# Patient Record
Sex: Female | Born: 1955 | Race: White | Hispanic: No | Marital: Single | Smoking: Never smoker
Health system: Southern US, Community
[De-identification: ages and names within clinical notes are randomized; demographics above are authoritative.]

## PROBLEM LIST (undated history)

## (undated) DIAGNOSIS — M2550 Pain in unspecified joint: Secondary | ICD-10-CM

## (undated) DIAGNOSIS — E039 Hypothyroidism, unspecified: Secondary | ICD-10-CM

## (undated) DIAGNOSIS — E7800 Pure hypercholesterolemia, unspecified: Secondary | ICD-10-CM

## (undated) HISTORY — PX: LASIK: SHX215

## (undated) HISTORY — PX: COLONOSCOPY: SHX174

## (undated) HISTORY — PX: BREAST BIOPSY: SHX20

---

## 1998-06-20 ENCOUNTER — Other Ambulatory Visit: Admission: RE | Admit: 1998-06-20 | Discharge: 1998-06-20 | Payer: Self-pay | Admitting: Obstetrics and Gynecology

## 2000-08-18 ENCOUNTER — Other Ambulatory Visit: Admission: RE | Admit: 2000-08-18 | Discharge: 2000-08-18 | Payer: Self-pay | Admitting: Obstetrics and Gynecology

## 2001-10-31 ENCOUNTER — Other Ambulatory Visit: Admission: RE | Admit: 2001-10-31 | Discharge: 2001-10-31 | Payer: Self-pay | Admitting: Obstetrics and Gynecology

## 2002-01-18 ENCOUNTER — Other Ambulatory Visit: Admission: RE | Admit: 2002-01-18 | Discharge: 2002-01-18 | Payer: Self-pay | Admitting: Obstetrics and Gynecology

## 2003-06-21 ENCOUNTER — Other Ambulatory Visit: Admission: RE | Admit: 2003-06-21 | Discharge: 2003-06-21 | Payer: Self-pay | Admitting: Obstetrics and Gynecology

## 2003-11-08 ENCOUNTER — Other Ambulatory Visit: Admission: RE | Admit: 2003-11-08 | Discharge: 2003-11-08 | Payer: Self-pay | Admitting: Obstetrics and Gynecology

## 2004-12-16 ENCOUNTER — Other Ambulatory Visit: Admission: RE | Admit: 2004-12-16 | Discharge: 2004-12-16 | Payer: Self-pay | Admitting: Obstetrics and Gynecology

## 2011-02-03 ENCOUNTER — Other Ambulatory Visit: Payer: Self-pay | Admitting: Obstetrics and Gynecology

## 2011-02-03 DIAGNOSIS — R928 Other abnormal and inconclusive findings on diagnostic imaging of breast: Secondary | ICD-10-CM

## 2011-02-11 ENCOUNTER — Ambulatory Visit
Admission: RE | Admit: 2011-02-11 | Discharge: 2011-02-11 | Disposition: A | Discharge status: Home / Self Care | Payer: BC Managed Care – PPO | Source: Ambulatory Visit | Attending: Obstetrics and Gynecology | Admitting: Obstetrics and Gynecology

## 2011-02-11 DIAGNOSIS — R928 Other abnormal and inconclusive findings on diagnostic imaging of breast: Secondary | ICD-10-CM

## 2011-02-12 IMAGING — MG MM DIGITAL DIAGNOSTIC UNILAT*R*
2 series · 2 of 2 positions shown · non-contrast
Comparison: [DATE], [DATE], [DATE].

CLINICAL DATA: Called back from screening mammography, possible
right breast mass visualized only on the CC view.

DIGITAL DIAGNOSTIC RIGHT MAMMOGRAM WITH CAD

[R CC]
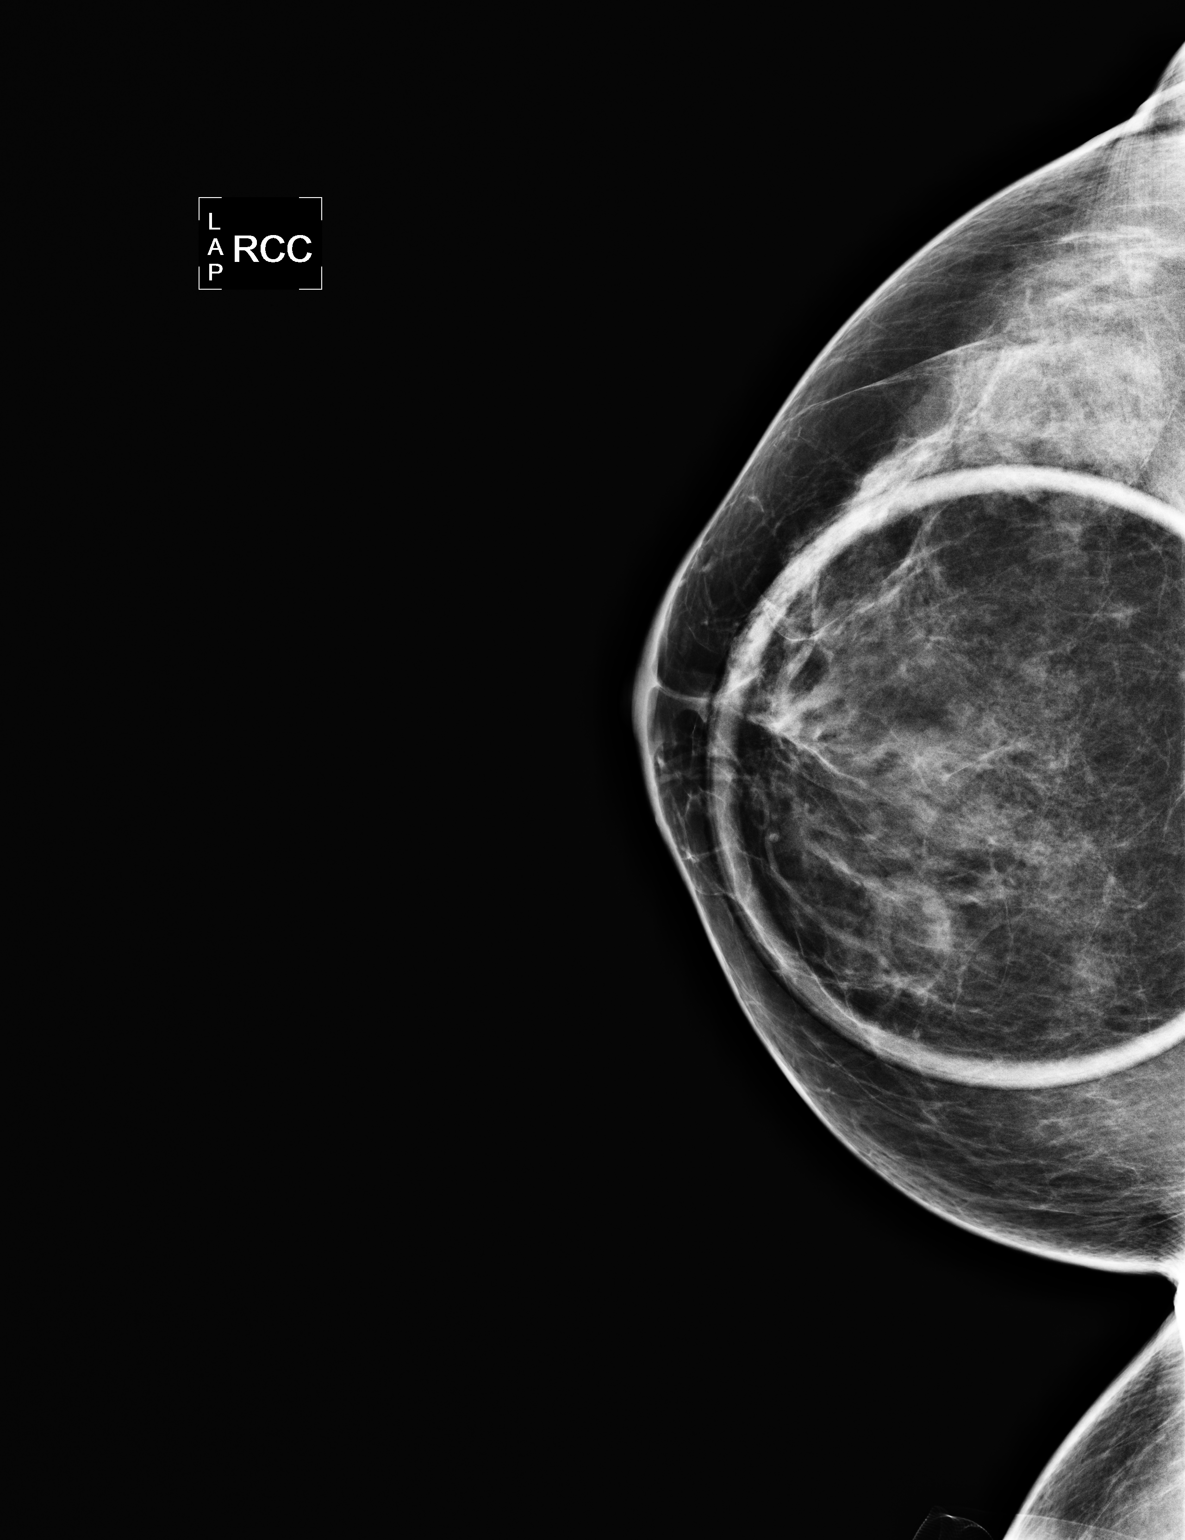

[R ML]
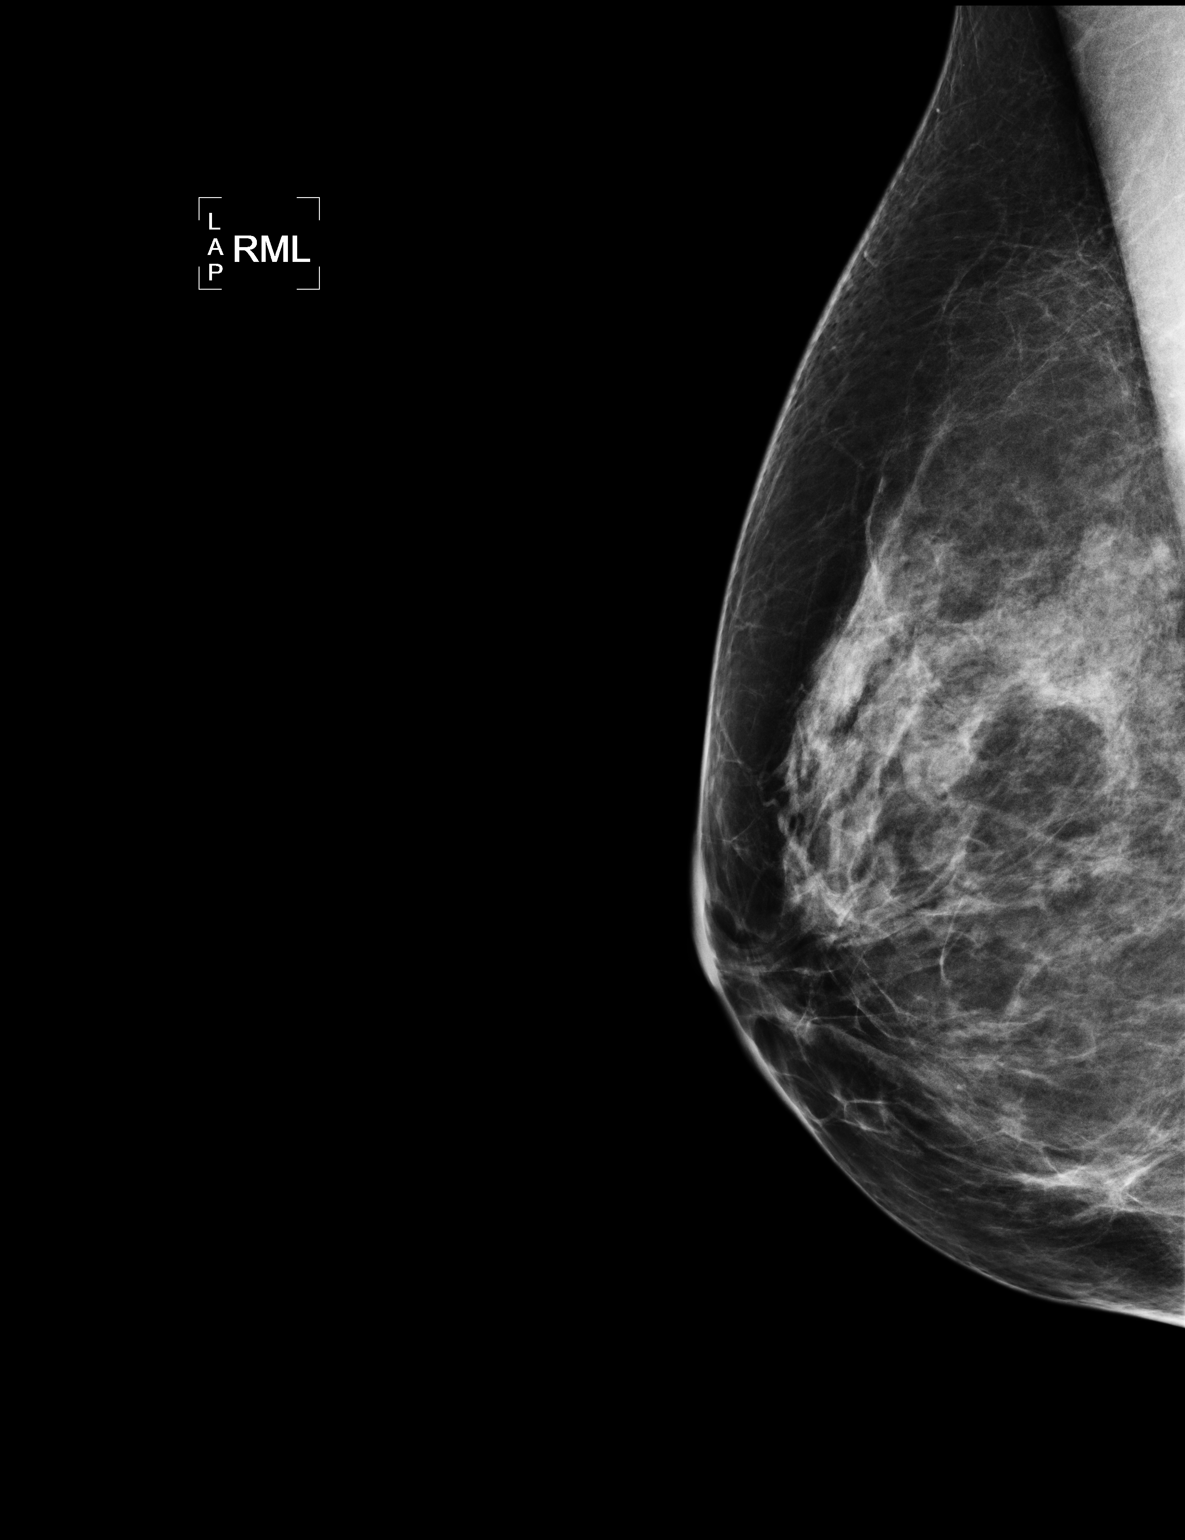

[2 of 2 positions shown; findings below may reference images not displayed]

FINDINGS: Spot compression CC view and a true lateral view of the
right breast were obtained.  Scattered fibroglandular parenchymal
pattern.  No suspicious mass, nonsurgical architectural distortion,
or suspicious calcifications in the right breast.
Mammographic images were processed with CAD.
IMPRESSION: No mammographic evidence of malignancy, right breast.  The
abnormality questioned on the screening mammogram is consistent
with a summation of overlapping fibroglandular tissue.

Recommendation:  Annual bilateral screening mammography in 1 year,
[DATE].

BI-RADS CATEGORY 1:  Negative.

## 2011-11-23 ENCOUNTER — Other Ambulatory Visit: Payer: Self-pay | Admitting: Obstetrics and Gynecology

## 2011-11-23 DIAGNOSIS — Z1231 Encounter for screening mammogram for malignant neoplasm of breast: Secondary | ICD-10-CM

## 2012-01-28 ENCOUNTER — Ambulatory Visit
Admission: RE | Admit: 2012-01-28 | Discharge: 2012-01-28 | Disposition: A | Discharge status: Home / Self Care | Payer: BC Managed Care – PPO | Source: Ambulatory Visit | Attending: Obstetrics and Gynecology | Admitting: Obstetrics and Gynecology

## 2012-01-28 DIAGNOSIS — Z1231 Encounter for screening mammogram for malignant neoplasm of breast: Secondary | ICD-10-CM

## 2012-01-28 IMAGING — MG MM DIGITAL SCREENING BILAT
8 series · 9 of 24 positions shown · non-contrast
Comparison: Previous exams.

CLINICAL DATA: Screening.

DIGITAL BILATERAL SCREENING MAMMOGRAM WITH CAD
 DIGITAL BREAST TOMOSYNTHESIS
Digital breast tomosynthesis images are acquired in two
projections.  These images are reviewed in combination with the
digital mammogram, confirming the findings below.

[R CC]
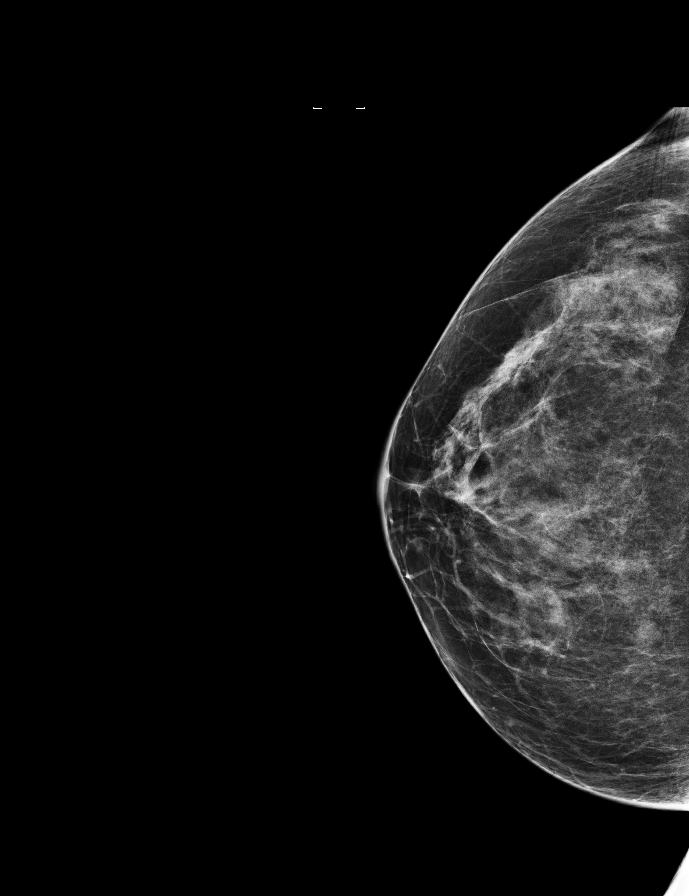

[L MLO]
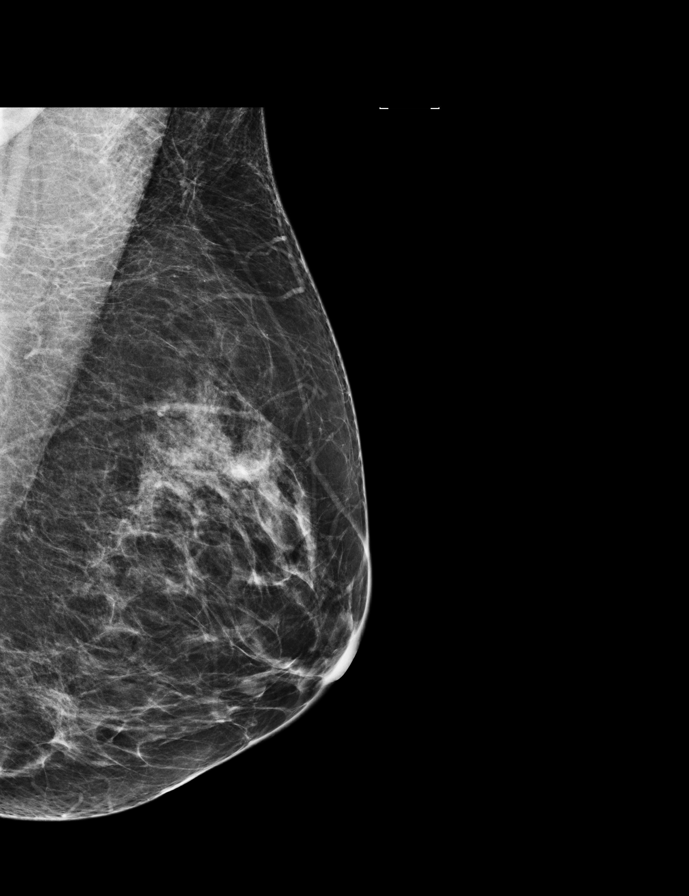

[L CC]
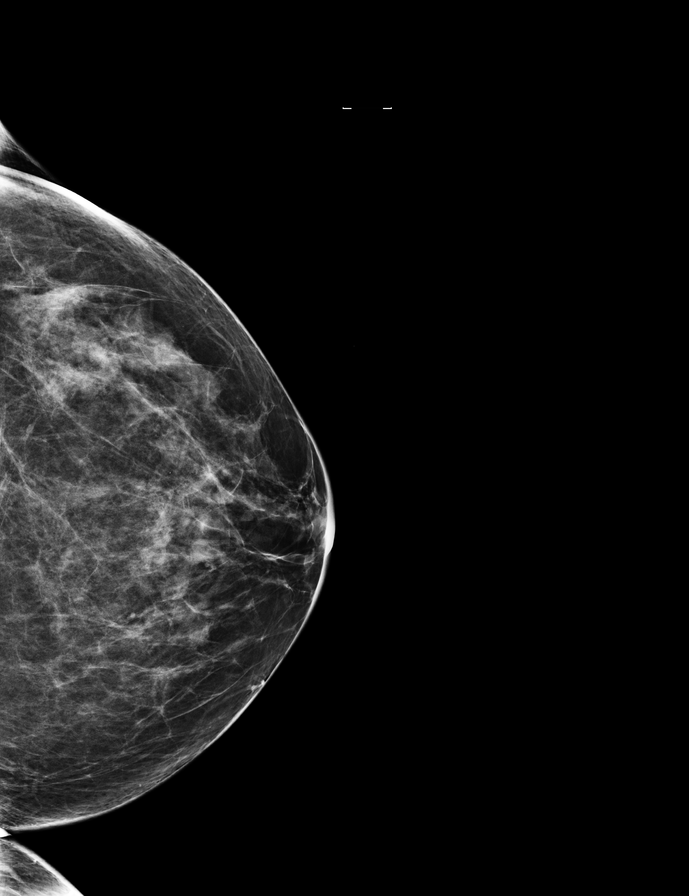

[R MLO]
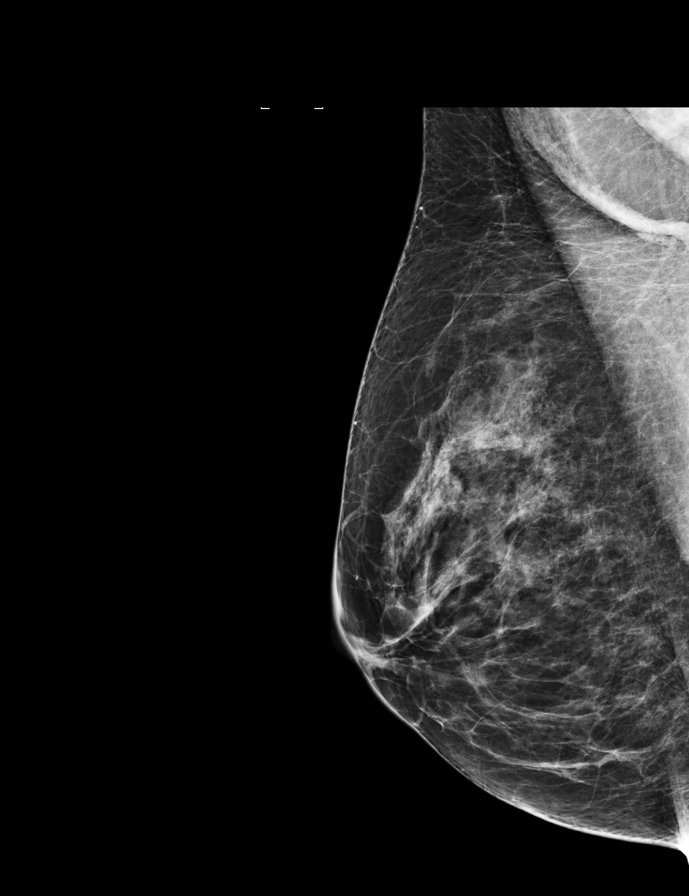

[L MLO tomo · 2 of 67 frames shown]
[frame 22/67]
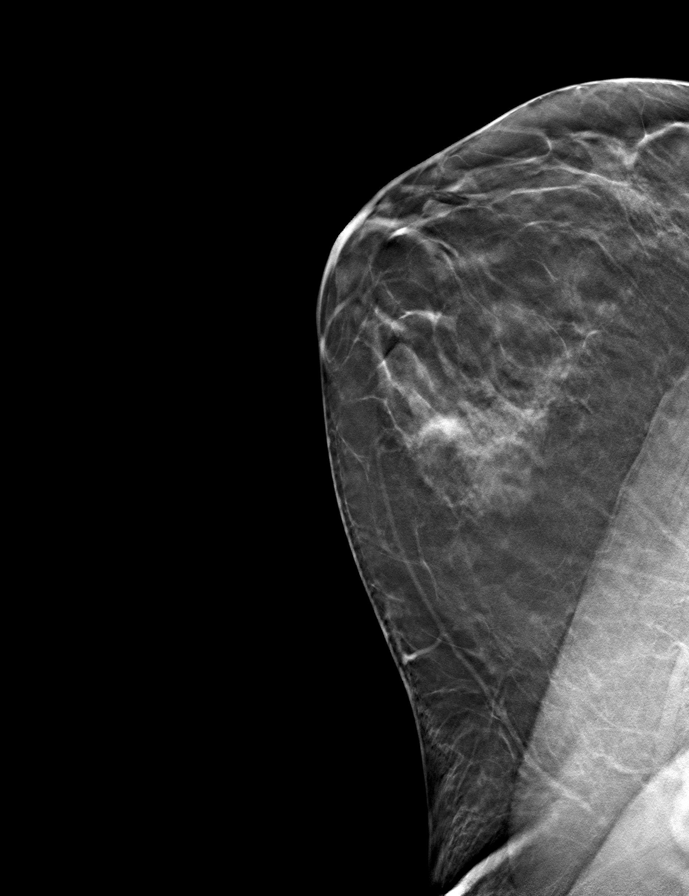
[frame 34/67]
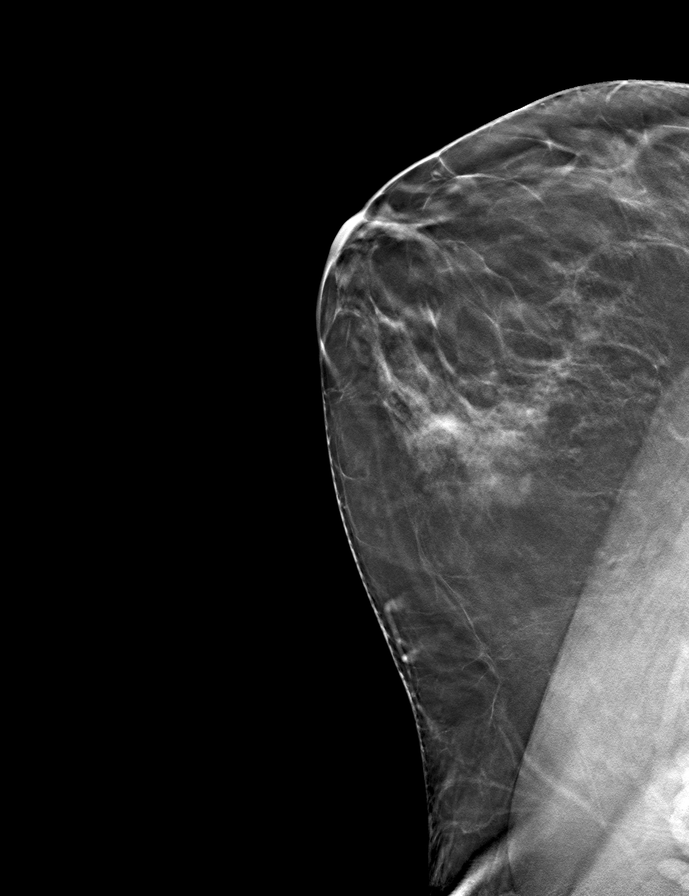

[L CC tomo · tomo slice 39/77.0]
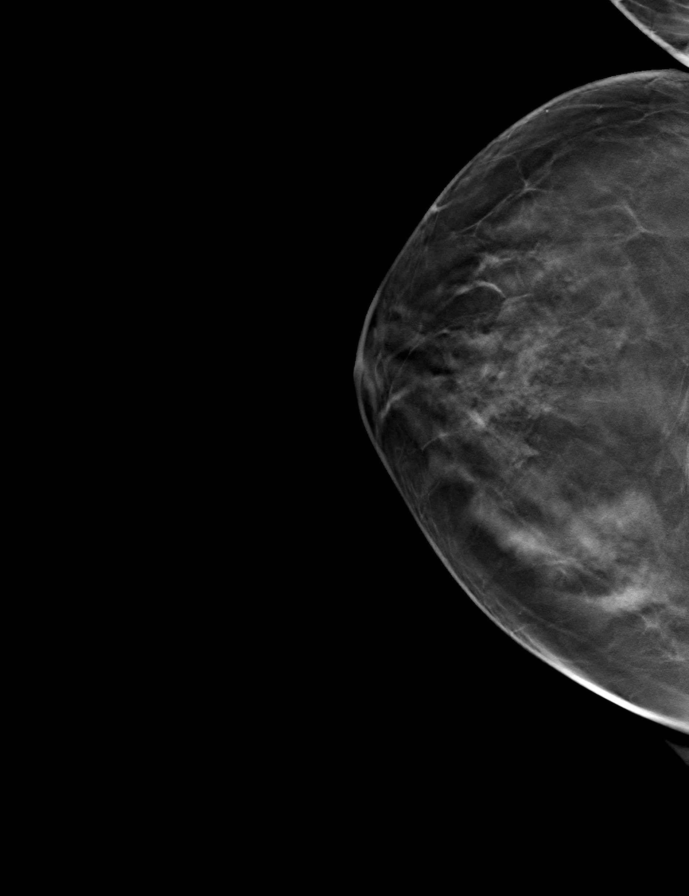

[R MLO tomo · tomo slice 34/67.0]
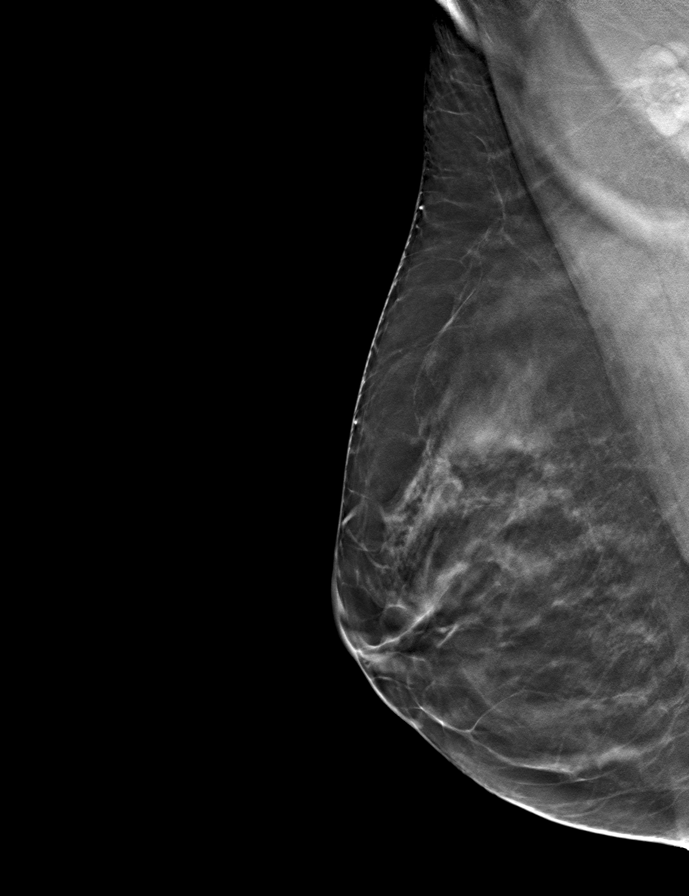

[R CC tomo · tomo slice 35/69.0]
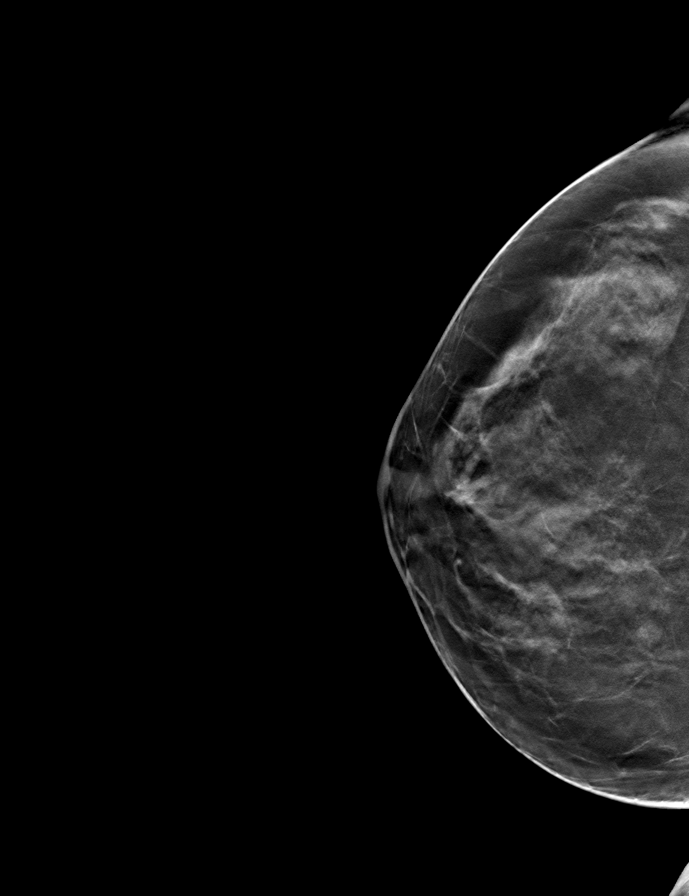

[9 of 24 positions shown; findings below may reference images not displayed]

FINDINGS: ACR Breast Density Category 2: There is a scattered fibroglandular
pattern.

No suspicious masses, architectural distortion, or calcifications
are present.

Images were processed with CAD.
IMPRESSION: No mammographic evidence of malignancy.

A result letter of this screening mammogram will be mailed directly
to the patient.

RECOMMENDATION:
Screening mammogram in one year. (Code:[GZ])

BI-RADS CATEGORY 1:  Negative.

## 2012-12-26 ENCOUNTER — Other Ambulatory Visit: Payer: Self-pay

## 2012-12-26 DIAGNOSIS — Z1231 Encounter for screening mammogram for malignant neoplasm of breast: Secondary | ICD-10-CM

## 2013-01-30 ENCOUNTER — Ambulatory Visit
Admission: RE | Admit: 2013-01-30 | Discharge: 2013-01-30 | Disposition: A | Discharge status: Home / Self Care | Payer: BC Managed Care – PPO | Source: Ambulatory Visit

## 2013-01-30 DIAGNOSIS — Z1231 Encounter for screening mammogram for malignant neoplasm of breast: Secondary | ICD-10-CM

## 2013-02-03 ENCOUNTER — Ambulatory Visit
Admission: RE | Admit: 2013-02-03 | Discharge: 2013-02-03 | Disposition: A | Discharge status: Home / Self Care | Payer: BC Managed Care – PPO | Source: Ambulatory Visit | Attending: Obstetrics and Gynecology | Admitting: Obstetrics and Gynecology

## 2013-02-03 ENCOUNTER — Other Ambulatory Visit: Payer: Self-pay | Admitting: Obstetrics and Gynecology

## 2013-02-03 DIAGNOSIS — R928 Other abnormal and inconclusive findings on diagnostic imaging of breast: Secondary | ICD-10-CM

## 2013-02-03 DIAGNOSIS — N6320 Unspecified lump in the left breast, unspecified quadrant: Secondary | ICD-10-CM

## 2013-06-29 ENCOUNTER — Other Ambulatory Visit: Payer: Self-pay | Admitting: Obstetrics and Gynecology

## 2013-06-29 DIAGNOSIS — N63 Unspecified lump in unspecified breast: Secondary | ICD-10-CM

## 2013-07-13 ENCOUNTER — Encounter (INDEPENDENT_AMBULATORY_CARE_PROVIDER_SITE_OTHER): Payer: Self-pay

## 2013-07-13 ENCOUNTER — Ambulatory Visit
Admission: RE | Admit: 2013-07-13 | Discharge: 2013-07-13 | Disposition: A | Discharge status: Home / Self Care | Payer: BC Managed Care – PPO | Source: Ambulatory Visit | Attending: Obstetrics and Gynecology | Admitting: Obstetrics and Gynecology

## 2013-07-13 DIAGNOSIS — N63 Unspecified lump in unspecified breast: Secondary | ICD-10-CM

## 2013-12-11 ENCOUNTER — Other Ambulatory Visit: Payer: Self-pay

## 2013-12-11 DIAGNOSIS — Z1231 Encounter for screening mammogram for malignant neoplasm of breast: Secondary | ICD-10-CM

## 2014-01-31 ENCOUNTER — Ambulatory Visit
Admission: RE | Admit: 2014-01-31 | Discharge: 2014-01-31 | Disposition: A | Discharge status: Home / Self Care | Payer: 59 | Source: Ambulatory Visit

## 2014-01-31 ENCOUNTER — Encounter (INDEPENDENT_AMBULATORY_CARE_PROVIDER_SITE_OTHER): Payer: Self-pay

## 2014-01-31 DIAGNOSIS — Z1231 Encounter for screening mammogram for malignant neoplasm of breast: Secondary | ICD-10-CM

## 2014-04-05 ENCOUNTER — Other Ambulatory Visit: Payer: Self-pay | Admitting: Obstetrics and Gynecology

## 2014-04-06 LAB — CYTOLOGY - PAP

## 2014-12-24 ENCOUNTER — Other Ambulatory Visit: Payer: Self-pay

## 2014-12-24 DIAGNOSIS — Z1231 Encounter for screening mammogram for malignant neoplasm of breast: Secondary | ICD-10-CM

## 2015-02-04 ENCOUNTER — Ambulatory Visit
Admission: RE | Admit: 2015-02-04 | Discharge: 2015-02-04 | Disposition: A | Discharge status: Home / Self Care | Payer: BLUE CROSS/BLUE SHIELD | Source: Ambulatory Visit

## 2015-02-04 DIAGNOSIS — Z1231 Encounter for screening mammogram for malignant neoplasm of breast: Secondary | ICD-10-CM

## 2015-04-11 ENCOUNTER — Other Ambulatory Visit: Payer: Self-pay | Admitting: Obstetrics and Gynecology

## 2015-04-11 DIAGNOSIS — Z6822 Body mass index (BMI) 22.0-22.9, adult: Secondary | ICD-10-CM | POA: Diagnosis not present

## 2015-04-11 DIAGNOSIS — Z01419 Encounter for gynecological examination (general) (routine) without abnormal findings: Secondary | ICD-10-CM | POA: Diagnosis not present

## 2015-04-11 DIAGNOSIS — Z124 Encounter for screening for malignant neoplasm of cervix: Secondary | ICD-10-CM | POA: Diagnosis not present

## 2015-04-12 LAB — CYTOLOGY - PAP

## 2015-07-11 DIAGNOSIS — Z1211 Encounter for screening for malignant neoplasm of colon: Secondary | ICD-10-CM | POA: Diagnosis not present

## 2015-07-11 DIAGNOSIS — Z86010 Personal history of colonic polyps: Secondary | ICD-10-CM | POA: Diagnosis not present

## 2015-07-11 DIAGNOSIS — K5904 Chronic idiopathic constipation: Secondary | ICD-10-CM | POA: Diagnosis not present

## 2015-08-05 DIAGNOSIS — K5730 Diverticulosis of large intestine without perforation or abscess without bleeding: Secondary | ICD-10-CM | POA: Diagnosis not present

## 2015-08-05 DIAGNOSIS — K648 Other hemorrhoids: Secondary | ICD-10-CM | POA: Diagnosis not present

## 2015-08-05 DIAGNOSIS — Z86010 Personal history of colonic polyps: Secondary | ICD-10-CM | POA: Diagnosis not present

## 2015-08-05 DIAGNOSIS — Q438 Other specified congenital malformations of intestine: Secondary | ICD-10-CM | POA: Diagnosis not present

## 2015-08-05 DIAGNOSIS — Z1211 Encounter for screening for malignant neoplasm of colon: Secondary | ICD-10-CM | POA: Diagnosis not present

## 2015-09-16 DIAGNOSIS — H2513 Age-related nuclear cataract, bilateral: Secondary | ICD-10-CM | POA: Diagnosis not present

## 2015-11-08 DIAGNOSIS — Z23 Encounter for immunization: Secondary | ICD-10-CM | POA: Diagnosis not present

## 2016-01-14 ENCOUNTER — Other Ambulatory Visit: Payer: Self-pay | Admitting: Obstetrics and Gynecology

## 2016-01-14 DIAGNOSIS — Z1231 Encounter for screening mammogram for malignant neoplasm of breast: Secondary | ICD-10-CM

## 2016-02-17 ENCOUNTER — Ambulatory Visit
Admission: RE | Admit: 2016-02-17 | Discharge: 2016-02-17 | Disposition: A | Discharge status: Home / Self Care | Payer: BLUE CROSS/BLUE SHIELD | Source: Ambulatory Visit | Attending: Obstetrics and Gynecology | Admitting: Obstetrics and Gynecology

## 2016-02-17 DIAGNOSIS — Z1231 Encounter for screening mammogram for malignant neoplasm of breast: Secondary | ICD-10-CM | POA: Diagnosis not present

## 2016-02-17 IMAGING — MG 2D DIGITAL SCREENING BILATERAL MAMMOGRAM WITH CAD AND ADJUNCT TO
9 of 12 series · 9 of 28 positions shown · non-contrast
Comparison: Previous exam(s).

CLINICAL DATA: Screening.

EXAM:
2D DIGITAL SCREENING BILATERAL MAMMOGRAM WITH CAD AND ADJUNCT TOMO

[L MLO synth-2D]
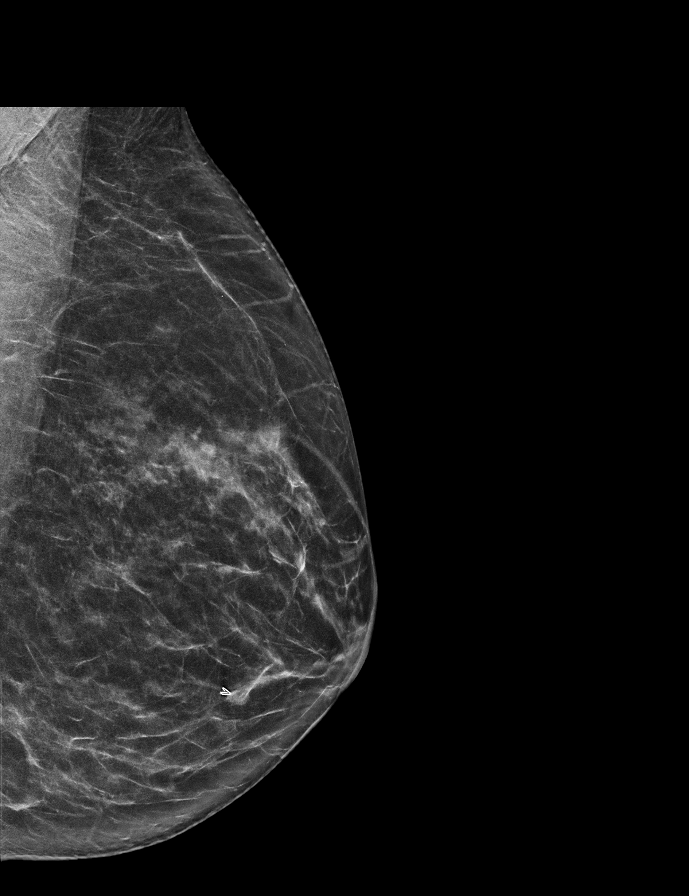

[R MLO]
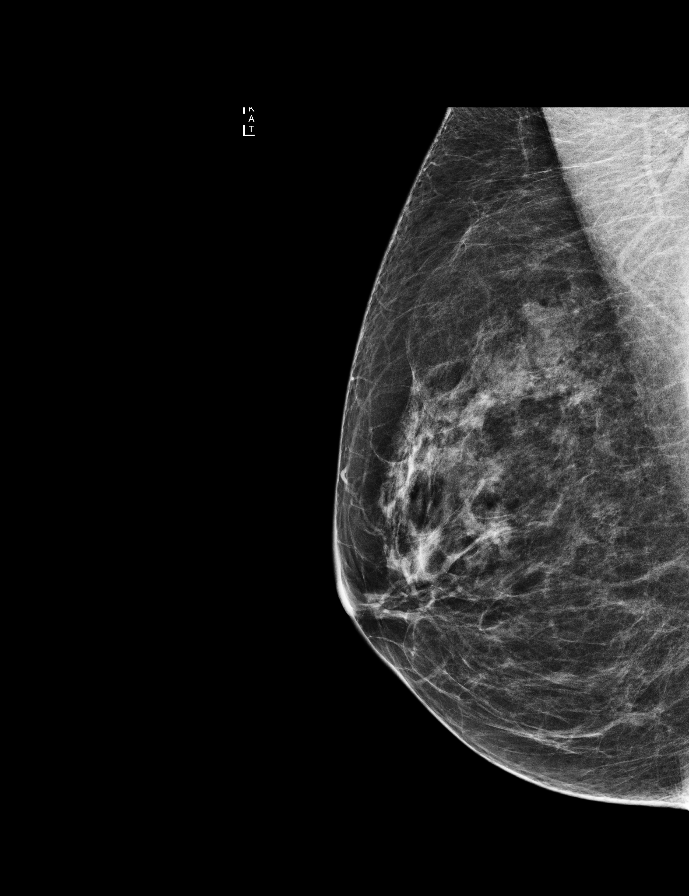

[R CC synth-2D]
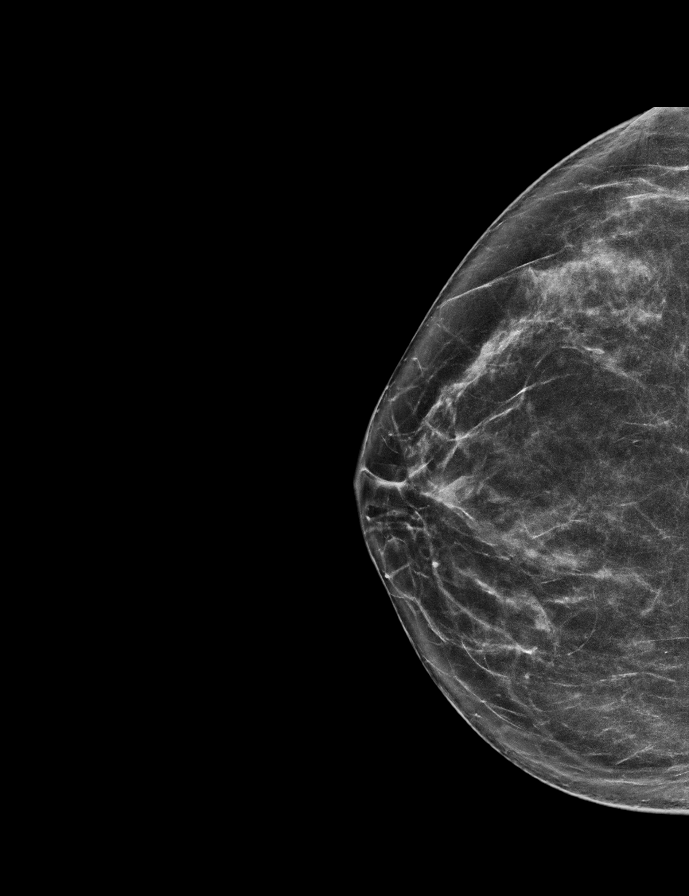

[L CC synth-2D]
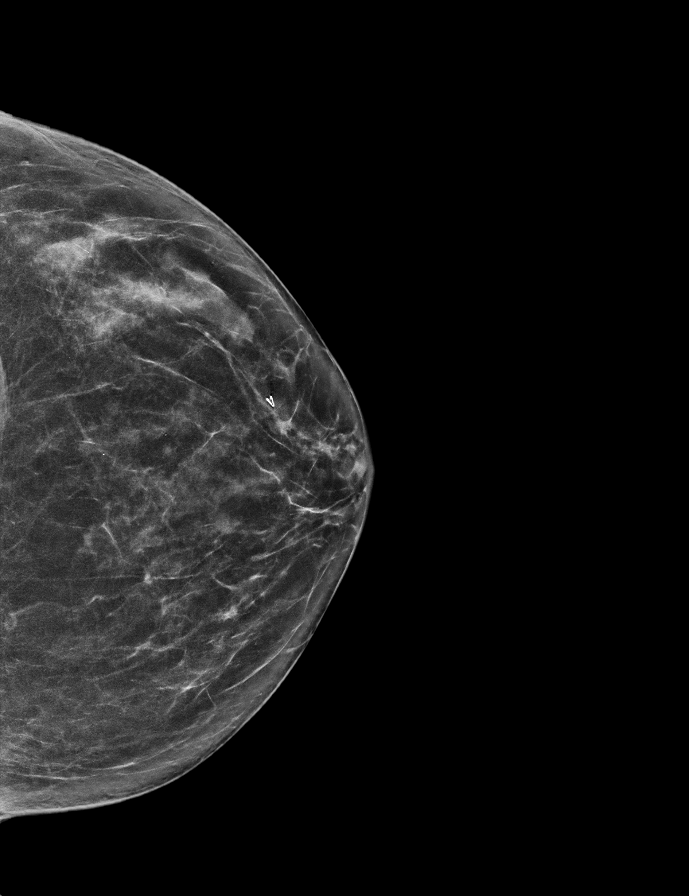

[R MLO synth-2D]
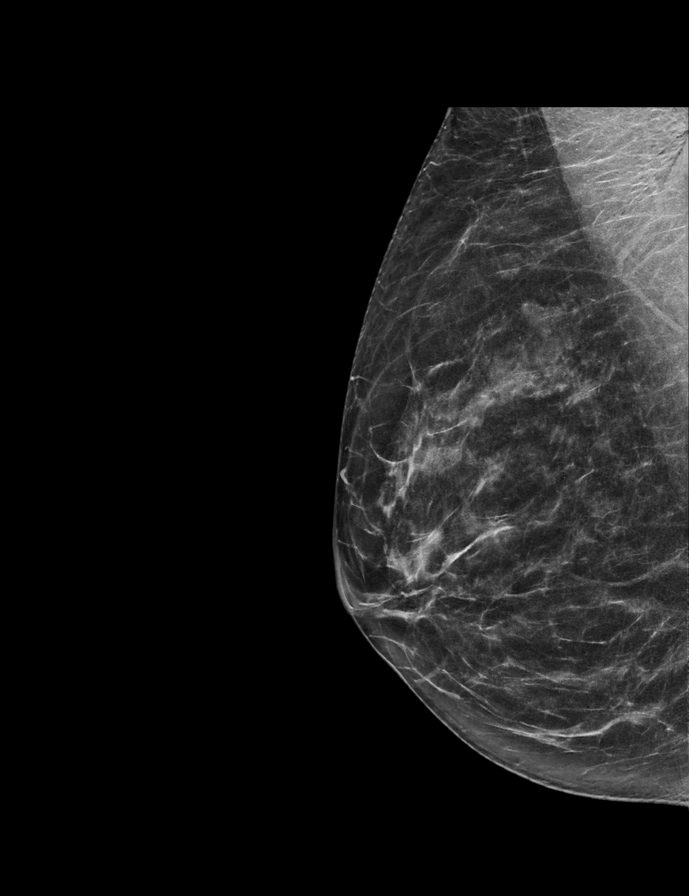

[L CC]
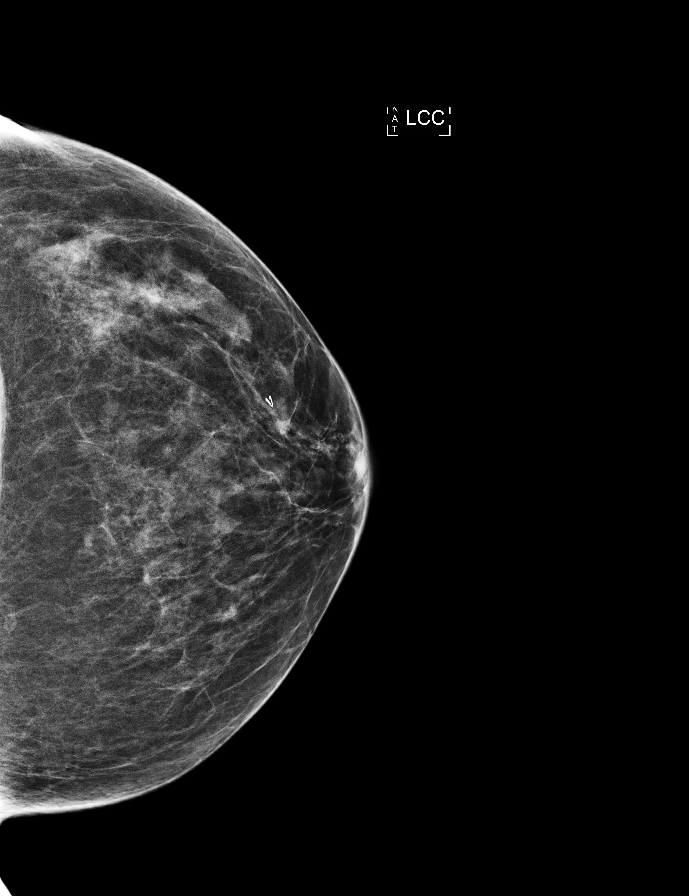

[R CC]
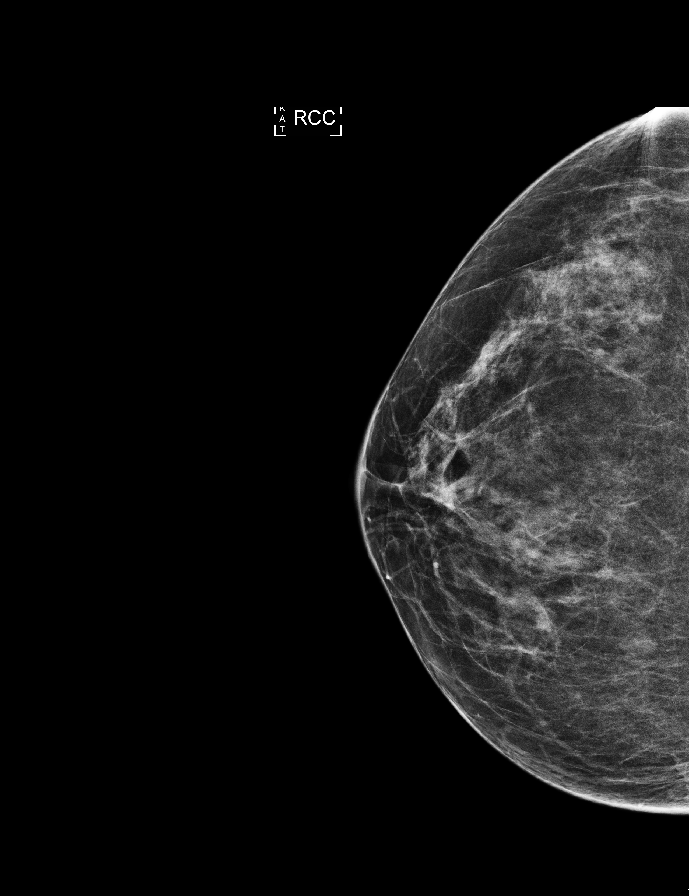

[L MLO]
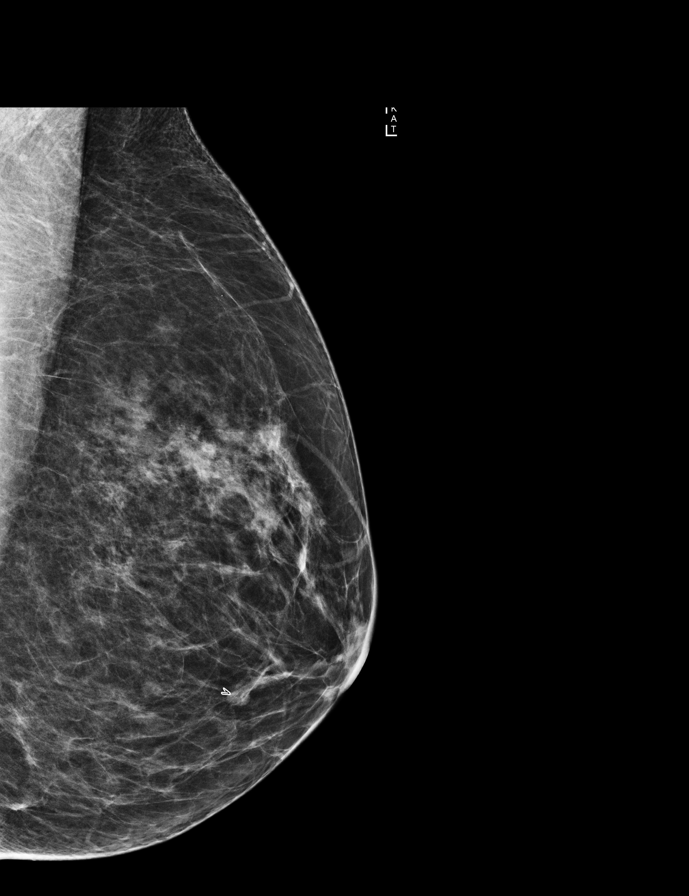

[R CC tomo · tomo slice 34/67.0]
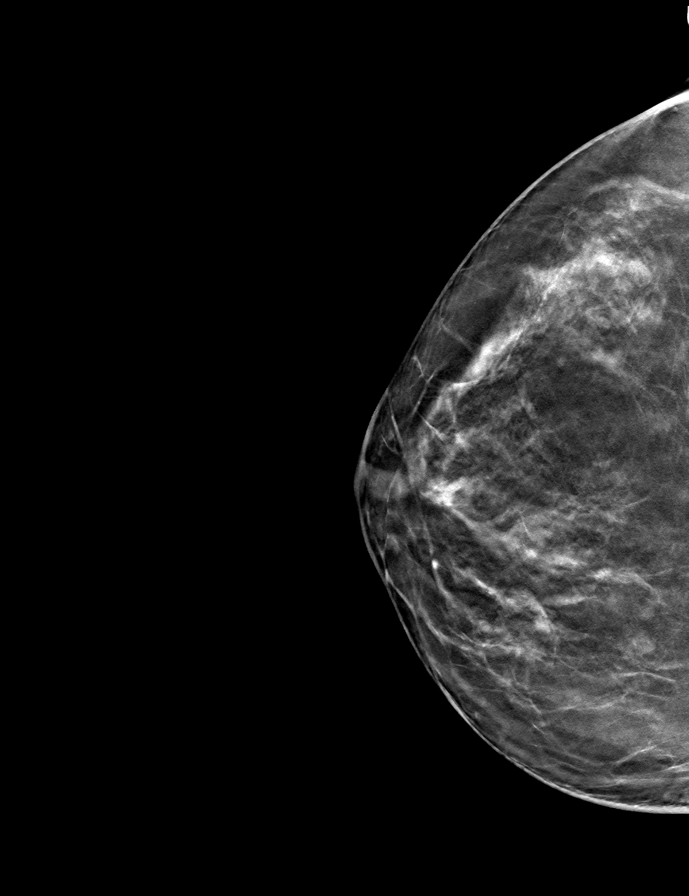

[9 of 28 positions shown; findings below may reference images not displayed]

ACR Breast Density Category b: There are scattered areas of
fibroglandular density.
FINDINGS: There are no findings suspicious for malignancy. Images were
processed with CAD.
IMPRESSION: No mammographic evidence of malignancy. A result letter of this
screening mammogram will be mailed directly to the patient.

RECOMMENDATION:
Screening mammogram in one year. (Code:[33])

BI-RADS CATEGORY  1: Negative.

## 2016-05-27 DIAGNOSIS — S51012A Laceration without foreign body of left elbow, initial encounter: Secondary | ICD-10-CM | POA: Diagnosis not present

## 2016-05-27 DIAGNOSIS — Z23 Encounter for immunization: Secondary | ICD-10-CM | POA: Diagnosis not present

## 2016-05-27 DIAGNOSIS — W11XXXA Fall on and from ladder, initial encounter: Secondary | ICD-10-CM | POA: Diagnosis not present

## 2016-05-27 DIAGNOSIS — S91312A Laceration without foreign body, left foot, initial encounter: Secondary | ICD-10-CM | POA: Diagnosis not present

## 2016-05-28 DIAGNOSIS — Z6822 Body mass index (BMI) 22.0-22.9, adult: Secondary | ICD-10-CM | POA: Diagnosis not present

## 2016-05-28 DIAGNOSIS — Z01419 Encounter for gynecological examination (general) (routine) without abnormal findings: Secondary | ICD-10-CM | POA: Diagnosis not present

## 2016-08-31 DIAGNOSIS — E785 Hyperlipidemia, unspecified: Secondary | ICD-10-CM | POA: Diagnosis not present

## 2016-08-31 DIAGNOSIS — M79641 Pain in right hand: Secondary | ICD-10-CM | POA: Diagnosis not present

## 2016-08-31 DIAGNOSIS — K219 Gastro-esophageal reflux disease without esophagitis: Secondary | ICD-10-CM | POA: Diagnosis not present

## 2016-08-31 DIAGNOSIS — J302 Other seasonal allergic rhinitis: Secondary | ICD-10-CM | POA: Diagnosis not present

## 2016-08-31 DIAGNOSIS — E039 Hypothyroidism, unspecified: Secondary | ICD-10-CM | POA: Diagnosis not present

## 2016-09-24 DIAGNOSIS — M79642 Pain in left hand: Secondary | ICD-10-CM | POA: Diagnosis not present

## 2016-09-24 DIAGNOSIS — M180 Bilateral primary osteoarthritis of first carpometacarpal joints: Secondary | ICD-10-CM | POA: Diagnosis not present

## 2016-09-24 DIAGNOSIS — M79641 Pain in right hand: Secondary | ICD-10-CM | POA: Diagnosis not present

## 2016-10-13 DIAGNOSIS — H2511 Age-related nuclear cataract, right eye: Secondary | ICD-10-CM | POA: Diagnosis not present

## 2016-10-28 DIAGNOSIS — Z23 Encounter for immunization: Secondary | ICD-10-CM | POA: Diagnosis not present

## 2016-12-23 DIAGNOSIS — J302 Other seasonal allergic rhinitis: Secondary | ICD-10-CM | POA: Diagnosis not present

## 2017-03-30 ENCOUNTER — Other Ambulatory Visit: Payer: Self-pay | Admitting: Obstetrics and Gynecology

## 2017-03-30 DIAGNOSIS — Z1231 Encounter for screening mammogram for malignant neoplasm of breast: Secondary | ICD-10-CM

## 2017-04-20 DIAGNOSIS — E039 Hypothyroidism, unspecified: Secondary | ICD-10-CM | POA: Diagnosis not present

## 2017-04-20 DIAGNOSIS — Z1331 Encounter for screening for depression: Secondary | ICD-10-CM | POA: Diagnosis not present

## 2017-04-20 DIAGNOSIS — J302 Other seasonal allergic rhinitis: Secondary | ICD-10-CM | POA: Diagnosis not present

## 2017-04-20 DIAGNOSIS — E785 Hyperlipidemia, unspecified: Secondary | ICD-10-CM | POA: Diagnosis not present

## 2017-04-20 DIAGNOSIS — K219 Gastro-esophageal reflux disease without esophagitis: Secondary | ICD-10-CM | POA: Diagnosis not present

## 2017-04-29 ENCOUNTER — Ambulatory Visit: Payer: BLUE CROSS/BLUE SHIELD

## 2017-05-05 ENCOUNTER — Other Ambulatory Visit: Payer: Self-pay | Admitting: Internal Medicine

## 2017-05-05 DIAGNOSIS — R5381 Other malaise: Secondary | ICD-10-CM

## 2017-05-06 ENCOUNTER — Other Ambulatory Visit: Payer: Self-pay | Admitting: Internal Medicine

## 2017-05-06 DIAGNOSIS — M8589 Other specified disorders of bone density and structure, multiple sites: Secondary | ICD-10-CM

## 2017-06-16 ENCOUNTER — Ambulatory Visit
Admission: RE | Admit: 2017-06-16 | Discharge: 2017-06-16 | Disposition: A | Discharge status: Home / Self Care | Payer: BLUE CROSS/BLUE SHIELD | Source: Ambulatory Visit | Attending: Obstetrics and Gynecology | Admitting: Obstetrics and Gynecology

## 2017-06-16 ENCOUNTER — Ambulatory Visit
Admission: RE | Admit: 2017-06-16 | Discharge: 2017-06-16 | Disposition: A | Discharge status: Home / Self Care | Payer: BLUE CROSS/BLUE SHIELD | Source: Ambulatory Visit | Attending: Internal Medicine | Admitting: Internal Medicine

## 2017-06-16 DIAGNOSIS — Z1231 Encounter for screening mammogram for malignant neoplasm of breast: Secondary | ICD-10-CM | POA: Diagnosis not present

## 2017-06-16 DIAGNOSIS — Z780 Asymptomatic menopausal state: Secondary | ICD-10-CM | POA: Diagnosis not present

## 2017-06-16 DIAGNOSIS — M8589 Other specified disorders of bone density and structure, multiple sites: Secondary | ICD-10-CM

## 2017-06-16 DIAGNOSIS — M85852 Other specified disorders of bone density and structure, left thigh: Secondary | ICD-10-CM | POA: Diagnosis not present

## 2017-06-16 IMAGING — MG DIGITAL SCREENING BILATERAL MAMMOGRAM WITH TOMO AND CAD
8 series · 9 of 24 positions shown · non-contrast
Comparison: Previous exam(s).

CLINICAL DATA: Screening.

EXAM:
DIGITAL SCREENING BILATERAL MAMMOGRAM WITH TOMO AND CAD

[R CC synth-2D]
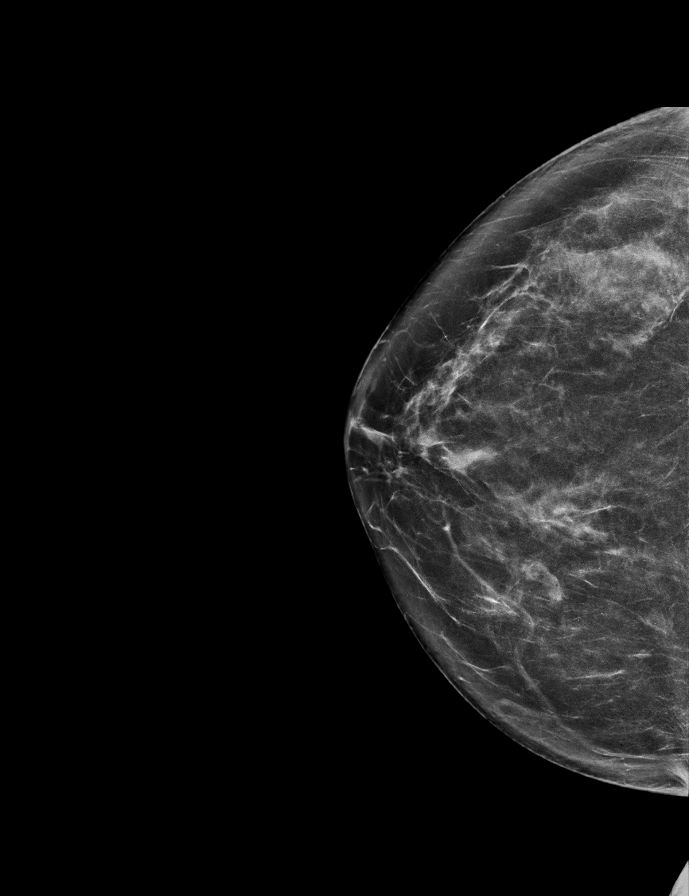

[L MLO synth-2D]
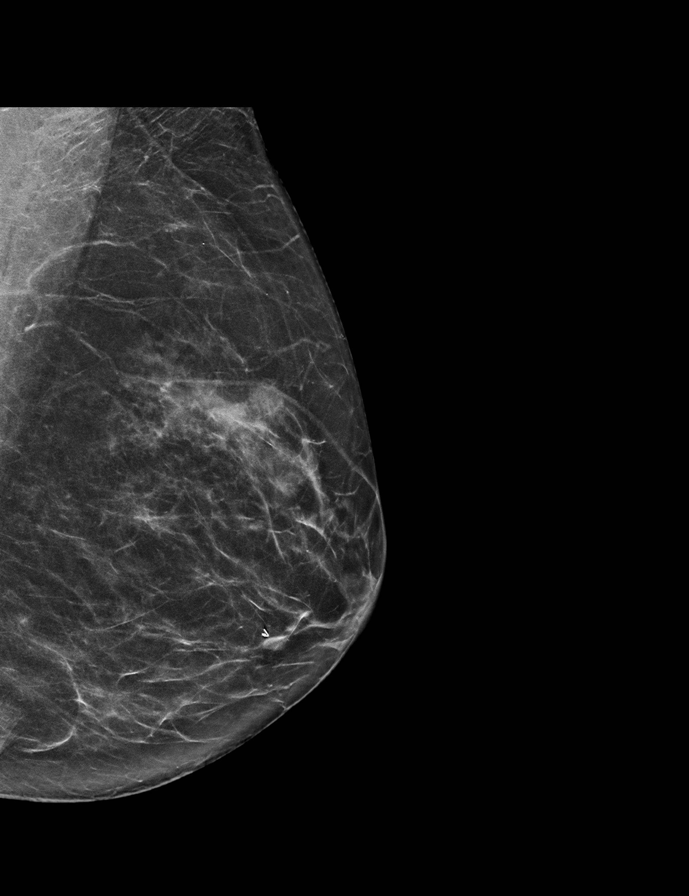

[L CC synth-2D]
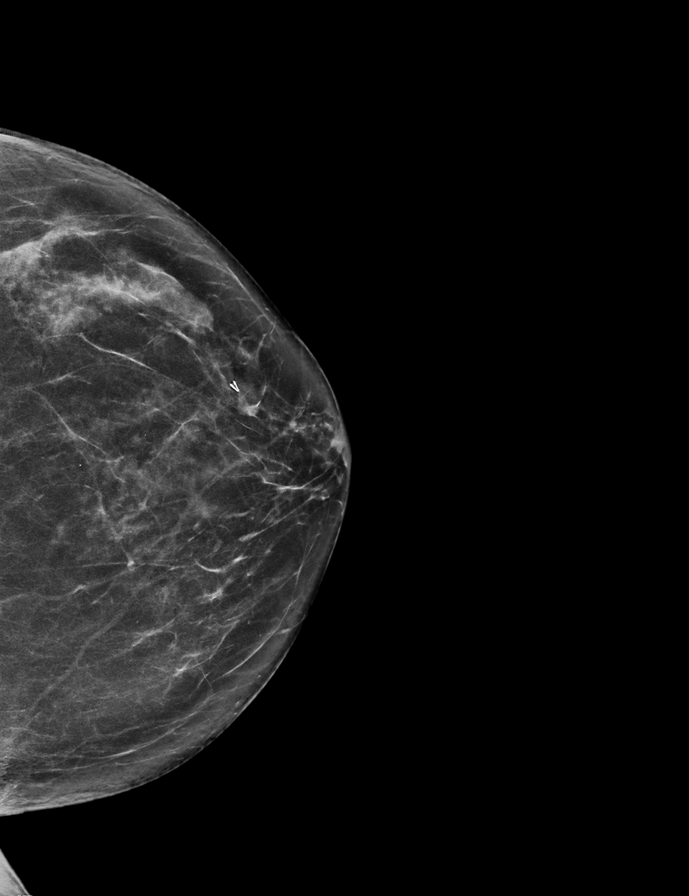

[R MLO synth-2D]
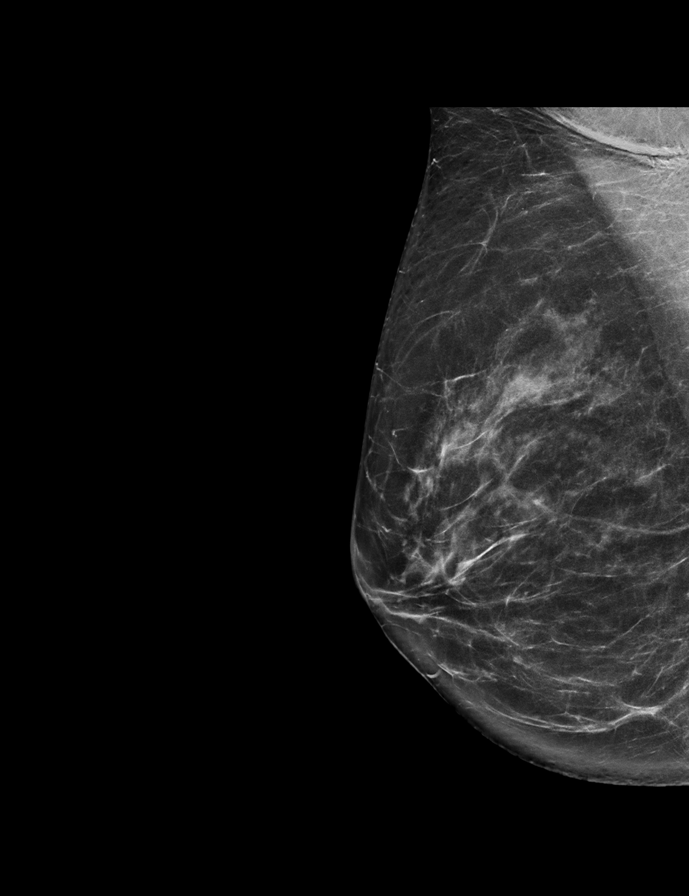

[L CC tomo · 2 of 66 frames shown]
[frame 22/66]
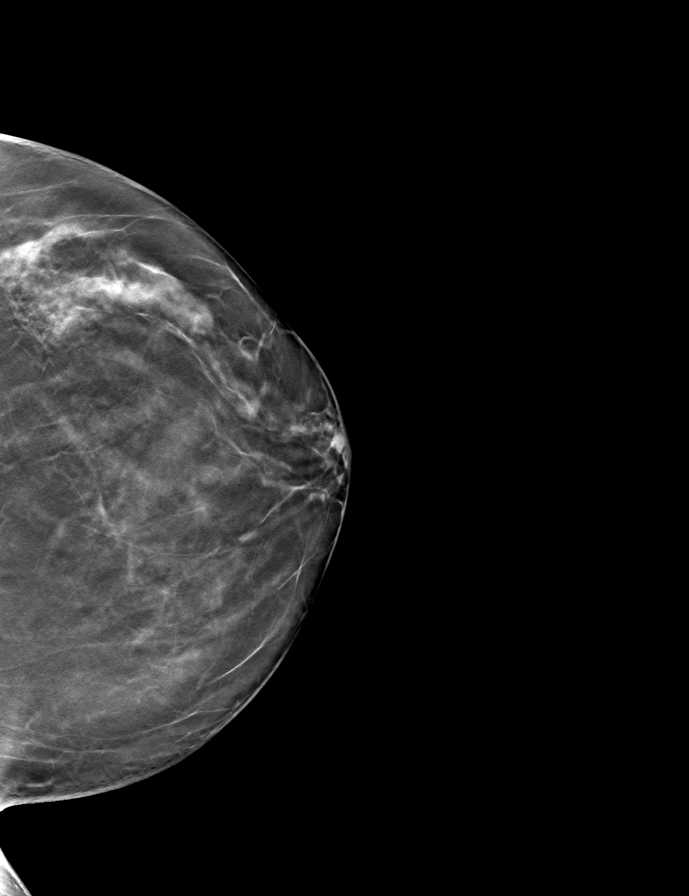
[frame 33/66]
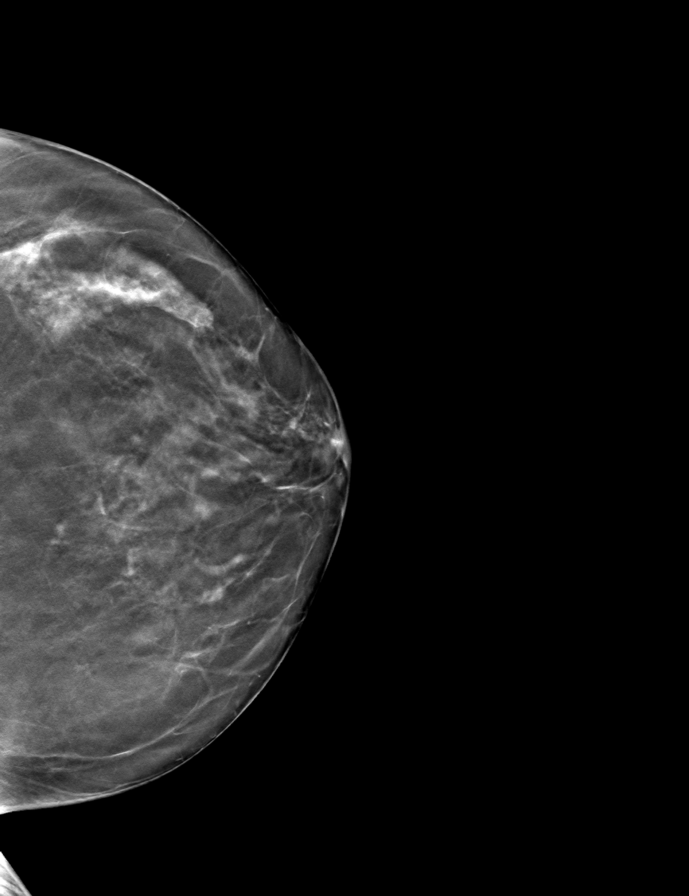

[R CC tomo · tomo slice 35/70.0]
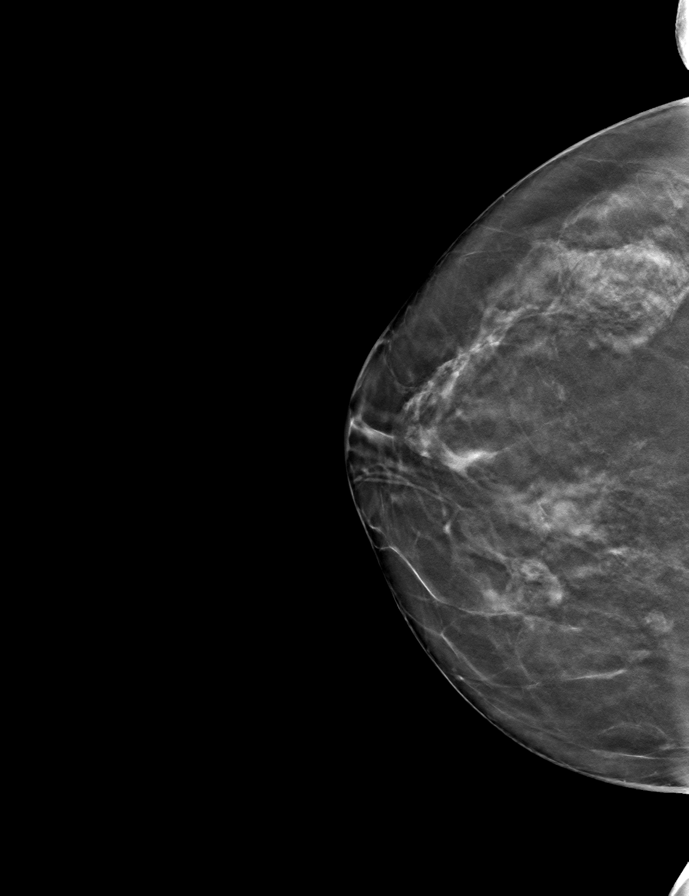

[R MLO tomo · tomo slice 35/68.0]
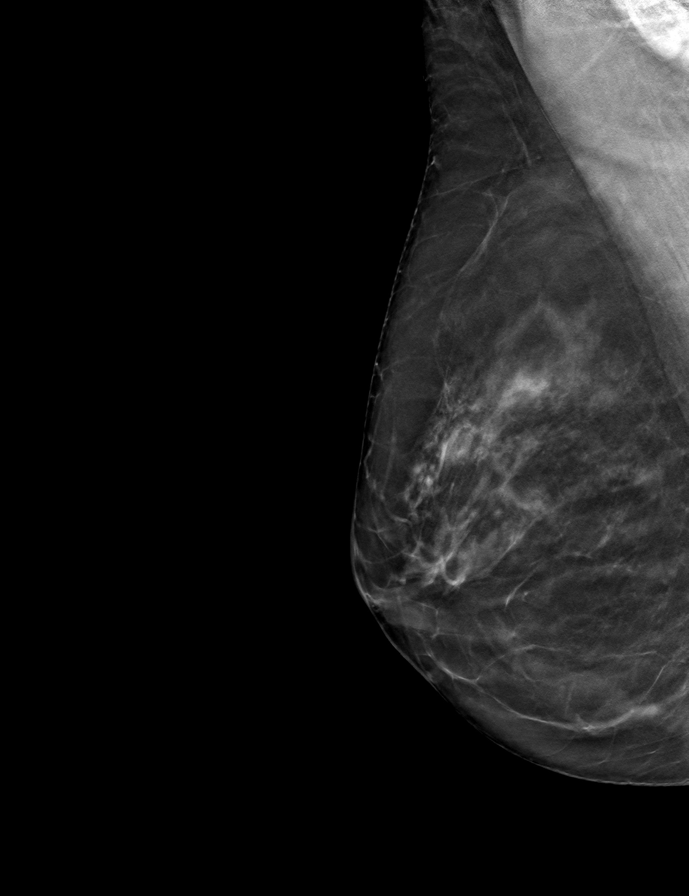

[L MLO tomo · tomo slice 32/63.0]
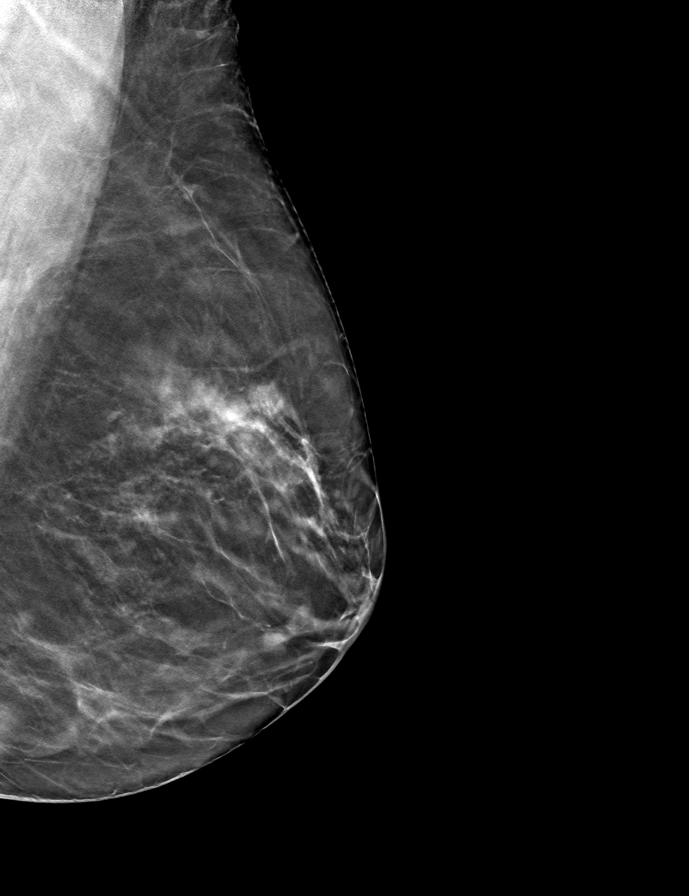

[9 of 24 positions shown; findings below may reference images not displayed]

ACR Breast Density Category b: There are scattered areas of
fibroglandular density.
FINDINGS: There are no findings suspicious for malignancy. Images were
processed with CAD.
IMPRESSION: No mammographic evidence of malignancy. A result letter of this
screening mammogram will be mailed directly to the patient.

RECOMMENDATION:
Screening mammogram in one year. (Code:[TQ])

BI-RADS CATEGORY  1: Negative.

## 2017-06-22 DIAGNOSIS — Z124 Encounter for screening for malignant neoplasm of cervix: Secondary | ICD-10-CM | POA: Diagnosis not present

## 2017-06-22 DIAGNOSIS — Z6822 Body mass index (BMI) 22.0-22.9, adult: Secondary | ICD-10-CM | POA: Diagnosis not present

## 2017-06-22 DIAGNOSIS — Z01419 Encounter for gynecological examination (general) (routine) without abnormal findings: Secondary | ICD-10-CM | POA: Diagnosis not present

## 2017-09-07 DIAGNOSIS — L089 Local infection of the skin and subcutaneous tissue, unspecified: Secondary | ICD-10-CM | POA: Diagnosis not present

## 2017-09-07 DIAGNOSIS — H0013 Chalazion right eye, unspecified eyelid: Secondary | ICD-10-CM | POA: Diagnosis not present

## 2017-09-08 DIAGNOSIS — H0011 Chalazion right upper eyelid: Secondary | ICD-10-CM | POA: Diagnosis not present

## 2017-10-15 DIAGNOSIS — Z23 Encounter for immunization: Secondary | ICD-10-CM | POA: Diagnosis not present

## 2018-01-11 DIAGNOSIS — H268 Other specified cataract: Secondary | ICD-10-CM | POA: Diagnosis not present

## 2018-07-11 ENCOUNTER — Other Ambulatory Visit: Payer: Self-pay | Admitting: Obstetrics and Gynecology

## 2018-07-11 DIAGNOSIS — Z1231 Encounter for screening mammogram for malignant neoplasm of breast: Secondary | ICD-10-CM

## 2018-07-15 DIAGNOSIS — Z1331 Encounter for screening for depression: Secondary | ICD-10-CM | POA: Diagnosis not present

## 2018-07-15 DIAGNOSIS — K449 Diaphragmatic hernia without obstruction or gangrene: Secondary | ICD-10-CM | POA: Diagnosis not present

## 2018-07-15 DIAGNOSIS — M7711 Lateral epicondylitis, right elbow: Secondary | ICD-10-CM | POA: Diagnosis not present

## 2018-07-15 DIAGNOSIS — K219 Gastro-esophageal reflux disease without esophagitis: Secondary | ICD-10-CM | POA: Diagnosis not present

## 2018-07-15 DIAGNOSIS — E785 Hyperlipidemia, unspecified: Secondary | ICD-10-CM | POA: Diagnosis not present

## 2018-07-15 DIAGNOSIS — E039 Hypothyroidism, unspecified: Secondary | ICD-10-CM | POA: Diagnosis not present

## 2018-07-16 DIAGNOSIS — K2980 Duodenitis without bleeding: Secondary | ICD-10-CM | POA: Diagnosis not present

## 2018-08-24 ENCOUNTER — Other Ambulatory Visit: Payer: Self-pay

## 2018-08-24 ENCOUNTER — Ambulatory Visit
Admission: RE | Admit: 2018-08-24 | Discharge: 2018-08-24 | Disposition: A | Discharge status: Home / Self Care | Payer: BC Managed Care – PPO | Source: Ambulatory Visit | Attending: Obstetrics and Gynecology | Admitting: Obstetrics and Gynecology

## 2018-08-24 DIAGNOSIS — Z1231 Encounter for screening mammogram for malignant neoplasm of breast: Secondary | ICD-10-CM

## 2018-08-24 IMAGING — MG DIGITAL SCREENING BILATERAL MAMMOGRAM WITH TOMO AND CAD
8 series · 8 of 24 positions shown · non-contrast
Comparison: Previous exam(s).

CLINICAL DATA: Screening.

EXAM:
DIGITAL SCREENING BILATERAL MAMMOGRAM WITH TOMO AND CAD

[R MLO synth-2D]
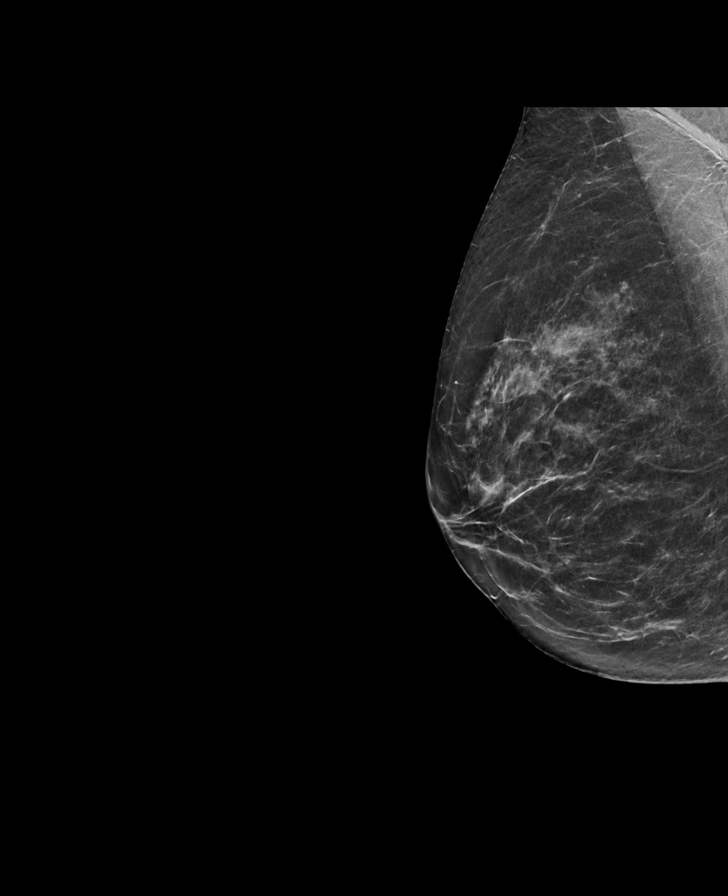

[R CC synth-2D]
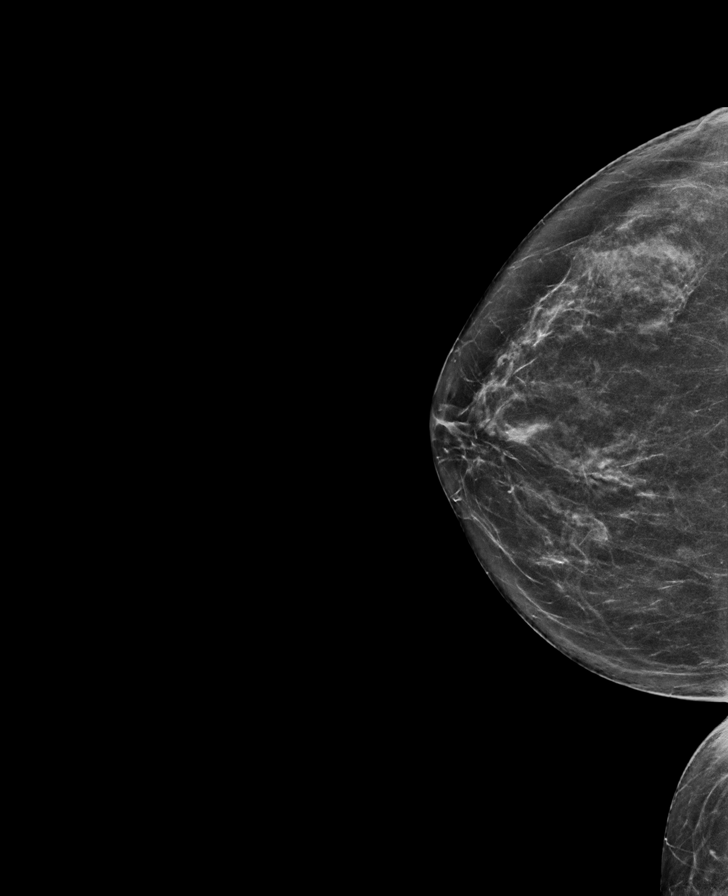

[L CC synth-2D]
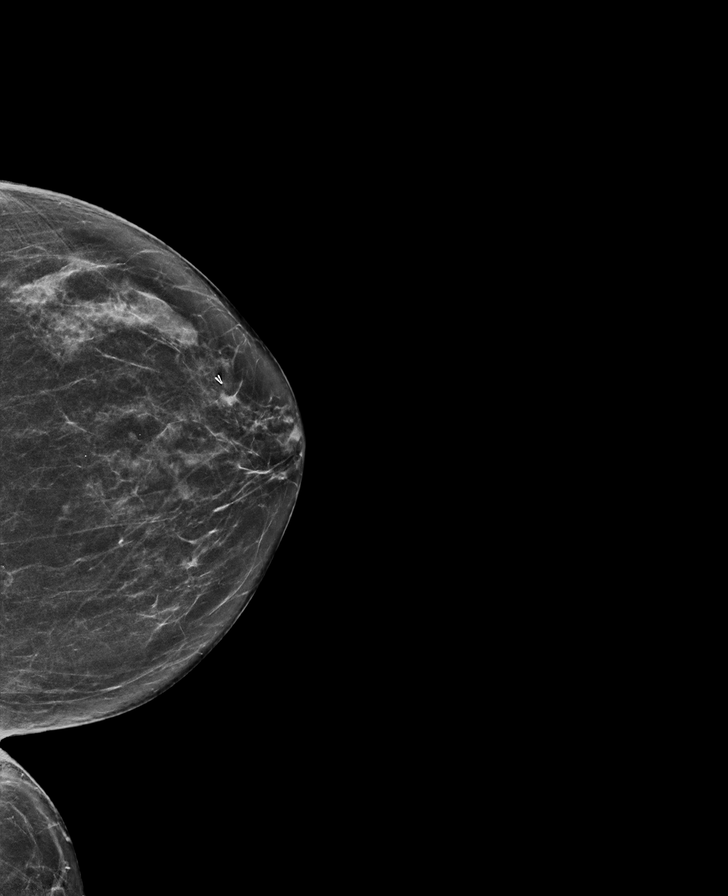

[L MLO synth-2D]
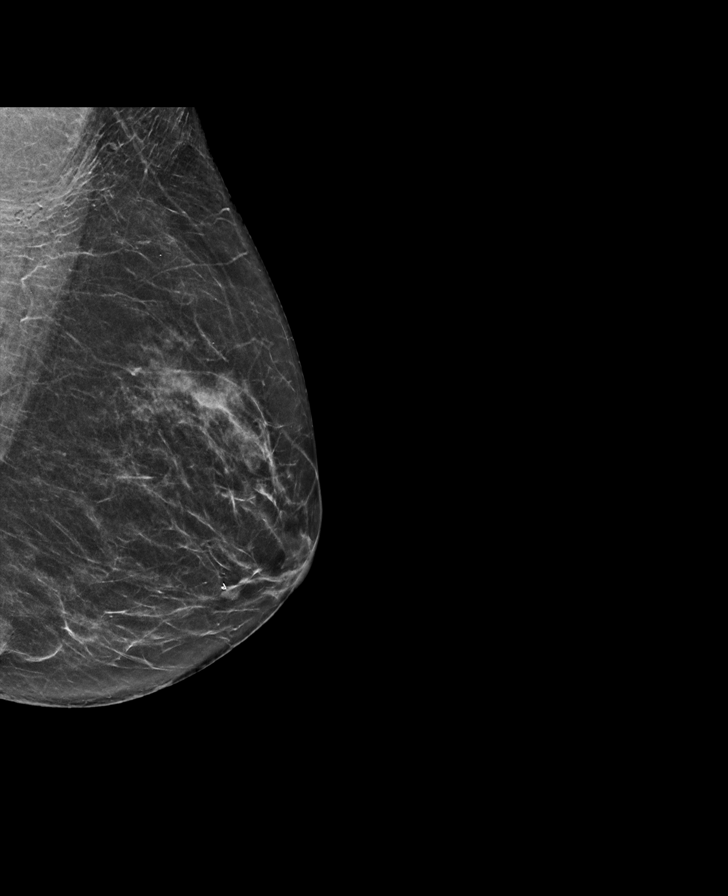

[L MLO tomo · tomo slice 31/62.0]
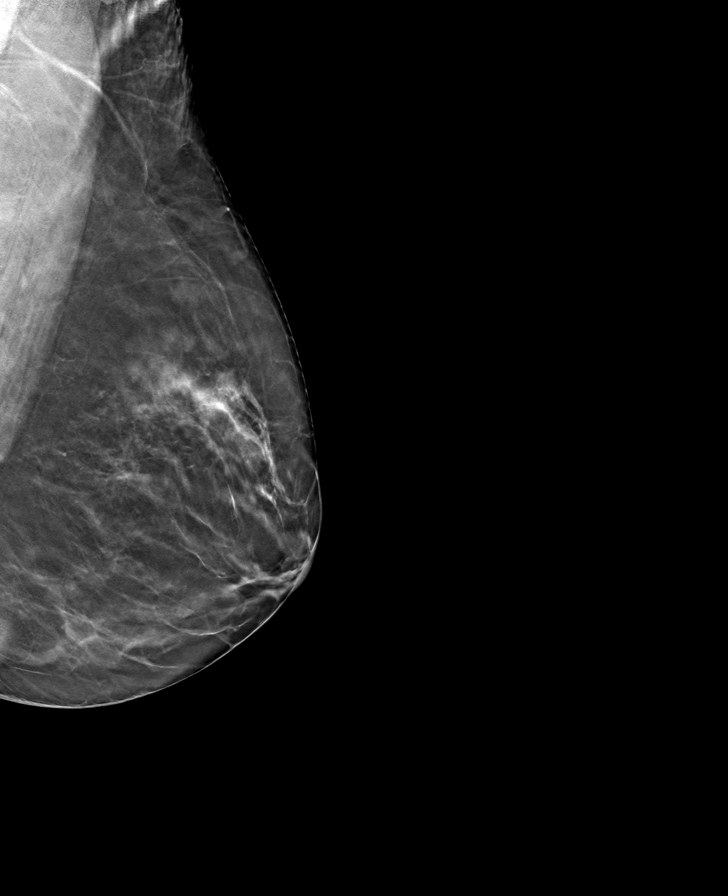

[R CC tomo · tomo slice 33/64.0]
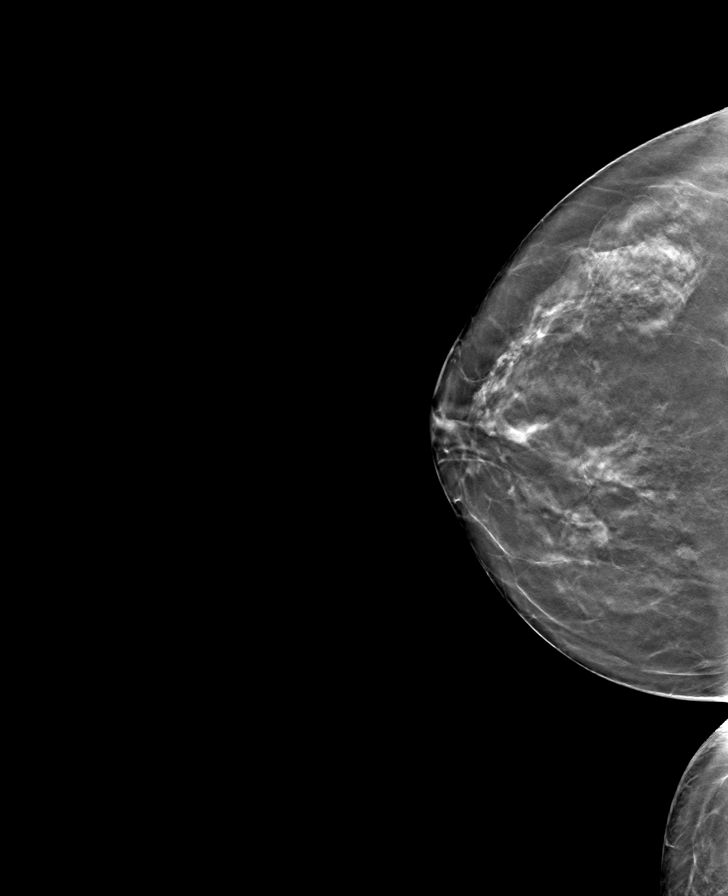

[L CC tomo · tomo slice 33/64.0]
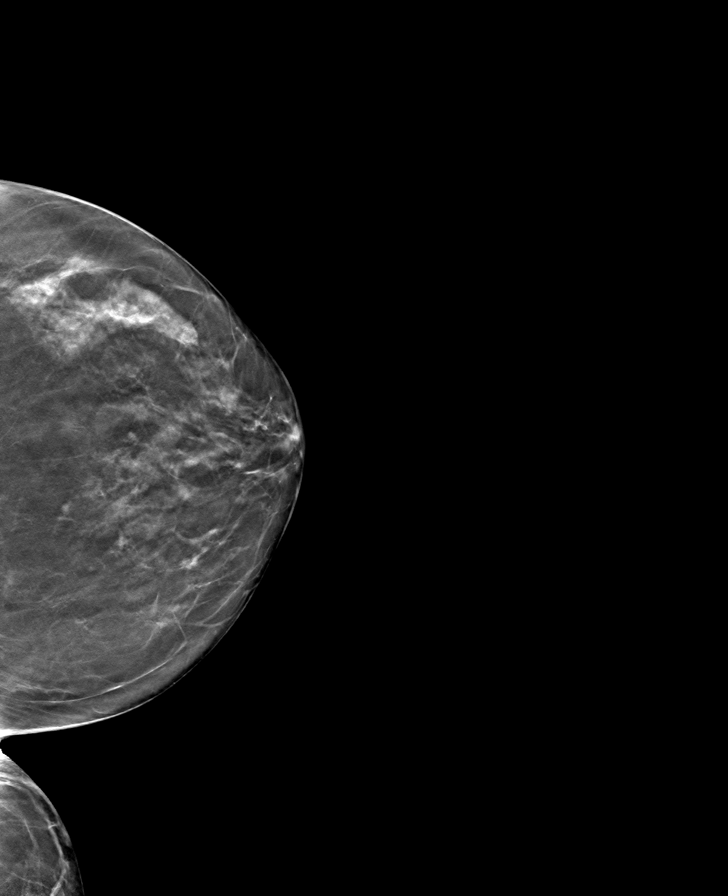

[R MLO tomo · tomo slice 30/59.0]
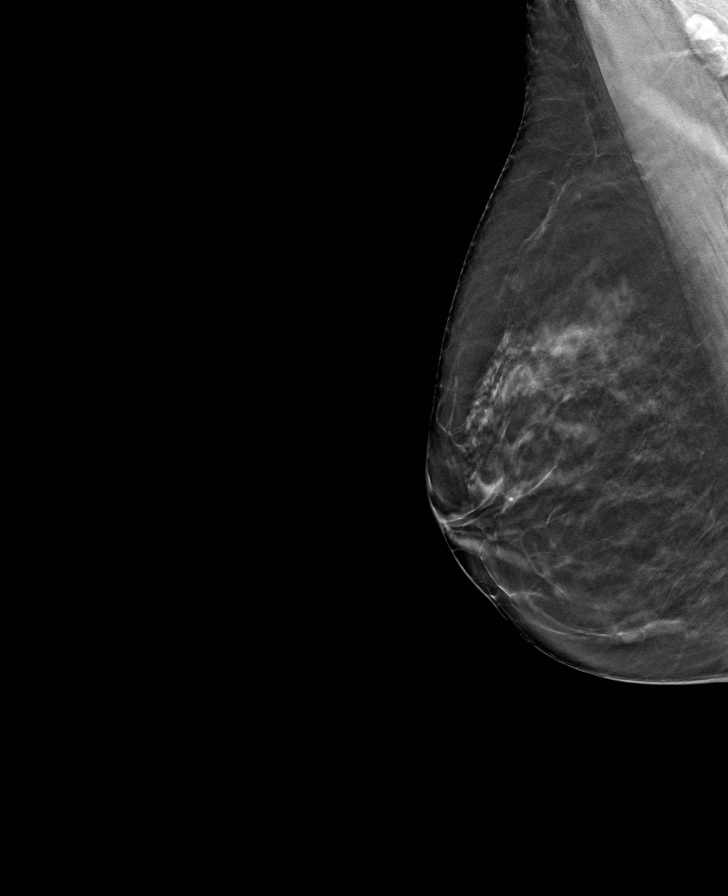

[8 of 24 positions shown; findings below may reference images not displayed]

ACR Breast Density Category b: There are scattered areas of
fibroglandular density.
FINDINGS: There are no findings suspicious for malignancy. Images were
processed with CAD.
IMPRESSION: No mammographic evidence of malignancy. A result letter of this
screening mammogram will be mailed directly to the patient.

RECOMMENDATION:
Screening mammogram in one year. (Code:[TQ])

BI-RADS CATEGORY  1: Negative.

## 2018-09-27 DIAGNOSIS — Z6821 Body mass index (BMI) 21.0-21.9, adult: Secondary | ICD-10-CM | POA: Diagnosis not present

## 2018-09-27 DIAGNOSIS — Z124 Encounter for screening for malignant neoplasm of cervix: Secondary | ICD-10-CM | POA: Diagnosis not present

## 2018-09-27 DIAGNOSIS — Z01419 Encounter for gynecological examination (general) (routine) without abnormal findings: Secondary | ICD-10-CM | POA: Diagnosis not present

## 2018-10-01 DIAGNOSIS — Z23 Encounter for immunization: Secondary | ICD-10-CM | POA: Diagnosis not present

## 2018-11-24 DIAGNOSIS — R482 Apraxia: Secondary | ICD-10-CM | POA: Diagnosis not present

## 2018-11-24 DIAGNOSIS — R4182 Altered mental status, unspecified: Secondary | ICD-10-CM | POA: Diagnosis not present

## 2018-11-24 DIAGNOSIS — R296 Repeated falls: Secondary | ICD-10-CM | POA: Diagnosis not present

## 2018-12-05 DIAGNOSIS — R4182 Altered mental status, unspecified: Secondary | ICD-10-CM | POA: Diagnosis not present

## 2018-12-05 DIAGNOSIS — H539 Unspecified visual disturbance: Secondary | ICD-10-CM | POA: Diagnosis not present

## 2018-12-05 DIAGNOSIS — R269 Unspecified abnormalities of gait and mobility: Secondary | ICD-10-CM | POA: Diagnosis not present

## 2018-12-07 DIAGNOSIS — R030 Elevated blood-pressure reading, without diagnosis of hypertension: Secondary | ICD-10-CM | POA: Diagnosis not present

## 2018-12-07 DIAGNOSIS — D496 Neoplasm of unspecified behavior of brain: Secondary | ICD-10-CM | POA: Diagnosis not present

## 2018-12-08 ENCOUNTER — Other Ambulatory Visit: Payer: Self-pay | Admitting: Neurosurgery

## 2018-12-08 ENCOUNTER — Other Ambulatory Visit (HOSPITAL_COMMUNITY): Payer: Self-pay | Admitting: Neurosurgery

## 2018-12-08 DIAGNOSIS — D496 Neoplasm of unspecified behavior of brain: Secondary | ICD-10-CM

## 2018-12-09 ENCOUNTER — Ambulatory Visit (HOSPITAL_COMMUNITY)
Admission: RE | Admit: 2018-12-09 | Discharge: 2018-12-09 | Disposition: A | Discharge status: Home / Self Care | Payer: BC Managed Care – PPO | Source: Ambulatory Visit | Attending: Neurosurgery | Admitting: Neurosurgery

## 2018-12-09 ENCOUNTER — Other Ambulatory Visit: Payer: Self-pay

## 2018-12-09 DIAGNOSIS — D496 Neoplasm of unspecified behavior of brain: Secondary | ICD-10-CM | POA: Diagnosis not present

## 2018-12-09 DIAGNOSIS — K8020 Calculus of gallbladder without cholecystitis without obstruction: Secondary | ICD-10-CM | POA: Diagnosis not present

## 2018-12-09 DIAGNOSIS — R296 Repeated falls: Secondary | ICD-10-CM | POA: Diagnosis not present

## 2018-12-09 DIAGNOSIS — C719 Malignant neoplasm of brain, unspecified: Secondary | ICD-10-CM | POA: Diagnosis not present

## 2018-12-09 LAB — POCT I-STAT CREATININE: Creatinine, Ser: 1 mg/dL (ref 0.44–1.00)

## 2018-12-09 IMAGING — MR MR HEAD WO/W CM
9 of 13 series · 31 of 48 positions shown · IV contrast (gadavist)
Comparison: [DATE]

CLINICAL DATA: Brain tumor

EXAM:
MRI HEAD WITHOUT AND WITH CONTRAST
TECHNIQUE: Multiplanar, multiecho pulse sequences of the brain and surrounding
structures were obtained without and with intravenous contrast.
CONTRAST:  6mL GADAVIST GADOBUTROL 1 MMOL/ML IV SOLN

[Series 3: DWI · axial · 3.0mm · 1.09mm/px · z∈[-85,+68]mm · 6 of 108 slices shown (1 of 4)]
[im 1/108]
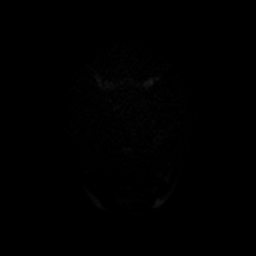
[im 22/108]
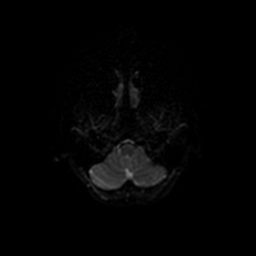
[im 43/108]
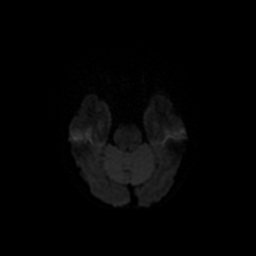
[im 65/108]
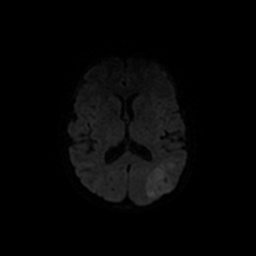
[im 86/108]
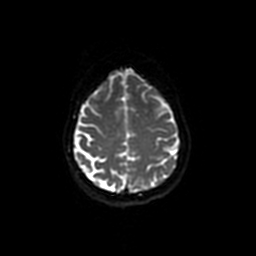
[im 108/108]
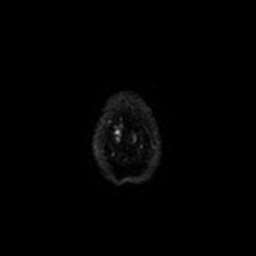

[Series 5: T2 · axial · 5.0mm · 0.43mm/px · 1 of 24 slices shown]
[im 1/24]
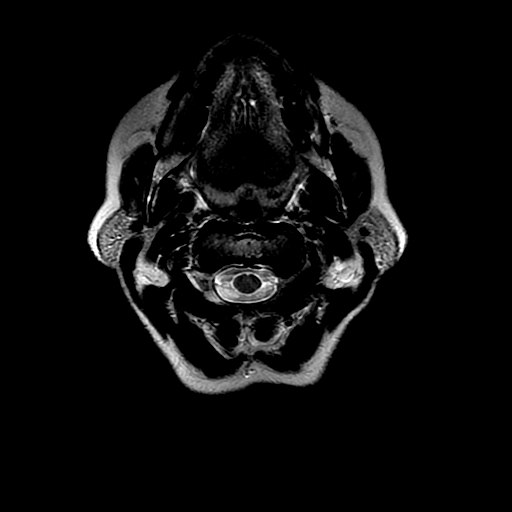

[Series 6: FLAIR · axial · 3.0mm · 0.43mm/px · z∈[-76,+72]mm · 2 of 27 slices shown]
[im 1/27]
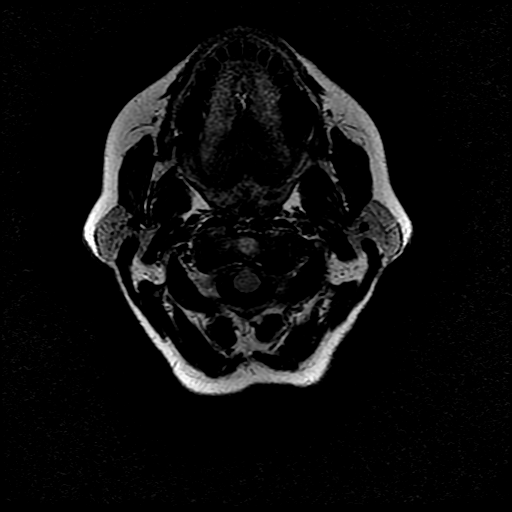
[im 27/27]
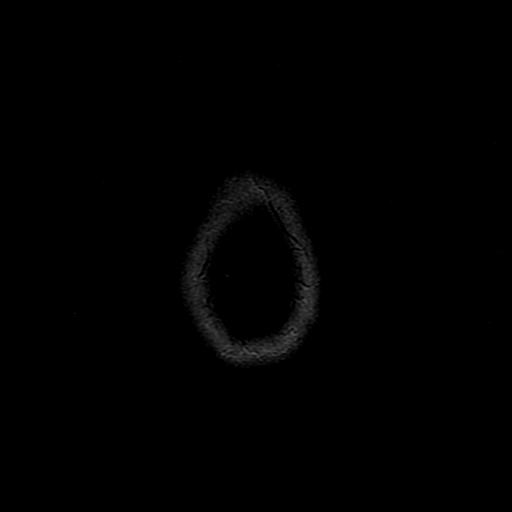

[Series 9: DWI · coronal · 4.0mm · 1.09mm/px · 5 of 88 slices shown (2 of 4)]
[im 1/88]
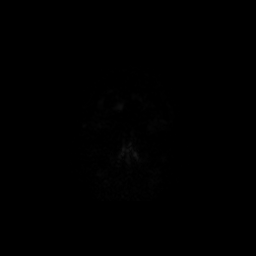
[im 22/88]
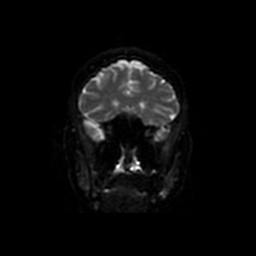
[im 44/88]
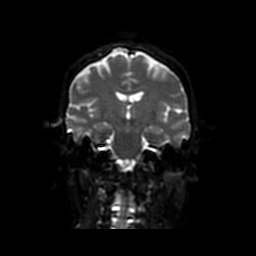
[im 66/88]
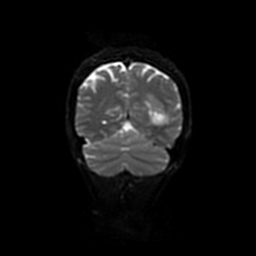
[im 88/88]
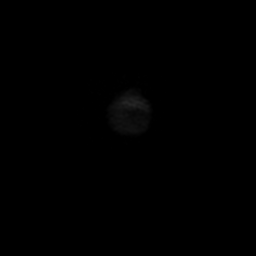

[Series 11: T1 post-contrast · axial · 1.0mm · 0.47mm/px · z∈[-82,+74]mm · 8 of 164 slices shown (1 of 3)]
[im 1/164]
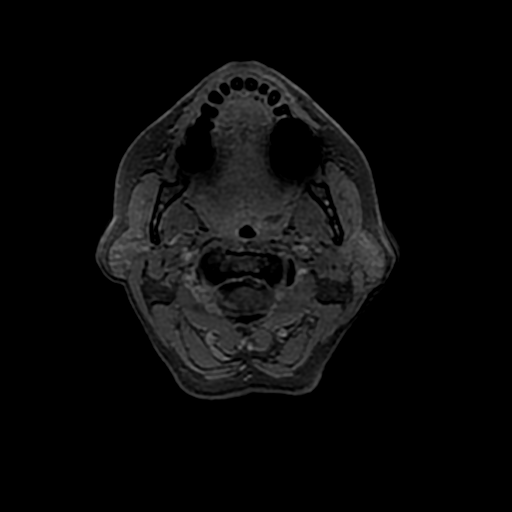
[im 19/164]
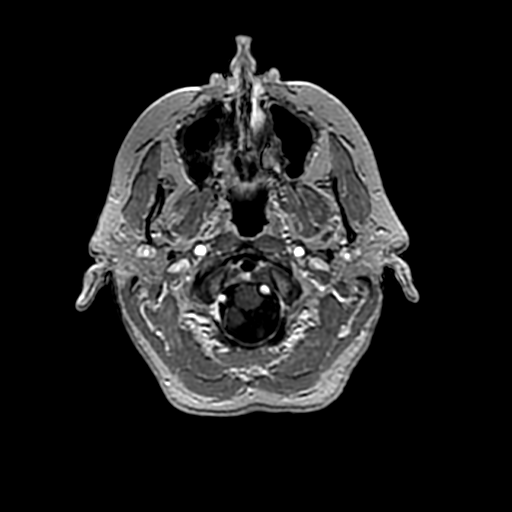
[im 55/164]
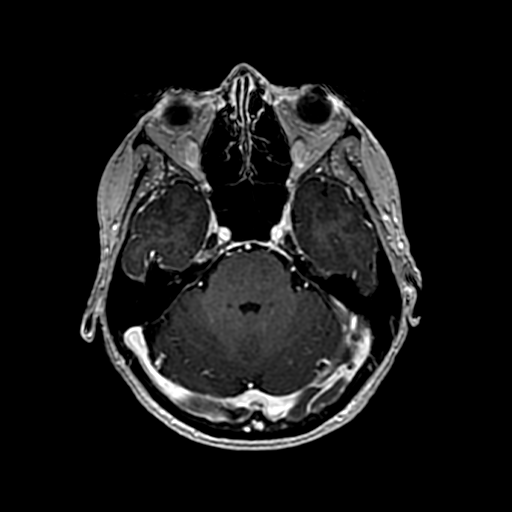
[im 73/164]
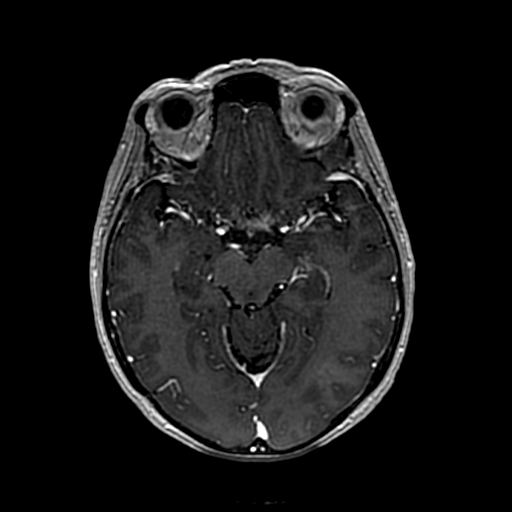
[im 91/164]
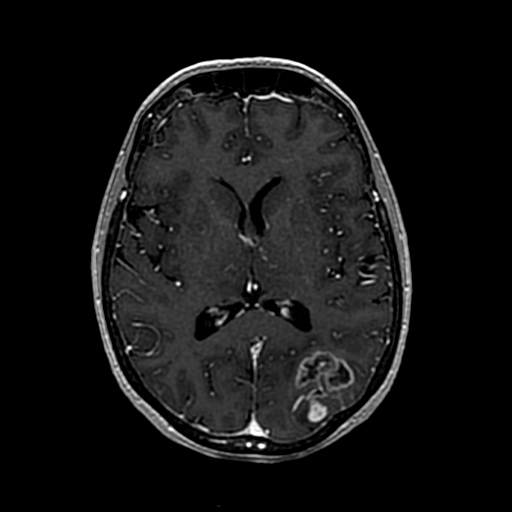
[im 109/164]
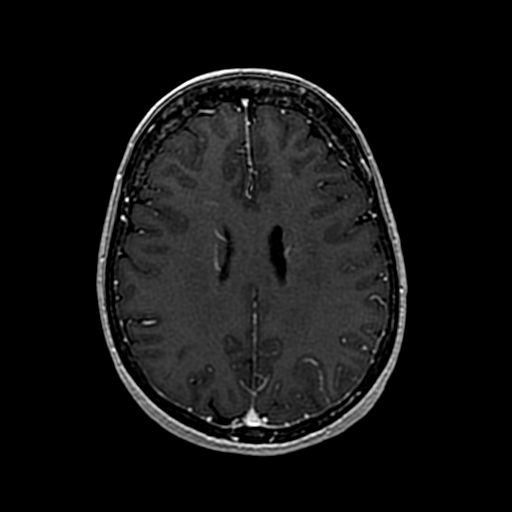
[im 145/164]
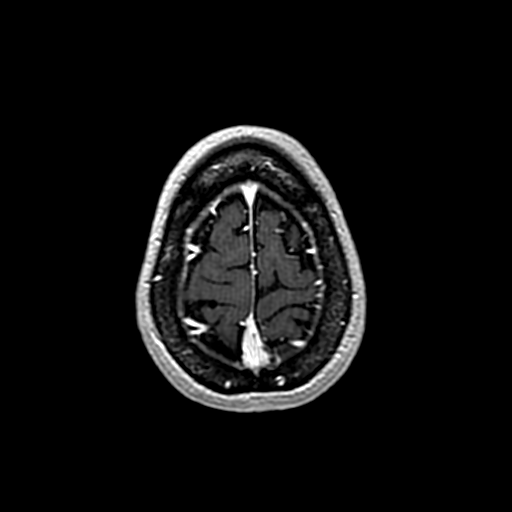
[im 164/164]
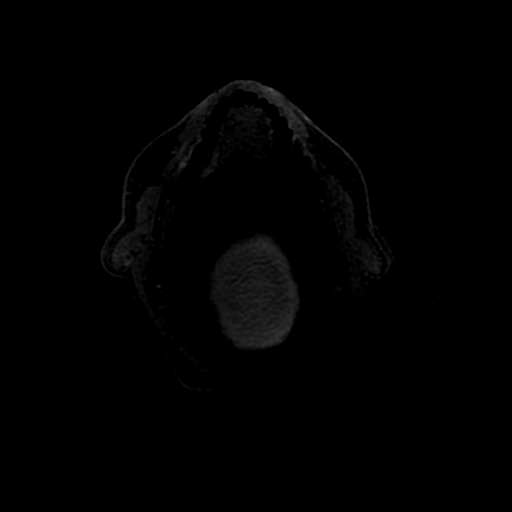

[Series 12: T1 post-contrast · coronal · 5.0mm · 0.43mm/px · 2 of 28 slices shown (2 of 3)]
[im 1/28]
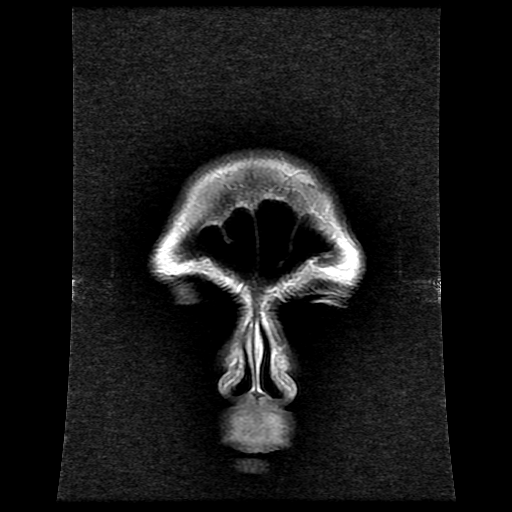
[im 28/28]
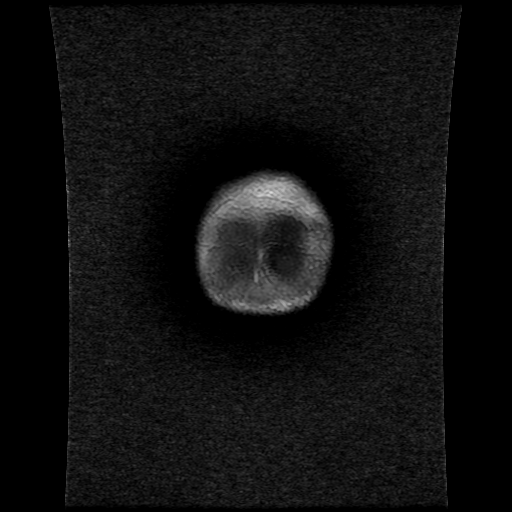

[Series 13: T1 post-contrast · sagittal · 5.0mm · 0.47mm/px · 1 of 22 slices shown (3 of 3)]
[im 1/22]
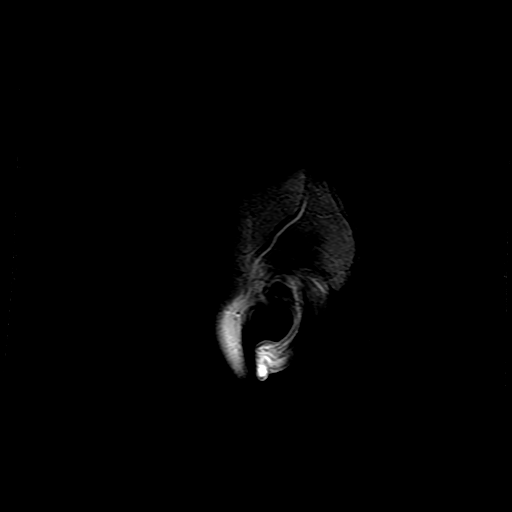

[Series 300: DWI · axial · 3.0mm · 1.09mm/px · z∈[-85,+68]mm · 3 of 53 slices shown (3 of 4)]
[im 1/53]
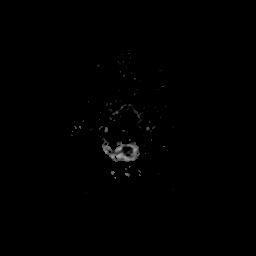
[im 27/53]
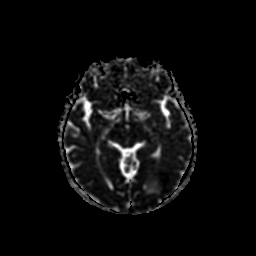
[im 53/53]
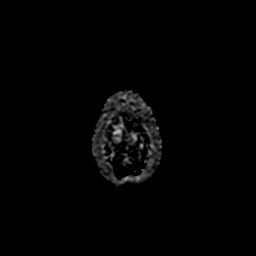

[Series 900: DWI · coronal · 4.0mm · 1.09mm/px · 3 of 44 slices shown (4 of 4)]
[im 1/44]
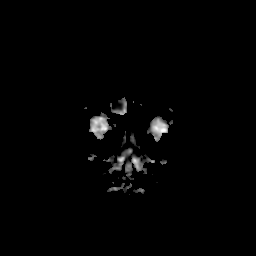
[im 22/44]
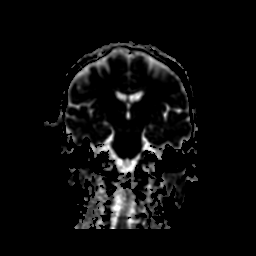
[im 44/44]
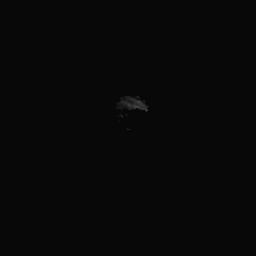

[31 of 48 positions shown; findings below may reference images not displayed]

FINDINGS: Brain:

As seen on the prior study, there is a heterogeneous parenchymal
lesion in the left parieto-occipital region. There is irregular
enhancement at the periphery with central necrosis. Representative
measurements of 3.3 x 3 x 3.0 cm. There is some corresponding
reduced diffusion. There is adjacent T2 FLAIR hyperintensity
including thickening of the adjacent gyri. Stable minor regional
mass effect.

Otherwise no abnormal enhancement or new abnormal signal.

Vascular: Major vessel flow voids at the skull base are preserved.

Skull and upper cervical spine: Normal marrow signal is preserved.

Sinuses/Orbits: Paranasal sinuses are aerated. Orbits are
unremarkable.

Other: Sella is unremarkable.  Mastoid air cells are clear.
IMPRESSION: Left parieto-occipital lesion is most consistent with a high-grade
glial neoplasm. Metastasis is considered much less likely.

## 2018-12-09 IMAGING — CT CT ABD-PELV W/ CM
2 of 5 series · 13 of 36 positions shown, 16 images · IV contrast (omnipaque)
Comparison: None.

CLINICAL DATA: 63-year-old female with brain tumor and recurrent
falls.

EXAM:
CT CHEST, ABDOMEN, AND PELVIS WITH CONTRAST
TECHNIQUE: Multidetector CT imaging of the chest, abdomen and pelvis was
performed following the standard protocol during bolus
administration of intravenous contrast.
CONTRAST:  100mL OMNIPAQUE IOHEXOL 300 MG/ML  SOLN

[Series 2: cap with · axial · 0.65mm/px · z∈[-562,-77]mm · 10 of 119 slices shown, 13 images]
[im 11/119  mediastinal]
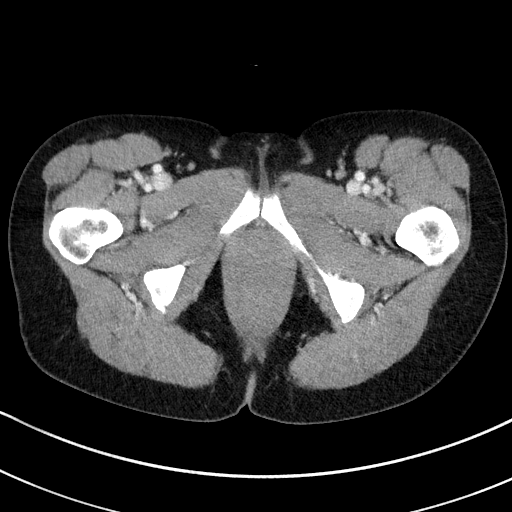
[im 11/119  lung]
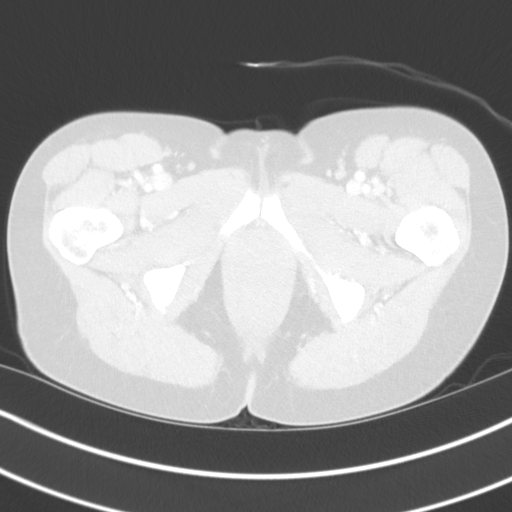
[im 22/119  lung]
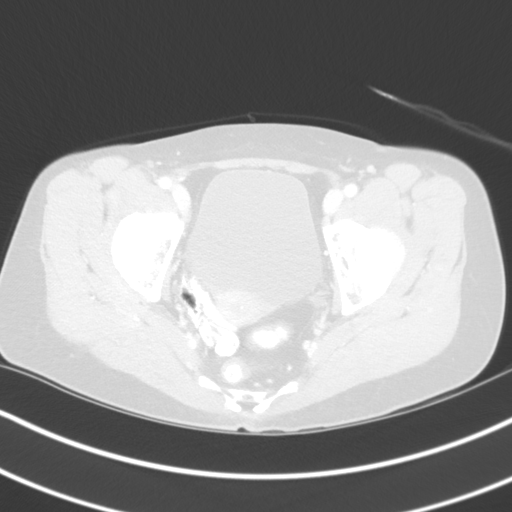
[im 33/119  lung]
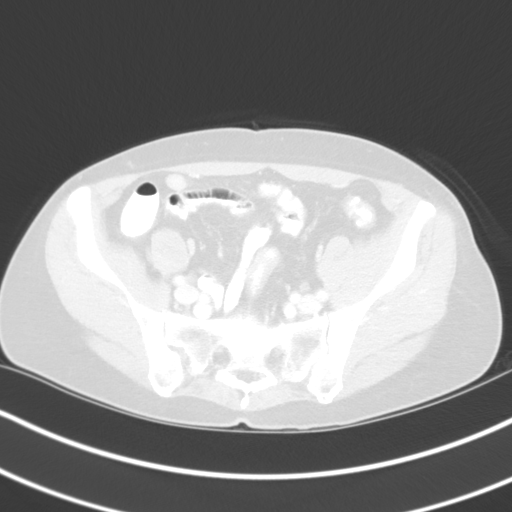
[im 43/119  lung]
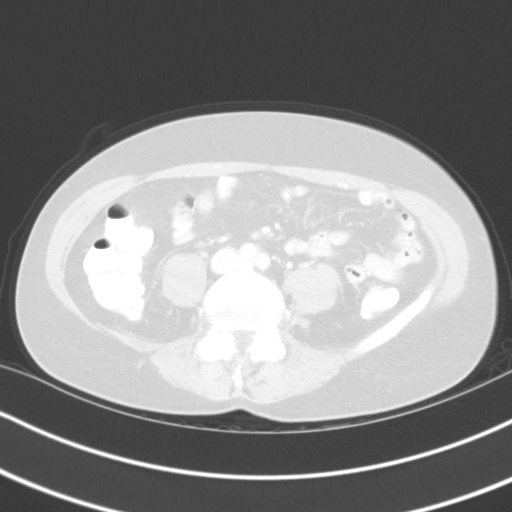
[im 54/119  mediastinal]
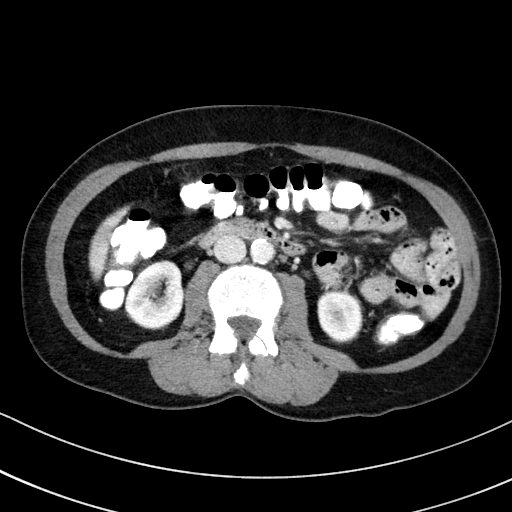
[im 54/119  lung]
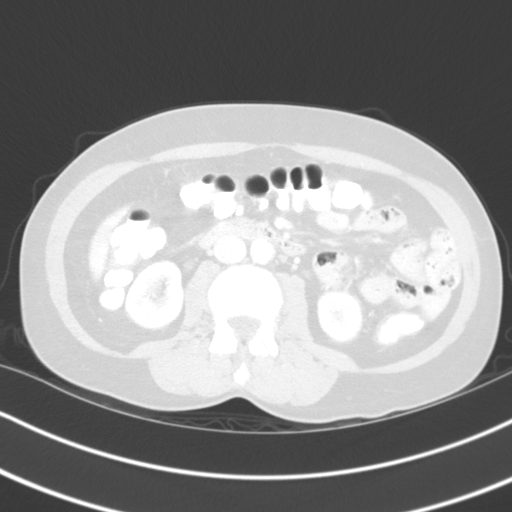
[im 65/119  lung]
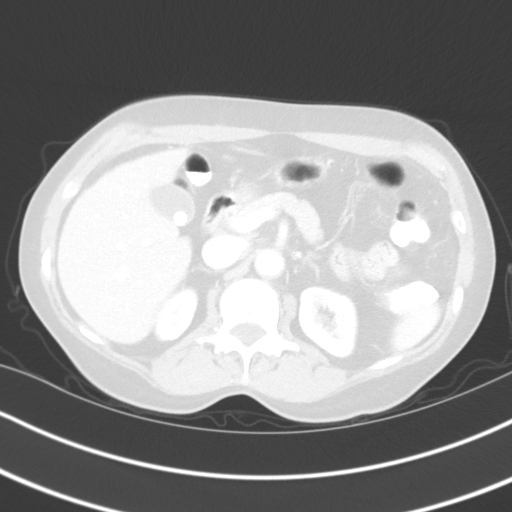
[im 76/119  lung]
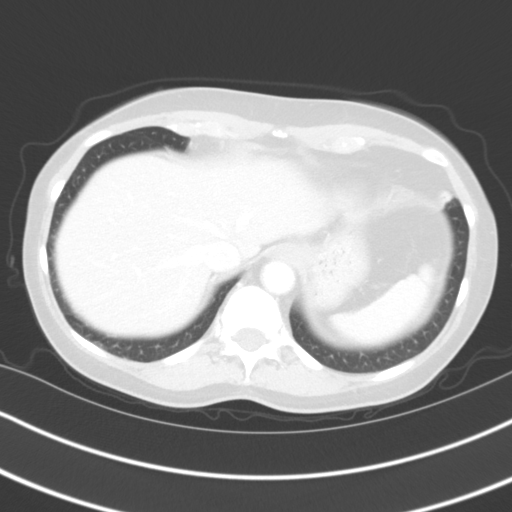
[im 86/119  lung]
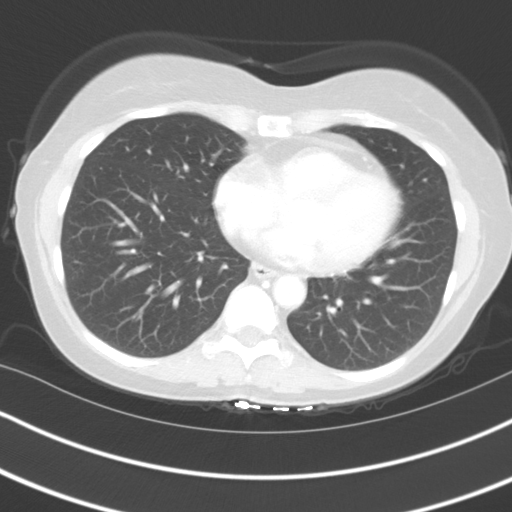
[im 97/119  mediastinal]
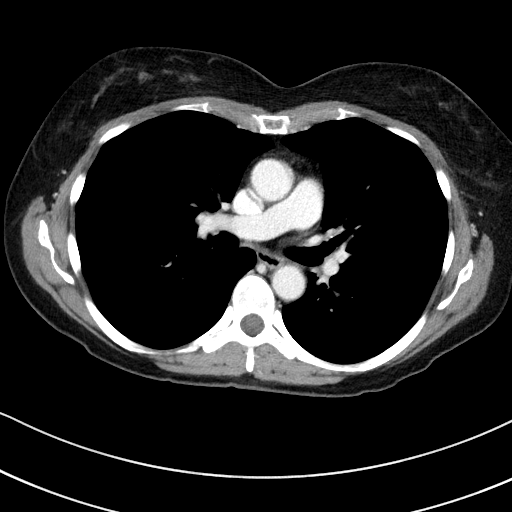
[im 97/119  lung]
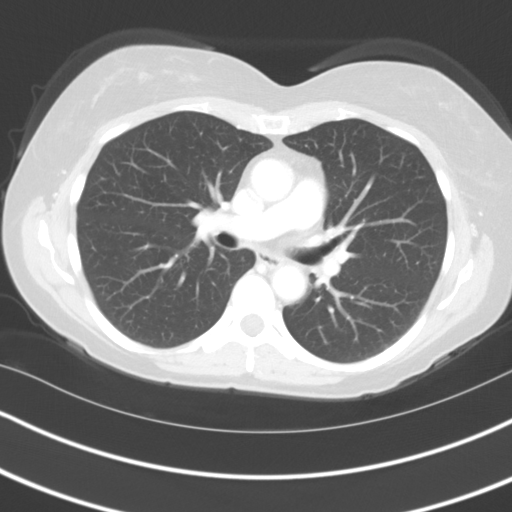
[im 108/119  lung]
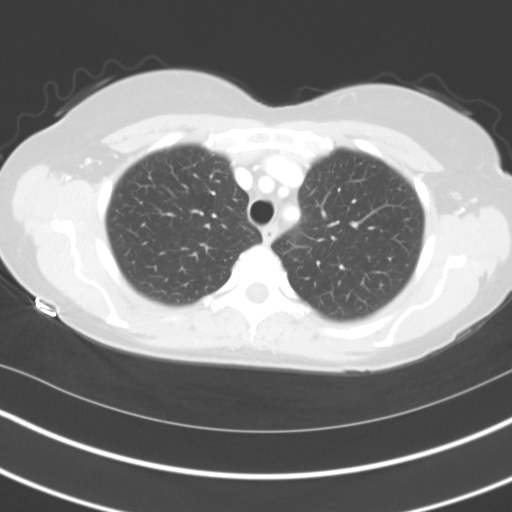

[Series 5: coronals · coronal · 0.65mm/px · 3 of 109 slices shown]
[im 22/109  lung]
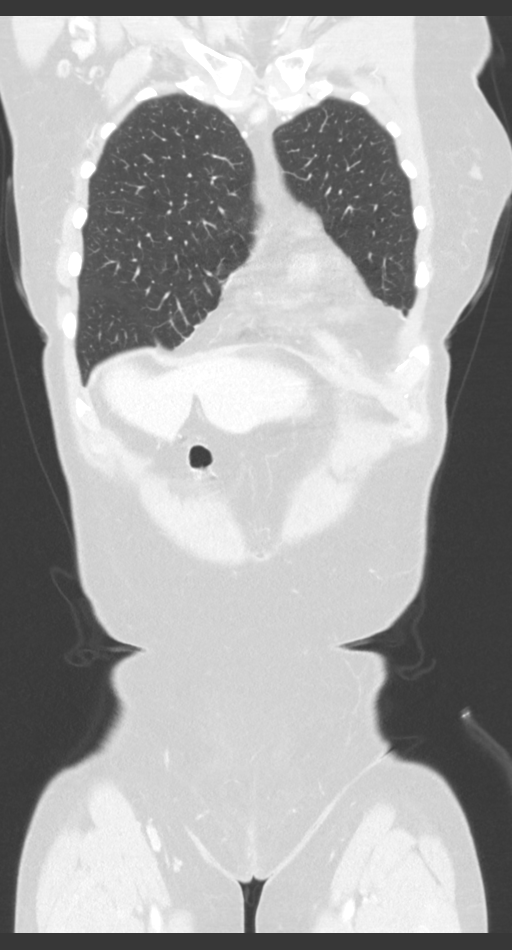
[im 44/109  lung]
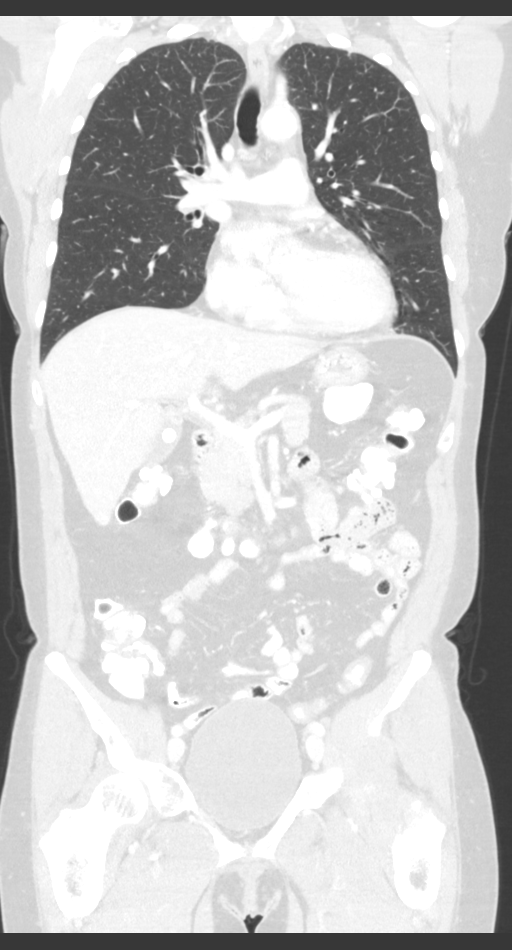
[im 65/109  lung]
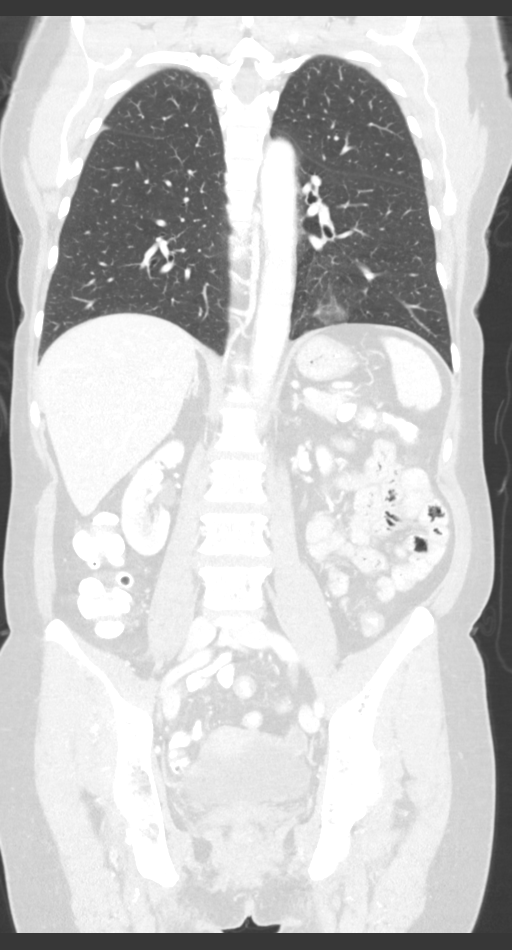

[13 of 36 positions shown; findings below may reference images not displayed]

FINDINGS: CT CHEST FINDINGS

Cardiovascular: There is no cardiomegaly or pericardial effusion.
The thoracic aorta is unremarkable. The origins of the great vessels
of the aortic arch appear patent as visualized. The central
pulmonary arteries appear patent.

Mediastinum/Nodes: No hilar or mediastinal adenopathy. The esophagus
is grossly unremarkable. No mediastinal fluid collection.

Lungs/Pleura: Left lung base linear atelectasis/scarring. The lungs
are otherwise clear. There is no pleural effusion or pneumothorax.
The central airways are patent.

Musculoskeletal: No chest wall mass or suspicious bone lesions
identified.

CT ABDOMEN PELVIS FINDINGS

No intra-abdominal free air or free fluid.

Hepatobiliary: Subcentimeter right hepatic hypodense focus as well
as additional subcentimeter hypodense lesion in the dome of the
liver are too small to characterize. There is no intrahepatic
biliary ductal dilatation. There is a 1 cm stone in the gallbladder.
No pericholecystic fluid or evidence of acute cholecystitis by CT.

Pancreas: Unremarkable. No pancreatic ductal dilatation or
surrounding inflammatory changes.

Spleen: Normal in size without focal abnormality.

Adrenals/Urinary Tract: The adrenal glands are unremarkable. The
kidneys, visualized ureters, and urinary bladder appear
unremarkable.

Stomach/Bowel: Mild diffuse thickened appearance of the distal colon
and rectosigmoid may be partly related to underdistention. No
significant pericolonic stranding identified. However, mild colitis
is not excluded. Clinical correlation is recommended. There are
several small scattered sigmoid and distal colonic diverticula
without active inflammatory changes. There is no bowel obstruction
or active inflammation. The appendix is normal.

Vascular/Lymphatic: The abdominal aorta and IVC are unremarkable.
The SMV, splenic vein, and main portal vein are patent. No portal
venous gas. There is no adenopathy.

Reproductive: The uterus and ovaries are grossly unremarkable. No
adnexal masses.

Other: None

Musculoskeletal: Grade 1 L4-L5 anterolisthesis. No acute osseous
pathology.
IMPRESSION: 1. No acute intrathoracic pathology.
2. Underdistention versus mild colitis. Clinical correlation is
recommended. No bowel obstruction. Normal appendix.
3. Small scattered colonic diverticula.
4. Cholelithiasis.

## 2018-12-09 IMAGING — CT CT ABD-PELV W/ CM
2 of 5 series · 13 of 36 positions shown, 16 images · IV contrast (APPLIED)
Comparison: None.

CLINICAL DATA: 63-year-old female with brain tumor and recurrent
falls.

EXAM:
CT CHEST, ABDOMEN, AND PELVIS WITH CONTRAST
TECHNIQUE: Multidetector CT imaging of the chest, abdomen and pelvis was
performed following the standard protocol during bolus
administration of intravenous contrast.
CONTRAST:  100mL OMNIPAQUE IOHEXOL 300 MG/ML  SOLN

[Series 2: cap with · axial · 0.81mm/px · z∈[-340,+185]mm · 10 of 127 slices shown, 13 images]
[im 11/127  mediastinal]
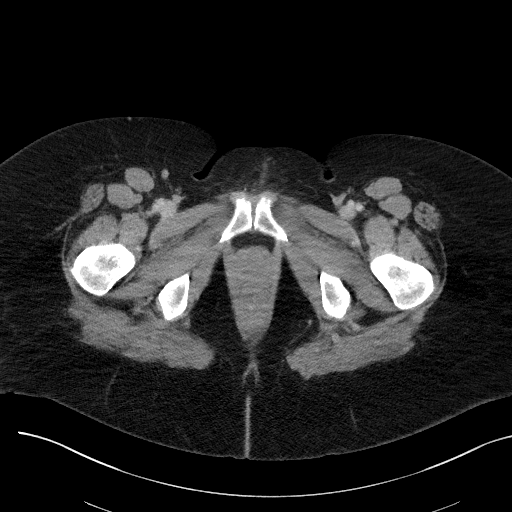
[im 11/127  lung]
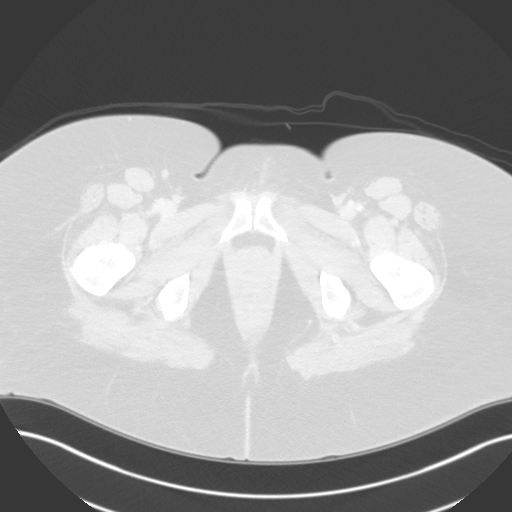
[im 22/127  lung]
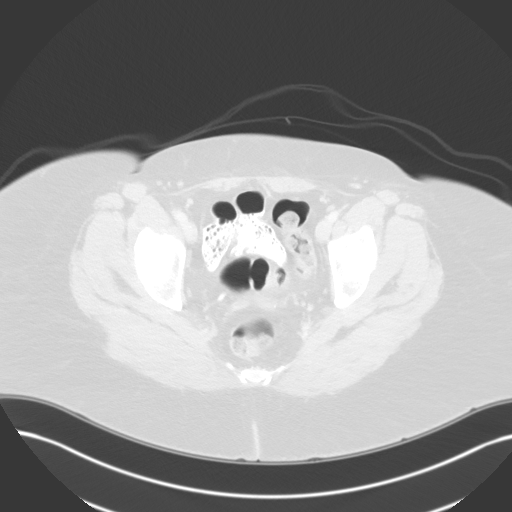
[im 32/127  lung]
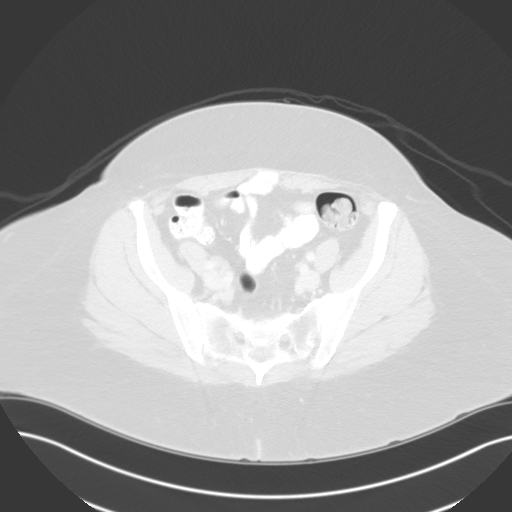
[im 43/127  lung]
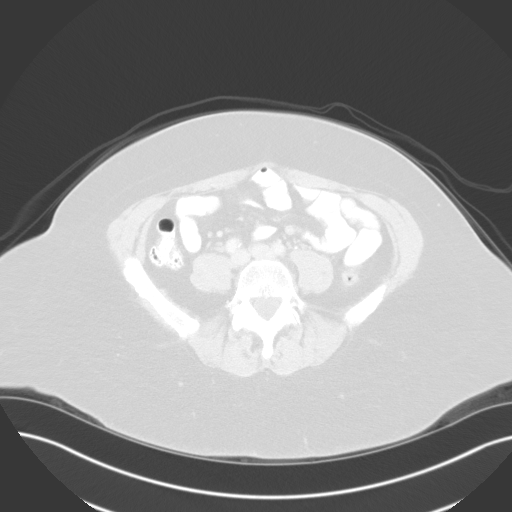
[im 53/127  mediastinal]
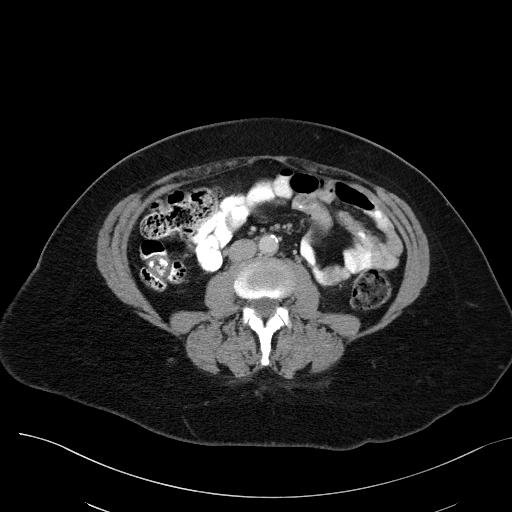
[im 53/127  lung]
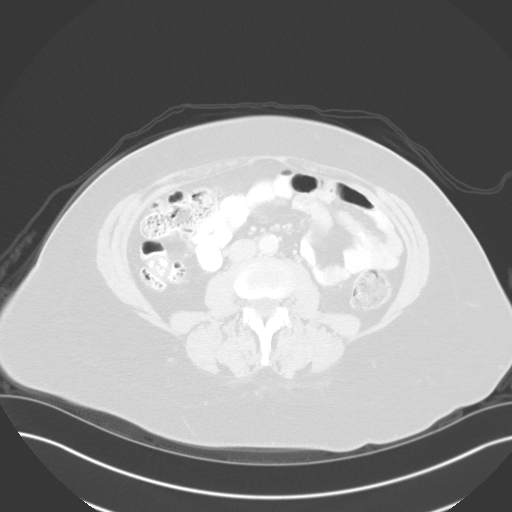
[im 74/127  lung]
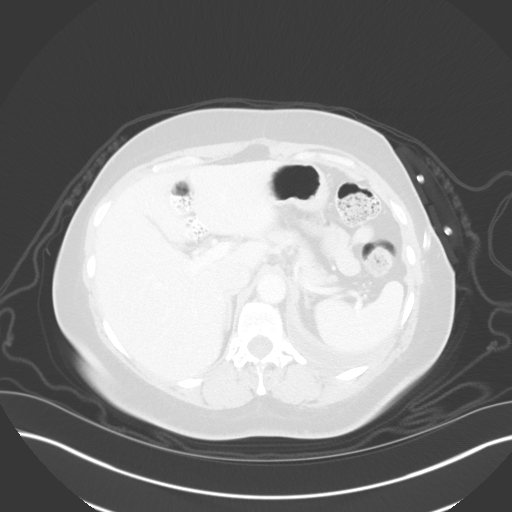
[im 85/127  lung]
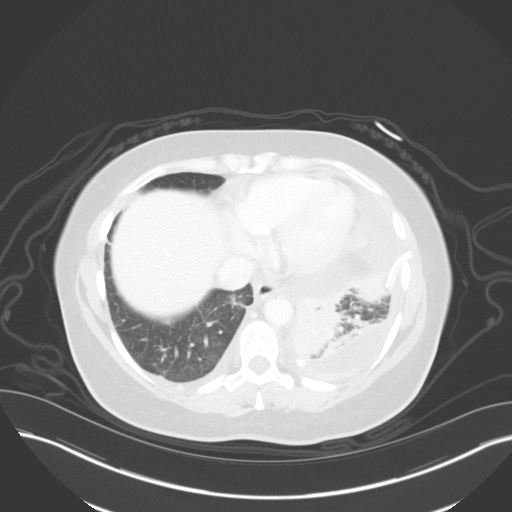
[im 95/127  lung]
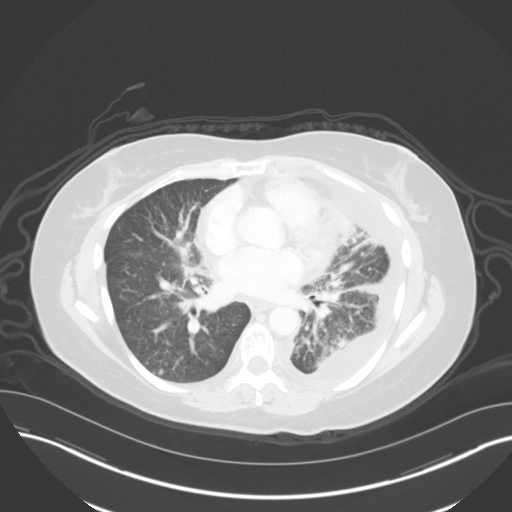
[im 106/127  mediastinal]
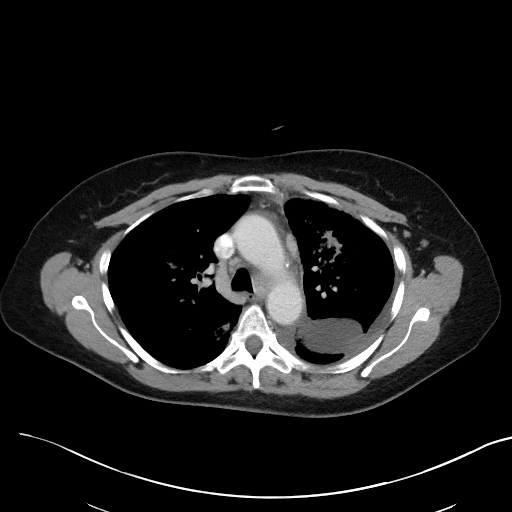
[im 106/127  lung]
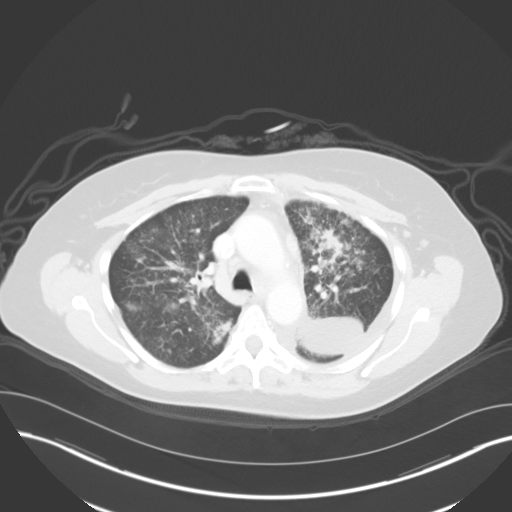
[im 116/127  lung]
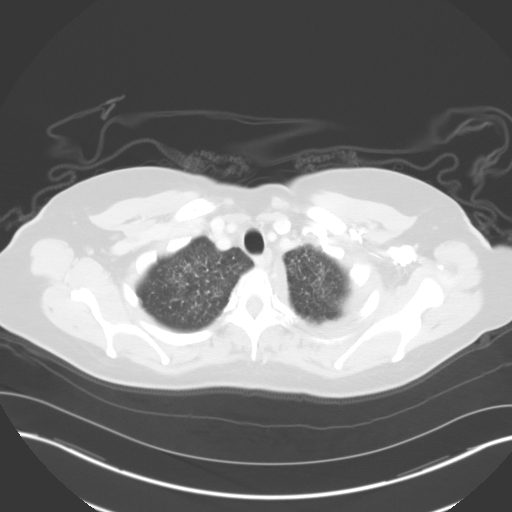

[Series 5: coronals · coronal · 0.70mm/px · 3 of 128 slices shown]
[im 26/128  lung]
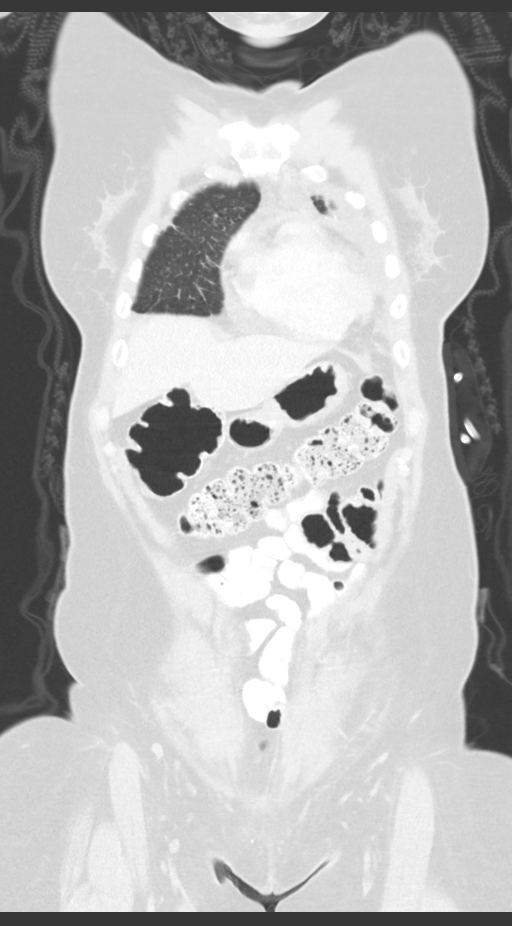
[im 51/128  lung]
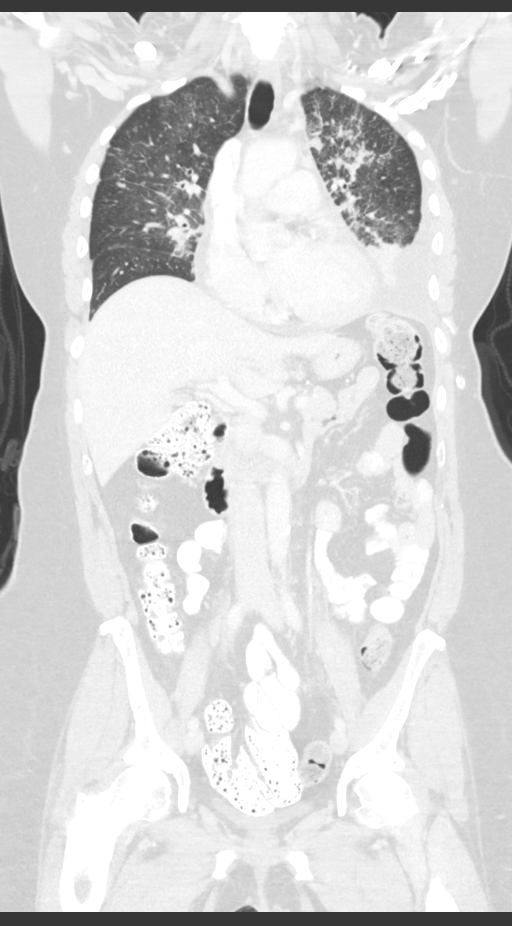
[im 77/128  lung]
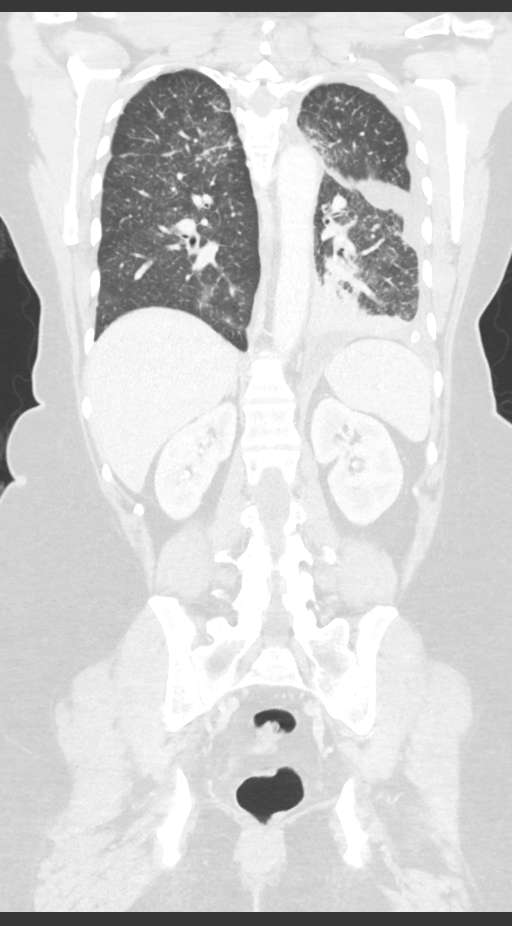

[13 of 36 positions shown; findings below may reference images not displayed]

FINDINGS: CT CHEST FINDINGS

Cardiovascular: There is no cardiomegaly or pericardial effusion.
The thoracic aorta is unremarkable. The origins of the great vessels
of the aortic arch appear patent as visualized. The central
pulmonary arteries appear patent.

Mediastinum/Nodes: No hilar or mediastinal adenopathy. The esophagus
is grossly unremarkable. No mediastinal fluid collection.

Lungs/Pleura: Left lung base linear atelectasis/scarring. The lungs
are otherwise clear. There is no pleural effusion or pneumothorax.
The central airways are patent.

Musculoskeletal: No chest wall mass or suspicious bone lesions
identified.

CT ABDOMEN PELVIS FINDINGS

No intra-abdominal free air or free fluid.

Hepatobiliary: Subcentimeter right hepatic hypodense focus as well
as additional subcentimeter hypodense lesion in the dome of the
liver are too small to characterize. There is no intrahepatic
biliary ductal dilatation. There is a 1 cm stone in the gallbladder.
No pericholecystic fluid or evidence of acute cholecystitis by CT.

Pancreas: Unremarkable. No pancreatic ductal dilatation or
surrounding inflammatory changes.

Spleen: Normal in size without focal abnormality.

Adrenals/Urinary Tract: The adrenal glands are unremarkable. The
kidneys, visualized ureters, and urinary bladder appear
unremarkable.

Stomach/Bowel: Mild diffuse thickened appearance of the distal colon
and rectosigmoid may be partly related to underdistention. No
significant pericolonic stranding identified. However, mild colitis
is not excluded. Clinical correlation is recommended. There are
several small scattered sigmoid and distal colonic diverticula
without active inflammatory changes. There is no bowel obstruction
or active inflammation. The appendix is normal.

Vascular/Lymphatic: The abdominal aorta and IVC are unremarkable.
The SMV, splenic vein, and main portal vein are patent. No portal
venous gas. There is no adenopathy.

Reproductive: The uterus and ovaries are grossly unremarkable. No
adnexal masses.

Other: None

Musculoskeletal: Grade 1 L4-L5 anterolisthesis. No acute osseous
pathology.
IMPRESSION: 1. No acute intrathoracic pathology.
2. Underdistention versus mild colitis. Clinical correlation is
recommended. No bowel obstruction. Normal appendix.
3. Small scattered colonic diverticula.
4. Cholelithiasis.

## 2018-12-09 IMAGING — CT CT CHEST W/ CM
2 of 5 series · 13 of 36 positions shown, 16 images · IV contrast (APPLIED)
Comparison: None.

CLINICAL DATA: 63-year-old female with brain tumor and recurrent
falls.

EXAM:
CT CHEST, ABDOMEN, AND PELVIS WITH CONTRAST
TECHNIQUE: Multidetector CT imaging of the chest, abdomen and pelvis was
performed following the standard protocol during bolus
administration of intravenous contrast.
CONTRAST:  100mL OMNIPAQUE IOHEXOL 300 MG/ML  SOLN

[Series 2: cap with · axial · 0.65mm/px · z∈[-562,-77]mm · 10 of 119 slices shown, 13 images]
[im 11/119  mediastinal]
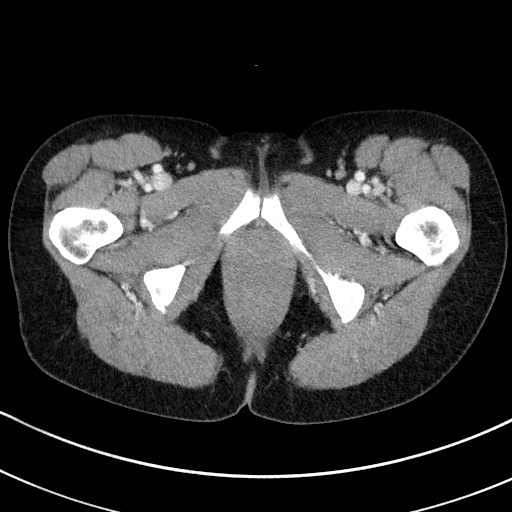
[im 11/119  lung]
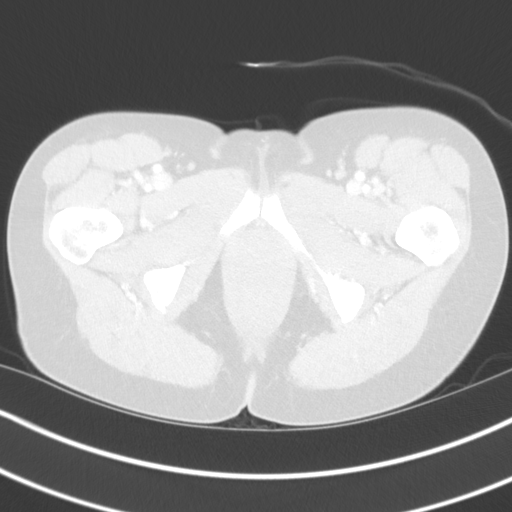
[im 22/119  lung]
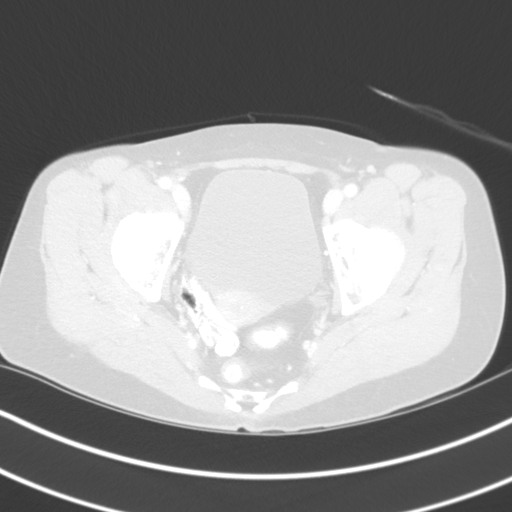
[im 33/119  lung]
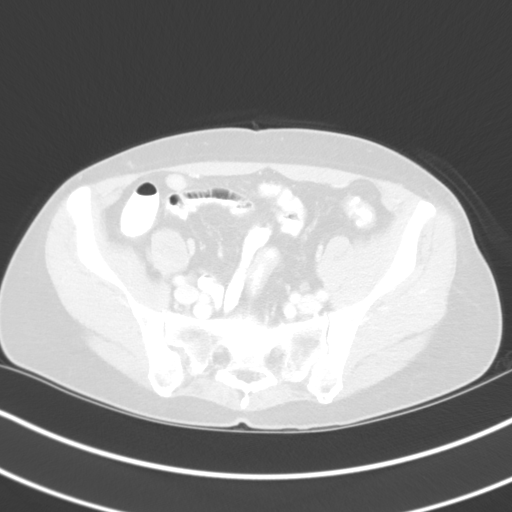
[im 43/119  lung]
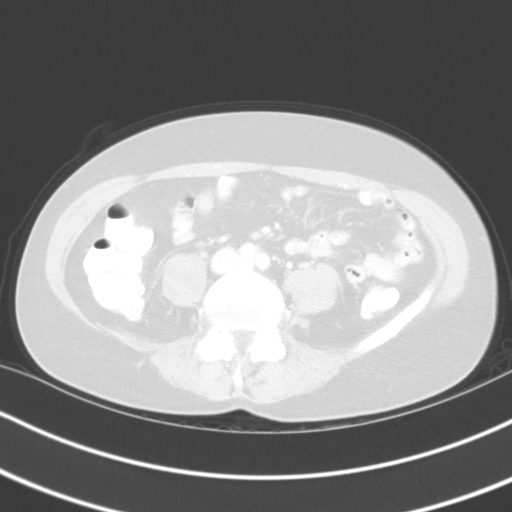
[im 54/119  mediastinal]
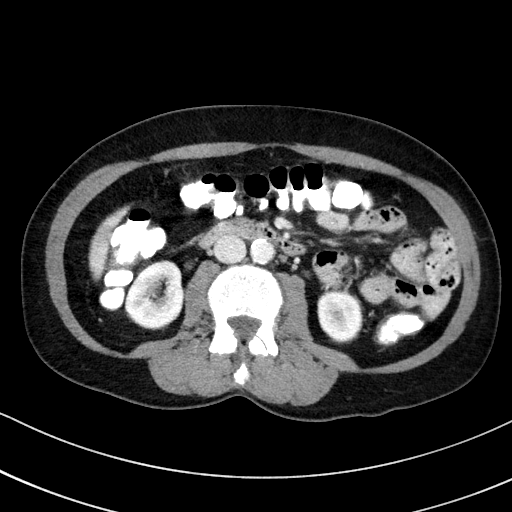
[im 54/119  lung]
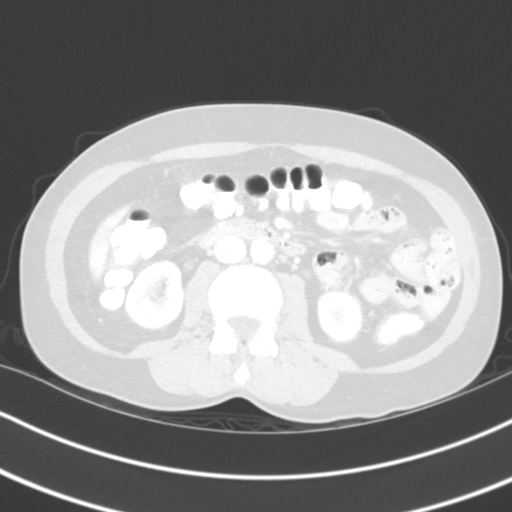
[im 65/119  lung]
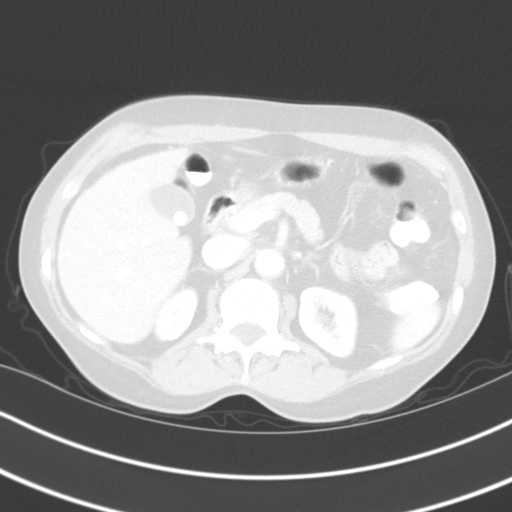
[im 76/119  lung]
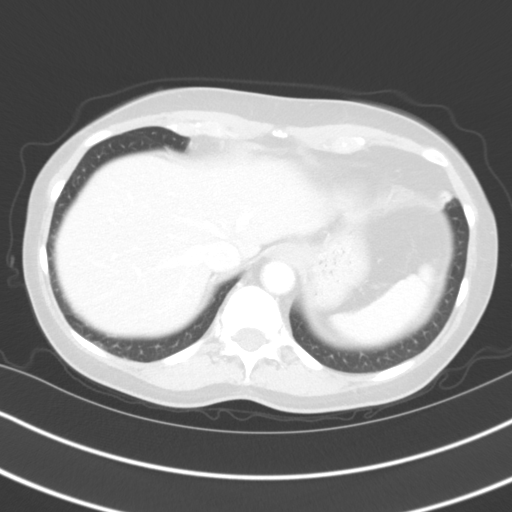
[im 86/119  lung]
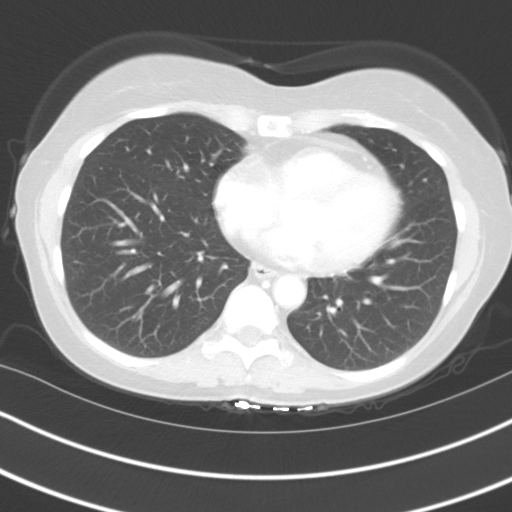
[im 97/119  mediastinal]
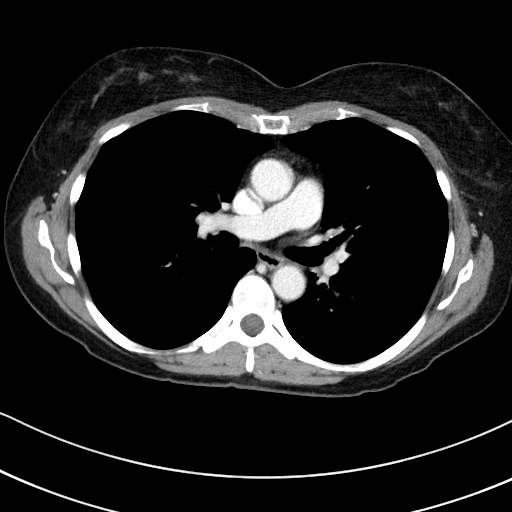
[im 97/119  lung]
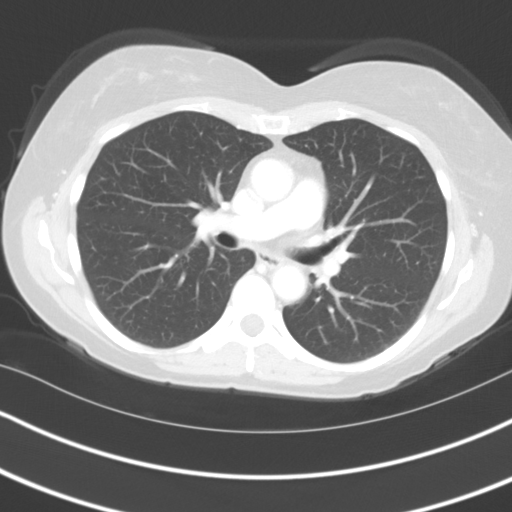
[im 108/119  lung]
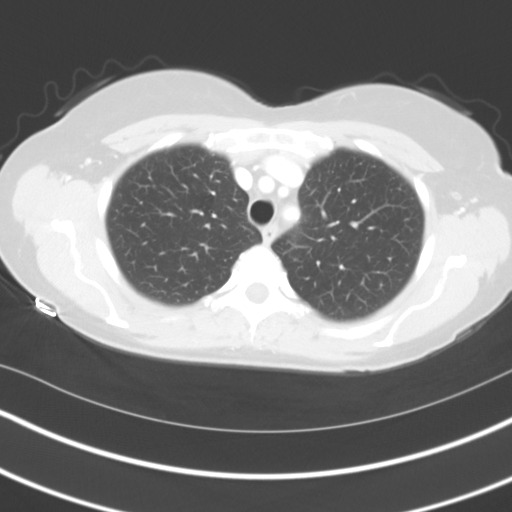

[Series 5: coronals · coronal · 0.65mm/px · 3 of 109 slices shown]
[im 22/109  lung]
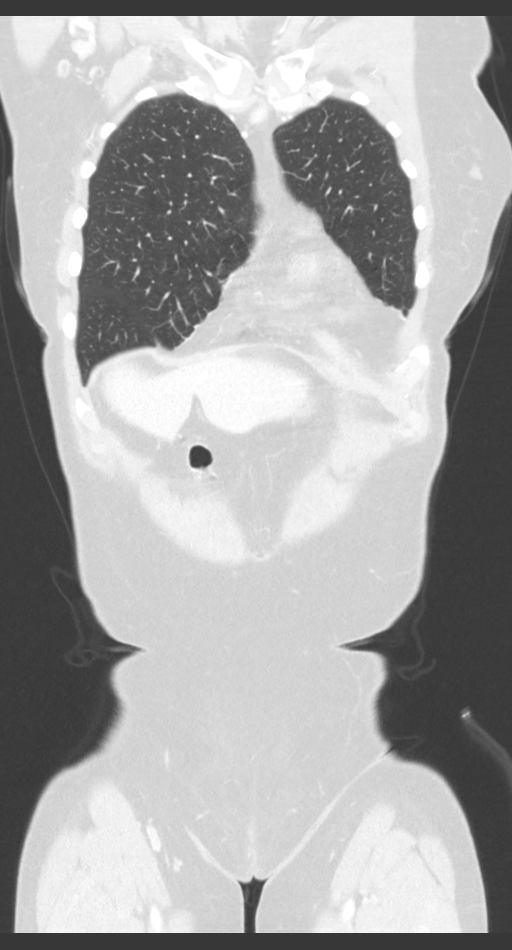
[im 44/109  lung]
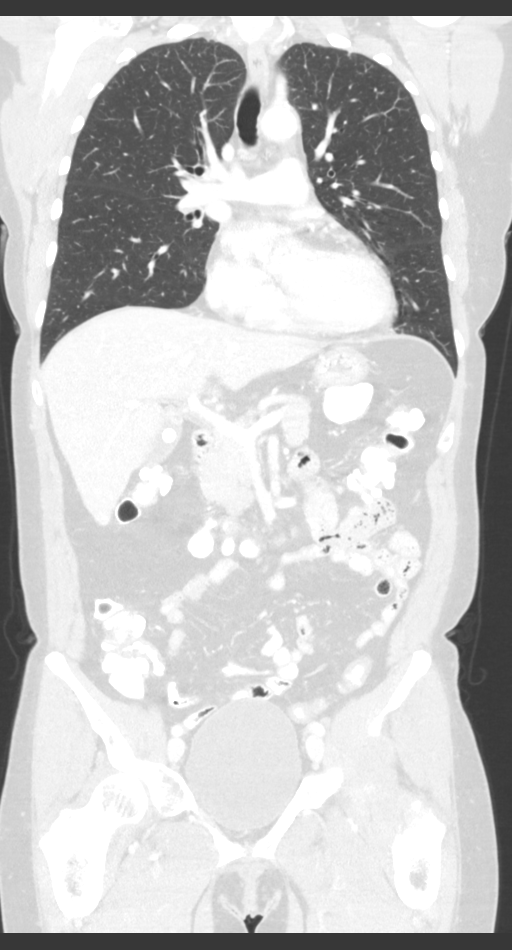
[im 65/109  lung]
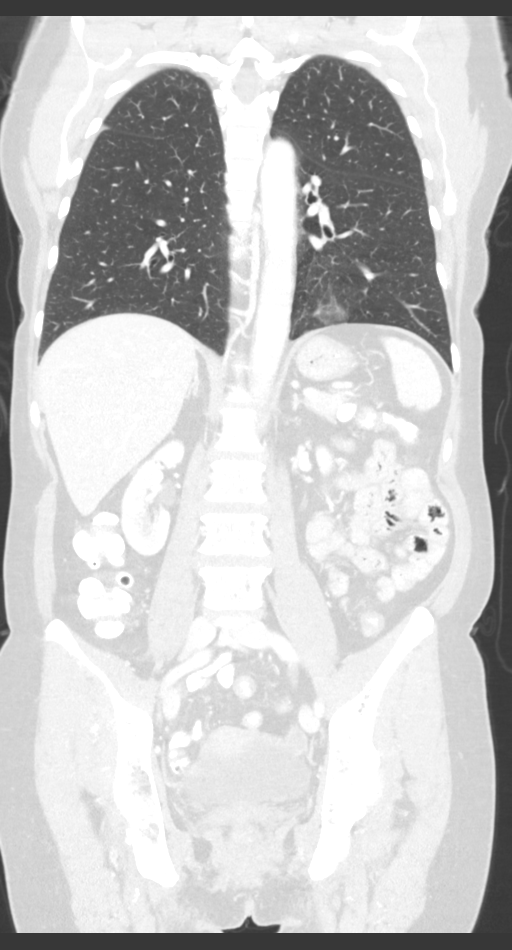

[13 of 36 positions shown; findings below may reference images not displayed]

FINDINGS: CT CHEST FINDINGS

Cardiovascular: There is no cardiomegaly or pericardial effusion.
The thoracic aorta is unremarkable. The origins of the great vessels
of the aortic arch appear patent as visualized. The central
pulmonary arteries appear patent.

Mediastinum/Nodes: No hilar or mediastinal adenopathy. The esophagus
is grossly unremarkable. No mediastinal fluid collection.

Lungs/Pleura: Left lung base linear atelectasis/scarring. The lungs
are otherwise clear. There is no pleural effusion or pneumothorax.
The central airways are patent.

Musculoskeletal: No chest wall mass or suspicious bone lesions
identified.

CT ABDOMEN PELVIS FINDINGS

No intra-abdominal free air or free fluid.

Hepatobiliary: Subcentimeter right hepatic hypodense focus as well
as additional subcentimeter hypodense lesion in the dome of the
liver are too small to characterize. There is no intrahepatic
biliary ductal dilatation. There is a 1 cm stone in the gallbladder.
No pericholecystic fluid or evidence of acute cholecystitis by CT.

Pancreas: Unremarkable. No pancreatic ductal dilatation or
surrounding inflammatory changes.

Spleen: Normal in size without focal abnormality.

Adrenals/Urinary Tract: The adrenal glands are unremarkable. The
kidneys, visualized ureters, and urinary bladder appear
unremarkable.

Stomach/Bowel: Mild diffuse thickened appearance of the distal colon
and rectosigmoid may be partly related to underdistention. No
significant pericolonic stranding identified. However, mild colitis
is not excluded. Clinical correlation is recommended. There are
several small scattered sigmoid and distal colonic diverticula
without active inflammatory changes. There is no bowel obstruction
or active inflammation. The appendix is normal.

Vascular/Lymphatic: The abdominal aorta and IVC are unremarkable.
The SMV, splenic vein, and main portal vein are patent. No portal
venous gas. There is no adenopathy.

Reproductive: The uterus and ovaries are grossly unremarkable. No
adnexal masses.

Other: None

Musculoskeletal: Grade 1 L4-L5 anterolisthesis. No acute osseous
pathology.
IMPRESSION: 1. No acute intrathoracic pathology.
2. Underdistention versus mild colitis. Clinical correlation is
recommended. No bowel obstruction. Normal appendix.
3. Small scattered colonic diverticula.
4. Cholelithiasis.

## 2018-12-09 MED ORDER — IOHEXOL 9 MG/ML PO SOLN
500.0000 mL | ORAL | Status: AC
  Administered 2018-12-09: 1000 mL via ORAL

## 2018-12-09 MED ORDER — IOHEXOL 300 MG/ML  SOLN
100.0000 mL | Freq: Once | INTRAMUSCULAR | Status: AC | PRN
  Administered 2018-12-09: 100 mL via INTRAVENOUS

## 2018-12-09 MED ORDER — IOHEXOL 9 MG/ML PO SOLN
ORAL | Status: AC
  Filled 2018-12-09: qty 1000

## 2018-12-09 MED ORDER — SODIUM CHLORIDE (PF) 0.9 % IJ SOLN
INTRAMUSCULAR | Status: AC
  Filled 2018-12-09: qty 50

## 2018-12-09 MED ORDER — GADOBUTROL 1 MMOL/ML IV SOLN
6.0000 mL | Freq: Once | INTRAVENOUS | Status: AC | PRN
  Administered 2018-12-09: 6 mL via INTRAVENOUS

## 2018-12-15 ENCOUNTER — Other Ambulatory Visit: Payer: Self-pay | Admitting: Neurosurgery

## 2018-12-15 DIAGNOSIS — D496 Neoplasm of unspecified behavior of brain: Secondary | ICD-10-CM | POA: Diagnosis not present

## 2018-12-20 ENCOUNTER — Other Ambulatory Visit (HOSPITAL_COMMUNITY): Payer: BC Managed Care – PPO

## 2018-12-20 ENCOUNTER — Encounter (HOSPITAL_COMMUNITY): Payer: Self-pay

## 2018-12-20 ENCOUNTER — Encounter (HOSPITAL_COMMUNITY)
Admission: RE | Admit: 2018-12-20 | Discharge: 2018-12-20 | Disposition: A | Discharge status: Home / Self Care | Payer: BC Managed Care – PPO | Source: Ambulatory Visit | Attending: Neurosurgery | Admitting: Neurosurgery

## 2018-12-20 ENCOUNTER — Other Ambulatory Visit: Payer: Self-pay

## 2018-12-20 DIAGNOSIS — D496 Neoplasm of unspecified behavior of brain: Secondary | ICD-10-CM | POA: Diagnosis not present

## 2018-12-20 DIAGNOSIS — Z01812 Encounter for preprocedural laboratory examination: Secondary | ICD-10-CM | POA: Insufficient documentation

## 2018-12-20 HISTORY — DX: Hypothyroidism, unspecified: E03.9

## 2018-12-20 HISTORY — DX: Pure hypercholesterolemia, unspecified: E78.00

## 2018-12-20 HISTORY — DX: Pain in unspecified joint: M25.50

## 2018-12-20 LAB — BASIC METABOLIC PANEL
Anion gap: 8 (ref 5–15)
BUN: 16 mg/dL (ref 8–23)
CO2: 27 mmol/L (ref 22–32)
Calcium: 9.3 mg/dL (ref 8.9–10.3)
Chloride: 104 mmol/L (ref 98–111)
Creatinine, Ser: 1 mg/dL (ref 0.44–1.00)
GFR calc Af Amer: >60 mL/min (ref >60)
GFR calc non Af Amer: >60 mL/min (ref >60)
Glucose, Bld: 98 mg/dL (ref 70–99)
Potassium: 4 mmol/L (ref 3.5–5.1)
Sodium: 139 mmol/L (ref 135–145)

## 2018-12-20 LAB — CBC
HCT: 44 % (ref 36.0–46.0)
Hemoglobin: 14.5 g/dL (ref 12.0–15.0)
MCH: 28.8 pg (ref 26.0–34.0)
MCHC: 33 g/dL (ref 30.0–36.0)
MCV: 87.5 fL (ref 80.0–100.0)
Platelets: 345 10*3/uL (ref 150–400)
RBC: 5.03 MIL/uL (ref 3.87–5.11)
RDW: 11.9 % (ref 11.5–15.5)
WBC: 7.9 10*3/uL (ref 4.0–10.5)
nRBC: 0 % (ref 0.0–0.2)

## 2018-12-20 LAB — TYPE AND SCREEN
ABO/RH(D): O NEG
Antibody Screen: NEGATIVE

## 2018-12-20 LAB — ABO/RH: ABO/RH(D): O NEG

## 2018-12-20 NOTE — Progress Notes (Signed)
PCP - Dr. 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  Patient's mother is not in any contact with anyone else and does not leave her home.  [name] notified.  Patient's PAT appointment moved from 12/20/18 to 12/22/18.   Anesthesia review: n/a  Patient denies shortness of breath, fever, cough and chest pain at PAT appointment   All instructions explained to the patient, with a verbal understanding of the material. Patient agrees to go over the instructions while at home for a better understanding. Patient also instructed to self quarantine after being tested for COVID-19. The opportunity to ask questions was provided.

## 2018-12-20 NOTE — Progress Notes (Signed)
[facility] [place], [address] [place] AT [address] [place] [address] Phone: [phone] Fax: [phone]      Your procedure is scheduled on Friday, 12/23/2018.  Report to [facility] Main Entrance "A" at 11:15 A.M., and check in at the Admitting office.  Call this number if you have problems the morning of surgery:  [phone]  Call [phone] if you have any questions prior to your surgery date Monday-Friday 8am-4pm    Remember:  Do not eat or drink after midnight the night before your surgery     Take these medicines the morning of surgery with A SIP OF WATER: Euthyrox  Starting today, STOP taking any Aspirin (unless otherwise instructed by your surgeon), Aleve, Naproxen, Ibuprofen, Motrin, Advil, Goody's, BC's, all herbal medications, fish oil, and all vitamins.    The Morning of Surgery  Do not wear jewelry, make-up or nail polish.  Do not wear lotions, powders, perfumes, or deodorant  Do not shave 48 hours prior to surgery.  Men may shave face and neck.  Do not bring valuables to the hospital.  [facility] is not responsible for any belongings or valuables.  If you are a smoker, DO NOT Smoke 24 hours prior to surgery  If you wear a CPAP at night please bring your mask, tubing, and machine the morning of surgery   Remember that you must have someone to transport you home after your surgery, and remain with you for 24 hours if you are discharged the same day.   Please bring cases for contacts, glasses, hearing aids, dentures or bridgework because it cannot be worn into surgery.    Leave your suitcase in the car.  After surgery it may be brought to your room.  For patients admitted to the hospital, discharge time will be determined by your treatment team.  Patients discharged the day of surgery will not be allowed to drive home.    Special instructions:   [place]-  Preparing For Surgery  Before surgery, you can play an important role. Because skin is not sterile, your skin needs to be as free of germs as possible. You can reduce the number of germs on your skin by washing with CHG (chlorahexidine gluconate) Soap before surgery.  CHG is an antiseptic cleaner which kills germs and bonds with the skin to continue killing germs even after washing.    Oral Hygiene is also important to reduce your risk of infection.  Remember - BRUSH YOUR TEETH THE MORNING OF SURGERY WITH YOUR REGULAR TOOTHPASTE  Please do not use if you have an allergy to CHG or antibacterial soaps. If your skin becomes reddened/irritated stop using the CHG.  Do not shave (including legs and underarms) for at least 48 hours prior to first CHG shower. It is OK to shave your face.  Please follow these instructions carefully.   1. Shower the NIGHT BEFORE SURGERY and the MORNING OF SURGERY with CHG Soap.   2. If you chose to wash your hair, wash your hair first as usual with your normal shampoo.  3. After you shampoo, rinse your hair and body thoroughly to remove the shampoo.  4. Use CHG as you would any other liquid soap. You can apply CHG directly to the skin and wash gently with a scrungie or a clean washcloth.   5. Apply the CHG Soap to your body ONLY FROM THE NECK DOWN.  Do not use on open wounds or open sores. Avoid contact with your eyes, ears, mouth and genitals (private parts). Wash Face and genitals (private parts)  with your normal soap.   6. Wash thoroughly, paying special attention to the area where your surgery will be performed.  7. Thoroughly rinse your body with warm water from the neck down.  8. DO NOT shower/wash with your normal soap after using and rinsing off the CHG Soap.  9. Pat yourself dry with a CLEAN TOWEL.  10. Wear CLEAN PAJAMAS to bed the night before surgery, wear comfortable clothes the morning of surgery  11. Place CLEAN SHEETS on your bed the night of  your first shower and DO NOT SLEEP WITH PETS.    Day of Surgery:  Please shower the morning of surgery with the CHG soap Do not apply any deodorants/lotions. Please wear clean clothes to the hospital/surgery center.   Remember to brush your teeth WITH YOUR REGULAR TOOTHPASTE.   Please read over the following fact sheets that you were given.

## 2018-12-21 ENCOUNTER — Other Ambulatory Visit: Payer: Self-pay | Admitting: Neurosurgery

## 2018-12-22 ENCOUNTER — Other Ambulatory Visit (HOSPITAL_COMMUNITY)
Admission: RE | Admit: 2018-12-22 | Discharge: 2018-12-22 | Disposition: A | Discharge status: Home / Self Care | Payer: BC Managed Care – PPO | Source: Ambulatory Visit | Attending: Neurosurgery | Admitting: Neurosurgery

## 2018-12-22 DIAGNOSIS — Z20828 Contact with and (suspected) exposure to other viral communicable diseases: Secondary | ICD-10-CM | POA: Insufficient documentation

## 2018-12-22 DIAGNOSIS — Z888 Allergy status to other drugs, medicaments and biological substances status: Secondary | ICD-10-CM | POA: Diagnosis not present

## 2018-12-22 DIAGNOSIS — Z7982 Long term (current) use of aspirin: Secondary | ICD-10-CM | POA: Diagnosis not present

## 2018-12-22 DIAGNOSIS — C714 Malignant neoplasm of occipital lobe: Secondary | ICD-10-CM | POA: Diagnosis not present

## 2018-12-22 DIAGNOSIS — E7800 Pure hypercholesterolemia, unspecified: Secondary | ICD-10-CM | POA: Diagnosis not present

## 2018-12-22 DIAGNOSIS — Z01812 Encounter for preprocedural laboratory examination: Secondary | ICD-10-CM | POA: Insufficient documentation

## 2018-12-22 DIAGNOSIS — C718 Malignant neoplasm of overlapping sites of brain: Secondary | ICD-10-CM | POA: Diagnosis not present

## 2018-12-22 DIAGNOSIS — D496 Neoplasm of unspecified behavior of brain: Secondary | ICD-10-CM | POA: Diagnosis not present

## 2018-12-22 DIAGNOSIS — G936 Cerebral edema: Secondary | ICD-10-CM | POA: Diagnosis not present

## 2018-12-22 DIAGNOSIS — Z79899 Other long term (current) drug therapy: Secondary | ICD-10-CM | POA: Diagnosis not present

## 2018-12-22 DIAGNOSIS — C719 Malignant neoplasm of brain, unspecified: Secondary | ICD-10-CM | POA: Diagnosis not present

## 2018-12-22 DIAGNOSIS — E039 Hypothyroidism, unspecified: Secondary | ICD-10-CM | POA: Diagnosis not present

## 2018-12-22 LAB — SARS CORONAVIRUS 2 (TAT 6-24 HRS): SARS Coronavirus 2: NEGATIVE

## 2018-12-22 NOTE — H&P (Addendum)
Chief Complaint   Brain tumor  HPI   HPI: Jillian Gomez is a 63 y.o. female who was found to have a left occipital tumor during workup for vague visual disturbance.  She underwent metastatic workup which did not reveal any evidence of systemic malignancy.  She presents today for resection of the aforementioned tumor for diagnosis and treatment.  She is without any concerns.  There are no problems to display for this patient.   PMH: Past Medical History:  Diagnosis Date  . High cholesterol   . Hypothyroidism   . Joint pain     PSH: Past Surgical History:  Procedure Laterality Date  . BREAST BIOPSY Left   . COLONOSCOPY    . LASIK      No medications prior to admission.    SH: Social History   Tobacco Use  . Smoking status: Never Smoker  . Smokeless tobacco: Never Used  Substance Use Topics  . Alcohol use: Not Currently  . Drug use: Never    MEDS: Prior to Admission medications   Medication Sig Start Date End Date Taking? Authorizing Provider  aspirin 81 MG chewable tablet Chew 81 mg by mouth daily.   Yes [provider]  Biotin 1 MG CAPS Take 1 mg by mouth daily.   Yes [provider]  CALCIUM PO Take 1 tablet by mouth daily.   Yes [provider]  EUTHYROX 50 MCG tablet Take 50 mcg by mouth daily. 12/03/18  Yes [provider]  naproxen sodium (ALEVE) 220 MG tablet Take 220 mg by mouth daily as needed (pain).   Yes [provider]    ALLERGY: Allergies  Allergen Reactions  . Etodolac Other (See Comments)    GI upset  . Macrobid [Nitrofurantoin] Rash    Social History   Tobacco Use  . Smoking status: Never Smoker  . Smokeless tobacco: Never Used  Substance Use Topics  . Alcohol use: Not Currently     No family history on file.   ROS   ROS  Exam   There were no vitals filed for this visit. General appearance: WDWN, NAD Eyes: No scleral injection Cardiovascular: Regular rate and rhythm  without murmurs, rubs, gallops. No edema or variciosities. Distal pulses normal. Pulmonary: Effort normal, non-labored breathing Musculoskeletal:     Muscle tone upper extremities: Normal    Muscle tone lower extremities: Normal    Motor exam: Upper Extremities Deltoid Bicep Tricep Grip  Right 5/5 5/5 5/5 5/5  Left 5/5 5/5 5/5 5/5   Lower Extremity IP Quad PF DF EHL  Right 5/5 5/5 5/5 5/5 5/5  Left 5/5 5/5 5/5 5/5 5/5   Neurological Mental Status:    - Patient is awake, alert, oriented to person, place, month, year, and situation    - Patient is able to give a clear and coherent history.    - No signs of aphasia or neglect Cranial Nerves    - II:   Right homonymous hemianopsia by confrontation testing    - III/IV/VI: EOMI without ptosis or diploplia.     - V: Facial sensation is grossly normal    - VII: Facial movement is symmetric.     - VIII: hearing is intact to voice    - X: Uvula elevates symmetrically    - XI: Shoulder shrug is symmetric.    - XII: tongue is midline without atrophy or fasciculations.  Sensory: Sensation grossly intact to LT  Results - Imaging/Labs  Results for orders placed or performed during the hospital encounter of 12/20/18 (from the past 48 hour(s))  CBC     Status: None   Collection Time: 12/20/18 12:20 PM  Result Value Ref Range   WBC 7.9 4.0 - 10.5 K/uL   RBC 5.03 3.87 - 5.11 MIL/uL   Hemoglobin 14.5 12.0 - 15.0 g/dL   HCT 44.0 36.0 - 46.0 %   MCV 87.5 80.0 - 100.0 fL   MCH 28.8 26.0 - 34.0 pg   MCHC 33.0 30.0 - 36.0 g/dL   RDW 11.9 11.5 - 15.5 %   Platelets 345 150 - 400 K/uL   nRBC 0.0 0.0 - 0.2 %    Comment: Performed at [facility], [place] [address] [place], [place] [address]  Type and screen All Cardiac and thoracic surgeries, spinal fusions, myomectomies, craniotomies, colon & liver resections, total joint revisions, same day c-section with placenta previa or accreta.     Status: None   Collection Time: 12/20/18 12:20 PM    Result Value Ref Range   ABO/RH(D) O NEG    Antibody Screen NEG    Sample Expiration 01/03/2019,2359    Extend sample reason      NO TRANSFUSIONS OR PREGNANCY IN THE PAST 3 MONTHS Performed at [facility], [place] [address], [place], [place] [id number]   Basic metabolic panel     Status: None   Collection Time: 12/20/18 12:20 PM  Result Value Ref Range   Sodium 139 135 - 145 mmol/L   Potassium 4.0 3.5 - 5.1 mmol/L   Chloride 104 98 - 111 mmol/L   CO2 27 22 - 32 mmol/L   Glucose, Bld 98 70 - 99 mg/dL   BUN 16 8 - 23 mg/dL   Creatinine, Ser 1.00 0.44 - 1.00 mg/dL   Calcium 9.3 8.9 - 10.3 mg/dL   GFR calc non Af Amer >60 >60 mL/min   GFR calc Af Amer >60 >60 mL/min   Anion gap 8 5 - 15    Comment: Performed at [facility] [address], [place], [place] [address]  ABO/Rh     Status: None   Collection Time: 12/20/18 12:20 PM  Result Value Ref Range   ABO/RH(D)      O NEG Performed at [facility] [address], [place],  [address]     No results found.  IMAGING: Radiology report of CT chest abdomen and pelvis was reviewed and is negative for any evidence of systemic malignancy.  MRI of the brain with contrast was personally reviewed.  This demonstrates a peripherally enhancing lesion of the left parieto-occipital region with associated surrounding edema.  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   Impression/Plan   63 y.o. 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  There is no evidence of systemic malignancy.   After discussion at the multidisciplinary tumor conference, consensus opinion for optimal treatment was surgical resection. We will therefore proceed with stereotactic left parietal occipital craniotomy for resection of tumor.  I reviewed the indications for the surgery, the risks, and the expected postoperative course and outcomes in detail with the patient and  her sister in the office. All her questions today were answered and consent was obtained.   Consuella Lose, MD [facility]

## 2018-12-23 ENCOUNTER — Encounter (HOSPITAL_COMMUNITY)
Admission: RE | Disposition: A | Discharge status: Home / Self Care | Payer: Self-pay | Source: Home / Self Care | Attending: Neurosurgery

## 2018-12-23 ENCOUNTER — Other Ambulatory Visit: Payer: Self-pay

## 2018-12-23 ENCOUNTER — Encounter (HOSPITAL_COMMUNITY): Payer: Self-pay | Admitting: Neurosurgery

## 2018-12-23 ENCOUNTER — Inpatient Hospital Stay (HOSPITAL_COMMUNITY): Payer: BC Managed Care – PPO | Admitting: Certified Registered"

## 2018-12-23 ENCOUNTER — Inpatient Hospital Stay (HOSPITAL_COMMUNITY)
Admission: RE | Admit: 2018-12-23 | Discharge: 2018-12-27 | DRG: 025 | Disposition: A | Discharge status: Home / Self Care | Payer: BC Managed Care – PPO | Attending: Neurosurgery | Admitting: Neurosurgery

## 2018-12-23 DIAGNOSIS — Z79899 Other long term (current) drug therapy: Secondary | ICD-10-CM

## 2018-12-23 DIAGNOSIS — E7800 Pure hypercholesterolemia, unspecified: Secondary | ICD-10-CM | POA: Diagnosis present

## 2018-12-23 DIAGNOSIS — Z20828 Contact with and (suspected) exposure to other viral communicable diseases: Secondary | ICD-10-CM | POA: Diagnosis present

## 2018-12-23 DIAGNOSIS — G936 Cerebral edema: Secondary | ICD-10-CM | POA: Diagnosis present

## 2018-12-23 DIAGNOSIS — C719 Malignant neoplasm of brain, unspecified: Principal | ICD-10-CM | POA: Diagnosis present

## 2018-12-23 DIAGNOSIS — D496 Neoplasm of unspecified behavior of brain: Secondary | ICD-10-CM

## 2018-12-23 DIAGNOSIS — Z7982 Long term (current) use of aspirin: Secondary | ICD-10-CM | POA: Diagnosis not present

## 2018-12-23 DIAGNOSIS — E039 Hypothyroidism, unspecified: Secondary | ICD-10-CM | POA: Diagnosis present

## 2018-12-23 DIAGNOSIS — Z888 Allergy status to other drugs, medicaments and biological substances status: Secondary | ICD-10-CM

## 2018-12-23 HISTORY — PX: APPLICATION OF CRANIAL NAVIGATION: SHX6578

## 2018-12-23 HISTORY — PX: CRANIOTOMY: SHX93

## 2018-12-23 SURGERY — CRANIOTOMY TUMOR EXCISION
Anesthesia: General | Site: Head | Laterality: Left

## 2018-12-23 MED ORDER — POLYETHYLENE GLYCOL 3350 17 G PO PACK: 17.0000 g | PACK | Freq: Every day | ORAL | Status: DC | PRN

## 2018-12-23 MED ORDER — LEVETIRACETAM IN NACL 1000 MG/100ML IV SOLN
1000.0000 mg | INTRAVENOUS | Status: AC
  Administered 2018-12-23: 1000 mg via INTRAVENOUS
  Filled 2018-12-23: qty 100

## 2018-12-23 MED ORDER — LIDOCAINE 2% (20 MG/ML) 5 ML SYRINGE
INTRAMUSCULAR | Status: DC | PRN
  Administered 2018-12-23: 60 mg via INTRAVENOUS

## 2018-12-23 MED ORDER — ACETAMINOPHEN 650 MG RE SUPP: 650.0000 mg | RECTAL | Status: DC | PRN

## 2018-12-23 MED ORDER — LEVETIRACETAM IN NACL 500 MG/100ML IV SOLN
500.0000 mg | Freq: Two times a day (BID) | INTRAVENOUS | Status: DC
  Administered 2018-12-23 – 2018-12-25 (×5): 500 mg via INTRAVENOUS
  Filled 2018-12-23 (×5): qty 100

## 2018-12-23 MED ORDER — PROPOFOL 10 MG/ML IV BOLUS
INTRAVENOUS | Status: DC | PRN
  Administered 2018-12-23: 30 mg via INTRAVENOUS
  Administered 2018-12-23: 100 mg via INTRAVENOUS

## 2018-12-23 MED ORDER — ONDANSETRON HCL 4 MG PO TABS
4.0000 mg | ORAL_TABLET | ORAL | Status: DC | PRN
  Filled 2018-12-23 (×2): qty 1

## 2018-12-23 MED ORDER — THROMBIN 5000 UNITS EX SOLR: OROMUCOSAL | Status: DC | PRN

## 2018-12-23 MED ORDER — ONDANSETRON HCL 4 MG/2ML IJ SOLN
4.0000 mg | INTRAMUSCULAR | Status: DC | PRN
  Administered 2018-12-24 – 2018-12-25 (×2): 4 mg via INTRAVENOUS
  Filled 2018-12-23 (×2): qty 2

## 2018-12-23 MED ORDER — FENTANYL CITRATE (PF) 250 MCG/5ML IJ SOLN
INTRAMUSCULAR | Status: AC
  Filled 2018-12-23: qty 5

## 2018-12-23 MED ORDER — MEPERIDINE HCL 25 MG/ML IJ SOLN: 6.2500 mg | INTRAMUSCULAR | Status: DC | PRN

## 2018-12-23 MED ORDER — BACITRACIN ZINC 500 UNIT/GM EX OINT
TOPICAL_OINTMENT | CUTANEOUS | Status: DC | PRN
  Administered 2018-12-23 (×2): 1 via TOPICAL

## 2018-12-23 MED ORDER — PROMETHAZINE HCL 25 MG PO TABS: 12.5000 mg | ORAL_TABLET | ORAL | Status: DC | PRN

## 2018-12-23 MED ORDER — ROCURONIUM BROMIDE 10 MG/ML (PF) SYRINGE
PREFILLED_SYRINGE | INTRAVENOUS | Status: DC | PRN
  Administered 2018-12-23: 50 mg via INTRAVENOUS

## 2018-12-23 MED ORDER — FLEET ENEMA 7-19 GM/118ML RE ENEM: 1.0000 | ENEMA | Freq: Once | RECTAL | Status: DC | PRN

## 2018-12-23 MED ORDER — ONDANSETRON HCL 4 MG/2ML IJ SOLN
INTRAMUSCULAR | Status: AC
  Filled 2018-12-23: qty 2

## 2018-12-23 MED ORDER — SODIUM CHLORIDE 0.9 % IV SOLN
0.0500 ug/kg/min | INTRAVENOUS | Status: AC
  Administered 2018-12-23: .2 ug/kg/min via INTRAVENOUS
  Filled 2018-12-23: qty 4000

## 2018-12-23 MED ORDER — FENTANYL CITRATE (PF) 100 MCG/2ML IJ SOLN
INTRAMUSCULAR | Status: DC | PRN
  Administered 2018-12-23: 100 ug via INTRAVENOUS

## 2018-12-23 MED ORDER — THROMBIN 20000 UNITS EX SOLR
CUTANEOUS | Status: AC
  Filled 2018-12-23: qty 20000

## 2018-12-23 MED ORDER — SUGAMMADEX SODIUM 200 MG/2ML IV SOLN
INTRAVENOUS | Status: DC | PRN
  Administered 2018-12-23: 100 mg via INTRAVENOUS

## 2018-12-23 MED ORDER — BUPIVACAINE HCL (PF) 0.5 % IJ SOLN
INTRAMUSCULAR | Status: DC | PRN
  Administered 2018-12-23: 4 mL

## 2018-12-23 MED ORDER — ROCURONIUM BROMIDE 10 MG/ML (PF) SYRINGE
PREFILLED_SYRINGE | INTRAVENOUS | Status: AC
  Filled 2018-12-23: qty 10

## 2018-12-23 MED ORDER — CEFAZOLIN SODIUM-DEXTROSE 2-4 GM/100ML-% IV SOLN
2.0000 g | INTRAVENOUS | Status: AC
  Administered 2018-12-23: 2 g via INTRAVENOUS

## 2018-12-23 MED ORDER — OXYCODONE HCL 5 MG PO TABS
5.0000 mg | ORAL_TABLET | ORAL | Status: DC | PRN
  Administered 2018-12-24 (×2): 10 mg via ORAL
  Administered 2018-12-24 – 2018-12-25 (×4): 5 mg via ORAL
  Filled 2018-12-23: qty 1
  Filled 2018-12-23: qty 2
  Filled 2018-12-23: qty 1
  Filled 2018-12-23: qty 2
  Filled 2018-12-23 (×2): qty 1
  Filled 2018-12-23: qty 2

## 2018-12-23 MED ORDER — PROMETHAZINE HCL 25 MG/ML IJ SOLN: 6.2500 mg | INTRAMUSCULAR | Status: DC | PRN

## 2018-12-23 MED ORDER — DEXAMETHASONE SODIUM PHOSPHATE 10 MG/ML IJ SOLN
INTRAMUSCULAR | Status: AC
  Filled 2018-12-23: qty 1

## 2018-12-23 MED ORDER — THROMBIN 5000 UNITS EX SOLR
CUTANEOUS | Status: AC
  Filled 2018-12-23: qty 5000

## 2018-12-23 MED ORDER — LABETALOL HCL 5 MG/ML IV SOLN: 10.0000 mg | INTRAVENOUS | Status: DC | PRN

## 2018-12-23 MED ORDER — PROPOFOL 500 MG/50ML IV EMUL
INTRAVENOUS | Status: DC | PRN
  Administered 2018-12-23: 50 ug/kg/min via INTRAVENOUS

## 2018-12-23 MED ORDER — 0.9 % SODIUM CHLORIDE (POUR BTL) OPTIME
TOPICAL | Status: DC | PRN
  Administered 2018-12-23 (×3): 1000 mL

## 2018-12-23 MED ORDER — LIDOCAINE-EPINEPHRINE 1 %-1:100000 IJ SOLN
INTRAMUSCULAR | Status: DC | PRN
  Administered 2018-12-23: 4 mL

## 2018-12-23 MED ORDER — BUPIVACAINE HCL (PF) 0.5 % IJ SOLN
INTRAMUSCULAR | Status: AC
  Filled 2018-12-23: qty 30

## 2018-12-23 MED ORDER — HYDROMORPHONE HCL 1 MG/ML IJ SOLN
0.2500 mg | INTRAMUSCULAR | Status: DC | PRN
  Administered 2018-12-23: 0.25 mg via INTRAVENOUS
  Administered 2018-12-23: 0.5 mg via INTRAVENOUS

## 2018-12-23 MED ORDER — CHLORHEXIDINE GLUCONATE CLOTH 2 % EX PADS: 6.0000 | MEDICATED_PAD | Freq: Once | CUTANEOUS | Status: AC

## 2018-12-23 MED ORDER — HYDROMORPHONE HCL 1 MG/ML IJ SOLN
0.5000 mg | INTRAMUSCULAR | Status: DC | PRN
  Administered 2018-12-24 (×3): 1 mg via INTRAVENOUS
  Administered 2018-12-25: 0.5 mg via INTRAVENOUS
  Filled 2018-12-23 (×5): qty 1

## 2018-12-23 MED ORDER — SODIUM CHLORIDE 0.9 % IV SOLN: INTRAVENOUS | Status: DC

## 2018-12-23 MED ORDER — LIDOCAINE 2% (20 MG/ML) 5 ML SYRINGE
INTRAMUSCULAR | Status: AC
  Filled 2018-12-23: qty 5

## 2018-12-23 MED ORDER — HYDROMORPHONE HCL 1 MG/ML IJ SOLN
INTRAMUSCULAR | Status: AC
  Filled 2018-12-23: qty 0.5

## 2018-12-23 MED ORDER — BACITRACIN ZINC 500 UNIT/GM EX OINT
TOPICAL_OINTMENT | CUTANEOUS | Status: AC
  Filled 2018-12-23: qty 28.35

## 2018-12-23 MED ORDER — PHENYLEPHRINE 40 MCG/ML (10ML) SYRINGE FOR IV PUSH (FOR BLOOD PRESSURE SUPPORT)
PREFILLED_SYRINGE | INTRAVENOUS | Status: AC
  Filled 2018-12-23: qty 10

## 2018-12-23 MED ORDER — DEXAMETHASONE SODIUM PHOSPHATE 10 MG/ML IJ SOLN
INTRAMUSCULAR | Status: DC | PRN
  Administered 2018-12-23: 10 mg via INTRAVENOUS

## 2018-12-23 MED ORDER — OXYCODONE HCL 5 MG PO TABS: 5.0000 mg | ORAL_TABLET | Freq: Once | ORAL | Status: DC | PRN

## 2018-12-23 MED ORDER — HYDROCODONE-ACETAMINOPHEN 5-325 MG PO TABS
1.0000 | ORAL_TABLET | ORAL | Status: DC | PRN
  Administered 2018-12-23 – 2018-12-26 (×2): 1 via ORAL
  Filled 2018-12-23 (×4): qty 1

## 2018-12-23 MED ORDER — CEFAZOLIN SODIUM-DEXTROSE 2-4 GM/100ML-% IV SOLN
INTRAVENOUS | Status: AC
  Filled 2018-12-23: qty 100

## 2018-12-23 MED ORDER — OXYCODONE HCL 5 MG/5ML PO SOLN: 5.0000 mg | Freq: Once | ORAL | Status: DC | PRN

## 2018-12-23 MED ORDER — PHENYLEPHRINE HCL-NACL 10-0.9 MG/250ML-% IV SOLN
INTRAVENOUS | Status: DC | PRN
  Administered 2018-12-23: 20 ug/min via INTRAVENOUS

## 2018-12-23 MED ORDER — HYDROMORPHONE HCL 1 MG/ML IJ SOLN
INTRAMUSCULAR | Status: AC
  Filled 2018-12-23: qty 1

## 2018-12-23 MED ORDER — BISACODYL 5 MG PO TBEC: 5.0000 mg | DELAYED_RELEASE_TABLET | Freq: Every day | ORAL | Status: DC | PRN

## 2018-12-23 MED ORDER — NALOXONE HCL 0.4 MG/ML IJ SOLN: 0.0800 mg | INTRAMUSCULAR | Status: DC | PRN

## 2018-12-23 MED ORDER — HYDROMORPHONE HCL 1 MG/ML IJ SOLN
INTRAMUSCULAR | Status: DC | PRN
  Administered 2018-12-23: .25 mg via INTRAVENOUS

## 2018-12-23 MED ORDER — HEMOSTATIC AGENTS (NO CHARGE) OPTIME
TOPICAL | Status: DC | PRN
  Administered 2018-12-23 (×2): 1 via TOPICAL

## 2018-12-23 MED ORDER — SENNA 8.6 MG PO TABS
1.0000 | ORAL_TABLET | Freq: Two times a day (BID) | ORAL | Status: DC
  Administered 2018-12-24 – 2018-12-27 (×7): 8.6 mg via ORAL
  Filled 2018-12-23 (×7): qty 1

## 2018-12-23 MED ORDER — PANTOPRAZOLE SODIUM 40 MG IV SOLR
40.0000 mg | Freq: Every day | INTRAVENOUS | Status: DC
  Administered 2018-12-23 – 2018-12-25 (×3): 40 mg via INTRAVENOUS
  Filled 2018-12-23 (×3): qty 40

## 2018-12-23 MED ORDER — ONDANSETRON HCL 4 MG/2ML IJ SOLN
INTRAMUSCULAR | Status: DC | PRN
  Administered 2018-12-23: 4 mg via INTRAVENOUS

## 2018-12-23 MED ORDER — LEVOTHYROXINE SODIUM 50 MCG PO TABS
50.0000 ug | ORAL_TABLET | Freq: Every day | ORAL | Status: DC
  Administered 2018-12-24 – 2018-12-27 (×4): 50 ug via ORAL
  Filled 2018-12-23 (×4): qty 1

## 2018-12-23 MED ORDER — THROMBIN 20000 UNITS EX SOLR: CUTANEOUS | Status: DC | PRN

## 2018-12-23 MED ORDER — LIDOCAINE-EPINEPHRINE 1 %-1:100000 IJ SOLN
INTRAMUSCULAR | Status: AC
  Filled 2018-12-23: qty 1

## 2018-12-23 MED ORDER — PROPOFOL 1000 MG/100ML IV EMUL
INTRAVENOUS | Status: AC
  Filled 2018-12-23: qty 100

## 2018-12-23 MED ORDER — ACETAMINOPHEN 325 MG PO TABS
650.0000 mg | ORAL_TABLET | ORAL | Status: DC | PRN
  Filled 2018-12-23: qty 2

## 2018-12-23 MED ORDER — CEFAZOLIN SODIUM-DEXTROSE 2-4 GM/100ML-% IV SOLN
2.0000 g | Freq: Three times a day (TID) | INTRAVENOUS | Status: AC
  Administered 2018-12-23 – 2018-12-24 (×2): 2 g via INTRAVENOUS
  Filled 2018-12-23 (×2): qty 100

## 2018-12-23 MED ORDER — SODIUM CHLORIDE 0.9 % IR SOLN
Status: DC | PRN
  Administered 2018-12-23: 1000 mL

## 2018-12-23 SURGICAL SUPPLY — 87 items
BAND INSRT 18 STRL LF DISP RB (MISCELLANEOUS)
BAND RUBBER #18 3X1/16 STRL (MISCELLANEOUS) IMPLANT
BATTERY IQ STERILE (MISCELLANEOUS) ×1 IMPLANT
BLADE CLIPPER SURG (BLADE) ×3 IMPLANT
BLADE SAW GIGLI 16 STRL (MISCELLANEOUS) IMPLANT
BNDG CMPR 75X41 PLY ABS (GAUZE/BANDAGES/DRESSINGS) ×2
BNDG GAUZE ELAST 4 BULKY (GAUZE/BANDAGES/DRESSINGS) ×2 IMPLANT
BNDG STRETCH 4X75 NS LF (GAUZE/BANDAGES/DRESSINGS) ×1 IMPLANT
BTRY SRG DRVR 1.5 IQ (MISCELLANEOUS) ×2
BUR ACORN 6.0 PRECISION (BURR) ×3 IMPLANT
BUR ROUND FLUTED 4 SOFT TCH (BURR) IMPLANT
BUR SPIRAL ROUTER 2.3 (BUR) ×3 IMPLANT
CANISTER SUCT 3000ML PPV (MISCELLANEOUS) ×6 IMPLANT
CARTRIDGE OIL MAESTRO DRILL (MISCELLANEOUS) ×2 IMPLANT
CONT SPEC 4OZ CLIKSEAL STRL BL (MISCELLANEOUS) ×3 IMPLANT
COVER WAND RF STERILE (DRAPES) ×3 IMPLANT
DIFFUSER DRILL AIR PNEUMATIC (MISCELLANEOUS) ×3 IMPLANT
DRAPE HALF SHEET 40X57 (DRAPES) ×3 IMPLANT
DRAPE MICROSCOPE LEICA (MISCELLANEOUS) ×1 IMPLANT
DRAPE NEUROLOGICAL W/INCISE (DRAPES) ×3 IMPLANT
DRAPE SURG 17X23 STRL (DRAPES) IMPLANT
DRAPE WARM FLUID 44X44 (DRAPES) ×3 IMPLANT
DRSG ADAPTIC 3X8 NADH LF (GAUZE/BANDAGES/DRESSINGS) IMPLANT
DRSG TELFA 3X8 NADH (GAUZE/BANDAGES/DRESSINGS) ×3 IMPLANT
DURAPREP 6ML APPLICATOR 50/CS (WOUND CARE) ×3 IMPLANT
ELECT REM PT RETURN 9FT ADLT (ELECTROSURGICAL) ×3
ELECTRODE REM PT RTRN 9FT ADLT (ELECTROSURGICAL) ×2 IMPLANT
EVACUATOR 1/8 PVC DRAIN (DRAIN) IMPLANT
EVACUATOR SILICONE 100CC (DRAIN) IMPLANT
FORCEPS BIPOLAR SPETZLER 8 1.0 (NEUROSURGERY SUPPLIES) ×3 IMPLANT
GAUZE 4X4 16PLY RFD (DISPOSABLE) IMPLANT
GAUZE SPONGE 4X4 12PLY STRL (GAUZE/BANDAGES/DRESSINGS) ×3 IMPLANT
GLOVE BIO SURGEON STRL SZ7.5 (GLOVE) IMPLANT
GLOVE BIOGEL PI IND STRL 7.0 (GLOVE) IMPLANT
GLOVE BIOGEL PI IND STRL 7.5 (GLOVE) ×4 IMPLANT
GLOVE BIOGEL PI INDICATOR 7.0 (GLOVE)
GLOVE BIOGEL PI INDICATOR 7.5 (GLOVE) ×2
GLOVE ECLIPSE 7.0 STRL STRAW (GLOVE) ×6 IMPLANT
GLOVE EXAM NITRILE XL STR (GLOVE) IMPLANT
GOWN STRL REUS W/ TWL LRG LVL3 (GOWN DISPOSABLE) ×4 IMPLANT
GOWN STRL REUS W/ TWL XL LVL3 (GOWN DISPOSABLE) IMPLANT
GOWN STRL REUS W/TWL 2XL LVL3 (GOWN DISPOSABLE) IMPLANT
GOWN STRL REUS W/TWL LRG LVL3 (GOWN DISPOSABLE) ×6
GOWN STRL REUS W/TWL XL LVL3 (GOWN DISPOSABLE)
HEMOSTAT POWDER KIT SURGIFOAM (HEMOSTASIS) ×3 IMPLANT
HEMOSTAT SURGICEL 2X14 (HEMOSTASIS) ×3 IMPLANT
IV NS 1000ML (IV SOLUTION) ×3
IV NS 1000ML BAXH (IV SOLUTION) ×2 IMPLANT
KIT BASIN OR (CUSTOM PROCEDURE TRAY) ×3 IMPLANT
KIT DRAIN CSF ACCUDRAIN (MISCELLANEOUS) IMPLANT
KIT TURNOVER KIT B (KITS) ×3 IMPLANT
KNIFE ARACHNOID DISP AM-24-S (MISCELLANEOUS) ×3 IMPLANT
MARKER SPHERE PSV REFLC 13MM (MARKER) ×6 IMPLANT
NDL SPNL 18GX3.5 QUINCKE PK (NEEDLE) IMPLANT
NEEDLE HYPO 22GX1.5 SAFETY (NEEDLE) ×3 IMPLANT
NEEDLE SPNL 18GX3.5 QUINCKE PK (NEEDLE) IMPLANT
NS IRRIG 1000ML POUR BTL (IV SOLUTION) ×9 IMPLANT
OIL CARTRIDGE MAESTRO DRILL (MISCELLANEOUS) ×3
PACK CRANIOTOMY CUSTOM (CUSTOM PROCEDURE TRAY) ×3 IMPLANT
PAD DRESSING TELFA 3X8 NADH (GAUZE/BANDAGES/DRESSINGS) IMPLANT
PATTIES SURGICAL .25X.25 (GAUZE/BANDAGES/DRESSINGS) IMPLANT
PATTIES SURGICAL .5 X.5 (GAUZE/BANDAGES/DRESSINGS) IMPLANT
PATTIES SURGICAL .5 X3 (DISPOSABLE) IMPLANT
PATTIES SURGICAL 1/4 X 3 (GAUZE/BANDAGES/DRESSINGS) IMPLANT
PATTIES SURGICAL 1X1 (DISPOSABLE) IMPLANT
PIN MAYFIELD SKULL DISP (PIN) ×3 IMPLANT
PLATE 1.5/0.5 18.5MM BURR HOLE (Plate) ×4 IMPLANT
SCREW SELF DRILL HT 1.5/4MM (Screw) ×20 IMPLANT
SET CARTRIDGE AND TUBING (SET/KITS/TRAYS/PACK) ×1 IMPLANT
SPECIMEN JAR SMALL (MISCELLANEOUS) IMPLANT
SPONGE SURGIFOAM ABS GEL 100 (HEMOSTASIS) ×4 IMPLANT
STAPLER VISISTAT 35W (STAPLE) ×3 IMPLANT
SUT ETHILON 3 0 PS 1 (SUTURE) IMPLANT
SUT NURALON 4 0 TR CR/8 (SUTURE) ×9 IMPLANT
SUT SILK 0 TIES 10X30 (SUTURE) IMPLANT
SUT VIC AB 0 CT1 18XCR BRD8 (SUTURE) ×4 IMPLANT
SUT VIC AB 0 CT1 8-18 (SUTURE) ×6
SUT VIC AB 3-0 SH 8-18 (SUTURE) ×6 IMPLANT
TAPE CLOTH 1X10 TAN NS (GAUZE/BANDAGES/DRESSINGS) ×1 IMPLANT
TIP STANDARD 36KHZ (INSTRUMENTS) ×3
TIP STD 36KHZ (INSTRUMENTS) IMPLANT
TOWEL GREEN STERILE (TOWEL DISPOSABLE) ×3 IMPLANT
TOWEL GREEN STERILE FF (TOWEL DISPOSABLE) ×3 IMPLANT
TRAY FOLEY MTR SLVR 16FR STAT (SET/KITS/TRAYS/PACK) ×3 IMPLANT
TUBE CONNECTING 12X1/4 (SUCTIONS) ×3 IMPLANT
WATER STERILE IRR 1000ML POUR (IV SOLUTION) ×3 IMPLANT
WRENCH TORQUE 36KHZ (INSTRUMENTS) ×1 IMPLANT

## 2018-12-23 NOTE — Brief Op Note (Signed)
12/23/2018  4:11 PM  PATIENT:  Jillian Gomez  63 y.o. female  PRE-OPERATIVE DIAGNOSIS:  BRAIN TUMOR  POST-OPERATIVE DIAGNOSIS:  BRAIN TUMOR  PROCEDURE:  Procedure(s) with comments: STEREOTACTIC LEFT OCCIPITAL CRANIOTOMY FOR RESECTION OF TUMOR (Left) - STEREOTACTIC LEFT OCCIPITAL CRANIOTOMY FOR RESECTION OF TUMOR APPLICATION OF CRANIAL NAVIGATION (Left) - APPLICATION OF CRANIAL NAVIGATION  SURGEON:  Surgeon(s) and Role:    * Consuella Lose, MD - Primary  PHYSICIAN ASSISTANT: Ferne Reus, PA-C  ANESTHESIA:   general  EBL:  100 mL   BLOOD ADMINISTERED:none  DRAINS: none   LOCAL MEDICATIONS USED:  MARCAINE    and XYLOCAINE   SPECIMEN:  Source of Specimen:  brain  DISPOSITION OF SPECIMEN:  PATHOLOGY  COUNTS:  YES  TOURNIQUET:  * No tourniquets in log *  DICTATION: .Note written in [place]: Admit to inpatient   PATIENT DISPOSITION:  PACU - hemodynamically stable.   Delay start of Pharmacological VTE agent (>24hrs) due to surgical blood loss or risk of bleeding: yes

## 2018-12-23 NOTE — Anesthesia Procedure Notes (Signed)
Procedure Name: Intubation Date/Time: 12/23/2018 1:28 PM Performed by: Imagene Riches, CRNA Pre-anesthesia Checklist: Patient identified, Emergency Drugs available, Suction available and Patient being monitored Patient Re-evaluated:Patient Re-evaluated prior to induction Oxygen Delivery Method: Circle System Utilized Preoxygenation: Pre-oxygenation with 100% oxygen Induction Type: IV induction Ventilation: Mask ventilation without difficulty Laryngoscope Size: Miller and 2 Grade View: Grade I Tube type: Oral Tube size: 7.0 mm Number of attempts: 1 Airway Equipment and Method: Stylet and Oral airway Placement Confirmation: ETT inserted through vocal cords under direct vision,  positive ETCO2 and breath sounds checked- equal and bilateral Secured at: 19 cm Tube secured with: Tape Dental Injury: Teeth and Oropharynx as per pre-operative assessment

## 2018-12-23 NOTE — Op Note (Signed)
NEUROSURGERY OPERATIVE NOTE   PREOP DIAGNOSIS:  1. Left pareito-occipital tumor   POSTOP DIAGNOSIS: Same  PROCEDURE: 1. Stereotactic left parietal occipital craniotomy for resection of tumor 2. Use of intraoperative microscope for microdissection  SURGEON: Dr. Consuella Lose, MD  ASSISTANT: Primus Bravo, PA-C  ANESTHESIA: General Endotracheal  EBL: 100cc  SPECIMENS: Left parieto-occipital tumor for permanent pathology  DRAINS: None  COMPLICATIONS: None immediate  CONDITION: Dynamically stable to postanesthesia care unit  HISTORY: Jillian Gomez is a 63 y.o. female initially seen in the outpatient neurosurgery clinic after she presented to her primary doctor with visual complaints.  MRI scan was ordered without contrast which demonstrated the presence of a left parieto-occipital intra-axial lesion.  Further work-up was undertaken including CT scan of the chest abdomen pelvis which was negative for any systemic malignancy while MRI of the brain with contrast revealed a peripherally enhancing lesion consistent with high-grade glioma.  Her case was discussed at the multidisciplinary neuro-oncology conference and consensus opinion was to proceed with surgical resection.  The risks and benefits of the surgery were reviewed in detail with the patient and her sister.  After all questions were answered informed consent was obtained and witnessed.  PROCEDURE IN DETAIL: The patient was brought to the operating room. After induction of general anesthesia, the patient was positioned on the operative table in the right lateral decubitus position in the [place] head holder. All pressure points were meticulously padded.  The preoperative stereotactic MRI scan was then coregistered with surface markers until a satisfactory accuracy was achieved.  The surface projection of the distal portion of the superior sagittal sinus and transverse sinus were marked out.  Surface projection of the tumor  was also marked out and allowed planning of a sigmoid shaped skin incision in a paramedian fashion to allow access to the tumor.  Skin incision was then marked out and prepped and draped in the usual sterile fashion.  After timeout was conducted, the skin incision was infiltrated with local anesthetic with epinephrine.  Incision was then made sharply and carried down through the galea.  Raney clips were applied for hemostasis on the skin edges.  The periosteum was then elevated and self-retaining retractors were placed.  The stereotactic system was again used to plan out a craniotomy to allow access to the tumor superior to the transverse sinus and just lateral to the superior sagittal sinus.  Multiple bur holes were then created and connected with the craniotome.  A single piece craniotomy flap was then elevated.  Hemostasis was secured on the epidural plane with bipolar electrocautery.  The dura was then incised in cruciate fashion and tacked up with 4 Nurolon stitches.  At this point the microscope was draped sterilely and brought into the field and the remainder of the case was done on the microscope using microdissection.  The portion of the tumor which was most superficial was identified visually slightly darker and more red than the surrounding brain.  This was confirmed with the intraoperative stereotactic system.  The pia was coagulated with the bipolar and incised, and using a combination of the bipolar electrocautery, blunt dissection, and the ultrasonic aspirator, I was able to develop a plane between the tumor which was more firm in consistency and slightly more pink in color compared to the surrounding white matter.  In this way the tumor was dissected away from the surrounding white matter circumferentially and removed en bloc.  The specimen was sent for permanent pathology.  The margins of  the tumor were then checked both visually and with the stereotactic system and there did not appear to be  any tumor remaining.  In fact, and removing the most deep portion of the tumor, I did enter the occipital horn indicating the deep margin of the resection.  At this point hemostasis on the tumor bed was secured with a combination of bipolar electrocautery and morselized Gelfoam and thrombin.  The hole in the occipital horn was then covered with a 1 x 1 piece of Gelfoam.  The resection cavity was also covered with a layer of Surgicel.  The dura was then closed in a watertight fashion with a running 4-0 Nurolon stitch.  This was covered with a large piece of Gelfoam.  The bone flap was then replaced and plated with standard titanium plates and screws.  The galea was then closed with interrupted 0 and 3-0 Vicryl stitches and the skin was closed with staples.  Bacitracin ointment and sterile dressing was applied.  Head wrap was placed.  The patient was then removed from the [place] head holder and transferred to the stretcher.  She was extubated and taken to postanesthesia care unit in stable hemodynamic condition.  At the end of the case all sponge, needle, instrument, and cottonoid counts were correct.

## 2018-12-23 NOTE — Progress Notes (Signed)
Paged Neurosurgery on call to see if patient could advance diet from NPO. Patient does not complain of any nausea and has tolerated ice chips and sips with meds. Verbal order to advance to clear liquid as tolerated.

## 2018-12-23 NOTE — Anesthesia Procedure Notes (Signed)
Arterial Line Insertion Start/End12/18/2020 12:40 PM, 12/23/2018 12:55 PM Performed by: Imagene Riches, CRNA, CRNA  Patient location: Pre-op. Preanesthetic checklist: patient identified, IV checked, site marked, risks and benefits discussed, surgical consent, monitors and equipment checked, pre-op evaluation, timeout performed and anesthesia consent Right, radial was placed Catheter size: 20 G Hand hygiene performed  and Seldinger technique used Allen's test indicative of satisfactory collateral circulation Attempts: 1 Procedure performed without using ultrasound guided technique. Following insertion, dressing applied and Biopatch. Post procedure assessment: normal  Patient tolerated the procedure well with no immediate complications.

## 2018-12-23 NOTE — Anesthesia Preprocedure Evaluation (Signed)
Anesthesia Evaluation  Patient identified by MRN, date of birth, ID band Patient awake    Reviewed: Allergy & Precautions, NPO status , Patient's Chart, lab work & pertinent test results  Airway Mallampati: II  TM Distance: >3 FB Neck ROM: Full    Dental no notable dental hx.    Pulmonary neg pulmonary ROS,    Pulmonary exam normal breath sounds clear to auscultation       Cardiovascular negative cardio ROS Normal cardiovascular exam Rhythm:Regular Rate:Normal     Neuro/Psych negative neurological ROS  negative psych ROS   GI/Hepatic negative GI ROS, Neg liver ROS,   Endo/Other  Hypothyroidism   Renal/GU negative Renal ROS  negative genitourinary   Musculoskeletal negative musculoskeletal ROS (+)   Abdominal   Peds negative pediatric ROS (+)  Hematology negative hematology ROS (+)   Anesthesia Other Findings Brain tumor  Reproductive/Obstetrics negative OB ROS                             Anesthesia Physical Anesthesia Plan  ASA: III  Anesthesia Plan: General   Post-op Pain Management:    Induction: Intravenous  PONV Risk Score and Plan: 3 and Ondansetron, Dexamethasone and Midazolam  Airway Management Planned: Oral ETT  Additional Equipment: Arterial line  Intra-op Plan:   Post-operative Plan: Extubation in OR  Informed Consent: I have reviewed the patients History and Physical, chart, labs and discussed the procedure including the risks, benefits and alternatives for the proposed anesthesia with the patient or authorized representative who has indicated his/her understanding and acceptance.     Dental advisory given  Plan Discussed with: CRNA  Anesthesia Plan Comments:         Anesthesia Quick Evaluation

## 2018-12-23 NOTE — Transfer of Care (Signed)
Immediate Anesthesia Transfer of Care Note  Patient: Jillian Gomez  Procedure(s) Performed: STEREOTACTIC LEFT OCCIPITAL CRANIOTOMY FOR RESECTION OF TUMOR (Left Head) APPLICATION OF CRANIAL NAVIGATION (Left )  Patient Location: PACU  Anesthesia Type:General  Level of Consciousness: drowsy  Airway & Oxygen Therapy: Patient Spontanous Breathing and Patient connected to nasal cannula oxygen  Post-op Assessment: Report given to RN and Post -op Vital signs reviewed and stable  Post vital signs: Reviewed and stable  Last Vitals:  Vitals Value Taken Time  BP 147/120 12/23/18 1626  Temp    Pulse 106 12/23/18 1629  Resp 20 12/23/18 1629  SpO2 98 % 12/23/18 1629  Vitals shown include unvalidated device data.  Last Pain:  Vitals:   12/23/18 1210  PainSc: 0-No pain         Complications: No apparent anesthesia complications

## 2018-12-24 ENCOUNTER — Inpatient Hospital Stay (HOSPITAL_COMMUNITY): Payer: BC Managed Care – PPO

## 2018-12-24 LAB — BASIC METABOLIC PANEL
Anion gap: 11 (ref 5–15)
BUN: 13 mg/dL (ref 8–23)
CO2: 21 mmol/L — ABNORMAL LOW (ref 22–32)
Calcium: 8.7 mg/dL — ABNORMAL LOW (ref 8.9–10.3)
Chloride: 105 mmol/L (ref 98–111)
Creatinine, Ser: 1.19 mg/dL — ABNORMAL HIGH (ref 0.44–1.00)
GFR calc Af Amer: 57 mL/min — ABNORMAL LOW (ref >60)
GFR calc non Af Amer: 49 mL/min — ABNORMAL LOW (ref >60)
Glucose, Bld: 109 mg/dL — ABNORMAL HIGH (ref 70–99)
Potassium: 3.9 mmol/L (ref 3.5–5.1)
Sodium: 137 mmol/L (ref 135–145)

## 2018-12-24 LAB — CBC
HCT: 39 % (ref 36.0–46.0)
Hemoglobin: 13 g/dL (ref 12.0–15.0)
MCH: 28.6 pg (ref 26.0–34.0)
MCHC: 33.3 g/dL (ref 30.0–36.0)
MCV: 85.7 fL (ref 80.0–100.0)
Platelets: 321 10*3/uL (ref 150–400)
RBC: 4.55 MIL/uL (ref 3.87–5.11)
RDW: 11.9 % (ref 11.5–15.5)
WBC: 14.7 10*3/uL — ABNORMAL HIGH (ref 4.0–10.5)
nRBC: 0 % (ref 0.0–0.2)

## 2018-12-24 IMAGING — MR MR HEAD WO/W CM
14 of 16 series · 40 of 48 positions shown · IV contrast (gadavist)
Comparison: [DATE]

CLINICAL DATA: Postop day 1 status post resection of left
parieto-occipital tumor.

EXAM:
MRI HEAD WITHOUT AND WITH CONTRAST
TECHNIQUE: Multiplanar, multiecho pulse sequences of the brain and surrounding
structures were obtained without and with intravenous contrast.
CONTRAST:  5mL GADAVIST GADOBUTROL 1 MMOL/ML IV SOLN

[Series 5: DWI · axial · 3.0mm · 0.88mm/px · z∈[-144,-3]mm · 6 of 104 slices shown (1 of 4)]
[im 1/104]
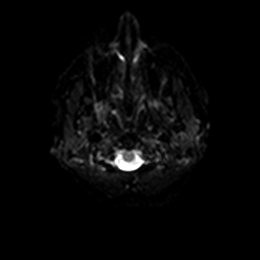
[im 21/104]
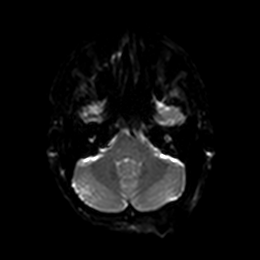
[im 42/104]
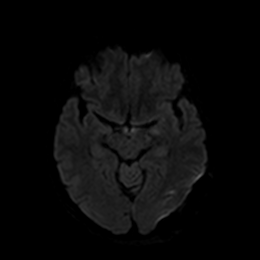
[im 62/104]
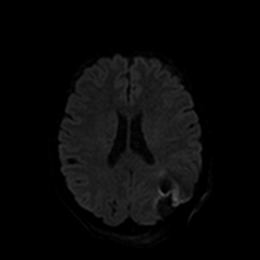
[im 83/104]
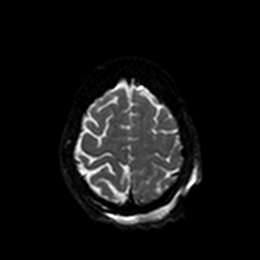
[im 104/104]
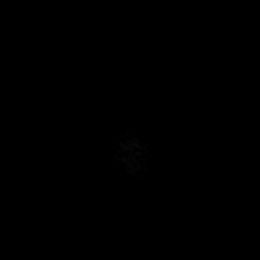

[Series 6: DWI · axial · 3.0mm · 0.88mm/px · z∈[-144,-3]mm · 2 of 51 slices shown (2 of 4)]
[im 1/51]
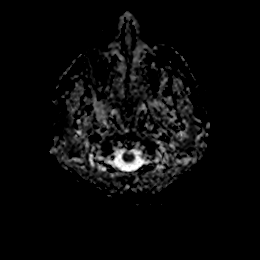
[im 51/51]
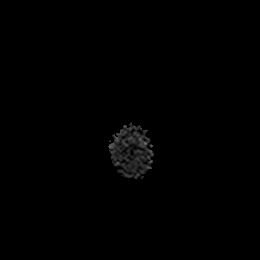

[Series 7: DWI · coronal · 4.0mm · 0.88mm/px · 4 of 72 slices shown (3 of 4)]
[im 1/72]
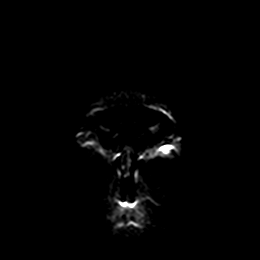
[im 24/72]
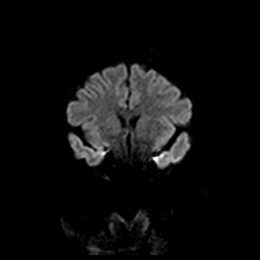
[im 48/72]
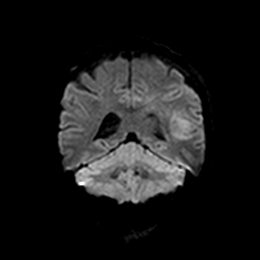
[im 72/72]
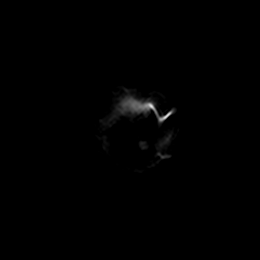

[Series 8: DWI · coronal · 4.0mm · 0.88mm/px · 2 of 36 slices shown (4 of 4)]
[im 1/36]
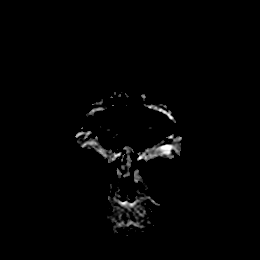
[im 36/36]
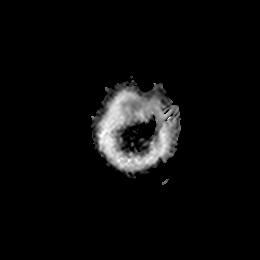

[Series 9: FLAIR · axial · 5.0mm · 0.45mm/px · z∈[-137,-4]mm · 2 of 25 slices shown]
[im 1/25]
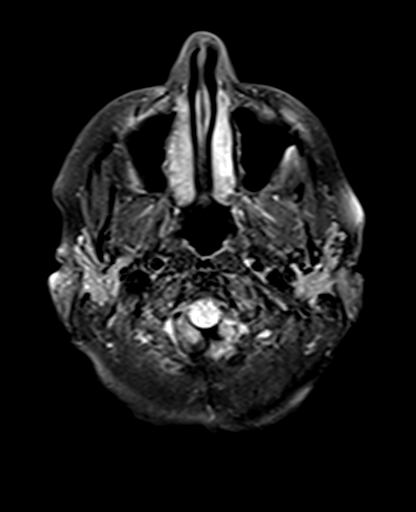
[im 25/25]
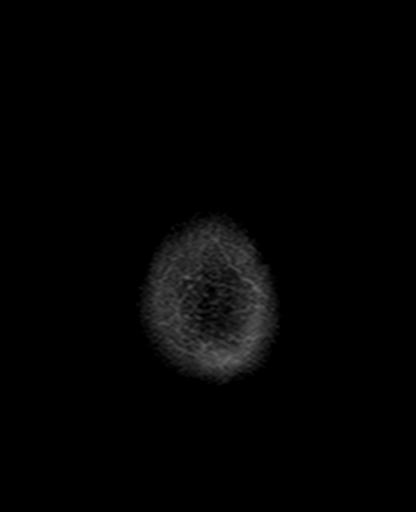

[Series 10: mag_images · axial · 3.0mm · 0.90mm/px · z∈[-147,+5]mm · 4 of 56 slices shown]
[im 1/56]
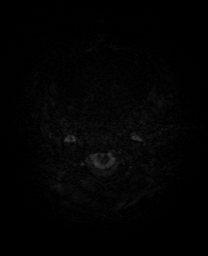
[im 19/56]
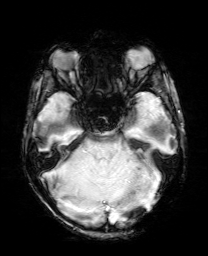
[im 37/56]
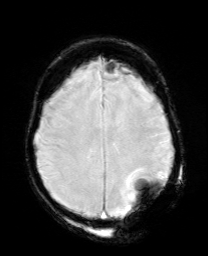
[im 56/56]
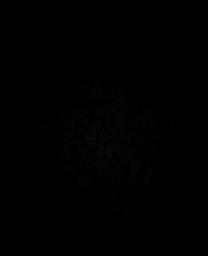

[Series 11: pha_images · axial · 3.0mm · 0.90mm/px · z∈[-147,-6]mm · 3 of 52 slices shown]
[im 1/52]
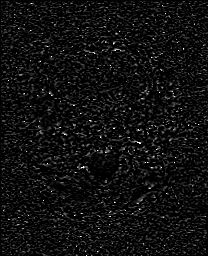
[im 26/52]
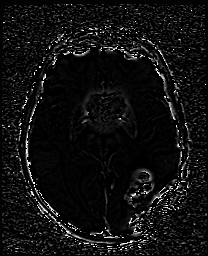
[im 52/52]
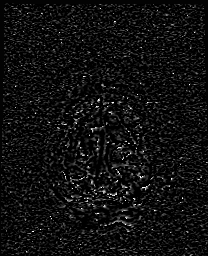

[Series 12: swi_images · axial · 3.0mm · 0.90mm/px · z∈[-147,+5]mm · 4 of 56 slices shown]
[im 1/56]
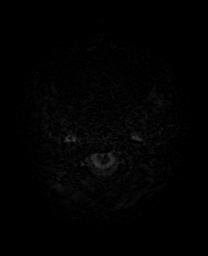
[im 19/56]
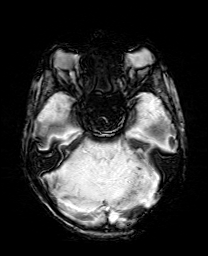
[im 37/56]
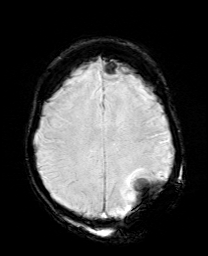
[im 56/56]
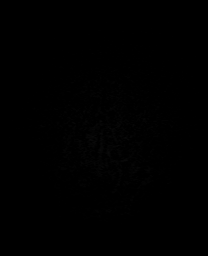

[Series 13: mip_images(sw) · axial · 24.0mm · 0.90mm/px · z∈[-137,-4]mm · 3 of 49 slices shown]
[im 1/49]
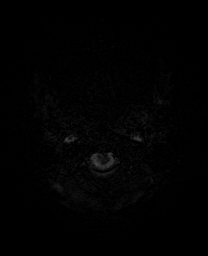
[im 25/49]
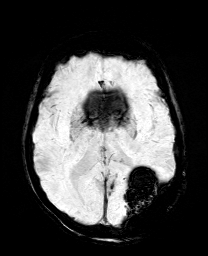
[im 49/49]
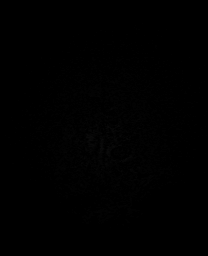

[Series 14: T1 · sagittal · 5.0mm · 0.75mm/px · 2 of 25 slices shown]
[im 1/25]
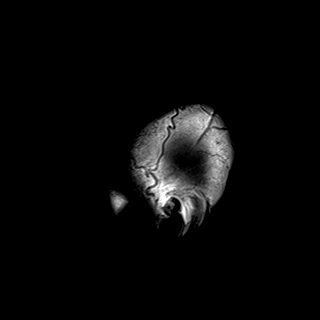
[im 25/25]
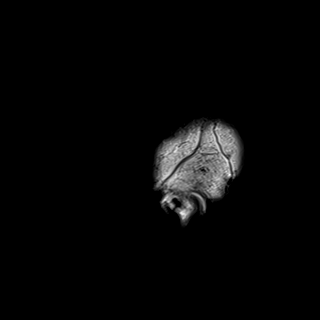

[Series 15: T2 · axial · 5.0mm · 0.72mm/px · z∈[-140,-7]mm · 2 of 25 slices shown]
[im 1/25]
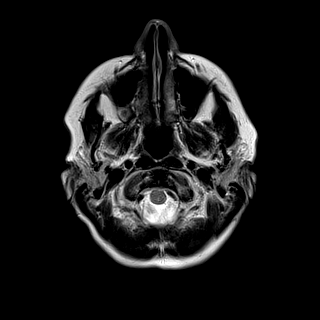
[im 25/25]
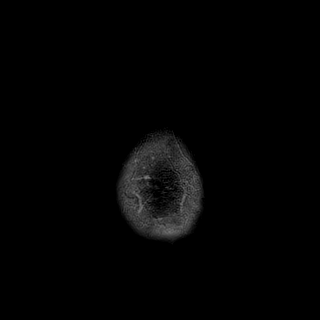

[Series 17: T2 post-contrast · coronal · 5.0mm · 0.72mm/px · 2 of 30 slices shown]
[im 1/30]
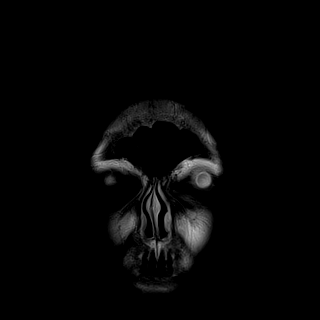
[im 30/30]
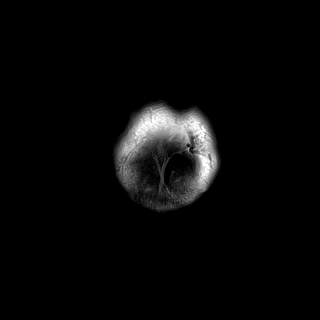

[Series 19: T1 post-contrast · coronal · 5.0mm · 0.34mm/px · 2 of 30 slices shown (1 of 2)]
[im 1/30]
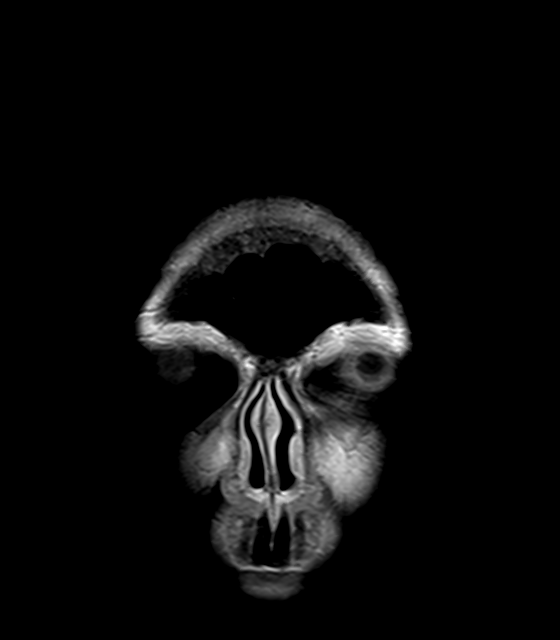
[im 30/30]
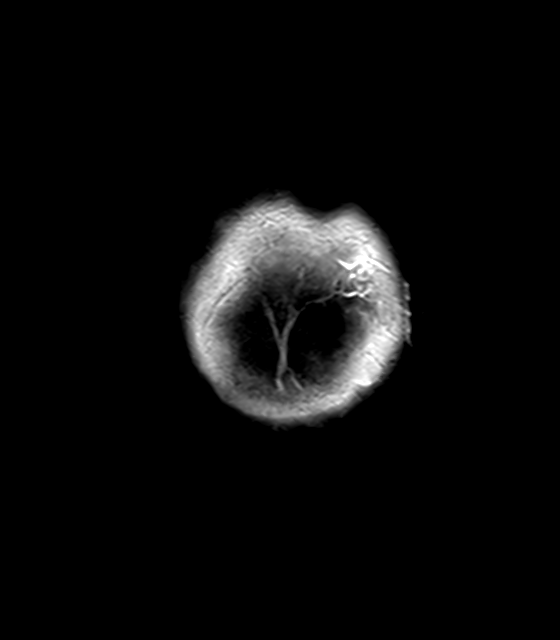

[Series 20: T1 post-contrast · sagittal · 5.0mm · 0.72mm/px · 2 of 25 slices shown (2 of 2)]
[im 1/25]
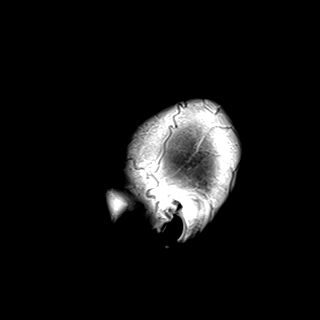
[im 25/25]
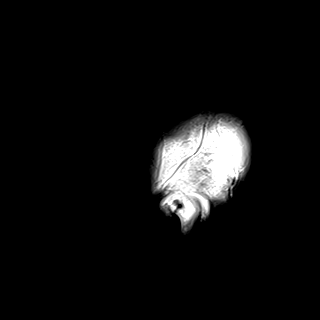

[40 of 48 positions shown; findings below may reference images not displayed]

FINDINGS: Brain: Sequelae of interval craniotomy and left parieto-occipital
tumor resection are identified. There is a 4 cm resection cavity
containing blood products and gas, and scattered small volume
pneumocephalus is also noted elsewhere. There is trace subdural
fluid and postoperative dural enhancement over the left cerebral
convexity. Minimal diffusion weighted signal abnormality is noted
along the margins of the resection cavity without evidence of a
sizable perioperative infarct. There is mild curvilinear enhancement
along the medial and superior aspects of the resection cavity
without masslike enhancement. There is persistent mild left
parieto-occipital vasogenic edema with mild mass effect on the left
lateral ventricle. There is no ventriculomegaly or midline shift.
The brain is normal in signal elsewhere.

Vascular: Major intracranial vascular flow voids are preserved.

Skull and upper cervical spine: Left parieto-occipital craniotomy.
No suspicious marrow lesion.

Sinuses/Orbits: Unremarkable orbits. Paranasal sinuses and mastoid
air cells are clear.

Other: None.
IMPRESSION: 1. Postoperative changes from interval left parieto-occipital tumor
resection. Minor enhancement along the margins of the resection
cavity without residual masslike enhancement.
2. Mild persistent left parieto-occipital edema.

## 2018-12-24 MED ORDER — GADOBUTROL 1 MMOL/ML IV SOLN
5.0000 mL | Freq: Once | INTRAVENOUS | Status: AC | PRN
  Administered 2018-12-24: 5 mL via INTRAVENOUS

## 2018-12-24 MED ORDER — CHLORHEXIDINE GLUCONATE CLOTH 2 % EX PADS
6.0000 | MEDICATED_PAD | Freq: Every day | CUTANEOUS | Status: DC
  Administered 2018-12-24 – 2018-12-27 (×4): 6 via TOPICAL

## 2018-12-24 NOTE — Evaluation (Signed)
Physical Therapy Evaluation Patient Details Name: Jillian Gomez MRN: [id number] DOB: 05-08-1955 Today's Date: 12/24/2018   History of Present Illness  Pt is a 63 yo female s/p L parietal occipital craniotomy for resection of tumor. PMHx: joint pain.  Clinical Impression  Pt admitted with above. 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 On PT evaluation, pt presents with decreased functional mobility secondary to right temporal visual field deficit and pain. Pt ambulating block around hallway unit without an assistive device without physical difficulty. Able to find signage/room numbers on the right with increased time. Education provided re: compensatory techniques for visual deficit, activity/work recommendations to avoid mental and visual fatigue. Don't anticipate need for PT follow up; will trial steps next session.     Follow Up Recommendations No PT follow up    Equipment Recommendations  None recommended by PT    Recommendations for Other Services       Precautions / Restrictions Precautions Precautions: Fall;Other (comment) Precaution Comments: A-line Restrictions Weight Bearing Restrictions: No      Mobility  Bed Mobility Overal bed mobility: Needs Assistance Bed Mobility: Rolling;Supine to Sit Rolling: Supervision   Supine to sit: Supervision     General bed mobility comments: SupervisionA for safety with IV lines  Transfers Overall transfer level: Needs assistance Equipment used: None Transfers: Sit to/from Stand Sit to Stand: Supervision         General transfer comment: assist for lines  Ambulation/Gait Ambulation/Gait assistance: Supervision Gait Distance (Feet): 400 Feet Assistive device: None Gait Pattern/deviations: WFL(Within Functional Limits)     General Gait Details: Supervision for line management; no apparent gait deficits or gross imbalance  Stairs            Wheelchair Mobility     Modified Rankin (Stroke Patients Only)       Balance Overall balance assessment: Mild deficits observed, not formally tested                                           Pertinent Vitals/Pain Pain Assessment: 0-10 Pain Score: 6  Pain Location: headache Pain Descriptors / Indicators: Headache Pain Intervention(s): Monitored during session    Home Living Family/patient expects to be discharged to:: Private residence Living Arrangements: Alone Available Help at Discharge: Family;Available PRN/intermittently Type of Home: House Home Access: Stairs to enter   [employer] of Steps: 2 Home Layout: Two level Home Equipment: None      Prior Function Level of Independence: Independent         Comments: Working at [employer]; independent with ADL and mobility; recent visual field blurriness     Hand Dominance   Dominant Hand: Right    Extremity/Trunk Assessment   Upper Extremity Assessment Upper Extremity Assessment: Overall WFL for tasks assessed    Lower Extremity Assessment Lower Extremity Assessment: RLE deficits/detail;LLE deficits/detail RLE Deficits / Details: Strength 5/5 LLE Deficits / Details: Strength 5/5    Cervical / Trunk Assessment Cervical / Trunk Assessment: Normal  Communication   Communication: No difficulties  Cognition Arousal/Alertness: Awake/alert Behavior During Therapy: WFL for tasks assessed/performed Overall Cognitive Status: Impaired/Different from baseline Area of Impairment: Memory;Problem solving                     Memory: Decreased short-term memory  Problem Solving: Slow processing;Requires verbal cues General Comments: Pt asking while ambulating to immediately recall 3 objects (ball table chair) and after 2 mins; but pt unable to recall 5 mins after ambulatory task.      General Comments General comments (skin integrity, edema, etc.): VSS, BP 129/73, O2 100% on RA,  HR 102-129 BPM throughout exertion.    Exercises     Assessment/Plan    PT Assessment Patient needs continued PT services  PT Problem List Pain;Decreased balance       PT Treatment Interventions Gait training;Stair training;Functional mobility training;Therapeutic activities;Therapeutic exercise;Balance training;Patient/family education    PT Goals (Current goals can be found in the Care Plan section)  Acute Rehab PT Goals Patient Stated Goal: to go home soon PT Goal Formulation: With patient Time For Goal Achievement: 01/07/19 Potential to Achieve Goals: Good    Frequency Min 3X/week   Barriers to discharge        Co-evaluation PT/OT/SLP Co-Evaluation/Treatment: Yes Reason for Co-Treatment: Complexity of the patient's impairments (multi-system involvement) PT goals addressed during session: Mobility/safety with mobility OT goals addressed during session: ADL's and self-care       AM-PAC PT "6 Clicks" Mobility  Outcome Measure Help needed turning from your back to your side while in a flat bed without using bedrails?: None Help needed moving from lying on your back to sitting on the side of a flat bed without using bedrails?: None Help needed moving to and from a bed to a chair (including a wheelchair)?: None Help needed standing up from a chair using your arms (e.g., wheelchair or bedside chair)?: None Help needed to walk in hospital room?: None Help needed climbing 3-5 steps with a railing? : A Little 6 Click Score: 23    End of Session Equipment Utilized During Treatment: Gait belt Activity Tolerance: Patient tolerated treatment well Patient left: in chair;with call bell/phone within reach;with chair alarm set Nurse Communication: Mobility status PT Visit Diagnosis: Pain Pain - part of body: (head)    Time: [id number] PT Time Calculation (min) (ACUTE ONLY): 31 min   Charges:   PT Evaluation $PT Eval Moderate Complexity: [place],  PT, DPT Acute Rehabilitation Services Pager [phone] Office [phone]   Willy Eddy 12/24/2018, 11:43 AM

## 2018-12-24 NOTE — Evaluation (Addendum)
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 PMHx: joint pain.   Clinical Impression   Pt PTA: Pt living alone with supportive family nearby. Pt was independent and still working. Pt currently performing own ADL tasks with supervisionA for lines. Pt performing own toilet hygiene, grooming at sink and fixing LB dressing. Pt reports headache. VSS, BP 129/73, O2 100% on RA, HR 102-129 BPM throughout exertion. R peripheral vision deficits. Pt supervisionA advised for medication management.  Pt with some cognitive involvement and appears very mild for memory and processing. Pt requiring increased time to fine room numbers on wall based on need for reading glasses or slower processing and pt unable to recall 3 items after 5 mins; pt okay with immediate and after 2 mins of recall. Pt would benefit from continued OT skilled services for memory. OT following acutely.     Follow Up Recommendations  No OT follow up;Supervision - Intermittent(supervisionA advised for medication management)    Equipment Recommendations  None recommended by OT    Recommendations for Other Services       Precautions / Restrictions Precautions Precautions: Fall;Other (comment) Precaution Comments: A-line Restrictions Weight Bearing Restrictions: No      Mobility Bed Mobility Overal bed mobility: Needs Assistance Bed Mobility: Rolling;Supine to Sit Rolling: Supervision   Supine to sit: Supervision     General bed mobility comments: SupervisionA for safety with IV lines  Transfers Overall transfer level: Needs assistance Equipment used: None Transfers: Sit to/from Stand Sit to Stand: Supervision         General transfer comment: assist for lines    Balance Overall balance assessment: Mild deficits observed, not  formally tested                                         ADL either performed or assessed with clinical judgement   ADL Overall ADL's : At baseline                                       General ADL Comments: Pt performing own ADL tasks with supervisionA for lines. Pt performing own toilet hygiene, grooming at sink and fixing LB dressing. Pt reports headache.     Vision Baseline Vision/History: Wears glasses Wears Glasses: At all times Vision Assessment?: Yes Eye Alignment: Within Functional Limits Ocular Range of Motion: Within Functional Limits Alignment/Gaze Preference: Within Defined Limits Convergence: Within functional limits Visual Fields: Right visual field deficit(peripheral deficit)     Perception     Praxis      Pertinent Vitals/Pain Pain Assessment: 0-10 Pain Score: 6  Pain Location: headache Pain Descriptors / Indicators: Headache Pain Intervention(s): Monitored during session     Hand Dominance Right   Extremity/Trunk Assessment Upper Extremity Assessment Upper Extremity Assessment: Overall WFL for tasks assessed   Lower Extremity Assessment Lower Extremity Assessment: Overall WFL for tasks assessed   Cervical / Trunk Assessment Cervical / Trunk Assessment: Normal   Communication Communication Communication: No difficulties   Cognition Arousal/Alertness: Awake/alert Behavior During Therapy: WFL for tasks assessed/performed Overall Cognitive Status: Impaired/Different from baseline Area of Impairment: Memory;Problem solving  Memory: Decreased short-term memory       Problem Solving: Slow processing;Requires verbal cues General Comments: Pt asking while ambulating to immediately recall 3 objects (ball table chair) and after 2 mins; but pt unable to recall 5 mins after ambulatory task.   General Comments  VSS, BP 129/73, O2 100% on RA, HR 102-129 BPM throughout exertion.    Exercises      Shoulder Instructions      Home Living Family/patient expects to be discharged to:: Private residence Living Arrangements: Alone Available Help at Discharge: Family;Available PRN/intermittently Type of Home: House Home Access: Stairs to enter Entrance Stairs-Number of Steps: 2   Home Layout: Two level Alternate Level Stairs-Number of Steps: flight   Bathroom Shower/Tub: Walk-in shower;Tub only   Bathroom Toilet: Handicapped height     Home Equipment: None          Prior Functioning/Environment Level of Independence: Independent        Comments: Working at [employer]; independent with ADL and mobility; recent visual field blurriness        OT Problem List: Decreased activity tolerance      OT Treatment/Interventions: Self-care/ADL training;Cognitive remediation/compensation;Energy conservation    OT Goals(Current goals can be found in the care plan section) Acute Rehab OT Goals Patient Stated Goal: to go home soon OT Goal Formulation: With patient Time For Goal Achievement: 01/07/19 Potential to Achieve Goals: Good ADL Goals Additional ADL Goal #1: Pt will perform higher level cognitive task specific to memory with 100% accuracy in 2/2 trials.  OT Frequency: Min 1X/week   Barriers to D/C:            Co-evaluation PT/OT/SLP Co-Evaluation/Treatment: Yes Reason for Co-Treatment: Complexity of the patient's impairments (multi-system involvement)   OT goals addressed during session: ADL's and self-care      AM-PAC OT "6 Clicks" Daily Activity     Outcome Measure Help from another person eating meals?: None Help from another person taking care of personal grooming?: None Help from another person toileting, which includes using toliet, bedpan, or urinal?: None Help from another person bathing (including washing, rinsing, drying)?: A Little Help from another person to put on and taking off regular upper body clothing?: None Help from  another person to put on and taking off regular lower body clothing?: A Little 6 Click Score: 22   End of Session Equipment Utilized During Treatment: Gait belt Nurse Communication: Mobility status  Activity Tolerance: Patient tolerated treatment well Patient left: in chair;with call bell/phone within reach;with chair alarm set  OT Visit Diagnosis: Unsteadiness on feet (R26.81);Pain Pain - part of body: (headache)                Time: [id number] OT Time Calculation (min): 34 min Charges:  OT General Charges $OT Visit: 1 Visit OT Evaluation $OT Eval Moderate Complexity: 1 Mod  Darryl Nestle) Marsa Aris OTR/L Acute Rehabilitation Services Pager: [phone] Office: [place] 12/24/2018, 10:55 AM

## 2018-12-24 NOTE — Progress Notes (Signed)
Pt arrived to 3W35 alert and oriented x4, vomiting on arrival. States pain level of 5/10 worsened by vomiting.

## 2018-12-24 NOTE — Progress Notes (Signed)
Postop day 1.  Patient overall doing well.  Minimal headache.  No new neurologic symptoms.  Still complains of right-sided visual field deficit.  Afebrile.  Vital signs are stable.  Awake and alert.  Oriented and appropriate.  Motor 5/5 bilaterally.  Sensory exam intact.  Cranial nerve function normal aside from a visual field deficit.  Wound clean and dry.  Progressing well following tumor resection.  Mobilize today.  Pathology pending.  MRI scan pending.

## 2018-12-25 LAB — BASIC METABOLIC PANEL
Anion gap: 9 (ref 5–15)
BUN: 11 mg/dL (ref 8–23)
CO2: 23 mmol/L (ref 22–32)
Calcium: 8.5 mg/dL — ABNORMAL LOW (ref 8.9–10.3)
Chloride: 104 mmol/L (ref 98–111)
Creatinine, Ser: 0.92 mg/dL (ref 0.44–1.00)
GFR calc Af Amer: >60 mL/min (ref >60)
GFR calc non Af Amer: >60 mL/min (ref >60)
Glucose, Bld: 142 mg/dL — ABNORMAL HIGH (ref 70–99)
Potassium: 3.9 mmol/L (ref 3.5–5.1)
Sodium: 136 mmol/L (ref 135–145)

## 2018-12-25 MED ORDER — SCOPOLAMINE 1 MG/3DAYS TD PT72
1.0000 | MEDICATED_PATCH | TRANSDERMAL | Status: DC
  Administered 2018-12-25: 1.5 mg via TRANSDERMAL
  Filled 2018-12-25: qty 1

## 2018-12-25 NOTE — Progress Notes (Signed)
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 PMHx: joint pain.   OT comments  Pt currently progressing. Pt able to perform Pillbox test with only 1 error, but does require increased time. Pt reports feeling "drunk" whenever OOB, reading her text messages, and when given a functional task to do. Pt requires increased time for delayed processing. Pt performing ADL with SupervisionA mostly in standing; pt requiring increased assist for mobility with minguardA.  Pt would benefit from continued OT skilled services for ADL, mobility and safety. OT following acutely.    Follow Up Recommendations  No OT follow up;Supervision - Intermittent(supervisionA for medication management)    Equipment Recommendations  None recommended by OT    Recommendations for Other Services      Precautions / Restrictions Precautions Precautions: Fall Restrictions Weight Bearing Restrictions: No       Mobility Bed Mobility Overal bed mobility: Needs Assistance Bed Mobility: Rolling;Supine to Sit;Sit to Supine Rolling: Supervision   Supine to sit: Supervision Sit to supine: Supervision   General bed mobility comments: SupervisionA for safety with IV lines  Transfers Overall transfer level: Needs assistance Equipment used: None Transfers: Sit to/from Stand Sit to Stand: Supervision         General transfer comment: assist for lines    Balance Overall balance assessment: Mild deficits observed, not formally tested                                         ADL either performed or assessed with clinical judgement   ADL Overall ADL's : At baseline                                       General ADL Comments: SupervisionA mostly in standing; pt requiring increased assist for  mobility with minguardA.     Vision   Vision Assessment?: Yes Visual Fields: Right visual field deficit(peripheral)   Perception     Praxis      Cognition Arousal/Alertness: Awake/alert Behavior During Therapy: WFL for tasks assessed/performed Overall Cognitive Status: Impaired/Different from baseline Area of Impairment: Attention;Problem solving;Awareness;Safety/judgement                   Current Attention Level: Sustained     Safety/Judgement: Decreased awareness of deficits   Problem Solving: Slow processing;Requires verbal cues General Comments: Pt able to perform Pillbox test with only 1 error, but does require increased time. Pt reports feeling "drunk" whenever OOB, reading her text messages, and when given a functional task to do. Pt requires increased time for delayed processing.         Exercises     Shoulder Instructions       General Comments VSS.    Pertinent Vitals/ Pain       Pain Assessment: 0-10 Pain Score: 3  Pain Location: headache Pain Descriptors / Indicators: Headache Pain Intervention(s): Monitored during session  Home Living  Prior Functioning/Environment              Frequency  Min 2X/week        Progress Toward Goals  OT Goals(current goals can now be found in the care plan section)  Progress towards OT goals: Progressing toward goals  Acute Rehab OT Goals Patient Stated Goal: to go home soon OT Goal Formulation: With patient Time For Goal Achievement: 01/07/19 Potential to Achieve Goals: Good ADL Goals Additional ADL Goal #1: Pt will perform higher level cognitive task specific to memory with 100% accuracy in 2/2 trials.  Plan Discharge plan remains appropriate;Frequency needs to be updated    Co-evaluation                 AM-PAC OT "6 Clicks" Daily Activity     Outcome Measure   Help from another person eating meals?: None Help from  another person taking care of personal grooming?: None Help from another person toileting, which includes using toliet, bedpan, or urinal?: None Help from another person bathing (including washing, rinsing, drying)?: A Little Help from another person to put on and taking off regular upper body clothing?: None Help from another person to put on and taking off regular lower body clothing?: A Little 6 Click Score: 22    End of Session Equipment Utilized During Treatment: Gait belt  OT Visit Diagnosis: Unsteadiness on feet (R26.81);Pain Pain - part of body: (headache)   Activity Tolerance Patient tolerated treatment well   Patient Left in bed;with call bell/phone within reach;with bed alarm set   Nurse Communication Mobility status        Time: [id number] OT Time Calculation (min): 25 min  Charges: OT General Charges $OT Visit: 1 Visit OT Treatments $Self Care/Home Management : 8-22 mins $Therapeutic Activity: 8-22 mins  Darryl Nestle) Marsa Aris OTR/L Acute Rehabilitation Services Pager: [phone] Office: [phone]    Audie Pinto 12/25/2018, 3:42 PM

## 2018-12-25 NOTE — Progress Notes (Signed)
  NEUROSURGERY PROGRESS NOTE   Continues to have PONV. Unable to advance diet from clear. Complains of appropriate headache No changes neuologically  EXAM:  BP 127/65 (BP Location: Right Arm)   Pulse 76   Temp 98.8 F (37.1 C) (Oral)   Resp 16   Ht 5\' 2"  (1.575 m)   Wt 53.6 kg   SpO2 99%   BMI 21.61 kg/m   Awake, alert, oriented  Speech fluent, appropriate  CN grossly intact, exception of vision MAEW, stable motor. No drift Incision: dried blood on bandage. No active drainage  IMPRESSION/PLAN 63 y.o. female POD #2 craniotomy for tumor resection. PONV limiting advancement, - PONV: BMP, scopolamine patch, increase fluids - Continue supportive care

## 2018-12-25 NOTE — Progress Notes (Signed)
Pt with one episode of nausea with vomiting at 0800. Pt given IV zofran which she states helped relieved her nausea. Tolerated some jello and juice for breakfast, and had no emesis. Scopolamine patch applied behind right ear. Pt requested saltine crackers at lunch, and given pack crackers. Pt tolerated well, and has had no further episodes of nausea or vomiting. Will continue to monitor

## 2018-12-26 ENCOUNTER — Encounter: Payer: Self-pay | Admitting: *Deleted

## 2018-12-26 MED ORDER — LEVETIRACETAM 500 MG PO TABS
500.0000 mg | ORAL_TABLET | Freq: Two times a day (BID) | ORAL | Status: DC
  Administered 2018-12-26 – 2018-12-27 (×3): 500 mg via ORAL
  Filled 2018-12-26 (×3): qty 1

## 2018-12-26 MED ORDER — PANTOPRAZOLE SODIUM 40 MG PO PACK
40.0000 mg | PACK | Freq: Every day | ORAL | Status: DC
  Administered 2018-12-26: 21:00:00 40 mg
  Filled 2018-12-26: qty 20

## 2018-12-26 NOTE — Progress Notes (Addendum)
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 PMHx: joint pain.   OT comments  Pt progressing really well today. Pt performimg grocery list with monetary values and given money for exact change. pt able to talk through it and accomplishing task with 90% accuracy in 1 trial and 100% accuracy in trail 2. Pt increasing ability to problem solve. R peripheral vision appears to be increasing with function, pt has not bumped into anything or c/o not being able to see out of R eye periphery. 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 OT following acutely. Messaged MD for SLP acute cog eval.   Follow Up Recommendations  No OT follow up;Supervision - Intermittent(Could benefit from OP SLP for job requirement cog eval)    Equipment Recommendations  None recommended by OT    Recommendations for Other Services      Precautions / Restrictions Precautions Precautions: Fall Precaution Comments: mod fall Restrictions Weight Bearing Restrictions: No       Mobility Bed Mobility Overal bed mobility: Modified Independent Bed Mobility: Supine to Sit Rolling: Supervision   Supine to sit: Supervision Sit to supine: Supervision      Transfers Overall transfer level: Modified independent Equipment used: None Transfers: Sit to/from Stand Sit to Stand: Supervision              Balance Overall balance assessment: No apparent balance deficits (not formally assessed)                                         ADL either performed or assessed with clinical judgement   ADL Overall ADL's : At baseline                                       General ADL Comments: SupervisionA mostly in standing; pt requiring  increased assist for mobility with minguardA.     Vision   Vision Assessment?: Yes Additional Comments: R peripheral vision appears to be increasing with function, pt has not bumped into anything or c/o not being able to see out of R eye periphery.   Perception     Praxis      Cognition Arousal/Alertness: Awake/alert Behavior During Therapy: WFL for tasks assessed/performed Overall Cognitive Status: Impaired/Different from baseline Area of Impairment: Problem solving                             Problem Solving: Slow processing;Requires verbal cues General Comments: Pt performimg grocery list with monetary values and given money for exact change. pt able to talk through it and accompligh task with 90% accuracy in 1 trial and 100% accuracy in trail 2. Pt increasing ability to problem solve.        Exercises     Shoulder Instructions       General Comments      Pertinent Vitals/ Pain       Pain Assessment: 0-10 Pain Score: 2  Faces Pain Scale: Hurts a little bit Pain Location: headache Pain Descriptors / Indicators: Headache Pain Intervention(s):  Limited activity within patient's tolerance  Home Living                                          Prior Functioning/Environment              Frequency  Min 2X/week        Progress Toward Goals  OT Goals(current goals can now be found in the care plan section)  Progress towards OT goals: Progressing toward goals  Acute Rehab OT Goals Patient Stated Goal: to go home soon OT Goal Formulation: With patient Time For Goal Achievement: 01/07/19 Potential to Achieve Goals: Good ADL Goals Additional ADL Goal #1: Pt will perform higher level cognitive task specific to memory with 100% accuracy in 2/2 trials.  Plan Discharge plan remains appropriate;Frequency needs to be updated    Co-evaluation                 AM-PAC OT "6 Clicks" Daily Activity     Outcome Measure   Help  from another person eating meals?: None Help from another person taking care of personal grooming?: None Help from another person toileting, which includes using toliet, bedpan, or urinal?: None Help from another person bathing (including washing, rinsing, drying)?: A Little Help from another person to put on and taking off regular upper body clothing?: None Help from another person to put on and taking off regular lower body clothing?: None 6 Click Score: 23    End of Session Equipment Utilized During Treatment: Gait belt  OT Visit Diagnosis: Unsteadiness on feet (R26.81);Pain   Activity Tolerance Patient tolerated treatment well   Patient Left in bed;with call bell/phone within reach;with bed alarm set   Nurse Communication Mobility status        Time: [id number] OT Time Calculation (min): 39 min  Charges: OT General Charges $OT Visit: 1 Visit OT Treatments $Self Care/Home Management : 23-37 mins $Therapeutic Activity: 8-22 mins  Darryl Nestle) Marsa Aris OTR/L Acute Rehabilitation Services Pager: [phone] Office: [phone]    Jillian Gomez 12/26/2018, 4:50 PM

## 2018-12-26 NOTE — Progress Notes (Signed)
  NEUROSURGERY PROGRESS NOTE   Sister, Jillian Gomez, updated.  Total time spent: 8 minutes

## 2018-12-26 NOTE — Progress Notes (Signed)
Physical Therapy Treatment Patient Details Name: Jillian Gomez MRN: [id number] DOB: June 11, 1955 Today's Date: 12/26/2018    History of Present Illness Pt is a 63 yo female s/p L parietal occipital craniotomy for resection of tumor. PMHx: joint pain.    PT Comments    Patient received in bed, agrees to PT session. Reports mild throbbing in head. Patient requires supervision only for bed mobility, transfers and ambulation. No LOB, reports feeling slightly weak due to not being as active as she is used to.  Ambulated 400 feet and up 2 steps without assistive device. Decreased stride length. Patient has met goals at this time and is safe to ambulate in hallway as desired while here. PT to sign off at this time.       Follow Up Recommendations  No PT follow up     Equipment Recommendations  None recommended by PT    Recommendations for Other Services       Precautions / Restrictions Precautions Precaution Comments: mod fall Restrictions Weight Bearing Restrictions: No    Mobility  Bed Mobility Overal bed mobility: Modified Independent Bed Mobility: Supine to Sit Rolling: Supervision   Supine to sit: Supervision Sit to supine: Supervision      Transfers Overall transfer level: Modified independent Equipment used: None Transfers: Sit to/from Stand Sit to Stand: Supervision            Ambulation/Gait Ambulation/Gait assistance: Supervision Gait Distance (Feet): 400 Feet Assistive device: None Gait Pattern/deviations: WFL(Within Functional Limits);Narrow base of support;Decreased stride length         Stairs Stairs: Yes Stairs assistance: Supervision Stair Management: Two rails;Alternating pattern Number of Stairs: 2     Wheelchair Mobility    Modified Rankin (Stroke Patients Only)       Balance Overall balance assessment: No apparent balance deficits (not formally assessed)                                          Cognition  Arousal/Alertness: Awake/alert Behavior During Therapy: WFL for tasks assessed/performed Overall Cognitive Status: Within Functional Limits for tasks assessed                                        Exercises      General Comments        Pertinent Vitals/Pain Pain Assessment: Faces Faces Pain Scale: Hurts a little bit Pain Location: headache Pain Descriptors / Indicators: Throbbing Pain Intervention(s): Monitored during session    Home Living                      Prior Function            PT Goals (current goals can now be found in the care plan section) Acute Rehab PT Goals Patient Stated Goal: to go home soon PT Goal Formulation: With patient Time For Goal Achievement: 01/07/19 Potential to Achieve Goals: Good Progress towards PT goals: Goals met/education completed, patient discharged from PT    Frequency    Min 3X/week      PT Plan Current plan remains appropriate    Co-evaluation              AM-PAC PT "6 Clicks" Mobility   Outcome Measure  Help needed turning from your back to  your side while in a flat bed without using bedrails?: None Help needed moving from lying on your back to sitting on the side of a flat bed without using bedrails?: None Help needed moving to and from a bed to a chair (including a wheelchair)?: None Help needed standing up from a chair using your arms (e.g., wheelchair or bedside chair)?: None Help needed to walk in hospital room?: None Help needed climbing 3-5 steps with a railing? : None 6 Click Score: 24    End of Session Equipment Utilized During Treatment: Gait belt Activity Tolerance: Patient tolerated treatment well Patient left: in chair;with call bell/phone within reach Nurse Communication: Mobility status PT Visit Diagnosis: Muscle weakness (generalized) (M62.81);Pain Pain - part of body: (head)     Time: 0981-1914 PT Time Calculation (min) (ACUTE ONLY): 20 min  Charges:  $Gait  Training: 8-22 mins                     Enya Bureau, PT, GCS 12/26/18,2:12 PM

## 2018-12-26 NOTE — Progress Notes (Signed)
  NEUROSURGERY PROGRESS NOTE   No issues overnight.  N/V improved with scopolamine  Advancing diet  EXAM:  BP 113/63 (BP Location: Right Arm)   Pulse 71   Temp 98.4 F (36.9 C) (Oral)   Resp 18   Ht 5\' 2"  (1.575 m)   Wt 53.6 kg   SpO2 96%   BMI 21.61 kg/m   Awake, alert, oriented  Speech fluent, appropriate  CN grossly intact  5/5 BUE/BLE  Incision: c/d/i  IMPRESSION/PLAN 63 y.o. female POD #3 craniotomy for tumor resection. Doing better this am - advance diet to regular.  - anticipate discharge later this afternoon/tomorrow am if continues to do well

## 2018-12-26 NOTE — Anesthesia Postprocedure Evaluation (Signed)
Anesthesia Post Note  Patient: Jillian Gomez  Procedure(s) Performed: STEREOTACTIC LEFT OCCIPITAL CRANIOTOMY FOR RESECTION OF TUMOR (Left Head) APPLICATION OF CRANIAL NAVIGATION (Left )     Patient location during evaluation: PACU Anesthesia Type: General Level of consciousness: awake and alert Pain management: pain level controlled Vital Signs Assessment: post-procedure vital signs reviewed and stable Respiratory status: spontaneous breathing, nonlabored ventilation and respiratory function stable Cardiovascular status: blood pressure returned to baseline and stable Postop Assessment: no apparent nausea or vomiting Anesthetic complications: no    Last Vitals:  Vitals:   12/25/18 2337 12/26/18 0342  BP: 105/64 120/67  Pulse: 62 66  Resp: 18 18  Temp: 37.1 C 36.7 C  SpO2: 97% 98%    Last Pain:  Vitals:   12/26/18 0342  TempSrc: Oral  PainSc:                  Lynda Rainwater

## 2018-12-26 NOTE — TOC Initial Note (Signed)
Transition of Care [facility]) - Initial/Assessment Note    Patient Details  Name: Jillian Gomez MRN: [id number] Date of Birth: Sep 24, 1955  Transition of Care [facility]) CM/SW Contact:    Pollie Friar, RN Phone Number: 12/26/2018, 3:52 PM  Clinical Narrative:                 No f/u per PT/OT and no DME needs. Pt planning to return home alone when discharged. CM encouraged someone to stay the first 24 hours but she states her siblings can check in on her.  TOC following for further needs at discharge.   Expected Discharge Plan: Home/Self Care Barriers to Discharge: Continued Medical Work up   Patient Goals and CMS Choice        Expected Discharge Plan and Services Expected Discharge Plan: Home/Self Care   Discharge Planning Services: CM Consult                                          Prior Living Arrangements/Services   Lives with:: Self Patient language and need for interpreter reviewed:: Yes Do you feel safe going back to the place where you live?: Yes      Need for Family Participation in Patient Care: No (Comment) Care giver support system in place?: No (comment)   Criminal Activity/Legal Involvement Pertinent to Current Situation/Hospitalization: No - Comment as needed  Activities of Daily Living Home Assistive Devices/Equipment: Eyeglasses ADL Screening (condition at time of admission) Patient's cognitive ability adequate to safely complete daily activities?: Yes Is the patient deaf or have difficulty hearing?: No Does the patient have difficulty seeing, even when wearing glasses/contacts?: No Does the patient have difficulty concentrating, remembering, or making decisions?: No Patient able to express need for assistance with ADLs?: Yes Does the patient have difficulty dressing or bathing?: No Independently performs ADLs?: No Communication: Independent Dressing (OT): Needs assistance Is this a change from baseline?: Change from baseline, expected to last  <3days Grooming: Needs assistance Is this a change from baseline?: Change from baseline, expected to last <3 days Feeding: Independent Bathing: Needs assistance Is this a change from baseline?: Change from baseline, expected to last <3 days Toileting: Needs assistance Is this a change from baseline?: Change from baseline, expected to last <3 days In/Out Bed: Needs assistance Is this a change from baseline?: Change from baseline, expected to last <3 days Walks in Home: Needs assistance Is this a change from baseline?: Change from baseline, expected to last <3 days Does the patient have difficulty walking or climbing stairs?: No Weakness of Legs: None Weakness of Arms/Hands: None  Permission Sought/Granted                  Emotional Assessment Appearance:: Appears stated age Attitude/Demeanor/Rapport: Engaged Affect (typically observed): Accepting Orientation: : Oriented to Self, Oriented to Place, Oriented to  Time, Oriented to Situation   Psych Involvement: No (comment)  Admission diagnosis:  Brain tumor [facility]) [D49.6] Patient Active Problem List   Diagnosis Date Noted  . Brain tumor ([place]) 12/23/2018   PCP:  Nicoletta Dress, MD Pharmacy:   [facility] [place] [place] [address] Phone: [phone] Fax: [phone]     Social Determinants of Health (SDOH) Interventions    Readmission Risk Interventions No flowsheet data  found.  

## 2018-12-27 ENCOUNTER — Telehealth: Payer: Self-pay | Admitting: Internal Medicine

## 2018-12-27 ENCOUNTER — Other Ambulatory Visit: Payer: Self-pay | Admitting: Radiation Therapy

## 2018-12-27 ENCOUNTER — Telehealth: Payer: Self-pay | Admitting: *Deleted

## 2018-12-27 MED ORDER — LEVETIRACETAM 500 MG PO TABS: 500.0000 mg | ORAL_TABLET | Freq: Two times a day (BID) | ORAL | 2 refills | Status: DC

## 2018-12-27 MED ORDER — SCOPOLAMINE 1 MG/3DAYS TD PT72: 1.0000 | MEDICATED_PATCH | TRANSDERMAL | 12 refills | Status: DC

## 2018-12-27 MED ORDER — NAPROXEN SODIUM 220 MG PO TABS: 220.0000 mg | ORAL_TABLET | Freq: Every day | ORAL | Status: AC | PRN

## 2018-12-27 MED ORDER — ASPIRIN 81 MG PO CHEW: 81.0000 mg | CHEWABLE_TABLET | Freq: Every day | ORAL | Status: DC

## 2018-12-27 MED ORDER — OXYCODONE-ACETAMINOPHEN 7.5-325 MG PO TABS: 1.0000 | ORAL_TABLET | ORAL | 0 refills | Status: DC | PRN

## 2018-12-27 NOTE — Progress Notes (Signed)
Patient being discharged home, transportation provided by daughter.Educated patient on discharge instructions, questions answered. Belongings returned to patient.

## 2018-12-27 NOTE — Telephone Encounter (Signed)
Path report is back.  Per [place] ok to get in office sooner than 01/16/2019.

## 2018-12-27 NOTE — Discharge Summary (Signed)
Physician Discharge Summary  Patient ID: Jillian Gomez MRN: [id number] DOB/AGE: Jan 31, 1955 63 y.o.  Admit date: 12/23/2018 Discharge date: 12/27/2018  Admission Diagnoses:  Brain tumor  Discharge Diagnoses:  Same Active Problems:   Brain tumor [facility])  Discharged Condition: Stable  Hospital Course:  Jillian Gomez is a 63 y.o. female who was admitted for the below procedure. Post op complicated by PONV, much improved with scopolamine. Able to advance diet. At time of discharge, pain was well controlled, ambulating with Pt/OT, tolerating po, voiding normal. Ready for discharge.  Treatments: Surgery 1. Stereotactic left parietal occipital craniotomy for resection of tumor 2. Use of intraoperative microscope for microdissection  Discharge Exam: Blood pressure 101/74, pulse 71, temperature 98.7 F (37.1 C), temperature source Oral, resp. rate 16, height 5\' 2"  (1.575 m), weight 53.6 kg, SpO2 100 %. Awake, alert, oriented Speech fluent, appropriate CN grossly intact, stable visual deficits 5/5 BUE/BLE Wound c/d/i  Disposition: Discharge disposition: 01-Home or Self Care       Discharge Instructions    Call MD for:  difficulty breathing, headache or visual disturbances   Complete by: As directed    Call MD for:  persistant dizziness or light-headedness   Complete by: As directed    Call MD for:  redness, tenderness, or signs of infection (pain, swelling, redness, odor or green/yellow discharge around incision site)   Complete by: As directed    Call MD for:  severe uncontrolled pain   Complete by: As directed    Call MD for:  temperature >100.4   Complete by: As directed    Diet general   Complete by: As directed    Driving Restrictions   Complete by: As directed    Do not drive until given clearance.   Increase activity slowly   Complete by: As directed    May shower / Bathe   Complete by: As directed    In 24 hours. Okay to wash wound with warm soapy water.  Avoid scrubbing the wound. Pat dry.     Allergies as of 12/27/2018      Reactions   Etodolac Other (See Comments)   GI upset   Macrobid [nitrofurantoin] Rash      Medication List    TAKE these medications   aspirin 81 MG chewable tablet Chew 1 tablet (81 mg total) by mouth daily. Start taking on: December 30, 2018 What changed: These instructions start on December 30, 2018. If you are unsure what to do until then, ask your doctor or other care provider.   Biotin 1 MG Caps Take 1 mg by mouth daily.   CALCIUM PO Take 1 tablet by mouth daily.   Euthyrox 50 MCG tablet Generic drug: levothyroxine Take 50 mcg by mouth daily.   levETIRAcetam 500 MG tablet Commonly known as: KEPPRA Take 1 tablet (500 mg total) by mouth 2 (two) times daily.   naproxen sodium 220 MG tablet Commonly known as: ALEVE Take 1 tablet (220 mg total) by mouth daily as needed (pain). Start taking on: December 28, 2018 What changed: These instructions start on December 28, 2018. If you are unsure what to do until then, ask your doctor or other care provider.   oxyCODONE-acetaminophen 7.5-325 MG tablet Commonly known as: Percocet Take 1 tablet by mouth every 4 (four) hours as needed for severe pain.   scopolamine 1 MG/3DAYS Commonly known as: TRANSDERM-SCOP Place 1 patch (1.5 mg total) onto the skin every 3 (three) days. Start taking on: December 28, 2018      Follow-up Information    Lisbeth Renshaw, MD. Schedule an appointment as soon as possible for a visit in 2 week(s).   Specialty: Neurosurgery Contact information: [address] [place] [address] [phone]           Signed: Alyson Ingles 12/27/2018, 10:04 AM

## 2018-12-27 NOTE — TOC Transition Note (Signed)
Transition of Care [facility]) - CM/SW Discharge Note   Patient Details  Name: Jillian Gomez MRN: [id number] Date of Birth: [birth year]  Transition of Care [facility]) CM/SW Contact:  Pollie Friar, RN Phone Number: 12/27/2018, 10:54 AM   Clinical Narrative:    Pt discharging home with self care. Pt to f/u with [place] therapy for ST. Information on her AVS. Pt states again that her family will provide some support at home as needed and transportation. Family to transport home today.   Final next level of care: Home/Self Care Barriers to Discharge: No Barriers Identified   Patient Goals and CMS Choice        Discharge Placement                       Discharge Plan and Services   Discharge Planning Services: CM Consult                                 Social Determinants of Health (SDOH) Interventions     Readmission Risk Interventions No flowsheet data found.

## 2018-12-27 NOTE — Telephone Encounter (Signed)
Called to schedule new patient visit to be seen by Dr Mickeal Skinner  After 01/11/2021to allow enough time for pathology results to be returned.  Had to leave  Message, pending call back.

## 2018-12-27 NOTE — Telephone Encounter (Signed)
A new patient appt has been scheduled for Jillian Gomez to see Dr. Mickeal Skinner on 12/31 at 11:30am. I lft the appt date and time on the pt's vm.

## 2018-12-27 NOTE — Progress Notes (Signed)
  NEUROSURGERY PROGRESS NOTE   No issues overnight.  Nausea resolved. Minimal pain No concerns this am  EXAM:  BP 101/74 (BP Location: Right Arm)   Pulse 71   Temp 98.7 F (37.1 C) (Oral)   Resp 16   Ht 5\' 2"  (1.575 m)   Wt 53.6 kg   SpO2 100%   BMI 21.61 kg/m   Awake, alert, oriented  Speech fluent, appropriate  CN grossly intact, stable vision 5/5 BUE/BLE  Incision: dried blood on bandage  IMPRESSION/PLAN 63 y.o. female POD #4 craniotomy for tumor resection. Doing well - Discharge home - Rx sent in electronically

## 2019-01-03 ENCOUNTER — Telehealth: Payer: Self-pay | Admitting: Internal Medicine

## 2019-01-03 NOTE — Telephone Encounter (Signed)
Encounter created in error

## 2019-01-05 ENCOUNTER — Ambulatory Visit: Payer: BC Managed Care – PPO | Admitting: Internal Medicine

## 2019-01-09 ENCOUNTER — Inpatient Hospital Stay: Payer: BC Managed Care – PPO | Attending: Internal Medicine | Admitting: Internal Medicine

## 2019-01-09 ENCOUNTER — Encounter (HOSPITAL_COMMUNITY): Payer: Self-pay | Admitting: Neurosurgery

## 2019-01-09 ENCOUNTER — Other Ambulatory Visit: Payer: Self-pay

## 2019-01-09 VITALS — BP 139/82 | HR 72 | Temp 97.8°F | Resp 18 | Ht 62.0 in | Wt 114.3 lb

## 2019-01-09 DIAGNOSIS — Z79899 Other long term (current) drug therapy: Secondary | ICD-10-CM

## 2019-01-09 DIAGNOSIS — E7800 Pure hypercholesterolemia, unspecified: Secondary | ICD-10-CM | POA: Insufficient documentation

## 2019-01-09 DIAGNOSIS — C714 Malignant neoplasm of occipital lobe: Secondary | ICD-10-CM | POA: Diagnosis not present

## 2019-01-09 DIAGNOSIS — E039 Hypothyroidism, unspecified: Secondary | ICD-10-CM | POA: Diagnosis not present

## 2019-01-09 DIAGNOSIS — C719 Malignant neoplasm of brain, unspecified: Secondary | ICD-10-CM

## 2019-01-09 DIAGNOSIS — Z7982 Long term (current) use of aspirin: Secondary | ICD-10-CM | POA: Diagnosis not present

## 2019-01-09 NOTE — Progress Notes (Signed)
[place] at [address] [place], [place] [address] [phone]   New Patient Evaluation  Date of Service: 01/09/19 Patient Name: Jillian Gomez Patient MRN: [id number] Patient DOB: 14-May-1955 Provider: Ventura Sellers, MD  Identifying Statement:  Jillian Gomez is a 64 y.o. female with left occipital glioblastoma who presents for initial consultation and evaluation.    Referring Provider: Nicoletta Dress, MD [employer],  [place] [id number]  Oncologic History: Oncology History  Glioblastoma with isocitrate dehydrogenase gene wildtype [facility])  12/23/2018 Surgery   Craniotomy, left occipital resection by Dr. Kathyrn Sheriff.  Path is GBM IDH-wt     Biomarkers:  MGMT Unknown.  IDH 1/2 Wild type.  EGFR Unknown  TERT Unknown   History of Present Illness: The patient's records from the referring physician were obtained and reviewed and the patient interviewed to confirm this HPI.  Jillian Gomez presented to medical attention in early December with ~2 months history of right sided visual impairment.  She describes fuzzy or blurry vision on that side which was progressive over time, leading to at least one fall.  MRI brain demonstrated enhancing left occipital mass; this was subsequently resected by Dr. Kathyrn Sheriff on 12/23/18.  She had no issues with surgery and has completed her steroid taper.  Continues on keppra daily but no history of any seizure. She has returned to work with only modest difficutly.  No issues walking or performing ADLs; lives alone but her sister and other family members are nearby.  Medications: Current Outpatient Medications on File Prior to Visit  Medication Sig Dispense Refill  . aspirin 81 MG chewable tablet Chew 1 tablet (81 mg total) by mouth daily.    . Biotin 1 MG CAPS Take 1 mg by mouth daily.    Marland Kitchen CALCIUM PO Take 1 tablet by mouth daily.    Arna Medici 50 MCG tablet Take 50 mcg by mouth  daily.    Marland Kitchen levETIRAcetam (KEPPRA) 500 MG tablet Take 1 tablet (500 mg total) by mouth 2 (two) times daily. 60 tablet 2  . naproxen sodium (ALEVE) 220 MG tablet Take 1 tablet (220 mg total) by mouth daily as needed (pain).    Marland Kitchen oxyCODONE-acetaminophen (PERCOCET) 7.5-325 MG tablet Take 1 tablet by mouth every 4 (four) hours as needed for severe pain. 30 tablet 0  . scopolamine (TRANSDERM-SCOP) 1 MG/3DAYS Place 1 patch (1.5 mg total) onto the skin every 3 (three) days. 10 patch 12   No current facility-administered medications on file prior to visit.    Allergies:  Allergies  Allergen Reactions  . Etodolac Other (See Comments)    GI upset  . Macrobid [Nitrofurantoin] Rash   Past Medical History:  Past Medical History:  Diagnosis Date  . High cholesterol   . Hypothyroidism   . Joint pain    Past Surgical History:  Past Surgical History:  Procedure Laterality Date  . APPLICATION OF CRANIAL NAVIGATION Left 12/23/2018   Procedure: APPLICATION OF CRANIAL NAVIGATION;  Surgeon: Consuella Lose, MD;  Location: [place];  Service: Neurosurgery;  Laterality: Left;  APPLICATION OF CRANIAL NAVIGATION  . BREAST BIOPSY Left   . COLONOSCOPY    . CRANIOTOMY Left 12/23/2018   Procedure: STEREOTACTIC LEFT OCCIPITAL CRANIOTOMY FOR RESECTION OF TUMOR;  Surgeon: Consuella Lose, MD;  Location: [place];  Service: Neurosurgery;  Laterality: Left;  STEREOTACTIC LEFT OCCIPITAL CRANIOTOMY FOR RESECTION OF TUMOR  . LASIK  Social History:  Social History   Socioeconomic History  . Marital status: Single    Spouse name: Not on file  . Number of children: Not on file  . Years of education: Not on file  . Highest education level: Not on file  Occupational History  . Not on file  Tobacco Use  . Smoking status: Never Smoker  . Smokeless tobacco: Never Used  Substance and Sexual Activity  . Alcohol use: Not Currently  . Drug use: Never  . Sexual activity: Not on file  Other Topics Concern  . Not  on file  Social History Narrative  . Not on file   Social Determinants of Health   Financial Resource Strain:   . Difficulty of Paying Living Expenses: Not on file  Food Insecurity:   . Worried About Charity fundraiser in the Last Year: Not on file  . Ran Out of Food in the Last Year: Not on file  Transportation Needs:   . Lack of Transportation (Medical): Not on file  . Lack of Transportation (Non-Medical): Not on file  Physical Activity:   . Days of Exercise per Week: Not on file  . Minutes of Exercise per Session: Not on file  Stress:   . Feeling of Stress : Not on file  Social Connections:   . Frequency of Communication with Friends and Family: Not on file  . Frequency of Social Gatherings with Friends and Family: Not on file  . Attends Religious Services: Not on file  . Active Member of Clubs or Organizations: Not on file  . Attends Archivist Meetings: Not on file  . Marital Status: Not on file  Intimate Partner Violence:   . Fear of Current or Ex-Partner: Not on file  . Emotionally Abused: Not on file  . Physically Abused: Not on file  . Sexually Abused: Not on file   Family History: No family history on file.  Review of Systems: Constitutional: Denies fevers, chills or abnormal weight loss Eyes: Denies blurriness of vision Ears, nose, mouth, throat, and face: Denies mucositis or sore throat Respiratory: Denies cough, dyspnea or wheezes Cardiovascular: Denies palpitation, chest discomfort or lower extremity swelling Gastrointestinal:  Denies nausea, constipation, diarrhea GU: Denies dysuria or incontinence Skin: Denies abnormal skin rashes Neurological: Per HPI Musculoskeletal: Denies joint pain, back or neck discomfort. No decrease in ROM Behavioral/Psych: Denies anxiety, disturbance in thought content, and mood instability  Physical Exam: Vitals:   01/09/19 1046  BP: 139/82  Pulse: 72  Resp: 18  Temp: 97.8 F (36.6 C)  SpO2: 100%   KPS:  80. General: Alert, cooperative, pleasant, in no acute distress Head: Craniotomy scar noted, dry and intact. EENT: No conjunctival injection or scleral icterus. Oral mucosa moist Lungs: Resp effort normal Cardiac: Regular rate and rhythm Abdomen: Soft, non-distended abdomen Skin: No rashes cyanosis or petechiae. Extremities: No clubbing or edema  Neurologic Exam: Mental Status: Awake, alert, attentive to examiner. Oriented to self and environment. Language is fluent with intact comprehension.  Cranial Nerves: Visual acuity is grossly normal. Right homonymous hemianopia. Extra-ocular movements intact. No ptosis. Face is symmetric, tongue midline. Motor: Tone and bulk are normal. Power is full in both arms and legs. Reflexes are symmetric, no pathologic reflexes present. Intact finger to nose bilaterally Sensory: Intact to light touch and temperature Gait: Normal and tandem gait is deferred.   Labs: I have reviewed the data as listed    Component Value Date/Time   NA 136 12/25/2018  1029   K 3.9 12/25/2018 1029   CL 104 12/25/2018 1029   CO2 23 12/25/2018 1029   GLUCOSE 142 (H) 12/25/2018 1029   BUN 11 12/25/2018 1029   CREATININE 0.92 12/25/2018 1029   CALCIUM 8.5 (L) 12/25/2018 1029   GFRNONAA >60 12/25/2018 1029   GFRAA >60 12/25/2018 1029   Lab Results  Component Value Date   WBC 14.7 (H) 12/24/2018   HGB 13.0 12/24/2018   HCT 39.0 12/24/2018   MCV 85.7 12/24/2018   PLT 321 12/24/2018    Imaging:  MR BRAIN W WO CONTRAST  Result Date: 12/24/2018 CLINICAL DATA:  Postop day 1 status post resection of left parieto-occipital tumor. EXAM: MRI HEAD WITHOUT AND WITH CONTRAST TECHNIQUE: Multiplanar, multiecho pulse sequences of the brain and surrounding structures were obtained without and with intravenous contrast. CONTRAST:  71m GADAVIST GADOBUTROL 1 MMOL/ML IV SOLN COMPARISON:  12/09/2018 FINDINGS: Brain: Sequelae of interval craniotomy and left parieto-occipital tumor  resection are identified. There is a 4 cm resection cavity containing blood products and gas, and scattered small volume pneumocephalus is also noted elsewhere. There is trace subdural fluid and postoperative dural enhancement over the left cerebral convexity. Minimal diffusion weighted signal abnormality is noted along the margins of the resection cavity without evidence of a sizable perioperative infarct. There is mild curvilinear enhancement along the medial and superior aspects of the resection cavity without masslike enhancement. There is persistent mild left parieto-occipital vasogenic edema with mild mass effect on the left lateral ventricle. There is no ventriculomegaly or midline shift. The brain is normal in signal elsewhere. Vascular: Major intracranial vascular flow voids are preserved. Skull and upper cervical spine: Left parieto-occipital craniotomy. No suspicious marrow lesion. Sinuses/Orbits: Unremarkable orbits. Paranasal sinuses and mastoid air cells are clear. Other: None. IMPRESSION: 1. Postoperative changes from interval left parieto-occipital tumor resection. Minor enhancement along the margins of the resection cavity without residual masslike enhancement. 2. Mild persistent left parieto-occipital edema. Electronically Signed   By: ALogan BoresM.D.   On: 12/24/2018 17:21    Pathology: SURGICAL PATHOLOGY  * THIS IS AN ADDENDUM REPORT *  CASE: [id number]  PATIENT: [facility] Surgical Pathology Report  *Addendum *   Reason for Addendum #1: Molecular Add-on   Clinical History: Brain Tumor    FINAL MICROSCOPIC DIAGNOSIS:   A. BRAIN, LEFT PARIETO-OCCIPITAL TUMOR, RESECTION:  - Glioblastoma multiforme, WHO Grade IV  - See comment   COMMENT:   Dr. PSaralyn Pilarreviewed the case and agrees with the above diagnosis. Dr.  NSaddie Benderswas notified of these results on December 27, 2018. IDH  testing will be performed and reported separately.    GROSS DESCRIPTION:    Received fresh is a 4.1 x 2.6 x 1.8 cm soft, friable tissue with  gray-white to dark red cut surfaces. Representative sections in 3  blocks.   SW 12/24/2018   Assessment/Plan Glioblastoma with isocitrate dehydrogenase gene wildtype ([place] [C71.9]  We appreciate the opportunity to participate in the care of Jillian Gomez  She presents with clinical and radiographic syndrome of left occipital glioblastoma IDH-wt.   Today we extensively reviewed her clinical course, imaging, pathology, treatment pathways, and broader goals of care.  We are overall encouraged by the quality of her resection and her intact functional status.    We recommended initiating treatment with 6 weeks course of intensity modulated radiation therapy and concurrent temozolomide dosed at 753mm2 daily.  We reviewed side effects of chemotherapy including nasuea/vomiting, fatigue, cytopenias, constipation.  Chemotherapy should be held for the following:  ANC less than 1,000  Platelets less than 100,000  LFT or creatinine greater than 2x ULN  If clinical concerns/contraindications develop  She will likely begin therapy in 2 weeks, pending consultation and CT-simulation with radiation oncology.  Keppra can be discontinued now outside of immediate post-operative period.  Do not recommend driving due to visual field impairment.  Screening for potential clinical trials was performed and discussed using eligibility criteria for active protocols at [facility], loco-regional tertiary centers, as well as national database available on [email or web address].com.    The patient is not a candidate for a research protocol at this time due to no suitable study identified.   We spent twenty additional minutes teaching regarding the natural history, biology, and historical experience in the treatment of brain tumors. We then discussed in detail the current recommendations for therapy focusing on the mode of administration, mechanism  of action, anticipated toxicities, and quality of life issues associated with this plan. We also provided teaching sheets for the patient to take home as an additional resource.  All questions were answered. The patient knows to call the clinic with any problems, questions or concerns. No barriers to learning were detected.  The total time spent in the encounter was 60 minutes and more than 50% was on counseling and review of test results   Ventura Sellers, MD Medical Director of Neuro-Oncology [facility] at [place] 01/09/19 10:45 AM

## 2019-01-09 NOTE — Progress Notes (Signed)
START ON PATHWAY REGIMEN - Neuro     One cycle, daily for 42 days concurrent with RT:     Temozolomide   **Always confirm dose/schedule in your pharmacy ordering system**  Patient Characteristics: Glioblastoma (Grade IV Glioma), Newly Diagnosed / Treatment Naive, Good Performance Status and/or Younger Patient, MGMT Promoter Unmethylated/Unknown Disease Classification: Glioma Disease Classification: Glioblastoma (Grade IV Glioma) Disease Status: Newly Diagnosed / Treatment Naive Performance Status: Good Performance Status and/or Younger Patient MGMT Promoter Methylation Status: Awaiting Test Results Intent of Therapy: Non-Curative / Palliative Intent, Discussed with Patient 

## 2019-01-11 DIAGNOSIS — R600 Localized edema: Secondary | ICD-10-CM | POA: Diagnosis not present

## 2019-01-11 DIAGNOSIS — M79605 Pain in left leg: Secondary | ICD-10-CM | POA: Diagnosis not present

## 2019-01-11 DIAGNOSIS — M79604 Pain in right leg: Secondary | ICD-10-CM | POA: Diagnosis not present

## 2019-01-12 NOTE — Progress Notes (Signed)
Location/Histology of Brain Tumor:  12/23/18 FINAL MICROSCOPIC DIAGNOSIS: A. BRAIN, LEFT PARIETO-OCCIPITAL TUMOR, RESECTION: - Glioblastoma multiforme, WHO Grade IV  Patient presented with symptoms of:  Right sided visual impairment for two months. Her vision was fuzzy/blurry which progressed over time.   Past or anticipated interventions, if any, per neurosurgery:  12/23/18 PROCEDURE: 1. Stereotactic left parietal occipital craniotomy for resection of tumor 2. Use of intraoperative microscope for microdissection  SURGEON: Dr. Consuella Lose, MD   Past or anticipated interventions, if any, per medical oncology:  01/09/19 Dr. Mickeal Skinner Assessment/Plan Glioblastoma with isocitrate dehydrogenase gene wildtype ([place]) [C71.9]  We appreciate the opportunity to participate in the care of Jillian Gomez.  She presents with clinical and radiographic syndrome of left occipital glioblastoma IDH-wt.   Today we extensively reviewed her clinical course, imaging, pathology, treatment pathways, and broader goals of care.  We are overall encouraged by the quality of her resection and her intact functional status.    We recommended initiating treatment with 6 weeks course of intensity modulated radiation therapy and concurrent temozolomide dosed at 39m/m2 daily.  We reviewed side effects of chemotherapy including nasuea/vomiting, fatigue, cytopenias, constipation.  She will likely begin therapy in 2 weeks, pending consultation and CT-simulation with radiation oncology.   Dose of Decadron, if applicable: N/A  Recent neurologic symptoms, if any:   Seizures: No  Headaches: She reports a sharp pain in her head every now and then.   Nausea: She did have nausea after surgery.   Dizziness/ataxia: No  Difficulty with hand coordination: No  Focal numbness/weakness: No  Visual deficits/changes: She reports problems with peripheral vision on the right side. She feels like the vision over her  right eye is blurred when it really is not.   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 She is unable to read long numbers completely at times.   Painful bone metastases at present, if any: N/A  SAFETY ISSUES:  Prior radiation? No  Pacemaker/ICD? No  Possible current pregnancy? No  Is the patient on methotrexate? No  Additional Complaints / other details:

## 2019-01-13 LAB — SURGICAL PATHOLOGY

## 2019-01-16 ENCOUNTER — Inpatient Hospital Stay: Payer: BC Managed Care – PPO

## 2019-01-17 ENCOUNTER — Telehealth: Payer: Self-pay | Admitting: General Practice

## 2019-01-17 ENCOUNTER — Other Ambulatory Visit: Payer: Self-pay | Admitting: Internal Medicine

## 2019-01-17 ENCOUNTER — Ambulatory Visit
Admission: RE | Admit: 2019-01-17 | Discharge: 2019-01-17 | Disposition: A | Discharge status: Home / Self Care | Payer: BC Managed Care – PPO | Source: Ambulatory Visit | Attending: Radiation Oncology | Admitting: Radiation Oncology

## 2019-01-17 ENCOUNTER — Other Ambulatory Visit: Payer: Self-pay

## 2019-01-17 ENCOUNTER — Telehealth: Payer: Self-pay

## 2019-01-17 ENCOUNTER — Telehealth: Payer: Self-pay | Admitting: Pharmacist

## 2019-01-17 ENCOUNTER — Encounter: Payer: Self-pay | Admitting: Radiation Oncology

## 2019-01-17 DIAGNOSIS — Z510 Encounter for antineoplastic radiation therapy: Secondary | ICD-10-CM | POA: Insufficient documentation

## 2019-01-17 DIAGNOSIS — C713 Malignant neoplasm of parietal lobe: Secondary | ICD-10-CM | POA: Insufficient documentation

## 2019-01-17 DIAGNOSIS — H53461 Homonymous bilateral field defects, right side: Secondary | ICD-10-CM | POA: Diagnosis not present

## 2019-01-17 DIAGNOSIS — C719 Malignant neoplasm of brain, unspecified: Secondary | ICD-10-CM

## 2019-01-17 DIAGNOSIS — Z9089 Acquired absence of other organs: Secondary | ICD-10-CM | POA: Diagnosis not present

## 2019-01-17 MED ORDER — ONDANSETRON HCL 8 MG PO TABS: 8.0000 mg | ORAL_TABLET | Freq: Two times a day (BID) | ORAL | 1 refills | Status: DC | PRN

## 2019-01-17 MED ORDER — TEMOZOLOMIDE 100 MG PO CAPS: 100.0000 mg | ORAL_CAPSULE | Freq: Every day | ORAL | 0 refills | Status: DC

## 2019-01-17 MED FILL — ONDANSETRON HCL 8 MG TABLET: 8 | 15 days supply | Qty: 30 | Fill #0

## 2019-01-17 NOTE — Progress Notes (Signed)
Radiation Oncology         [phone] ________________________________  Initial outpatient telephone Consultation by telephone as patient was unable to access MyChart video during pandemic precautions   Name: Jillian Gomez MRN: [id number]  Date: 01/17/2019  DOB: 02/25/1955  RF:[id number], Lora Havens, MD  Mickeal Skinner Acey Lav, MD   REFERRING PHYSICIAN: Ventura Sellers, MD  DIAGNOSIS: C71.3   ICD-10-CM   1. Glioblastoma with isocitrate dehydrogenase gene wildtype [facility])  C71.9 Ambulatory referral to Social Work  2. Glioblastoma multiforme of parietal lobe (HCC)  C71.3     CHIEF COMPLAINT: Here to discuss management of glioblastoma  HISTORY OF PRESENT ILLNESS::Jillian Gomez is a 64 y.o. female who presented with a two month history of worsening right-sided visual impairment, as well as gait disturbance. She underwent head MRI without contrast on 12/05/2018, which revealed: 3 cm intra-axial mass at the left parietoccipital junction; mild adjacent vasogenic edema; no significant mass effect or hemorrhage. Contrast study was recommended, and this was performed on 12/09/2018 and showed: left parieto-occipital lesion is most consistent with a high grade glial neoplasm, metastasis is considered much less likely. Chest, abdomen, pelvis CT was also performed on 12/09/2018 and showed: no acute intrathoracic pathology, nor mets in abd/pelvis.  She proceeded to craniotomy on 12/23/2018 under Dr. Kathyrn Sheriff. Pathology from the procedure revealed: glioblastoma multiforme, WHO grade IV, IDH1/2 mutations not detected.  Postoperative brain MRI performed on 12/24/2018 showed: minor enhancement along resection margins without residual masslike enhancement; mild persistent left parieto-occipital edema.  She met with Dr. Mickeal Skinner on 01/09/2019, who recommended concurrent radiation therapy and temozolomide.  She is back at work, works as a Water engineer.  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  Renda Rolls exam   PREVIOUS RADIATION THERAPY: No  PAST MEDICAL HISTORY:  has a past medical history of High cholesterol, Hypothyroidism, and Joint pain.    PAST SURGICAL HISTORY: Past Surgical History:  Procedure Laterality Date  . APPLICATION OF CRANIAL NAVIGATION Left 12/23/2018   Procedure: APPLICATION OF CRANIAL NAVIGATION;  Surgeon: Consuella Lose, MD;  Location: [place];  Service: Neurosurgery;  Laterality: Left;  APPLICATION OF CRANIAL NAVIGATION  . BREAST BIOPSY Left   . COLONOSCOPY    . CRANIOTOMY Left 12/23/2018   Procedure: STEREOTACTIC LEFT OCCIPITAL CRANIOTOMY FOR RESECTION OF TUMOR;  Surgeon: Consuella Lose, MD;  Location: [place];  Service: Neurosurgery;  Laterality: Left;  STEREOTACTIC LEFT OCCIPITAL CRANIOTOMY FOR RESECTION OF TUMOR  . LASIK      FAMILY HISTORY: family history is not on file.  SOCIAL HISTORY:  reports that she has never smoked. She has never used smokeless tobacco. She reports previous alcohol use. She reports that she does not use drugs.  ALLERGIES: Etodolac and Macrobid [nitrofurantoin]  MEDICATIONS:  Current Outpatient Medications  Medication Sig Dispense Refill  . aspirin 81 MG chewable tablet Chew 1 tablet (81 mg total) by mouth daily.    . Biotin 1 MG CAPS Take 1 mg by mouth daily.    Marland Kitchen CALCIUM PO Take 1 tablet by mouth daily.    Arna Medici 50 MCG tablet Take 50 mcg by mouth daily.    . naproxen sodium (ALEVE) 220 MG tablet Take 1 tablet (220 mg total) by mouth daily as needed (pain).    Marland Kitchen atorvastatin (LIPITOR) 40 MG tablet Take 40 mg by mouth daily.    Marland Kitchen levETIRAcetam (KEPPRA) 500 MG tablet Take 1 tablet (500 mg total) by mouth 2 (two) times daily. (Patient not  taking: Reported on 01/17/2019) 60 tablet 2  . ondansetron (ZOFRAN) 8 MG tablet Take 1 tablet (8 mg total) by mouth 2 (two) times daily as needed (nausea and vomiting). May take 30-60 minutes prior to Temodar administration if nausea/vomiting occurs. 30 tablet 1  .  oxyCODONE-acetaminophen (PERCOCET) 7.5-325 MG tablet Take 1 tablet by mouth every 4 (four) hours as needed for severe pain. (Patient not taking: Reported on 01/17/2019) 30 tablet 0  . temozolomide (TEMODAR) 100 MG capsule Take 1 capsule (100 mg total) by mouth daily. May take on an empty stomach to decrease nausea & vomiting. 42 capsule 0   No current facility-administered medications for this encounter.    REVIEW OF SYSTEMS:  Notable for that above.   PHYSICAL EXAM:  vitals were not taken for this visit.    NAD, alert, oriented  LABORATORY DATA:  Lab Results  Component Value Date   WBC 14.7 (H) 12/24/2018   HGB 13.0 12/24/2018   HCT 39.0 12/24/2018   MCV 85.7 12/24/2018   PLT 321 12/24/2018   CMP     Component Value Date/Time   NA 136 12/25/2018 1029   K 3.9 12/25/2018 1029   CL 104 12/25/2018 1029   CO2 23 12/25/2018 1029   GLUCOSE 142 (H) 12/25/2018 1029   BUN 11 12/25/2018 1029   CREATININE 0.92 12/25/2018 1029   CALCIUM 8.5 (L) 12/25/2018 1029   GFRNONAA >60 12/25/2018 1029   GFRAA >60 12/25/2018 1029         RADIOGRAPHY I have personally reviewed her images: MR BRAIN W WO CONTRAST  Result Date: 12/24/2018 CLINICAL DATA:  Postop day 1 status post resection of left parieto-occipital tumor. EXAM: MRI HEAD WITHOUT AND WITH CONTRAST TECHNIQUE: Multiplanar, multiecho pulse sequences of the brain and surrounding structures were obtained without and with intravenous contrast. CONTRAST:  22m GADAVIST GADOBUTROL 1 MMOL/ML IV SOLN COMPARISON:  12/09/2018 FINDINGS: Brain: Sequelae of interval craniotomy and left parieto-occipital tumor resection are identified. There is a 4 cm resection cavity containing blood products and gas, and scattered small volume pneumocephalus is also noted elsewhere. There is trace subdural fluid and postoperative dural enhancement over the left cerebral convexity. Minimal diffusion weighted signal abnormality is noted along the margins of the resection  cavity without evidence of a sizable perioperative infarct. There is mild curvilinear enhancement along the medial and superior aspects of the resection cavity without masslike enhancement. There is persistent mild left parieto-occipital vasogenic edema with mild mass effect on the left lateral ventricle. There is no ventriculomegaly or midline shift. The brain is normal in signal elsewhere. Vascular: Major intracranial vascular flow voids are preserved. Skull and upper cervical spine: Left parieto-occipital craniotomy. No suspicious marrow lesion. Sinuses/Orbits: Unremarkable orbits. Paranasal sinuses and mastoid air cells are clear. Other: None. IMPRESSION: 1. Postoperative changes from interval left parieto-occipital tumor resection. Minor enhancement along the margins of the resection cavity without residual masslike enhancement. 2. Mild persistent left parieto-occipital edema. Electronically Signed   By: ALogan BoresM.D.   On: 12/24/2018 17:21      IMPRESSION/PLAN: Glioblastoma IDH Wildtype, WHO Grade IV  Today, I talked to the patient about the findings and work-up thus far. We discussed the patient's diagnosis of glioblastoma and general treatment for this, highlighting the role of radiotherapy in the management. We discussed the available radiation techniques, and focused on the details of logistics and delivery.    We discussed the risks, benefits, and side effects of a 6-week regimen of partial brain  radiotherapy. Side effects may include but not necessarily be limited to: Hair loss which could be permanent in certain portions of the scalp, fatigue, headache, long-term cognitive changes, nausea, scalp irritation; no guarantees of treatment were given. The patient was encouraged to ask questions that I answered to the best of my ability. She wants to be aggressive in her treatment and pursue adjuvant radiation therapy to have the best chance of achieving local control where her tumor was  resected.  She plans to receive concurrent temozolomide.  CT simulation will take place today and we will expedite her plan so that she can start radiotherapy at the beginning of next week.  We did talk about the option of pursuing treatment closer to home in [place] but ultimately she prefers to get treatment here in [place].  She has concerns about transportation and I will refer her to social work.     This encounter was provided by telemedicine platform by telephone as patient was unable to access MyChart video during pandemic precautions The patient has given verbal consent for this type of encounter and has been advised to only accept a meeting of this type in a secure network environment. The time spent during this encounter on date of service, in total, was 60 minutes. The attendants for this meeting include Eppie Gibson  and Nadyne Coombes.  During the encounter, Eppie Gibson was located at [facility] Radiation Oncology Department.  ADRAINE BIFFLE was located at home.    __________________________________________   Eppie Gibson, MD  This document serves as a record of services personally performed by Eppie Gibson, MD. It was created on her behalf by Wilburn Mylar, a trained medical scribe. The creation of this record is based on the scribe's personal observations and the provider's statements to them. This document has been checked and approved by the attending provider.

## 2019-01-17 NOTE — Telephone Encounter (Signed)
[name] CSW Progress Notes  Referral received - patient reports concerns with transportation.  Called patient, unable to reach.  Left generic VM on mobile number w request to call back for help/resources.  Edwyna Shell, LCSW Clinical Social Worker Phone:  [phone] Cell:  [phone]

## 2019-01-17 NOTE — Telephone Encounter (Signed)
Oral Oncology Pharmacist Encounter  Received new prescription for Temodar (temozolomide) for the treatment of glioblastoma in conjunction with radiation therapy, planned duration 6 weeks. Planned start 01/23/19 with radiation.  Prescription dose and frequency assessed.   Current medication list in Epic reviewed, no DDIs with temozolomide identified.  Prescription has been e-scribed to the [facility] for benefits analysis and approval.  Oral Oncology Clinic will continue to follow for insurance authorization, copayment issues, initial counseling and start date.  Darl Pikes, PharmD, BCPS, [facility] Hematology/Oncology Clinical Pharmacist [facility] [place] [facility] [phone]  01/17/2019 10:39 AM

## 2019-01-17 NOTE — Telephone Encounter (Signed)
Oral Oncology Patient Advocate Encounter  Received notification from [facility] that prior authorization for Temozolomide is required.  PA submitted on CoverMyMeds Key [id number] Status is pending  Oral Oncology Clinic will continue to follow.  [place] Patient [place] Phone [phone] Fax [phone] 01/17/2019 1:25 PM

## 2019-01-17 NOTE — Progress Notes (Signed)
Jillian Gomez:[id number] [birth year]   SIMULATION AND TREATMENT PLANNING NOTE, SPECIAL TREATMENT PROCEDURE  OUTPATIENT  DIAGNOSIS:    ICD-10-CM   1. Glioblastoma multiforme of parietal lobe (HCC)  C71.3     NARRATIVE:  The patient was brought to the [place].  Identity was confirmed.  All relevant records and images related to the planned course of therapy were reviewed.  The patient freely provided informed written consent to proceed with treatment after reviewing the details related to the planned course of therapy. The consent form was witnessed and verified by the simulation staff.    Then, the patient was set-up in a stable reproducible  supine position for radiation therapy. Aquaplast head mask was made for immobilization.  CT images were obtained.  Surface markings were placed.  The CT images were loaded into the planning software.    TREATMENT PLANNING NOTE: Treatment planning then occurred.  The radiation prescription was entered and confirmed.    A total of 1 medically necessary complex treatment devices were fabricated and supervised by me, in the form of Aquaplast mask.   MULTIPLE FIELDS WITH MLCs MAY DESIGNED IN DOSIMETRY for dose homogeneity.  I have requested : Intensity Modulated Radiotherapy (IMRT) is medically necessary for this case for the following reason:  Critical CNS structure avoidance - brainstem, optic chiasm, optic nerves, brain.  The patient will receive 46 Gy in 23 fractions initially to left parietal occipital region of brain, followed by a boost of 14 Gy in 7 fractions.  Special Treatment Procedure Note: The patient will be receiving chemotherapy concurrently. Chemotherapy heightens the risk of side effects. I have considered this during the patient's treatment planning process and will monitor the patient accordingly for side effects on a weekly basis. Concurrent chemotherapy increases the complexity of this patient's treatment and therefore  this constitutes a special treatment procedure.  -----------------------------------  Eppie Gibson, MD

## 2019-01-18 ENCOUNTER — Encounter: Payer: Self-pay | Admitting: Radiation Oncology

## 2019-01-18 DIAGNOSIS — C713 Malignant neoplasm of parietal lobe: Secondary | ICD-10-CM | POA: Diagnosis not present

## 2019-01-18 DIAGNOSIS — Z510 Encounter for antineoplastic radiation therapy: Secondary | ICD-10-CM | POA: Diagnosis not present

## 2019-01-18 NOTE — Telephone Encounter (Signed)
Oral Oncology Patient Advocate Encounter  Prior Authorization for Temozolomide has been approved.    PA# [id number] Effective dates: 01/17/19 through 01/16/20  Patients co-pay is $0  Oral Oncology Clinic will continue to follow.   [name] Phone [phone] Fax [phone] 01/18/2019 8:37 AM

## 2019-01-19 ENCOUNTER — Telehealth: Payer: Self-pay | Admitting: Radiation Therapy

## 2019-01-19 MED FILL — TEMOZOLOMIDE 100 MG CAPS: 100 | 30 days supply | Qty: 30 | Fill #0

## 2019-01-19 NOTE — Telephone Encounter (Signed)
Oral Chemotherapy Pharmacist Encounter  Jillian Gomez plans on picking up her medication from [place] ahead of her appt on 01/23/19.  Patient Education I spoke with patient for overview of new oral chemotherapy medication: Temodar (temozolomide) for the treatment of glioblastoma in conjunction with radiation therapy, planned duration 6 weeks. Planned start 01/23/19 with radiation.   Counseled patient on administration, dosing, side effects, monitoring, drug-food interactions, safe handling, storage, and disposal. Patient will take 1 capsule (100 mg total) by mouth daily. May take on an empty stomach to decrease nausea & vomiting.  Side effects include but not limited to: N/V, decreased wbc/hgb/plt, constipation, fatigue.    Reviewed with patient importance of keeping a medication schedule and plan for any missed doses.  Jillian Gomez voiced understanding and appreciation. All questions answered. Medication handout placed in the mail.  Provided patient with [facility] phone number. Patient knows to call the office with questions or concerns. Oral Chemotherapy Navigation Clinic will continue to follow.  Darl Pikes, PharmD, BCPS, [facility] Hematology/Oncology Clinical Pharmacist [facility] [place] [facility] [phone]  01/19/2019 12:52 PM

## 2019-01-19 NOTE — Telephone Encounter (Signed)
The patient called this morning asking about the Temozolomide she is supposed to take with her radiation. She lives in [place] and does not plan to come back to [facility] until Monday 1/18, when her treatments begin. She is asking if the medication will be mailed to her or sent to a local pharmacy for her to pick up. I do not know the answer to those questions, so I left a message with the specialty pharmacy patient advocate and reached out to Dr. Renda Rolls nurse, Shelle Iron.  Jillian Gomez is awaiting a call back with instruction/information.   Mont Dutton R.T.(R)(T) Radiation Special Procedures Navigator

## 2019-01-19 NOTE — Telephone Encounter (Signed)
Oral Chemotherapy Pharmacist Encounter   We attempted to call the patient this morning to set her up for medication pick up from [place]. Benjamine Mola (patient advocate) LVM for her to call us back. We will attempt another call today. Medication will be available to the patient to pick up prior to her planned start on 01/23/19 if we are able to get in contact with her.  Darl Pikes, PharmD, BCPS, [facility] Hematology/Oncology Clinical Pharmacist [facility] [place] [facility] [phone]  01/19/2019 11:55 AM

## 2019-01-23 ENCOUNTER — Encounter: Payer: Self-pay | Admitting: *Deleted

## 2019-01-23 ENCOUNTER — Other Ambulatory Visit: Payer: Self-pay

## 2019-01-23 ENCOUNTER — Encounter: Payer: Self-pay | Admitting: Internal Medicine

## 2019-01-23 ENCOUNTER — Telehealth: Payer: Self-pay | Admitting: Internal Medicine

## 2019-01-23 ENCOUNTER — Ambulatory Visit
Admission: RE | Admit: 2019-01-23 | Discharge: 2019-01-23 | Disposition: A | Discharge status: Home / Self Care | Payer: BC Managed Care – PPO | Source: Ambulatory Visit | Attending: Radiation Oncology | Admitting: Radiation Oncology

## 2019-01-23 ENCOUNTER — Telehealth: Payer: Self-pay | Admitting: *Deleted

## 2019-01-23 DIAGNOSIS — Z510 Encounter for antineoplastic radiation therapy: Secondary | ICD-10-CM | POA: Diagnosis not present

## 2019-01-23 DIAGNOSIS — C713 Malignant neoplasm of parietal lobe: Secondary | ICD-10-CM

## 2019-01-23 MED ORDER — SONAFINE EX EMUL
1.0000 "application " | Freq: Once | CUTANEOUS | Status: AC
  Administered 2019-01-23: 1 via TOPICAL

## 2019-01-23 NOTE — Telephone Encounter (Signed)
Patient came up after her radiation appt to review her instructions for when and how to take her Temodar.  She also requested a letter for clearance to take the COVID vaccine.  Printed and provided directly to the patient.

## 2019-01-23 NOTE — Progress Notes (Signed)
[place] Work  Clinical Social Work received a referral patient was requesting transportation resources.  CSW contacted patient by phone to further assess needs. Ms. Suttner reported she plans to drive herself to treatments, but was exploring resources available if she would need assistance. CSW explained there are no transportation options to [place], other than gas card assistance.  Patient plans to utilize friends/family for transportation if she is unable to drive herself.  CSW briefly explored patient's response to treatment and other needs.  CSW encouraged patient to call with questions or concerns.  Gwinda Maine, LCSW  Clinical Social Worker [facility]

## 2019-01-23 NOTE — Progress Notes (Signed)
Pt here for patient teaching.  Pt given Radiation and You booklet, skin care instructions and Sonafine.  Reviewed areas of pertinence such as fatigue, hair loss, nausea and vomiting, skin changes and headache . Pt able to give teach back of to pat skin, use unscented/gentle soap and drink plenty of water,apply Sonafine bid and avoid applying anything to skin within 4 hours of treatment. Pt verbalizes understanding of information given and will contact nursing with any questions or concerns.     Http://rtanswers.org/treatmentinformation/whattoexpect/index

## 2019-01-23 NOTE — Telephone Encounter (Signed)
Scheduled per 1/18 sch msg. Confirmed appts with pt and gave pt a printout

## 2019-01-24 ENCOUNTER — Ambulatory Visit
Admission: RE | Admit: 2019-01-24 | Discharge: 2019-01-24 | Disposition: A | Discharge status: Home / Self Care | Payer: BC Managed Care – PPO | Source: Ambulatory Visit | Attending: Radiation Oncology | Admitting: Radiation Oncology

## 2019-01-24 ENCOUNTER — Other Ambulatory Visit: Payer: Self-pay

## 2019-01-24 DIAGNOSIS — Z510 Encounter for antineoplastic radiation therapy: Secondary | ICD-10-CM | POA: Diagnosis not present

## 2019-01-24 DIAGNOSIS — C713 Malignant neoplasm of parietal lobe: Secondary | ICD-10-CM | POA: Diagnosis not present

## 2019-01-25 ENCOUNTER — Other Ambulatory Visit: Payer: Self-pay

## 2019-01-25 ENCOUNTER — Ambulatory Visit
Admission: RE | Admit: 2019-01-25 | Discharge: 2019-01-25 | Disposition: A | Discharge status: Home / Self Care | Payer: BC Managed Care – PPO | Source: Ambulatory Visit | Attending: Radiation Oncology | Admitting: Radiation Oncology

## 2019-01-25 DIAGNOSIS — C713 Malignant neoplasm of parietal lobe: Secondary | ICD-10-CM | POA: Diagnosis not present

## 2019-01-25 DIAGNOSIS — Z510 Encounter for antineoplastic radiation therapy: Secondary | ICD-10-CM | POA: Diagnosis not present

## 2019-01-26 ENCOUNTER — Ambulatory Visit
Admission: RE | Admit: 2019-01-26 | Discharge: 2019-01-26 | Disposition: A | Discharge status: Home / Self Care | Payer: BC Managed Care – PPO | Source: Ambulatory Visit | Attending: Radiation Oncology | Admitting: Radiation Oncology

## 2019-01-26 DIAGNOSIS — Z510 Encounter for antineoplastic radiation therapy: Secondary | ICD-10-CM | POA: Diagnosis not present

## 2019-01-26 DIAGNOSIS — C713 Malignant neoplasm of parietal lobe: Secondary | ICD-10-CM | POA: Diagnosis not present

## 2019-01-27 ENCOUNTER — Other Ambulatory Visit: Payer: Self-pay

## 2019-01-27 ENCOUNTER — Ambulatory Visit
Admission: RE | Admit: 2019-01-27 | Discharge: 2019-01-27 | Disposition: A | Discharge status: Home / Self Care | Payer: BC Managed Care – PPO | Source: Ambulatory Visit | Attending: Radiation Oncology | Admitting: Radiation Oncology

## 2019-01-27 DIAGNOSIS — Z510 Encounter for antineoplastic radiation therapy: Secondary | ICD-10-CM | POA: Diagnosis not present

## 2019-01-27 DIAGNOSIS — C713 Malignant neoplasm of parietal lobe: Secondary | ICD-10-CM | POA: Diagnosis not present

## 2019-01-30 ENCOUNTER — Ambulatory Visit
Admission: RE | Admit: 2019-01-30 | Discharge: 2019-01-30 | Disposition: A | Discharge status: Home / Self Care | Payer: BC Managed Care – PPO | Source: Ambulatory Visit | Attending: Radiation Oncology | Admitting: Radiation Oncology

## 2019-01-30 ENCOUNTER — Other Ambulatory Visit: Payer: Self-pay

## 2019-01-30 DIAGNOSIS — C713 Malignant neoplasm of parietal lobe: Secondary | ICD-10-CM | POA: Diagnosis not present

## 2019-01-30 DIAGNOSIS — Z510 Encounter for antineoplastic radiation therapy: Secondary | ICD-10-CM | POA: Diagnosis not present

## 2019-01-31 ENCOUNTER — Other Ambulatory Visit: Payer: Self-pay

## 2019-01-31 ENCOUNTER — Ambulatory Visit
Admission: RE | Admit: 2019-01-31 | Discharge: 2019-01-31 | Disposition: A | Discharge status: Home / Self Care | Payer: BC Managed Care – PPO | Source: Ambulatory Visit | Attending: Radiation Oncology | Admitting: Radiation Oncology

## 2019-01-31 DIAGNOSIS — Z510 Encounter for antineoplastic radiation therapy: Secondary | ICD-10-CM | POA: Diagnosis not present

## 2019-01-31 DIAGNOSIS — C713 Malignant neoplasm of parietal lobe: Secondary | ICD-10-CM | POA: Diagnosis not present

## 2019-02-01 ENCOUNTER — Other Ambulatory Visit: Payer: Self-pay

## 2019-02-01 ENCOUNTER — Ambulatory Visit
Admission: RE | Admit: 2019-02-01 | Discharge: 2019-02-01 | Disposition: A | Discharge status: Home / Self Care | Payer: BC Managed Care – PPO | Source: Ambulatory Visit | Attending: Radiation Oncology | Admitting: Radiation Oncology

## 2019-02-01 ENCOUNTER — Encounter: Payer: Self-pay | Admitting: Radiation Therapy

## 2019-02-01 DIAGNOSIS — Z510 Encounter for antineoplastic radiation therapy: Secondary | ICD-10-CM | POA: Diagnosis not present

## 2019-02-01 DIAGNOSIS — C713 Malignant neoplasm of parietal lobe: Secondary | ICD-10-CM | POA: Diagnosis not present

## 2019-02-01 NOTE — Progress Notes (Signed)
Ms. Klute has been referred to see Dr. Wyatt Portela for evaluation of new floaters and "flashes of light" in her left eye. Office notes from Dr. Isidore Moos and Dr. Mickeal Skinner have been faxed to Dr. Zenia Resides office.   Per Dr. Pearlie Oyster request, this visit has been scheduled after the completion of radiation treatments.   03/08/19 @ 9:45 [place] [address] [phone]    Mont Dutton R.T.(R)(T) Radiation Special Procedures Navigator

## 2019-02-02 ENCOUNTER — Other Ambulatory Visit: Payer: Self-pay

## 2019-02-02 ENCOUNTER — Inpatient Hospital Stay: Payer: BC Managed Care – PPO

## 2019-02-02 ENCOUNTER — Inpatient Hospital Stay: Payer: BC Managed Care – PPO | Admitting: Internal Medicine

## 2019-02-02 ENCOUNTER — Ambulatory Visit
Admission: RE | Admit: 2019-02-02 | Discharge: 2019-02-02 | Disposition: A | Discharge status: Home / Self Care | Payer: BC Managed Care – PPO | Source: Ambulatory Visit | Attending: Radiation Oncology | Admitting: Radiation Oncology

## 2019-02-02 VITALS — BP 119/77 | HR 84 | Temp 97.2°F | Resp 18 | Ht 62.0 in | Wt 114.0 lb

## 2019-02-02 DIAGNOSIS — Z7982 Long term (current) use of aspirin: Secondary | ICD-10-CM | POA: Diagnosis not present

## 2019-02-02 DIAGNOSIS — E7800 Pure hypercholesterolemia, unspecified: Secondary | ICD-10-CM | POA: Diagnosis not present

## 2019-02-02 DIAGNOSIS — Z79899 Other long term (current) drug therapy: Secondary | ICD-10-CM | POA: Diagnosis not present

## 2019-02-02 DIAGNOSIS — C713 Malignant neoplasm of parietal lobe: Secondary | ICD-10-CM | POA: Diagnosis not present

## 2019-02-02 DIAGNOSIS — C719 Malignant neoplasm of brain, unspecified: Secondary | ICD-10-CM | POA: Diagnosis not present

## 2019-02-02 DIAGNOSIS — Z510 Encounter for antineoplastic radiation therapy: Secondary | ICD-10-CM | POA: Diagnosis not present

## 2019-02-02 DIAGNOSIS — C714 Malignant neoplasm of occipital lobe: Secondary | ICD-10-CM | POA: Diagnosis not present

## 2019-02-02 DIAGNOSIS — E039 Hypothyroidism, unspecified: Secondary | ICD-10-CM | POA: Diagnosis not present

## 2019-02-02 LAB — CMP (CANCER CENTER ONLY)
ALT: 12 U/L (ref 0–44)
AST: 16 U/L (ref 15–41)
Albumin: 4 g/dL (ref 3.5–5.0)
Alkaline Phosphatase: 103 U/L (ref 38–126)
Anion gap: 10 (ref 5–15)
BUN: 17 mg/dL (ref 8–23)
CO2: 28 mmol/L (ref 22–32)
Calcium: 9.6 mg/dL (ref 8.9–10.3)
Chloride: 104 mmol/L (ref 98–111)
Creatinine: 1.04 mg/dL — ABNORMAL HIGH (ref 0.44–1.00)
GFR, Est AFR Am: >60 mL/min (ref >60)
GFR, Estimated: 57 mL/min — ABNORMAL LOW (ref >60)
Glucose, Bld: 94 mg/dL (ref 70–99)
Potassium: 4.4 mmol/L (ref 3.5–5.1)
Sodium: 142 mmol/L (ref 135–145)
Total Bilirubin: 0.4 mg/dL (ref 0.3–1.2)
Total Protein: 7.1 g/dL (ref 6.5–8.1)

## 2019-02-02 LAB — CBC WITH DIFFERENTIAL (CANCER CENTER ONLY)
Abs Immature Granulocytes: 0.01 10*3/uL (ref 0.00–0.07)
Basophils Absolute: 0.1 10*3/uL (ref 0.0–0.1)
Basophils Relative: 1 %
Eosinophils Absolute: 0.2 10*3/uL (ref 0.0–0.5)
Eosinophils Relative: 3 %
HCT: 42.8 % (ref 36.0–46.0)
Hemoglobin: 13.9 g/dL (ref 12.0–15.0)
Immature Granulocytes: 0 %
Lymphocytes Relative: 29 %
Lymphs Abs: 1.7 10*3/uL (ref 0.7–4.0)
MCH: 27.9 pg (ref 26.0–34.0)
MCHC: 32.5 g/dL (ref 30.0–36.0)
MCV: 85.8 fL (ref 80.0–100.0)
Monocytes Absolute: 0.5 10*3/uL (ref 0.1–1.0)
Monocytes Relative: 8 %
Neutro Abs: 3.5 10*3/uL (ref 1.7–7.7)
Neutrophils Relative %: 59 %
Platelet Count: 349 10*3/uL (ref 150–400)
RBC: 4.99 MIL/uL (ref 3.87–5.11)
RDW: 12.4 % (ref 11.5–15.5)
WBC Count: 5.9 10*3/uL (ref 4.0–10.5)
nRBC: 0 % (ref 0.0–0.2)

## 2019-02-02 NOTE — Progress Notes (Signed)
[facility] [place], [place] [address] [phone]   Interval Evaluation  Date of Service: 02/02/19 Patient Name: Jillian Gomez Patient MRN: [id number] Patient DOB: 1955/10/26 Provider: Ventura Sellers, MD  Identifying Statement:  Jillian Gomez is a 64 y.o. female with left occipital glioblastoma   Oncologic History: Oncology History  Glioblastoma with isocitrate dehydrogenase gene wildtype ([place])  12/23/2018 Surgery   Craniotomy, left occipital resection by Dr. Kathyrn Sheriff.  Path is GBM IDH-wt   01/17/2019 -  Chemotherapy   The patient had [No matching medication found in this treatment plan]  for chemotherapy treatment.      Biomarkers:  MGMT Unknown.  IDH 1/2 Wild type.  EGFR Unknown  TERT Unknown   Interval History:  Jillian Gomez presents today for follow up, now in week #2 of radiation and concurrent Temodar.  She has been tolerating treatment well without any ill effects thus far.  No new or progressive deficits, no headaches or seizures.  Remains functionally independent.  H+P (01/09/19) Patient presented to medical attention in early December with ~2 months history of right sided visual impairment.  She describes fuzzy or blurry vision on that side which was progressive over time, leading to at least one fall.  MRI brain demonstrated enhancing left occipital mass; this was subsequently resected by Dr. Kathyrn Sheriff on 12/23/18.  She had no issues with surgery and has completed her steroid taper.  Continues on keppra daily but no history of any seizure. She has returned to work with only modest difficutly.  No issues walking or performing ADLs; lives alone but her sister and other family members are nearby.  Medications: Current Outpatient Medications on File Prior to Visit  Medication Sig Dispense Refill  . aspirin 81 MG chewable tablet Chew 1 tablet (81 mg total) by mouth daily.    . Biotin 1 MG CAPS Take 1 mg by  mouth daily.    Marland Kitchen CALCIUM PO Take 1 tablet by mouth daily.    Jillian Gomez 50 MCG tablet Take 50 mcg by mouth daily.    . ondansetron (ZOFRAN) 8 MG tablet Take 1 tablet (8 mg total) by mouth 2 (two) times daily as needed (nausea and vomiting). May take 30-60 minutes prior to Temodar administration if nausea/vomiting occurs. 30 tablet 1  . temozolomide (TEMODAR) 100 MG capsule Take 1 capsule (100 mg total) by mouth daily. May take on an empty stomach to decrease nausea & vomiting. 42 capsule 0  . atorvastatin (LIPITOR) 40 MG tablet Take 40 mg by mouth daily.    . naproxen sodium (ALEVE) 220 MG tablet Take 1 tablet (220 mg total) by mouth daily as needed (pain). (Patient not taking: Reported on 02/02/2019)     No current facility-administered medications on file prior to visit.    Allergies:  Allergies  Allergen Reactions  . Etodolac Other (See Comments)    GI upset  . Macrobid [Nitrofurantoin] Rash   Past Medical History:  Past Medical History:  Diagnosis Date  . High cholesterol   . Hypothyroidism   . Joint pain    Past Surgical History:  Past Surgical History:  Procedure Laterality Date  . APPLICATION OF CRANIAL NAVIGATION Left 12/23/2018   Procedure: APPLICATION OF CRANIAL NAVIGATION;  Surgeon: Consuella Lose, MD;  Location: [facility];  Service: Neurosurgery;  Laterality: Left;  APPLICATION OF CRANIAL NAVIGATION  . BREAST BIOPSY Left   . COLONOSCOPY    . CRANIOTOMY  Left 12/23/2018   Procedure: STEREOTACTIC LEFT OCCIPITAL CRANIOTOMY FOR RESECTION OF TUMOR;  Surgeon: Consuella Lose, MD;  Location: [facility];  Service: Neurosurgery;  Laterality: Left;  STEREOTACTIC LEFT OCCIPITAL CRANIOTOMY FOR RESECTION OF TUMOR  . LASIK     Social History:  Social History   Socioeconomic History  . Marital status: Single    Spouse name: Not on file  . Number of children: Not on file  . Years of education: Not on file  . Highest education level: Not on file  Occupational History  . Not on  file  Tobacco Use  . Smoking status: Never Smoker  . Smokeless tobacco: Never Used  Substance and Sexual Activity  . Alcohol use: Not Currently  . Drug use: Never  . Sexual activity: Not on file  Other Topics Concern  . Not on file  Social History Narrative  . Not on file   Social Determinants of Health   Financial Resource Strain:   . Difficulty of Paying Living Expenses: Not on file  Food Insecurity:   . Worried About Charity fundraiser in the Last Year: Not on file  . Ran Out of Food in the Last Year: Not on file  Transportation Needs:   . Lack of Transportation (Medical): Not on file  . Lack of Transportation (Non-Medical): Not on file  Physical Activity:   . Days of Exercise per Week: Not on file  . Minutes of Exercise per Session: Not on file  Stress:   . Feeling of Stress : Not on file  Social Connections:   . Frequency of Communication with Friends and Family: Not on file  . Frequency of Social Gatherings with Friends and Family: Not on file  . Attends Religious Services: Not on file  . Active Member of Clubs or Organizations: Not on file  . Attends Archivist Meetings: Not on file  . Marital Status: Not on file  Intimate Partner Violence:   . Fear of Current or Ex-Partner: Not on file  . Emotionally Abused: Not on file  . Physically Abused: Not on file  . Sexually Abused: Not on file   Family History: No family history on file.  Review of Systems: Constitutional: Denies fevers, chills or abnormal weight loss Eyes: Denies blurriness of vision Gastrointestinal:  Denies nausea, constipation, diarrhea GU: Denies dysuria or incontinence Skin: Denies abnormal skin rashes Musculoskeletal: Denies joint pain, back or neck discomfort.  Behavioral/Psych: Denies anxiety, mood instability  Physical Exam: Vitals:   02/02/19 0946  BP: 119/77  Pulse: 84  Resp: 18  Temp: (!) 97.2 F (36.2 C)  SpO2: 100%   KPS: 80. General: Alert, cooperative,  pleasant, in no acute distress Head: Craniotomy scar noted, dry and intact. EENT: No conjunctival injection or scleral icterus. Oral mucosa moist Lungs: Resp effort normal Cardiac: Regular rate and rhythm Abdomen: Soft, non-distended abdomen Skin: No rashes cyanosis or petechiae. Extremities: No clubbing or edema  Neurologic Exam: Mental Status: Awake, alert, attentive to examiner. Oriented to self and environment. Language is fluent with intact comprehension.  Cranial Nerves: Visual acuity is grossly normal. Right homonymous hemianopia. Extra-ocular movements intact. No ptosis. Face is symmetric, tongue midline. Motor: Tone and bulk are normal. Power is full in both arms and legs. Reflexes are symmetric, no pathologic reflexes present. Intact finger to nose bilaterally Sensory: Intact to light touch and temperature Gait: Normal and tandem gait is deferred.   Labs: I have reviewed the data as listed  Component Value Date/Time   NA 136 12/25/2018 1029   K 3.9 12/25/2018 1029   CL 104 12/25/2018 1029   CO2 23 12/25/2018 1029   GLUCOSE 142 (H) 12/25/2018 1029   BUN 11 12/25/2018 1029   CREATININE 0.92 12/25/2018 1029   CALCIUM 8.5 (L) 12/25/2018 1029   GFRNONAA >60 12/25/2018 1029   GFRAA >60 12/25/2018 1029   Lab Results  Component Value Date   WBC 5.9 02/02/2019   NEUTROABS 3.5 02/02/2019   HGB 13.9 02/02/2019   HCT 42.8 02/02/2019   MCV 85.8 02/02/2019   PLT 349 02/02/2019     Assessment/Plan Glioblastoma with isocitrate dehydrogenase gene wildtype ([facility]) [C71.9]   Jillian Gomez is clinically stable, now in week 2/6 of IMRT and concurrent Temodar.  Labs are within normal limits.  We recommended continuing treatment with 6 weeks course of intensity modulated radiation therapy and concurrent temozolomide dosed at 18m/m2 daily.    Chemotherapy should be held for the following:  ANC less than 1,000  Platelets less than 100,000  LFT or creatinine greater than 2x  ULN  If clinical concerns/contraindications develop  She should return to clinic in 2 weeks with labs for evaluation.  All questions were answered. The patient knows to call the clinic with any problems, questions or concerns. No barriers to learning were detected.  The total time spent in the encounter was 25 minutes and more than 50% was on counseling and review of test results   ZVentura Sellers MD Medical Director of Neuro-Oncology [facility]at WR[facility]01/28/21 9:55 AM

## 2019-02-03 ENCOUNTER — Ambulatory Visit
Admission: RE | Admit: 2019-02-03 | Discharge: 2019-02-03 | Disposition: A | Discharge status: Home / Self Care | Payer: BC Managed Care – PPO | Source: Ambulatory Visit | Attending: Radiation Oncology | Admitting: Radiation Oncology

## 2019-02-03 ENCOUNTER — Other Ambulatory Visit: Payer: Self-pay

## 2019-02-03 ENCOUNTER — Telehealth: Payer: Self-pay | Admitting: Internal Medicine

## 2019-02-03 DIAGNOSIS — C713 Malignant neoplasm of parietal lobe: Secondary | ICD-10-CM | POA: Diagnosis not present

## 2019-02-03 DIAGNOSIS — Z510 Encounter for antineoplastic radiation therapy: Secondary | ICD-10-CM | POA: Diagnosis not present

## 2019-02-03 NOTE — Telephone Encounter (Signed)
No los per 1/28.

## 2019-02-06 ENCOUNTER — Ambulatory Visit
Admission: RE | Admit: 2019-02-06 | Discharge: 2019-02-06 | Disposition: A | Discharge status: Home / Self Care | Payer: BC Managed Care – PPO | Source: Ambulatory Visit | Attending: Radiation Oncology | Admitting: Radiation Oncology

## 2019-02-06 ENCOUNTER — Other Ambulatory Visit: Payer: Self-pay

## 2019-02-06 DIAGNOSIS — C713 Malignant neoplasm of parietal lobe: Secondary | ICD-10-CM | POA: Diagnosis not present

## 2019-02-06 DIAGNOSIS — Z510 Encounter for antineoplastic radiation therapy: Secondary | ICD-10-CM | POA: Diagnosis not present

## 2019-02-07 ENCOUNTER — Ambulatory Visit
Admission: RE | Admit: 2019-02-07 | Discharge: 2019-02-07 | Disposition: A | Discharge status: Home / Self Care | Payer: BC Managed Care – PPO | Source: Ambulatory Visit | Attending: Radiation Oncology | Admitting: Radiation Oncology

## 2019-02-07 ENCOUNTER — Other Ambulatory Visit: Payer: Self-pay

## 2019-02-07 DIAGNOSIS — Z510 Encounter for antineoplastic radiation therapy: Secondary | ICD-10-CM | POA: Diagnosis not present

## 2019-02-07 DIAGNOSIS — C713 Malignant neoplasm of parietal lobe: Secondary | ICD-10-CM | POA: Diagnosis not present

## 2019-02-08 ENCOUNTER — Ambulatory Visit
Admission: RE | Admit: 2019-02-08 | Discharge: 2019-02-08 | Disposition: A | Discharge status: Home / Self Care | Payer: BC Managed Care – PPO | Source: Ambulatory Visit | Attending: Radiation Oncology | Admitting: Radiation Oncology

## 2019-02-08 ENCOUNTER — Other Ambulatory Visit: Payer: Self-pay

## 2019-02-08 DIAGNOSIS — Z510 Encounter for antineoplastic radiation therapy: Secondary | ICD-10-CM | POA: Diagnosis not present

## 2019-02-08 DIAGNOSIS — C713 Malignant neoplasm of parietal lobe: Secondary | ICD-10-CM | POA: Diagnosis not present

## 2019-02-09 ENCOUNTER — Ambulatory Visit
Admission: RE | Admit: 2019-02-09 | Discharge: 2019-02-09 | Disposition: A | Discharge status: Home / Self Care | Payer: BC Managed Care – PPO | Source: Ambulatory Visit | Attending: Radiation Oncology | Admitting: Radiation Oncology

## 2019-02-09 ENCOUNTER — Other Ambulatory Visit: Payer: Self-pay

## 2019-02-09 DIAGNOSIS — C713 Malignant neoplasm of parietal lobe: Secondary | ICD-10-CM | POA: Diagnosis not present

## 2019-02-09 DIAGNOSIS — Z510 Encounter for antineoplastic radiation therapy: Secondary | ICD-10-CM | POA: Diagnosis not present

## 2019-02-10 ENCOUNTER — Other Ambulatory Visit: Payer: Self-pay

## 2019-02-10 ENCOUNTER — Ambulatory Visit
Admission: RE | Admit: 2019-02-10 | Discharge: 2019-02-10 | Disposition: A | Discharge status: Home / Self Care | Payer: BC Managed Care – PPO | Source: Ambulatory Visit | Attending: Radiation Oncology | Admitting: Radiation Oncology

## 2019-02-10 DIAGNOSIS — C713 Malignant neoplasm of parietal lobe: Secondary | ICD-10-CM | POA: Diagnosis not present

## 2019-02-10 DIAGNOSIS — Z510 Encounter for antineoplastic radiation therapy: Secondary | ICD-10-CM | POA: Diagnosis not present

## 2019-02-13 ENCOUNTER — Other Ambulatory Visit: Payer: Self-pay

## 2019-02-13 ENCOUNTER — Ambulatory Visit
Admission: RE | Admit: 2019-02-13 | Discharge: 2019-02-13 | Disposition: A | Discharge status: Home / Self Care | Payer: BC Managed Care – PPO | Source: Ambulatory Visit | Attending: Radiation Oncology | Admitting: Radiation Oncology

## 2019-02-13 DIAGNOSIS — Z510 Encounter for antineoplastic radiation therapy: Secondary | ICD-10-CM | POA: Diagnosis not present

## 2019-02-13 DIAGNOSIS — C713 Malignant neoplasm of parietal lobe: Secondary | ICD-10-CM | POA: Diagnosis not present

## 2019-02-14 ENCOUNTER — Inpatient Hospital Stay: Payer: BC Managed Care – PPO | Attending: Internal Medicine | Admitting: Internal Medicine

## 2019-02-14 ENCOUNTER — Inpatient Hospital Stay: Payer: BC Managed Care – PPO

## 2019-02-14 ENCOUNTER — Other Ambulatory Visit: Payer: Self-pay

## 2019-02-14 ENCOUNTER — Ambulatory Visit
Admission: RE | Admit: 2019-02-14 | Discharge: 2019-02-14 | Disposition: A | Discharge status: Home / Self Care | Payer: BC Managed Care – PPO | Source: Ambulatory Visit | Attending: Radiation Oncology | Admitting: Radiation Oncology

## 2019-02-14 VITALS — BP 113/81 | HR 107 | Temp 98.5°F | Resp 18 | Ht 62.0 in | Wt 114.0 lb

## 2019-02-14 DIAGNOSIS — C719 Malignant neoplasm of brain, unspecified: Secondary | ICD-10-CM

## 2019-02-14 DIAGNOSIS — C714 Malignant neoplasm of occipital lobe: Secondary | ICD-10-CM | POA: Diagnosis not present

## 2019-02-14 DIAGNOSIS — E7800 Pure hypercholesterolemia, unspecified: Secondary | ICD-10-CM | POA: Diagnosis not present

## 2019-02-14 DIAGNOSIS — Z510 Encounter for antineoplastic radiation therapy: Secondary | ICD-10-CM | POA: Diagnosis not present

## 2019-02-14 DIAGNOSIS — Z7982 Long term (current) use of aspirin: Secondary | ICD-10-CM | POA: Diagnosis not present

## 2019-02-14 DIAGNOSIS — E039 Hypothyroidism, unspecified: Secondary | ICD-10-CM | POA: Diagnosis not present

## 2019-02-14 DIAGNOSIS — Z79899 Other long term (current) drug therapy: Secondary | ICD-10-CM | POA: Diagnosis not present

## 2019-02-14 DIAGNOSIS — C713 Malignant neoplasm of parietal lobe: Secondary | ICD-10-CM | POA: Diagnosis not present

## 2019-02-14 LAB — CBC WITH DIFFERENTIAL (CANCER CENTER ONLY)
Abs Immature Granulocytes: 0.02 10*3/uL (ref 0.00–0.07)
Basophils Absolute: 0 10*3/uL (ref 0.0–0.1)
Basophils Relative: 1 %
Eosinophils Absolute: 0.1 10*3/uL (ref 0.0–0.5)
Eosinophils Relative: 1 %
HCT: 41.9 % (ref 36.0–46.0)
Hemoglobin: 13.7 g/dL (ref 12.0–15.0)
Immature Granulocytes: 0 %
Lymphocytes Relative: 13 %
Lymphs Abs: 0.8 10*3/uL (ref 0.7–4.0)
MCH: 27.8 pg (ref 26.0–34.0)
MCHC: 32.7 g/dL (ref 30.0–36.0)
MCV: 85.2 fL (ref 80.0–100.0)
Monocytes Absolute: 0.5 10*3/uL (ref 0.1–1.0)
Monocytes Relative: 8 %
Neutro Abs: 5.1 10*3/uL (ref 1.7–7.7)
Neutrophils Relative %: 77 %
Platelet Count: 334 10*3/uL (ref 150–400)
RBC: 4.92 MIL/uL (ref 3.87–5.11)
RDW: 12.6 % (ref 11.5–15.5)
WBC Count: 6.5 10*3/uL (ref 4.0–10.5)
nRBC: 0 % (ref 0.0–0.2)

## 2019-02-14 LAB — CMP (CANCER CENTER ONLY)
ALT: 14 U/L (ref 0–44)
AST: 17 U/L (ref 15–41)
Albumin: 3.8 g/dL (ref 3.5–5.0)
Alkaline Phosphatase: 103 U/L (ref 38–126)
Anion gap: 10 (ref 5–15)
BUN: 14 mg/dL (ref 8–23)
CO2: 27 mmol/L (ref 22–32)
Calcium: 9.1 mg/dL (ref 8.9–10.3)
Chloride: 105 mmol/L (ref 98–111)
Creatinine: 1.03 mg/dL — ABNORMAL HIGH (ref 0.44–1.00)
GFR, Est AFR Am: >60 mL/min (ref >60)
GFR, Estimated: 58 mL/min — ABNORMAL LOW (ref >60)
Glucose, Bld: 87 mg/dL (ref 70–99)
Potassium: 4.1 mmol/L (ref 3.5–5.1)
Sodium: 142 mmol/L (ref 135–145)
Total Bilirubin: 0.5 mg/dL (ref 0.3–1.2)
Total Protein: 6.9 g/dL (ref 6.5–8.1)

## 2019-02-14 NOTE — Progress Notes (Signed)
[facility] at [place], [place] [address] [phone]   Interval Evaluation  Date of Service: 02/14/19 Patient Name: Jillian Gomez Patient MRN: [id number] Patient DOB: [birth year] Provider: Ventura Sellers, MD  Identifying Statement:  Jillian Gomez is a 64 y.o. female with left occipital glioblastoma   Oncologic History: Oncology History  Glioblastoma with isocitrate dehydrogenase gene wildtype ([facility])  12/23/2018 Surgery   Craniotomy, left occipital resection by Dr. Kathyrn Sheriff.  Path is GBM IDH-wt   01/17/2019 -  Chemotherapy   The patient had [No matching medication found in this treatment plan]  for chemotherapy treatment.      Biomarkers:  MGMT Unknown.  IDH 1/2 Wild type.  EGFR Unknown  TERT Unknown   Interval History:  Jillian Gomez presents today for follow up, now in week #4 of radiation and concurrent Temodar.  She has been tolerating treatment well without any ill effects thus far.  No new or progressive deficits, no headaches or seizures.  Remains functionally independent.  H+P (01/09/19) Patient presented to medical attention in early December with ~2 months history of right sided visual impairment.  She describes fuzzy or blurry vision on that side which was progressive over time, leading to at least one fall.  MRI brain demonstrated enhancing left occipital mass; this was subsequently resected by Dr. Kathyrn Sheriff on 12/23/18.  She had no issues with surgery and has completed her steroid taper.  Continues on keppra daily but no history of any seizure. She has returned to work with only modest difficutly.  No issues walking or performing ADLs; lives alone but her sister and other family members are nearby.  Medications: Current Outpatient Medications on File Prior to Visit  Medication Sig Dispense Refill  . aspirin 81 MG chewable tablet Chew 1 tablet (81 mg total) by mouth daily.    Marland Kitchen atorvastatin (LIPITOR) 40 MG  tablet Take 40 mg by mouth daily.    . Biotin 1 MG CAPS Take 1 mg by mouth daily.    Marland Kitchen CALCIUM PO Take 1 tablet by mouth daily.    Arna Medici 50 MCG tablet Take 50 mcg by mouth daily.    . naproxen sodium (ALEVE) 220 MG tablet Take 1 tablet (220 mg total) by mouth daily as needed (pain). (Patient not taking: Reported on 02/02/2019)    . ondansetron (ZOFRAN) 8 MG tablet Take 1 tablet (8 mg total) by mouth 2 (two) times daily as needed (nausea and vomiting). May take 30-60 minutes prior to Temodar administration if nausea/vomiting occurs. 30 tablet 1  . temozolomide (TEMODAR) 100 MG capsule Take 1 capsule (100 mg total) by mouth daily. May take on an empty stomach to decrease nausea & vomiting. 42 capsule 0   No current facility-administered medications on file prior to visit.    Allergies:  Allergies  Allergen Reactions  . Etodolac Other (See Comments)    GI upset  . Macrobid [Nitrofurantoin] Rash   Past Medical History:  Past Medical History:  Diagnosis Date  . High cholesterol   . Hypothyroidism   . Joint pain    Past Surgical History:  Past Surgical History:  Procedure Laterality Date  . APPLICATION OF CRANIAL NAVIGATION Left 12/23/2018   Procedure: APPLICATION OF CRANIAL NAVIGATION;  Surgeon: Consuella Lose, MD;  Location: [facility];  Service: Neurosurgery;  Laterality: Left;  APPLICATION OF CRANIAL NAVIGATION  . BREAST BIOPSY Left   . COLONOSCOPY    . CRANIOTOMY  Left 12/23/2018   Procedure: STEREOTACTIC LEFT OCCIPITAL CRANIOTOMY FOR RESECTION OF TUMOR;  Surgeon: Consuella Lose, MD;  Location: [facility];  Service: Neurosurgery;  Laterality: Left;  STEREOTACTIC LEFT OCCIPITAL CRANIOTOMY FOR RESECTION OF TUMOR  . LASIK     Social History:  Social History   Socioeconomic History  . Marital status: Single    Spouse name: Not on file  . Number of children: Not on file  . Years of education: Not on file  . Highest education level: Not on file  Occupational History  . Not on  file  Tobacco Use  . Smoking status: Never Smoker  . Smokeless tobacco: Never Used  Substance and Sexual Activity  . Alcohol use: Not Currently  . Drug use: Never  . Sexual activity: Not on file  Other Topics Concern  . Not on file  Social History Narrative  . Not on file   Social Determinants of Health   Financial Resource Strain:   . Difficulty of Paying Living Expenses: Not on file  Food Insecurity:   . Worried About Charity fundraiser in the Last Year: Not on file  . Ran Out of Food in the Last Year: Not on file  Transportation Needs:   . Lack of Transportation (Medical): Not on file  . Lack of Transportation (Non-Medical): Not on file  Physical Activity:   . Days of Exercise per Week: Not on file  . Minutes of Exercise per Session: Not on file  Stress:   . Feeling of Stress : Not on file  Social Connections:   . Frequency of Communication with Friends and Family: Not on file  . Frequency of Social Gatherings with Friends and Family: Not on file  . Attends Religious Services: Not on file  . Active Member of Clubs or Organizations: Not on file  . Attends Archivist Meetings: Not on file  . Marital Status: Not on file  Intimate Partner Violence:   . Fear of Current or Ex-Partner: Not on file  . Emotionally Abused: Not on file  . Physically Abused: Not on file  . Sexually Abused: Not on file   Family History: No family history on file.  Review of Systems: Constitutional: Denies fevers, chills or abnormal weight loss Eyes: Denies blurriness of vision Gastrointestinal:  Denies nausea, constipation, diarrhea GU: Denies dysuria or incontinence Skin: Denies abnormal skin rashes Musculoskeletal: Denies joint pain, back or neck discomfort.  Behavioral/Psych: Denies anxiety, mood instability  Physical Exam: Vitals:   02/14/19 1014  BP: 113/81  Pulse: (!) 107  Resp: 18  Temp: 98.5 F (36.9 C)  SpO2: 100%   KPS: 80. General: Alert, cooperative,  pleasant, in no acute distress Head: Craniotomy scar noted, dry and intact. EENT: No conjunctival injection or scleral icterus. Oral mucosa moist Lungs: Resp effort normal Cardiac: Regular rate and rhythm Abdomen: Soft, non-distended abdomen Skin: No rashes cyanosis or petechiae. Extremities: No clubbing or edema  Neurologic Exam: Mental Status: Awake, alert, attentive to examiner. Oriented to self and environment. Language is fluent with intact comprehension.  Cranial Nerves: Visual acuity is grossly normal. Right homonymous hemianopia. Extra-ocular movements intact. No ptosis. Face is symmetric, tongue midline. Motor: Tone and bulk are normal. Power is full in both arms and legs. Reflexes are symmetric, no pathologic reflexes present. Intact finger to nose bilaterally Sensory: Intact to light touch and temperature Gait: Normal and tandem gait is deferred.   Labs: I have reviewed the data as listed  Component Value Date/Time   NA 142 02/02/2019 0931   K 4.4 02/02/2019 0931   CL 104 02/02/2019 0931   CO2 28 02/02/2019 0931   GLUCOSE 94 02/02/2019 0931   BUN 17 02/02/2019 0931   CREATININE 1.04 (H) 02/02/2019 0931   CALCIUM 9.6 02/02/2019 0931   PROT 7.1 02/02/2019 0931   ALBUMIN 4.0 02/02/2019 0931   AST 16 02/02/2019 0931   ALT 12 02/02/2019 0931   ALKPHOS 103 02/02/2019 0931   BILITOT 0.4 02/02/2019 0931   GFRNONAA 57 (L) 02/02/2019 0931   GFRAA >60 02/02/2019 0931   Lab Results  Component Value Date   WBC 6.5 02/14/2019   NEUTROABS 5.1 02/14/2019   HGB 13.7 02/14/2019   HCT 41.9 02/14/2019   MCV 85.2 02/14/2019   PLT 334 02/14/2019     Assessment/Plan Glioblastoma with isocitrate dehydrogenase gene wildtype ([facility]) [C71.9]   Jillian Gomez is clinically stable, now in week 4/6 of IMRT and concurrent Temodar.  Labs are within normal limits.  We recommended continuing treatment with 6 weeks course of intensity modulated radiation therapy and concurrent  temozolomide dosed at 26m/m2 daily.    Chemotherapy should be held for the following:  ANC less than 1,000  Platelets less than 100,000  LFT or creatinine greater than 2x ULN  If clinical concerns/contraindications develop  She should return to clinic in 2 weeks with labs for evaluation.  All questions were answered. The patient knows to call the clinic with any problems, questions or concerns. No barriers to learning were detected.  The total time spent in the encounter was 25 minutes and more than 50% was on counseling and review of test results   ZVentura Sellers MD Medical Director of Neuro-Oncology [facility] [place]02/09/21 10:05 AM

## 2019-02-15 ENCOUNTER — Other Ambulatory Visit: Payer: Self-pay

## 2019-02-15 ENCOUNTER — Telehealth: Payer: Self-pay

## 2019-02-15 ENCOUNTER — Ambulatory Visit
Admission: RE | Admit: 2019-02-15 | Discharge: 2019-02-15 | Disposition: A | Discharge status: Home / Self Care | Payer: BC Managed Care – PPO | Source: Ambulatory Visit | Attending: Radiation Oncology | Admitting: Radiation Oncology

## 2019-02-15 DIAGNOSIS — C713 Malignant neoplasm of parietal lobe: Secondary | ICD-10-CM | POA: Diagnosis not present

## 2019-02-15 DIAGNOSIS — Z510 Encounter for antineoplastic radiation therapy: Secondary | ICD-10-CM | POA: Diagnosis not present

## 2019-02-15 MED FILL — TEMOZOLOMIDE 100 MG CAPS: 100 | 12 days supply | Qty: 12 | Fill #1

## 2019-02-15 MED FILL — ONDANSETRON HCL 8 MG TABLET: 8 | 15 days supply | Qty: 30 | Fill #1

## 2019-02-15 NOTE — Telephone Encounter (Signed)
Nutrition Assessment   Reason for Assessment:   Patient identified on Malnutrition Screening report for weight loss and poor appetite   ASSESSMENT:  64 year old female with left occipital glioblastoma currently receiving radiation and on temodar.  Patient followed by Dr. Mickeal Skinner and Dr Isidore Moos.   Spoke with patient via phone to introduce self and service at [facility].  Patient reports that she drinks a couple of boost shakes (high protein) per day and maybe eats a piece of toast.  Reports that anxiety effects her appetite.  "I think I am doing really good though. I have not missed a day of work."    Medications: zofran   Labs: creatinine 1.03   Anthropometrics:   Height: 62 inches Weight: 114 lb on 2/9  118 lb noted on 12/23/2018 BMI: 20 3% weight loss in the last 2 months   Estimated Energy Needs  Kcals: 1560-1820 Protein: 78-91 g Fluid: > 1.5 L   NUTRITION DIAGNOSIS: Inadequate oral intake related to cancer/anxiety as evidenced by 3% weight loss and poor appetite    INTERVENTION:  Discussed importance of nutrition and weight maintenance during treatment Encouraged patient trying higher calorie boost shake and examples provided.  Discussed ways to add calories to current shake. Encouraged small frequent meals including protein.  Provided patient with contact information and patient prefers to reach out to RD if needed in the future.   Next Visit: no follow-up, patient to call RD if needed  Jovian Lembcke B. Zenia Resides, [place], [place] Registered Dietitian [phone] (pager)

## 2019-02-16 ENCOUNTER — Other Ambulatory Visit: Payer: Self-pay

## 2019-02-16 ENCOUNTER — Ambulatory Visit
Admission: RE | Admit: 2019-02-16 | Discharge: 2019-02-16 | Disposition: A | Discharge status: Home / Self Care | Payer: BC Managed Care – PPO | Source: Ambulatory Visit | Attending: Radiation Oncology | Admitting: Radiation Oncology

## 2019-02-16 DIAGNOSIS — Z510 Encounter for antineoplastic radiation therapy: Secondary | ICD-10-CM | POA: Diagnosis not present

## 2019-02-16 DIAGNOSIS — C713 Malignant neoplasm of parietal lobe: Secondary | ICD-10-CM | POA: Diagnosis not present

## 2019-02-17 ENCOUNTER — Other Ambulatory Visit: Payer: Self-pay

## 2019-02-17 ENCOUNTER — Ambulatory Visit
Admission: RE | Admit: 2019-02-17 | Discharge: 2019-02-17 | Disposition: A | Discharge status: Home / Self Care | Payer: BC Managed Care – PPO | Source: Ambulatory Visit | Attending: Radiation Oncology | Admitting: Radiation Oncology

## 2019-02-17 DIAGNOSIS — Z510 Encounter for antineoplastic radiation therapy: Secondary | ICD-10-CM | POA: Diagnosis not present

## 2019-02-17 DIAGNOSIS — C713 Malignant neoplasm of parietal lobe: Secondary | ICD-10-CM | POA: Diagnosis not present

## 2019-02-20 ENCOUNTER — Ambulatory Visit
Admission: RE | Admit: 2019-02-20 | Discharge: 2019-02-20 | Disposition: A | Discharge status: Home / Self Care | Payer: BC Managed Care – PPO | Source: Ambulatory Visit | Attending: Radiation Oncology | Admitting: Radiation Oncology

## 2019-02-20 ENCOUNTER — Telehealth: Payer: Self-pay | Admitting: *Deleted

## 2019-02-20 ENCOUNTER — Other Ambulatory Visit: Payer: Self-pay

## 2019-02-20 DIAGNOSIS — Z510 Encounter for antineoplastic radiation therapy: Secondary | ICD-10-CM | POA: Diagnosis not present

## 2019-02-20 DIAGNOSIS — C713 Malignant neoplasm of parietal lobe: Secondary | ICD-10-CM

## 2019-02-20 NOTE — Telephone Encounter (Signed)
CALLED PATIENT TO INFORM OF DOPPLER FOR 02-21-19 - ARRIVAL TIME- 9:45 AM @ [facility] ADMITTING FOR DOPPLER @ 10 AM @ [facility], SPOKE WITH PATIENT AND SHE IS AWARE OF THIS TEST

## 2019-02-21 ENCOUNTER — Ambulatory Visit
Admission: RE | Admit: 2019-02-21 | Discharge: 2019-02-21 | Disposition: A | Discharge status: Home / Self Care | Payer: BC Managed Care – PPO | Source: Ambulatory Visit | Attending: Radiation Oncology | Admitting: Radiation Oncology

## 2019-02-21 ENCOUNTER — Ambulatory Visit (HOSPITAL_COMMUNITY)
Admission: RE | Admit: 2019-02-21 | Discharge: 2019-02-21 | Disposition: A | Discharge status: Home / Self Care | Payer: BC Managed Care – PPO | Source: Ambulatory Visit | Attending: Radiation Oncology | Admitting: Radiation Oncology

## 2019-02-21 ENCOUNTER — Other Ambulatory Visit: Payer: Self-pay

## 2019-02-21 DIAGNOSIS — Z510 Encounter for antineoplastic radiation therapy: Secondary | ICD-10-CM | POA: Diagnosis not present

## 2019-02-21 DIAGNOSIS — C713 Malignant neoplasm of parietal lobe: Secondary | ICD-10-CM | POA: Diagnosis not present

## 2019-02-21 NOTE — Progress Notes (Signed)
Bilateral lower extremity venous duplex completed. Refer to "CV Proc" under chart review to view preliminary results.  02/21/2019 10:22 AM Kelby Aline., MHA, RVT, RDCS, RDMS

## 2019-02-22 ENCOUNTER — Ambulatory Visit
Admission: RE | Admit: 2019-02-22 | Discharge: 2019-02-22 | Disposition: A | Discharge status: Home / Self Care | Payer: BC Managed Care – PPO | Source: Ambulatory Visit | Attending: Radiation Oncology | Admitting: Radiation Oncology

## 2019-02-22 ENCOUNTER — Other Ambulatory Visit: Payer: Self-pay

## 2019-02-22 DIAGNOSIS — Z510 Encounter for antineoplastic radiation therapy: Secondary | ICD-10-CM | POA: Diagnosis not present

## 2019-02-22 DIAGNOSIS — C713 Malignant neoplasm of parietal lobe: Secondary | ICD-10-CM | POA: Diagnosis not present

## 2019-02-23 ENCOUNTER — Other Ambulatory Visit: Payer: Self-pay

## 2019-02-23 ENCOUNTER — Ambulatory Visit
Admission: RE | Admit: 2019-02-23 | Discharge: 2019-02-23 | Disposition: A | Discharge status: Home / Self Care | Payer: BC Managed Care – PPO | Source: Ambulatory Visit | Attending: Radiation Oncology | Admitting: Radiation Oncology

## 2019-02-23 DIAGNOSIS — C713 Malignant neoplasm of parietal lobe: Secondary | ICD-10-CM | POA: Diagnosis not present

## 2019-02-23 DIAGNOSIS — Z510 Encounter for antineoplastic radiation therapy: Secondary | ICD-10-CM | POA: Diagnosis not present

## 2019-02-24 ENCOUNTER — Ambulatory Visit
Admission: RE | Admit: 2019-02-24 | Discharge: 2019-02-24 | Disposition: A | Discharge status: Home / Self Care | Payer: BC Managed Care – PPO | Source: Ambulatory Visit | Attending: Radiation Oncology | Admitting: Radiation Oncology

## 2019-02-24 ENCOUNTER — Other Ambulatory Visit: Payer: Self-pay

## 2019-02-24 DIAGNOSIS — C713 Malignant neoplasm of parietal lobe: Secondary | ICD-10-CM | POA: Diagnosis not present

## 2019-02-24 DIAGNOSIS — Z510 Encounter for antineoplastic radiation therapy: Secondary | ICD-10-CM | POA: Diagnosis not present

## 2019-02-27 ENCOUNTER — Other Ambulatory Visit: Payer: Self-pay

## 2019-02-27 ENCOUNTER — Ambulatory Visit
Admission: RE | Admit: 2019-02-27 | Discharge: 2019-02-27 | Disposition: A | Discharge status: Home / Self Care | Payer: BC Managed Care – PPO | Source: Ambulatory Visit | Attending: Radiation Oncology | Admitting: Radiation Oncology

## 2019-02-27 DIAGNOSIS — C713 Malignant neoplasm of parietal lobe: Secondary | ICD-10-CM | POA: Diagnosis not present

## 2019-02-27 DIAGNOSIS — Z510 Encounter for antineoplastic radiation therapy: Secondary | ICD-10-CM | POA: Diagnosis not present

## 2019-02-28 ENCOUNTER — Other Ambulatory Visit: Payer: Self-pay

## 2019-02-28 ENCOUNTER — Ambulatory Visit
Admission: RE | Admit: 2019-02-28 | Discharge: 2019-02-28 | Disposition: A | Discharge status: Home / Self Care | Payer: BC Managed Care – PPO | Source: Ambulatory Visit | Attending: Radiation Oncology | Admitting: Radiation Oncology

## 2019-02-28 DIAGNOSIS — Z510 Encounter for antineoplastic radiation therapy: Secondary | ICD-10-CM | POA: Diagnosis not present

## 2019-02-28 DIAGNOSIS — C713 Malignant neoplasm of parietal lobe: Secondary | ICD-10-CM | POA: Diagnosis not present

## 2019-03-01 ENCOUNTER — Other Ambulatory Visit: Payer: Self-pay

## 2019-03-01 ENCOUNTER — Ambulatory Visit
Admission: RE | Admit: 2019-03-01 | Discharge: 2019-03-01 | Disposition: A | Discharge status: Home / Self Care | Payer: BC Managed Care – PPO | Source: Ambulatory Visit | Attending: Radiation Oncology | Admitting: Radiation Oncology

## 2019-03-01 DIAGNOSIS — Z510 Encounter for antineoplastic radiation therapy: Secondary | ICD-10-CM | POA: Diagnosis not present

## 2019-03-01 DIAGNOSIS — C713 Malignant neoplasm of parietal lobe: Secondary | ICD-10-CM | POA: Diagnosis not present

## 2019-03-02 ENCOUNTER — Inpatient Hospital Stay: Payer: BC Managed Care – PPO

## 2019-03-02 ENCOUNTER — Inpatient Hospital Stay (HOSPITAL_BASED_OUTPATIENT_CLINIC_OR_DEPARTMENT_OTHER): Payer: BC Managed Care – PPO | Admitting: Internal Medicine

## 2019-03-02 ENCOUNTER — Encounter: Payer: Self-pay | Admitting: Internal Medicine

## 2019-03-02 ENCOUNTER — Other Ambulatory Visit: Payer: Self-pay

## 2019-03-02 ENCOUNTER — Ambulatory Visit
Admission: RE | Admit: 2019-03-02 | Discharge: 2019-03-02 | Disposition: A | Discharge status: Home / Self Care | Payer: BC Managed Care – PPO | Source: Ambulatory Visit | Attending: Radiation Oncology | Admitting: Radiation Oncology

## 2019-03-02 VITALS — BP 118/79 | HR 80 | Temp 98.7°F | Resp 16 | Ht 62.0 in | Wt 112.2 lb

## 2019-03-02 DIAGNOSIS — C714 Malignant neoplasm of occipital lobe: Secondary | ICD-10-CM | POA: Diagnosis not present

## 2019-03-02 DIAGNOSIS — E7800 Pure hypercholesterolemia, unspecified: Secondary | ICD-10-CM | POA: Diagnosis not present

## 2019-03-02 DIAGNOSIS — Z510 Encounter for antineoplastic radiation therapy: Secondary | ICD-10-CM | POA: Diagnosis not present

## 2019-03-02 DIAGNOSIS — C719 Malignant neoplasm of brain, unspecified: Secondary | ICD-10-CM

## 2019-03-02 DIAGNOSIS — C713 Malignant neoplasm of parietal lobe: Secondary | ICD-10-CM | POA: Diagnosis not present

## 2019-03-02 DIAGNOSIS — Z79899 Other long term (current) drug therapy: Secondary | ICD-10-CM | POA: Diagnosis not present

## 2019-03-02 DIAGNOSIS — E039 Hypothyroidism, unspecified: Secondary | ICD-10-CM | POA: Diagnosis not present

## 2019-03-02 DIAGNOSIS — Z7982 Long term (current) use of aspirin: Secondary | ICD-10-CM | POA: Diagnosis not present

## 2019-03-02 LAB — CBC WITH DIFFERENTIAL (CANCER CENTER ONLY)
Abs Immature Granulocytes: 0.01 10*3/uL (ref 0.00–0.07)
Basophils Absolute: 0.1 10*3/uL (ref 0.0–0.1)
Basophils Relative: 1 %
Eosinophils Absolute: 0.4 10*3/uL (ref 0.0–0.5)
Eosinophils Relative: 6 %
HCT: 41.7 % (ref 36.0–46.0)
Hemoglobin: 13.5 g/dL (ref 12.0–15.0)
Immature Granulocytes: 0 %
Lymphocytes Relative: 21 %
Lymphs Abs: 1.3 10*3/uL (ref 0.7–4.0)
MCH: 28 pg (ref 26.0–34.0)
MCHC: 32.4 g/dL (ref 30.0–36.0)
MCV: 86.3 fL (ref 80.0–100.0)
Monocytes Absolute: 0.6 10*3/uL (ref 0.1–1.0)
Monocytes Relative: 9 %
Neutro Abs: 3.9 10*3/uL (ref 1.7–7.7)
Neutrophils Relative %: 63 %
Platelet Count: 298 10*3/uL (ref 150–400)
RBC: 4.83 MIL/uL (ref 3.87–5.11)
RDW: 12.9 % (ref 11.5–15.5)
WBC Count: 6.1 10*3/uL (ref 4.0–10.5)
nRBC: 0 % (ref 0.0–0.2)

## 2019-03-02 LAB — CMP (CANCER CENTER ONLY)
ALT: 12 U/L (ref 0–44)
AST: 18 U/L (ref 15–41)
Albumin: 3.8 g/dL (ref 3.5–5.0)
Alkaline Phosphatase: 96 U/L (ref 38–126)
Anion gap: 10 (ref 5–15)
BUN: 16 mg/dL (ref 8–23)
CO2: 27 mmol/L (ref 22–32)
Calcium: 9.1 mg/dL (ref 8.9–10.3)
Chloride: 107 mmol/L (ref 98–111)
Creatinine: 0.99 mg/dL (ref 0.44–1.00)
GFR, Est AFR Am: >60 mL/min (ref >60)
GFR, Estimated: >60 mL/min (ref >60)
Glucose, Bld: 98 mg/dL (ref 70–99)
Potassium: 4.2 mmol/L (ref 3.5–5.1)
Sodium: 144 mmol/L (ref 135–145)
Total Bilirubin: 0.5 mg/dL (ref 0.3–1.2)
Total Protein: 7 g/dL (ref 6.5–8.1)

## 2019-03-02 NOTE — Progress Notes (Signed)
[facility] [place],  [address] [phone]   Interval Evaluation  Date of Service: 03/02/19 Patient Name: Jillian Gomez Patient MRN: [id number] Patient DOB: [birth year] Provider: Ventura Sellers, MD  Identifying Statement:  Jillian Gomez is a 64 y.o. female with left occipital glioblastoma   Oncologic History: Oncology History  Glioblastoma with isocitrate dehydrogenase gene wildtype ([place])  12/23/2018 Surgery   Craniotomy, left occipital resection by Dr. Kathyrn Sheriff.  Path is GBM IDH-wt   01/17/2019 -  Chemotherapy   The patient had [No matching medication found in this treatment plan]  for chemotherapy treatment.      Biomarkers:  MGMT Unknown.  IDH 1/2 Wild type.  EGFR Unknown  TERT Unknown   Interval History:  Jillian Gomez presents today for follow up, now in week #6 of radiation and concurrent Temodar.  She has been tolerating treatment well without any ill effects thus far.  No new or progressive deficits, no headaches or seizures.  Remains functionally independent.  H+P (01/09/19) Patient presented to medical attention in early December with ~2 months history of right sided visual impairment.  She describes fuzzy or blurry vision on that side which was progressive over time, leading to at least one fall.  MRI brain demonstrated enhancing left occipital mass; this was subsequently resected by Dr. Kathyrn Sheriff on 12/23/18.  She had no issues with surgery and has completed her steroid taper.  Continues on keppra daily but no history of any seizure. She has returned to work with only modest difficutly.  No issues walking or performing ADLs; lives alone but her sister and other family members are nearby.  Medications: Current Outpatient Medications on File Prior to Visit  Medication Sig Dispense Refill  . aspirin 81 MG chewable tablet Chew 1 tablet (81 mg total) by mouth daily.    Marland Kitchen atorvastatin (LIPITOR) 40 MG  tablet Take 40 mg by mouth daily.    . Biotin 1 MG CAPS Take 1 mg by mouth daily.    Marland Kitchen CALCIUM PO Take 1 tablet by mouth daily.    Arna Medici 50 MCG tablet Take 50 mcg by mouth daily.    . naproxen sodium (ALEVE) 220 MG tablet Take 1 tablet (220 mg total) by mouth daily as needed (pain). (Patient not taking: Reported on 02/02/2019)    . ondansetron (ZOFRAN) 8 MG tablet Take 1 tablet (8 mg total) by mouth 2 (two) times daily as needed (nausea and vomiting). May take 30-60 minutes prior to Temodar administration if nausea/vomiting occurs. 30 tablet 1  . temozolomide (TEMODAR) 100 MG capsule Take 1 capsule (100 mg total) by mouth daily. May take on an empty stomach to decrease nausea & vomiting. 42 capsule 0   No current facility-administered medications on file prior to visit.    Allergies:  Allergies  Allergen Reactions  . Etodolac Other (See Comments)    GI upset  . Macrobid [Nitrofurantoin] Rash   Past Medical History:  Past Medical History:  Diagnosis Date  . High cholesterol   . Hypothyroidism   . Joint pain    Past Surgical History:  Past Surgical History:  Procedure Laterality Date  . APPLICATION OF CRANIAL NAVIGATION Left 12/23/2018   Procedure: APPLICATION OF CRANIAL NAVIGATION;  Surgeon: Consuella Lose, MD;  Location: [facility];  Service: Neurosurgery;  Laterality: Left;  APPLICATION OF CRANIAL NAVIGATION  . BREAST BIOPSY Left   . COLONOSCOPY    . CRANIOTOMY  Left 12/23/2018   Procedure: STEREOTACTIC LEFT OCCIPITAL CRANIOTOMY FOR RESECTION OF TUMOR;  Surgeon: Consuella Lose, MD;  Location: [facility];  Service: Neurosurgery;  Laterality: Left;  STEREOTACTIC LEFT OCCIPITAL CRANIOTOMY FOR RESECTION OF TUMOR  . LASIK     Social History:  Social History   Socioeconomic History  . Marital status: Single    Spouse name: Not on file  . Number of children: Not on file  . Years of education: Not on file  . Highest education level: Not on file  Occupational History  . Not on  file  Tobacco Use  . Smoking status: Never Smoker  . Smokeless tobacco: Never Used  Substance and Sexual Activity  . Alcohol use: Not Currently  . Drug use: Never  . Sexual activity: Not on file  Other Topics Concern  . Not on file  Social History Narrative  . Not on file   Social Determinants of Health   Financial Resource Strain:   . Difficulty of Paying Living Expenses: Not on file  Food Insecurity:   . Worried About Charity fundraiser in the Last Year: Not on file  . Ran Out of Food in the Last Year: Not on file  Transportation Needs:   . Lack of Transportation (Medical): Not on file  . Lack of Transportation (Non-Medical): Not on file  Physical Activity:   . Days of Exercise per Week: Not on file  . Minutes of Exercise per Session: Not on file  Stress:   . Feeling of Stress : Not on file  Social Connections:   . Frequency of Communication with Friends and Family: Not on file  . Frequency of Social Gatherings with Friends and Family: Not on file  . Attends Religious Services: Not on file  . Active Member of Clubs or Organizations: Not on file  . Attends Archivist Meetings: Not on file  . Marital Status: Not on file  Intimate Partner Violence:   . Fear of Current or Ex-Partner: Not on file  . Emotionally Abused: Not on file  . Physically Abused: Not on file  . Sexually Abused: Not on file   Family History: No family history on file.  Review of Systems: Constitutional: Denies fevers, chills or abnormal weight loss Eyes: Denies blurriness of vision Gastrointestinal:  Denies nausea, constipation, diarrhea GU: Denies dysuria or incontinence Skin: Denies abnormal skin rashes Musculoskeletal: Denies joint pain, back or neck discomfort.  Behavioral/Psych: Denies anxiety, mood instability  Physical Exam: There were no vitals filed for this visit. KPS: 80. General: Alert, cooperative, pleasant, in no acute distress Head: Craniotomy scar noted, dry and  intact. EENT: No conjunctival injection or scleral icterus. Oral mucosa moist Lungs: Resp effort normal Cardiac: Regular rate and rhythm Abdomen: Soft, non-distended abdomen Skin: No rashes cyanosis or petechiae. Extremities: No clubbing or edema  Neurologic Exam: Mental Status: Awake, alert, attentive to examiner. Oriented to self and environment. Language is fluent with intact comprehension.  Cranial Nerves: Visual acuity is grossly normal. Right homonymous hemianopia. Extra-ocular movements intact. No ptosis. Face is symmetric, tongue midline. Motor: Tone and bulk are normal. Power is full in both arms and legs. Reflexes are symmetric, no pathologic reflexes present. Intact finger to nose bilaterally Sensory: Intact to light touch and temperature Gait: Normal and tandem gait is deferred.   Labs: I have reviewed the data as listed    Component Value Date/Time   NA 142 02/14/2019 0945   K 4.1 02/14/2019 0945   CL  105 02/14/2019 0945   CO2 27 02/14/2019 0945   GLUCOSE 87 02/14/2019 0945   BUN 14 02/14/2019 0945   CREATININE 1.03 (H) 02/14/2019 0945   CALCIUM 9.1 02/14/2019 0945   PROT 6.9 02/14/2019 0945   ALBUMIN 3.8 02/14/2019 0945   AST 17 02/14/2019 0945   ALT 14 02/14/2019 0945   ALKPHOS 103 02/14/2019 0945   BILITOT 0.5 02/14/2019 0945   GFRNONAA 58 (L) 02/14/2019 0945   GFRAA >60 02/14/2019 0945   Lab Results  Component Value Date   WBC 6.5 02/14/2019   NEUTROABS 5.1 02/14/2019   HGB 13.7 02/14/2019   HCT 41.9 02/14/2019   MCV 85.2 02/14/2019   PLT 334 02/14/2019     Assessment/Plan Glioblastoma with isocitrate dehydrogenase gene wildtype ([facility]) [C71.9]   SELENY ALLBRIGHT is clinically stable, now in final week of IMRT and concurrent Temodar.  Labs are within normal limits.  We recommended completing 6 weeks course of intensity modulated radiation therapy and concurrent temozolomide dosed at 67m/m2 daily.    Chemotherapy should be held for the following:   ANC less than 1,000  Platelets less than 100,000  LFT or creatinine greater than 2x ULN  If clinical concerns/contraindications develop  She should return to clinic in 3-4 weeks with post-RT MRI brain for evaluation.  All questions were answered. The patient knows to call the clinic with any problems, questions or concerns. No barriers to learning were detected.  The total time spent in the encounter was 25 minutes and more than 50% was on counseling and review of test results   ZVentura Sellers MD Medical Director of Neuro-Oncology [facility]02/25/21 10:28 AM

## 2019-03-02 NOTE — Progress Notes (Signed)
Met with patient in lobby to introduce myself as Arboriculturist and to offer available resources.  Discussed one-time $1000 [employer] grant to assist with personal expenses while going through treatment. She states she is ok and has really good insurance. No needs at this time.  Gave her my card if interested in applying and for any additional financial questions or concerns.

## 2019-03-03 ENCOUNTER — Telehealth: Payer: Self-pay | Admitting: Internal Medicine

## 2019-03-03 ENCOUNTER — Ambulatory Visit
Admission: RE | Admit: 2019-03-03 | Discharge: 2019-03-03 | Disposition: A | Discharge status: Home / Self Care | Payer: BC Managed Care – PPO | Source: Ambulatory Visit | Attending: Radiation Oncology | Admitting: Radiation Oncology

## 2019-03-03 ENCOUNTER — Encounter: Payer: Self-pay | Admitting: Radiation Oncology

## 2019-03-03 ENCOUNTER — Other Ambulatory Visit: Payer: Self-pay

## 2019-03-03 DIAGNOSIS — Z510 Encounter for antineoplastic radiation therapy: Secondary | ICD-10-CM | POA: Diagnosis not present

## 2019-03-03 DIAGNOSIS — C713 Malignant neoplasm of parietal lobe: Secondary | ICD-10-CM | POA: Diagnosis not present

## 2019-03-03 NOTE — Telephone Encounter (Signed)
Scheduled appt per 2/25 los.  Left a vm of the appt date and time. 

## 2019-03-08 DIAGNOSIS — H53461 Homonymous bilateral field defects, right side: Secondary | ICD-10-CM | POA: Diagnosis not present

## 2019-03-08 DIAGNOSIS — C719 Malignant neoplasm of brain, unspecified: Secondary | ICD-10-CM | POA: Diagnosis not present

## 2019-03-08 DIAGNOSIS — H524 Presbyopia: Secondary | ICD-10-CM | POA: Diagnosis not present

## 2019-03-08 DIAGNOSIS — H2512 Age-related nuclear cataract, left eye: Secondary | ICD-10-CM | POA: Diagnosis not present

## 2019-03-08 DIAGNOSIS — H52202 Unspecified astigmatism, left eye: Secondary | ICD-10-CM | POA: Diagnosis not present

## 2019-03-08 DIAGNOSIS — H25811 Combined forms of age-related cataract, right eye: Secondary | ICD-10-CM | POA: Diagnosis not present

## 2019-03-08 DIAGNOSIS — H5203 Hypermetropia, bilateral: Secondary | ICD-10-CM | POA: Diagnosis not present

## 2019-03-11 ENCOUNTER — Other Ambulatory Visit: Payer: Self-pay | Admitting: Internal Medicine

## 2019-03-11 MED ORDER — DEXAMETHASONE 4 MG PO TABS: 4.0000 mg | ORAL_TABLET | Freq: Every day | ORAL | 0 refills | Status: DC

## 2019-03-14 ENCOUNTER — Other Ambulatory Visit: Payer: Self-pay | Admitting: Radiation Therapy

## 2019-03-16 ENCOUNTER — Inpatient Hospital Stay: Payer: BC Managed Care – PPO | Attending: Internal Medicine | Admitting: Internal Medicine

## 2019-03-16 ENCOUNTER — Other Ambulatory Visit: Payer: Self-pay

## 2019-03-16 VITALS — BP 129/80 | HR 80 | Temp 98.5°F | Resp 16 | Ht 62.0 in | Wt 110.1 lb

## 2019-03-16 DIAGNOSIS — C714 Malignant neoplasm of occipital lobe: Secondary | ICD-10-CM | POA: Diagnosis not present

## 2019-03-16 DIAGNOSIS — E7800 Pure hypercholesterolemia, unspecified: Secondary | ICD-10-CM | POA: Diagnosis not present

## 2019-03-16 DIAGNOSIS — C719 Malignant neoplasm of brain, unspecified: Secondary | ICD-10-CM

## 2019-03-16 DIAGNOSIS — Z7982 Long term (current) use of aspirin: Secondary | ICD-10-CM | POA: Diagnosis not present

## 2019-03-16 DIAGNOSIS — Z79899 Other long term (current) drug therapy: Secondary | ICD-10-CM | POA: Insufficient documentation

## 2019-03-16 DIAGNOSIS — E039 Hypothyroidism, unspecified: Secondary | ICD-10-CM | POA: Insufficient documentation

## 2019-03-16 DIAGNOSIS — Z7952 Long term (current) use of systemic steroids: Secondary | ICD-10-CM | POA: Insufficient documentation

## 2019-03-16 NOTE — Progress Notes (Signed)
[facility] [address] [place], [place] [address] [phone]   Interval Evaluation  Date of Service: 03/16/19 Patient Name: Jillian Gomez Patient MRN: [id number] Patient DOB: 10/13/1955 Provider: Ventura Sellers, MD  Identifying Statement:  Jillian Gomez is a 64 y.o. female with left occipital glioblastoma   Oncologic History: Oncology History  Glioblastoma with isocitrate dehydrogenase gene wildtype ([facility])  12/23/2018 Surgery   Craniotomy, left occipital resection by Dr. Kathyrn Sheriff.  Path is GBM IDH-wt   01/17/2019 -  Chemotherapy   The patient had [No matching medication found in this treatment plan]  for chemotherapy treatment.      Biomarkers:  MGMT Unknown.  IDH 1/2 Wild type.  EGFR Unknown  TERT Unknown   Interval History:  Jillian Gomez presents today for follow up after recent clinical changes.  She described decline on energy, and increase in confusion and disorientation last week.  Was started on 57m daily decadron on 3/6 which has led to complete return to baseline.  No further new or progressive deficits, no headaches or seizures.  Remains functionally independent as prior.  H+P (01/09/19) Patient presented to medical attention in early December with ~2 months history of right sided visual impairment.  She describes fuzzy or blurry vision on that side which was progressive over time, leading to at least one fall.  MRI brain demonstrated enhancing left occipital mass; this was subsequently resected by Dr. NKathyrn Sheriffon 12/23/18.  She had no issues with surgery and has completed her steroid taper.  Continues on keppra daily but no history of any seizure. She has returned to work with only modest difficutly.  No issues walking or performing ADLs; lives alone but her sister and other family members are nearby.  Medications: Current Outpatient Medications on File Prior to Visit  Medication Sig Dispense Refill  . aspirin 81 MG  chewable tablet Chew 1 tablet (81 mg total) by mouth daily.    .Marland Kitchenatorvastatin (LIPITOR) 40 MG tablet Take 40 mg by mouth daily.    . Biotin 1 MG CAPS Take 1 mg by mouth daily.    .Marland KitchenCALCIUM PO Take 1 tablet by mouth daily.    .Marland Kitchendexamethasone (DECADRON) 4 MG tablet Take 1 tablet (4 mg total) by mouth daily. 30 tablet 0  . EUTHYROX 50 MCG tablet Take 50 mcg by mouth daily.    . ondansetron (ZOFRAN) 8 MG tablet Take 1 tablet (8 mg total) by mouth 2 (two) times daily as needed (nausea and vomiting). May take 30-60 minutes prior to Temodar administration if nausea/vomiting occurs. 30 tablet 1  . temozolomide (TEMODAR) 100 MG capsule Take 1 capsule (100 mg total) by mouth daily. May take on an empty stomach to decrease nausea & vomiting. 42 capsule 0  . naproxen sodium (ALEVE) 220 MG tablet Take 1 tablet (220 mg total) by mouth daily as needed (pain). (Patient not taking: Reported on 02/02/2019)     No current facility-administered medications on file prior to visit.    Allergies:  Allergies  Allergen Reactions  . Etodolac Other (See Comments)    GI upset  . Macrobid [Nitrofurantoin] Rash   Past Medical History:  Past Medical History:  Diagnosis Date  . High cholesterol   . Hypothyroidism   . Joint pain    Past Surgical History:  Past Surgical History:  Procedure Laterality Date  . APPLICATION OF CRANIAL NAVIGATION Left 12/23/2018   Procedure: APPLICATION OF CRANIAL  NAVIGATION;  Surgeon: Consuella Lose, MD;  Location: [facility];  Service: Neurosurgery;  Laterality: Left;  APPLICATION OF CRANIAL NAVIGATION  . BREAST BIOPSY Left   . COLONOSCOPY    . CRANIOTOMY Left 12/23/2018   Procedure: STEREOTACTIC LEFT OCCIPITAL CRANIOTOMY FOR RESECTION OF TUMOR;  Surgeon: Consuella Lose, MD;  Location: [facility];  Service: Neurosurgery;  Laterality: Left;  STEREOTACTIC LEFT OCCIPITAL CRANIOTOMY FOR RESECTION OF TUMOR  . LASIK     Social History:  Social History   Socioeconomic History  . Marital  status: Single    Spouse name: Not on file  . Number of children: Not on file  . Years of education: Not on file  . Highest education level: Not on file  Occupational History  . Not on file  Tobacco Use  . Smoking status: Never Smoker  . Smokeless tobacco: Never Used  Substance and Sexual Activity  . Alcohol use: Not Currently  . Drug use: Never  . Sexual activity: Not on file  Other Topics Concern  . Not on file  Social History Narrative  . Not on file   Social Determinants of Health   Financial Resource Strain:   . Difficulty of Paying Living Expenses:   Food Insecurity:   . Worried About Charity fundraiser in the Last Year:   . Arboriculturist in the Last Year:   Transportation Needs:   . Film/video editor (Medical):   Marland Kitchen Lack of Transportation (Non-Medical):   Physical Activity:   . Days of Exercise per Week:   . Minutes of Exercise per Session:   Stress:   . Feeling of Stress :   Social Connections:   . Frequency of Communication with Friends and Family:   . Frequency of Social Gatherings with Friends and Family:   . Attends Religious Services:   . Active Member of Clubs or Organizations:   . Attends Archivist Meetings:   Marland Kitchen Marital Status:   Intimate Partner Violence:   . Fear of Current or Ex-Partner:   . Emotionally Abused:   Marland Kitchen Physically Abused:   . Sexually Abused:    Family History: No family history on file.  Review of Systems: Constitutional: Denies fevers, chills or abnormal weight loss Eyes: Denies blurriness of vision Gastrointestinal:  Denies nausea, constipation, diarrhea GU: Denies dysuria or incontinence Skin: Denies abnormal skin rashes Musculoskeletal: Denies joint pain, back or neck discomfort.  Behavioral/Psych: Denies anxiety, mood instability  Physical Exam: Vitals:   03/16/19 1139  BP: 129/80  Pulse: 80  Resp: 16  Temp: 98.5 F (36.9 C)  SpO2: 100%   KPS: 80. General: Alert, cooperative, pleasant, in no  acute distress Head: Craniotomy scar noted, dry and intact. EENT: No conjunctival injection or scleral icterus. Oral mucosa moist Lungs: Resp effort normal Cardiac: Regular rate and rhythm Abdomen: Soft, non-distended abdomen Skin: No rashes cyanosis or petechiae. Extremities: No clubbing or edema  Neurologic Exam: Mental Status: Awake, alert, attentive to examiner. Oriented to self and environment. Language is fluent with intact comprehension.  Cranial Nerves: Visual acuity is grossly normal. Right homonymous hemianopia. Extra-ocular movements intact. No ptosis. Face is symmetric, tongue midline. Motor: Tone and bulk are normal. Power is full in both arms and legs. Reflexes are symmetric, no pathologic reflexes present. Intact finger to nose bilaterally Sensory: Intact to light touch and temperature Gait: Normal and tandem gait is deferred.   Labs: I have reviewed the data as listed    Component Value  Date/Time   NA 144 03/02/2019 1018   K 4.2 03/02/2019 1018   CL 107 03/02/2019 1018   CO2 27 03/02/2019 1018   GLUCOSE 98 03/02/2019 1018   BUN 16 03/02/2019 1018   CREATININE 0.99 03/02/2019 1018   CALCIUM 9.1 03/02/2019 1018   PROT 7.0 03/02/2019 1018   ALBUMIN 3.8 03/02/2019 1018   AST 18 03/02/2019 1018   ALT 12 03/02/2019 1018   ALKPHOS 96 03/02/2019 1018   BILITOT 0.5 03/02/2019 1018   GFRNONAA >60 03/02/2019 1018   GFRAA >60 03/02/2019 1018   Lab Results  Component Value Date   WBC 6.1 03/02/2019   NEUTROABS 3.9 03/02/2019   HGB 13.5 03/02/2019   HCT 41.7 03/02/2019   MCV 86.3 03/02/2019   PLT 298 03/02/2019     Assessment/Plan Glioblastoma with isocitrate dehydrogenase gene wildtype ([facility]) [C71.9]   CICILIA CLINGER is clinically improved 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  We recommended decreasing decadron to 75m daily at this time.  Will continue to titrate during clinic visit in 12 days following post-RT MRI brain.  All  questions were answered. The patient knows to call the clinic with any problems, questions or concerns. No barriers to learning were detected.  The total time spent in the encounter was 30 minutes and more than 50% was on counseling and review of test results   ZVentura Sellers MD Medical Director of Neuro-Oncology [facility]03/11/21 3:50 PM

## 2019-03-16 NOTE — Progress Notes (Signed)
I called the patient today about their upcoming follow-up appointment in radiation oncology.   Given the state of the  COVID-19 pandemic, concerning case numbers in our community, and guidance from [facility], I offered a phone assessment with the patient to determine if coming to the clinic was necessary. The patient accepted.  I let the patient know that I had spoken with Dr. Isidore Moos, and she wanted them to know the importance of washing their hands for at least 20 seconds at a time, especially after going out in public, and before they eat. Limit going out in public whenever possible. Do not touch your face, unless your hands are clean, such as when bathing. Get plenty of rest, eat well, and stay hydrated.   Symptomatically, the patient is doing relatively well. They report feeling well, without concerns at this time.   All questions were answered to the patient's satisfaction.  I encouraged the patient to call with any further questions. Otherwise, the plan is to see Dr. Mickeal Skinner on 03/28/19 for results of an MRI which will be completed on 03/24/19.    Patient is pleased with this plan, and we will cancel their upcoming follow-up to reduce the risk of COVID-19 transmission.

## 2019-03-17 ENCOUNTER — Ambulatory Visit
Admission: RE | Admit: 2019-03-17 | Discharge: 2019-03-17 | Disposition: A | Discharge status: Home / Self Care | Payer: BC Managed Care – PPO | Source: Ambulatory Visit | Attending: Radiation Oncology | Admitting: Radiation Oncology

## 2019-03-20 ENCOUNTER — Other Ambulatory Visit: Payer: Self-pay | Admitting: Internal Medicine

## 2019-03-20 DIAGNOSIS — C719 Malignant neoplasm of brain, unspecified: Secondary | ICD-10-CM

## 2019-03-21 NOTE — Progress Notes (Signed)
  Patient Name: Jillian Gomez MRN: [id number] DOB: March 12, 1955 Referring Physician: Nelda Bucks (Profile Not Attached) Date of Service: 03/03/2019 [facility] [place], [place]                                                        End Of Treatment Note  Diagnoses: C71.3-Malignant neoplasm of parietal lobe Glioblastoma  Intent: curative  Radiation Treatment Dates: 01/23/2019 through 03/03/2019  Site Technique Total Dose (Gy) Dose per Fx (Gy) Completed Fx Beam Energies  Parietal Lobe: Brain_L_Parie IMRT 46/46 2 23/23 6X  Parietal Lobe: Brain_Boost IMRT 14/14 2 7/7 6X   Narrative: The patient tolerated radiation therapy relatively well.   Plan: The patient will follow-up with radiation oncology in 1/2 mo, or as needed, depending on follow-up plans with neuro oncology.  -----------------------------------  Eppie Gibson, MD

## 2019-03-21 NOTE — Telephone Encounter (Signed)
Patient request refill

## 2019-03-24 ENCOUNTER — Other Ambulatory Visit: Payer: Self-pay

## 2019-03-24 ENCOUNTER — Ambulatory Visit (HOSPITAL_COMMUNITY)
Admission: RE | Admit: 2019-03-24 | Discharge: 2019-03-24 | Disposition: A | Discharge status: Home / Self Care | Payer: BC Managed Care – PPO | Source: Ambulatory Visit | Attending: Internal Medicine | Admitting: Internal Medicine

## 2019-03-24 DIAGNOSIS — C719 Malignant neoplasm of brain, unspecified: Secondary | ICD-10-CM | POA: Insufficient documentation

## 2019-03-24 IMAGING — MR MR HEAD WO/W CM
14 series · 48 of 48 positions shown · IV contrast (gadavist)
Comparison: MRI of the brain [DATE]

CLINICAL DATA: Glioblastoma follow-up.

EXAM:
MRI HEAD WITHOUT AND WITH CONTRAST
TECHNIQUE: Multiplanar, multiecho pulse sequences of the brain and surrounding
structures were obtained without and with intravenous contrast.
CONTRAST:  5mL GADAVIST GADOBUTROL 1 MMOL/ML IV SOLN

[Series 5: T1 · sagittal · 5.0mm · 0.75mm/px · 1 of 24 slices shown (1 of 2)]
[im 1/24]
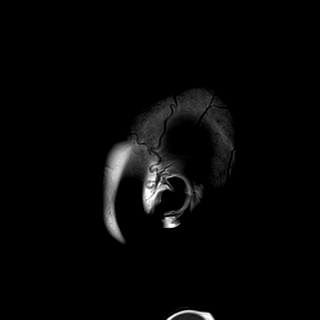

[Series 6: T2 · axial · 5.0mm · 0.62mm/px · 1 of 26 slices shown]
[im 1/26]
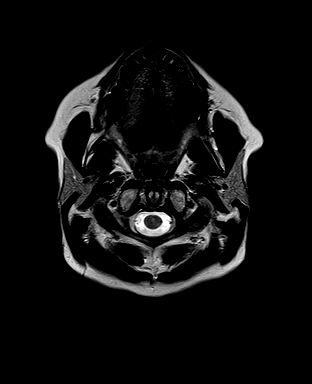

[Series 7: DWI · axial · 3.0mm · 1.36mm/px · z∈[-35,+118]mm · 6 of 104 slices shown (1 of 4)]
[im 1/104]
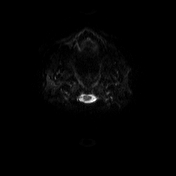
[im 21/104]
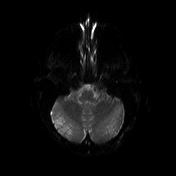
[im 42/104]
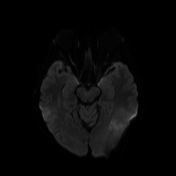
[im 62/104]
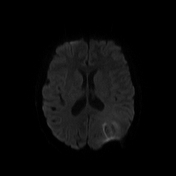
[im 83/104]
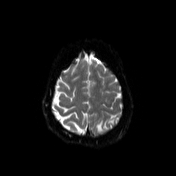
[im 104/104]
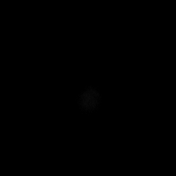

[Series 8: DWI · axial · 3.0mm · 1.36mm/px · z∈[-35,+118]mm · 3 of 52 slices shown (2 of 4)]
[im 1/52]
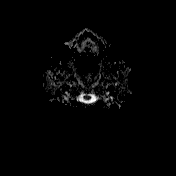
[im 26/52]
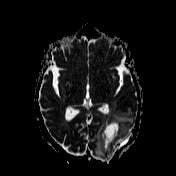
[im 52/52]
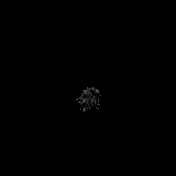

[Series 9: mip_images(sw) · axial · 24.0mm · 0.75mm/px · z∈[-24,+108]mm · 3 of 45 slices shown]
[im 1/45]
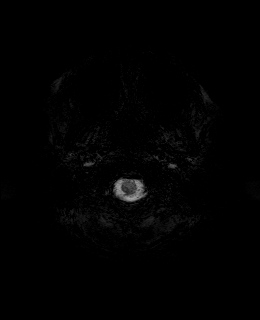
[im 23/45]
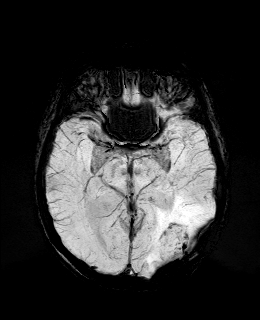
[im 45/45]
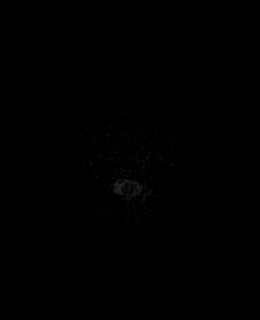

[Series 10: swi_images · axial · 3.0mm · 0.75mm/px · z∈[-35,+118]mm · 3 of 52 slices shown]
[im 1/52]
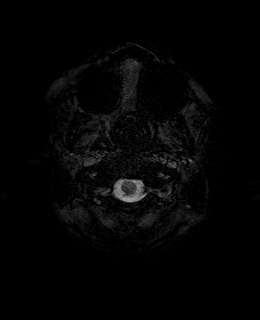
[im 26/52]
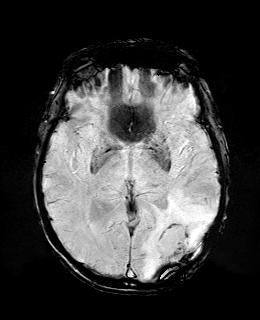
[im 52/52]
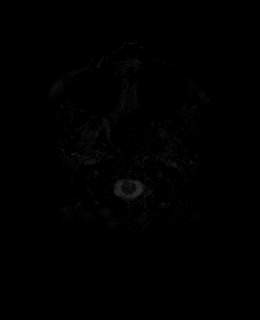

[Series 11: FLAIR · axial · 3.0mm · 0.75mm/px · z∈[-32,+115]mm · 3 of 50 slices shown]
[im 1/50]
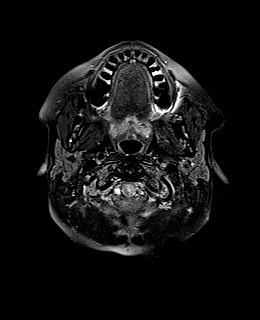
[im 25/50]
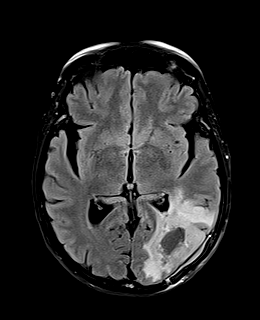
[im 50/50]
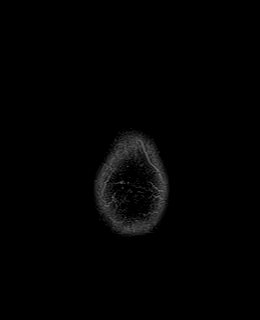

[Series 12: T1 · axial · 1.0mm · 0.94mm/px · z∈[-30,+113]mm · 8 of 144 slices shown (2 of 2)]
[im 1/144]
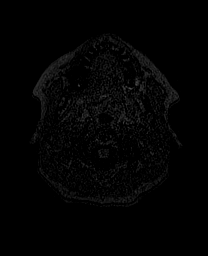
[im 21/144]
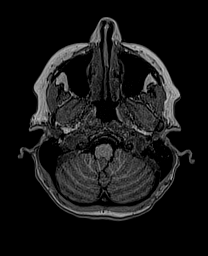
[im 41/144]
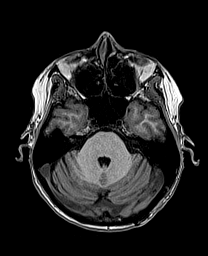
[im 62/144]
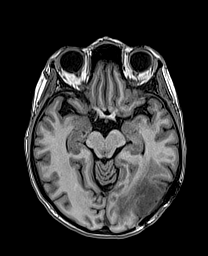
[im 82/144]
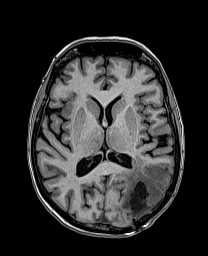
[im 103/144]
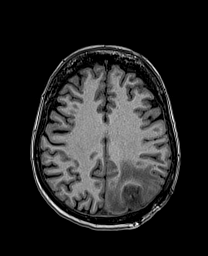
[im 123/144]
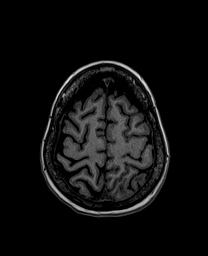
[im 144/144]
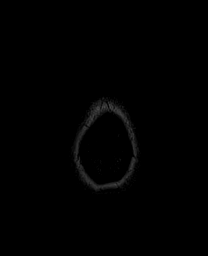

[Series 13: DWI · coronal · 5.0mm · 1.31mm/px · 4 of 64 slices shown (3 of 4)]
[im 1/64]
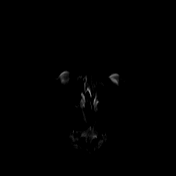
[im 22/64]
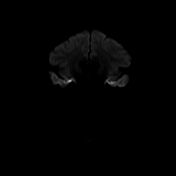
[im 43/64]
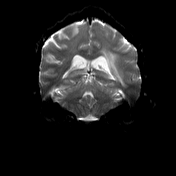
[im 64/64]
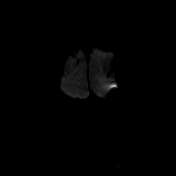

[Series 14: DWI · coronal · 5.0mm · 1.31mm/px · 2 of 32 slices shown (4 of 4)]
[im 1/32]
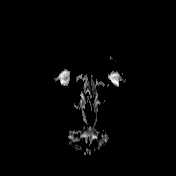
[im 32/32]
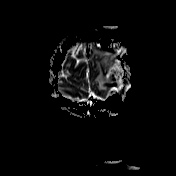

[Series 15: T2 post-contrast · coronal · 5.0mm · 0.57mm/px · 2 of 27 slices shown]
[im 1/27]
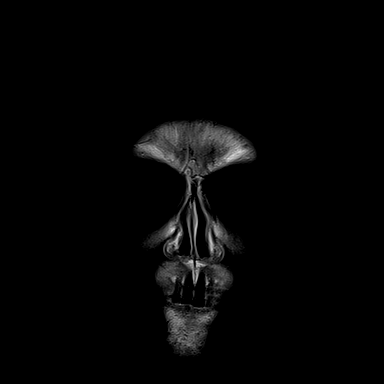
[im 27/27]
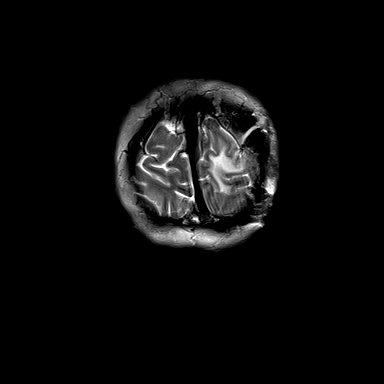

[Series 16: T1 post-contrast · axial · 1.0mm · 0.94mm/px · z∈[-41,+118]mm · 9 of 160 slices shown (1 of 3)]
[im 1/160]
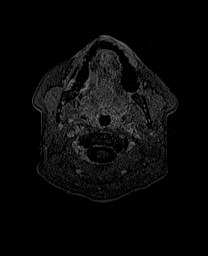
[im 20/160]
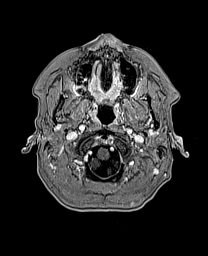
[im 40/160]
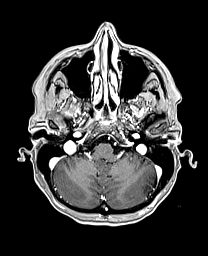
[im 60/160]
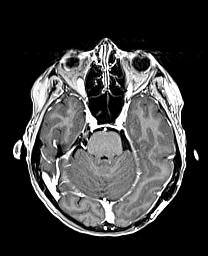
[im 80/160]
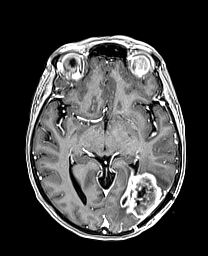
[im 100/160]
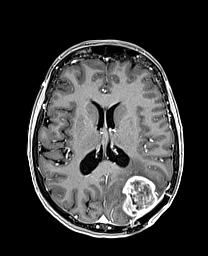
[im 120/160]
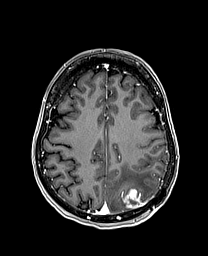
[im 140/160]
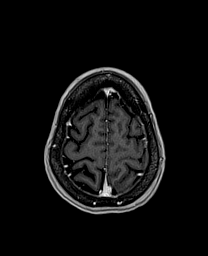
[im 160/160]
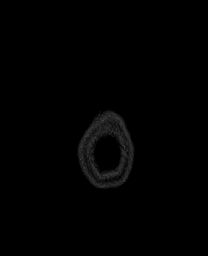

[Series 17: T1 post-contrast · coronal · 5.0mm · 0.43mm/px · 2 of 27 slices shown (2 of 3)]
[im 1/27]
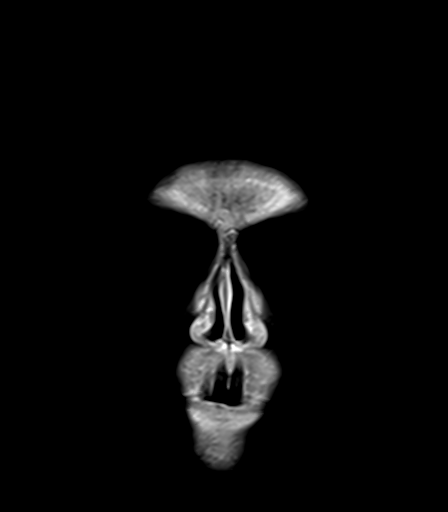
[im 27/27]
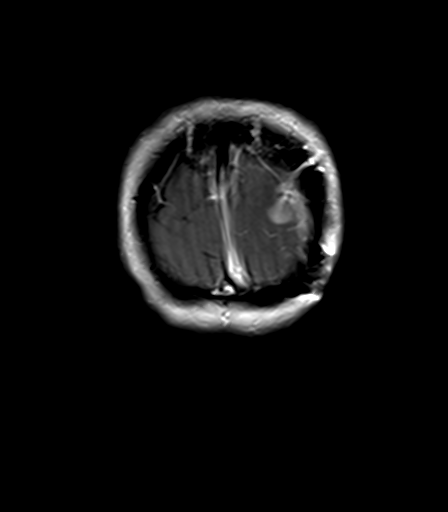

[Series 18: T1 post-contrast · sagittal · 5.0mm · 0.75mm/px · 1 of 24 slices shown (3 of 3)]
[im 1/24]
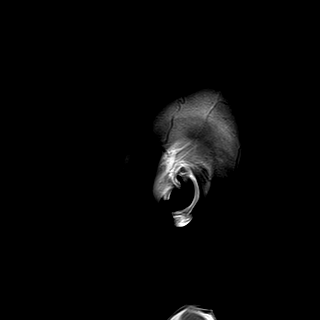

[48 of 48 positions shown; findings below may reference images not displayed]

FINDINGS: Brain: Status post left parietooccipital craniotomy for resection of
a left parietooccipital mass. There is significant interval increase
in size of the heterogeneous and irregular contrast enhancement at
the surgical cavity margins extending into the right occipital horn
subependymal lining. Surgical cavity with enhancing margins now
measures approximately 55 x 47 x 35 mm. Findings may be related to
radionecrosis versus disease progression. Continued follow-up
recommended. There is also interval increase of the T2
hyperintensity surrounding the surgical cavity, may represent edema
versus post treatment changes although tumor extension cannot be
excluded. No other focus of contrast enhancement seen elsewhere in
the brain. No acute infarct, extra-axial collection or hemorrhage.

Vascular: Normal flow voids.

Skull and upper cervical spine: Postsurgical changes from left
parietooccipital craniotomy. Otherwise normal marrow signal.

Sinuses/Orbits: Negative.

Other: None.
IMPRESSION: 1. Status post left parietooccipital craniotomy for resection of a
high-grade glioma. There is significant interval increase in size of
heterogeneous and irregular contrast enhancement at the surgical
cavity margins extending into the right occipital horn subependymal
lining. Findings may be related to radionecrosis versus disease
progression. Continued follow-up recommended.
2. Increased T2 hyperintensity surrounding the surgical cavity, may
represent edema versus post treatment changes although tumor
extension cannot be excluded.

These results will be called to the ordering clinician or
representative by the Radiologist Assistant, and communication
documented in the PACS or [REDACTED].

## 2019-03-24 MED ORDER — GADOBUTROL 1 MMOL/ML IV SOLN
5.0000 mL | Freq: Once | INTRAVENOUS | Status: AC | PRN
  Administered 2019-03-24: 5 mL via INTRAVENOUS

## 2019-03-27 ENCOUNTER — Inpatient Hospital Stay: Payer: BC Managed Care – PPO

## 2019-03-28 ENCOUNTER — Other Ambulatory Visit: Payer: Self-pay

## 2019-03-28 ENCOUNTER — Inpatient Hospital Stay: Payer: BC Managed Care – PPO | Admitting: Internal Medicine

## 2019-03-28 VITALS — BP 112/59 | HR 82 | Temp 98.2°F | Resp 18 | Ht 62.0 in | Wt 116.1 lb

## 2019-03-28 DIAGNOSIS — C719 Malignant neoplasm of brain, unspecified: Secondary | ICD-10-CM

## 2019-03-28 DIAGNOSIS — Z7982 Long term (current) use of aspirin: Secondary | ICD-10-CM | POA: Diagnosis not present

## 2019-03-28 DIAGNOSIS — Z7952 Long term (current) use of systemic steroids: Secondary | ICD-10-CM | POA: Diagnosis not present

## 2019-03-28 DIAGNOSIS — C714 Malignant neoplasm of occipital lobe: Secondary | ICD-10-CM | POA: Diagnosis not present

## 2019-03-28 DIAGNOSIS — Z79899 Other long term (current) drug therapy: Secondary | ICD-10-CM | POA: Diagnosis not present

## 2019-03-28 DIAGNOSIS — E7800 Pure hypercholesterolemia, unspecified: Secondary | ICD-10-CM | POA: Diagnosis not present

## 2019-03-28 DIAGNOSIS — E039 Hypothyroidism, unspecified: Secondary | ICD-10-CM | POA: Diagnosis not present

## 2019-03-28 MED ORDER — ONDANSETRON HCL 8 MG PO TABS: 8.0000 mg | ORAL_TABLET | Freq: Two times a day (BID) | ORAL | 1 refills | Status: DC | PRN

## 2019-03-28 MED ORDER — TEMOZOLOMIDE 100 MG PO CAPS: 200.0000 mg | ORAL_CAPSULE | Freq: Every day | ORAL | 0 refills | Status: DC

## 2019-03-28 MED ORDER — DEXAMETHASONE 4 MG PO TABS: 2.0000 mg | ORAL_TABLET | Freq: Every day | ORAL | 0 refills | Status: DC

## 2019-03-28 MED ORDER — TEMOZOLOMIDE 20 MG PO CAPS: 20.0000 mg | ORAL_CAPSULE | Freq: Every day | ORAL | 0 refills | Status: DC

## 2019-03-28 MED FILL — ONDANSETRON HCL 8 MG TABLET: 8 | 15 days supply | Qty: 30 | Fill #0

## 2019-03-28 NOTE — Progress Notes (Signed)
[facility] [place], [place] [address] [phone]   Interval Evaluation  Date of Service: 03/28/19 Patient Name: Jillian Gomez Patient MRN: [id number] Patient DOB: 04/14/1955 Provider: Ventura Sellers, MD  Identifying Statement:  Jillian Gomez is a 64 y.o. female with left occipital glioblastoma   Oncologic History: Oncology History  Glioblastoma with isocitrate dehydrogenase gene wildtype ([place])  12/23/2018 Surgery   Craniotomy, left occipital resection by Dr. Kathyrn Sheriff.  Path is GBM IDH-wt   01/23/2019 - 03/03/2019 Radiation Therapy   IMRT with concurrent Temozolomide 58m/m2     Biomarkers:  MGMT Unknown.  IDH 1/2 Wild type.  EGFR Unknown  TERT Unknown   Interval History:  Jillian GWINNERpresents today for follow up after post-radiation brain MRI.  She describes continued stability regarding energy, and no return of confusion and disorientation from two weeks prior.  Continues on 26mdaily decadron.  No further new or progressive deficits, no headaches or seizures.  Remains functionally independent as prior.  H+P (01/09/19) Patient presented to medical attention in early December with ~2 months history of right sided visual impairment.  She describes fuzzy or blurry vision on that side which was progressive over time, leading to at least one fall.  MRI brain demonstrated enhancing left occipital mass; this was subsequently resected by Dr. [name] 12/23/18.  She had no issues with surgery and has completed her steroid taper.  Continues on keppra daily but no history of any seizure. She has returned to work with only modest difficutly.  No issues walking or performing ADLs; lives alone but her sister and other family members are nearby.  Medications: Current Outpatient Medications on File Prior to Visit  Medication Sig Dispense Refill  . aspirin 81 MG chewable tablet Chew 1 tablet (81 mg total) by mouth daily.    . Marland Kitchenatorvastatin (LIPITOR) 40 MG tablet Take 40 mg by mouth daily.    . Biotin 1 MG CAPS Take 1 mg by mouth daily.    . Marland KitchenALCIUM PO Take 1 tablet by mouth daily.    . Marland Kitchenexamethasone (DECADRON) 4 MG tablet Take 1 tablet (4 mg total) by mouth daily. 30 tablet 0  . EUTHYROX 50 MCG tablet Take 50 mcg by mouth daily.    . naproxen sodium (ALEVE) 220 MG tablet Take 1 tablet (220 mg total) by mouth daily as needed (pain). (Patient not taking: Reported on 02/02/2019)    . ondansetron (ZOFRAN) 8 MG tablet Take 1 tablet (8 mg total) by mouth 2 (two) times daily as needed (nausea and vomiting). May take 30-60 minutes prior to Temodar administration if nausea/vomiting occurs. 30 tablet 1  . temozolomide (TEMODAR) 100 MG capsule Take 1 capsule (100 mg total) by mouth daily. May take on an empty stomach to decrease nausea & vomiting. 42 capsule 0   No current facility-administered medications on file prior to visit.    Allergies:  Allergies  Allergen Reactions  . Etodolac Other (See Comments)    GI upset  . Macrobid [Nitrofurantoin] Rash   Past Medical History:  Past Medical History:  Diagnosis Date  . High cholesterol   . Hypothyroidism   . Joint pain    Past Surgical History:  Past Surgical History:  Procedure Laterality Date  . APPLICATION OF CRANIAL NAVIGATION Left 12/23/2018   Procedure: APPLICATION OF CRANIAL NAVIGATION;  Surgeon: NuConsuella LoseMD;  [facility]: Neurosurgery;  Laterality: Left;  APPLICATION OF CRANIAL NAVIGATION  . BREAST BIOPSY Left   . COLONOSCOPY    . CRANIOTOMY Left 12/23/2018   Procedure: STEREOTACTIC LEFT OCCIPITAL CRANIOTOMY FOR RESECTION OF TUMOR;  Surgeon: Consuella Lose, MD;  Location: [facility];  Service: Neurosurgery;  Laterality: Left;  STEREOTACTIC LEFT OCCIPITAL CRANIOTOMY FOR RESECTION OF TUMOR  . LASIK     Social History:  Social History   Socioeconomic History  . Marital status: Single    Spouse name: Not on file  . Number of  children: Not on file  . Years of education: Not on file  . Highest education level: Not on file  Occupational History  . Not on file  Tobacco Use  . Smoking status: Never Smoker  . Smokeless tobacco: Never Used  Substance and Sexual Activity  . Alcohol use: Not Currently  . Drug use: Never  . Sexual activity: Not on file  Other Topics Concern  . Not on file  Social History Narrative  . Not on file   Social Determinants of Health   Financial Resource Strain:   . Difficulty of Paying Living Expenses:   Food Insecurity:   . Worried About Charity fundraiser in the Last Year:   . Arboriculturist in the Last Year:   Transportation Needs:   . Film/video editor (Medical):   Marland Kitchen Lack of Transportation (Non-Medical):   Physical Activity:   . Days of Exercise per Week:   . Minutes of Exercise per Session:   Stress:   . Feeling of Stress :   Social Connections:   . Frequency of Communication with Friends and Family:   . Frequency of Social Gatherings with Friends and Family:   . Attends Religious Services:   . Active Member of Clubs or Organizations:   . Attends Archivist Meetings:   Marland Kitchen Marital Status:   Intimate Partner Violence:   . Fear of Current or Ex-Partner:   . Emotionally Abused:   Marland Kitchen Physically Abused:   . Sexually Abused:    Family History: No family history on file.  Review of Systems: Constitutional: Denies fevers, chills or abnormal weight loss Eyes: Denies blurriness of vision Gastrointestinal:  Denies nausea, constipation, diarrhea GU: Denies dysuria or incontinence Skin: Denies abnormal skin rashes Musculoskeletal: Denies joint pain, back or neck discomfort.  Behavioral/Psych: Denies anxiety, mood instability  Physical Exam: Vitals:   03/28/19 1017  BP: (!) 112/59  Pulse: 82  Resp: 18  Temp: 98.2 F (36.8 C)  SpO2: 100%   KPS: 80. General: Alert, cooperative, pleasant, in no acute distress Head: Craniotomy scar noted, dry and  intact. EENT: No conjunctival injection or scleral icterus. Oral mucosa moist Lungs: Resp effort normal Cardiac: Regular rate and rhythm Abdomen: Soft, non-distended abdomen Skin: No rashes cyanosis or petechiae. Extremities: No clubbing or edema  Neurologic Exam: Mental Status: Awake, alert, attentive to examiner. Oriented to self and environment. Language is fluent with intact comprehension.  Cranial Nerves: Visual acuity is grossly normal. Right homonymous hemianopia. Extra-ocular movements intact. No ptosis. Face is symmetric, tongue midline. Motor: Tone and bulk are normal. Power is full in both arms and legs. Reflexes are symmetric, no pathologic reflexes present. Intact finger to nose bilaterally Sensory: Intact to light touch and temperature Gait: Normal and tandem gait is deferred.   Labs: I have reviewed the data as listed    Component Value Date/Time   NA 144 03/02/2019 1018   K 4.2 03/02/2019 1018   CL  107 03/02/2019 1018   CO2 27 03/02/2019 1018   GLUCOSE 98 03/02/2019 1018   BUN 16 03/02/2019 1018   CREATININE 0.99 03/02/2019 1018   CALCIUM 9.1 03/02/2019 1018   PROT 7.0 03/02/2019 1018   ALBUMIN 3.8 03/02/2019 1018   AST 18 03/02/2019 1018   ALT 12 03/02/2019 1018   ALKPHOS 96 03/02/2019 1018   BILITOT 0.5 03/02/2019 1018   GFRNONAA >60 03/02/2019 1018   GFRAA >60 03/02/2019 1018   Lab Results  Component Value Date   WBC 6.1 03/02/2019   NEUTROABS 3.9 03/02/2019   HGB 13.5 03/02/2019   HCT 41.7 03/02/2019   MCV 86.3 03/02/2019   PLT 298 03/02/2019    Imaging:  [facility] Clinician Interpretation: I have personally reviewed the CNS images as listed.  My interpretation, in the context of the patient's clinical presentation, is stable disease  MR Brain W Wo Contrast  Result Date: 03/24/2019 CLINICAL DATA:  Glioblastoma follow-up. EXAM: MRI HEAD WITHOUT AND WITH CONTRAST TECHNIQUE: Multiplanar, multiecho pulse sequences of the brain and surrounding structures  were obtained without and with intravenous contrast. CONTRAST:  99m GADAVIST GADOBUTROL 1 MMOL/ML IV SOLN COMPARISON:  MRI of the brain December 24, 2018 FINDINGS: Brain: Status post left parietooccipital craniotomy for resection of a left parietooccipital mass. There is significant interval increase in size of the heterogeneous and irregular contrast enhancement at the surgical cavity margins extending into the right occipital horn subependymal lining. Surgical cavity with enhancing margins now measures approximately 55 x 47 x 35 mm. Findings may be related to radionecrosis versus disease progression. Continued follow-up recommended. There is also interval increase of the T2 hyperintensity surrounding the surgical cavity, may represent edema versus post treatment changes although tumor extension cannot be excluded. No other focus of contrast enhancement seen elsewhere in the brain. No acute infarct, extra-axial collection or hemorrhage. Vascular: Normal flow voids. Skull and upper cervical spine: Postsurgical changes from left parietooccipital craniotomy. Otherwise normal marrow signal. Sinuses/Orbits: Negative. Other: None. IMPRESSION: 1. Status post left parietooccipital craniotomy for resection of a high-grade glioma. There is significant interval increase in size of heterogeneous and irregular contrast enhancement at the surgical cavity margins extending into the right occipital horn subependymal lining. Findings may be related to radionecrosis versus disease progression. Continued follow-up recommended. 2. Increased T2 hyperintensity surrounding the surgical cavity, may represent edema versus post treatment changes although tumor extension cannot be excluded. These results will be called to the ordering clinician or representative by the Radiologist Assistant, and communication documented in the PACS or [employer] Electronically Signed   By: KPedro EarlsM.D.   On: 03/24/2019 17:09     Assessment/Plan Glioblastoma with isocitrate dehydrogenase gene wildtype ([place] [C71.9]   RSHALISHA CLAUSINGis clinically stable today.  MRI demonstrates progressive changes most likely consistent with radiation treatment effect, or pseudoprogression, rather than organic tumor.  Brisk clinical response to decadron therapy is supportive of inflammatory etiology.  Various treatment "next step" treatment options were discussed, including chemotherapy and tumor-treating fields.  We recommended initiating treatment with Temozolomide 150 mg/m2, on for five days and off for twenty three days in twenty eight day cycles. The patient will have a complete blood count performed on days 21 and 28 of each cycle, and a comprehensive metabolic panel performed on day 28 of each cycle. Labs may need to be performed more often. Zofran will prescribed for home use for nausea/vomiting.   Chemotherapy should be held for the following:  ANC less than 1,000  Platelets less than 100,000  LFT or creatinine greater than 2x ULN  If clinical concerns/contraindications develop  We recommended continuing decadron 60m daily given persistent inflammatory signs on imaging.  She should follow up in 1 month with labs for evaluation prior to cycle #2.  All questions were answered. The patient knows to call the clinic with any problems, questions or concerns. No barriers to learning were detected.  The total time spent in the encounter was 40 minutes and more than 50% was on counseling and review of test results   ZVentura Sellers MD Medical Director of Neuro-Oncology [facility]03/23/21 10:18 AM

## 2019-03-28 NOTE — Progress Notes (Signed)
DISCONTINUE ON PATHWAY REGIMEN - Neuro     One cycle, daily for 42 days concurrent with RT:     Temozolomide   **Always confirm dose/schedule in your pharmacy ordering system**  REASON: Continuation Of Treatment PRIOR TREATMENT: [id number]: Radiation Therapy with Concurrent Temozolomide 75 mg/m2 Daily x 6 Weeks, Followed by Sequential Temozolomide TREATMENT RESPONSE: Stable Disease (SD)  START ON PATHWAY REGIMEN - Neuro     A cycle is every 28 days:     Temozolomide      Temozolomide   **Always confirm dose/schedule in your pharmacy ordering system**  Patient Characteristics: Glioblastoma (Grade IV Glioma), Newly Diagnosed / Treatment Naive, Good Performance Status and/or Younger Patient, MGMT Promoter Unmethylated/Unknown Disease Classification: Glioma Disease Classification: Glioblastoma (Grade IV Glioma) Disease Status: Newly Diagnosed / Treatment Naive Performance Status: Good Performance Status and/or Younger Patient MGMT Promoter Methylation Status: Awaiting Test Results Intent of Therapy: Non-Curative / Palliative Intent, Discussed with Patient

## 2019-03-29 ENCOUNTER — Telehealth: Payer: Self-pay | Admitting: Internal Medicine

## 2019-03-29 MED FILL — TEMOZOLOMIDE 100 MG CAPS: 100 | 5 days supply | Qty: 10 | Fill #0

## 2019-03-29 MED FILL — TEMOZOLOMIDE 20 MG CAPS: 20 | 5 days supply | Qty: 5 | Fill #0

## 2019-03-29 NOTE — Telephone Encounter (Signed)
Scheduled appt per 3/23 los.  Left a vm of the appt date and time.

## 2019-04-21 ENCOUNTER — Other Ambulatory Visit: Payer: Self-pay | Admitting: Internal Medicine

## 2019-04-21 DIAGNOSIS — C719 Malignant neoplasm of brain, unspecified: Secondary | ICD-10-CM

## 2019-04-24 ENCOUNTER — Other Ambulatory Visit: Payer: Self-pay | Admitting: *Deleted

## 2019-04-24 ENCOUNTER — Other Ambulatory Visit: Payer: Self-pay | Admitting: Internal Medicine

## 2019-04-24 DIAGNOSIS — C719 Malignant neoplasm of brain, unspecified: Secondary | ICD-10-CM

## 2019-04-24 MED ORDER — DEXAMETHASONE 2 MG PO TABS: 2.0000 mg | ORAL_TABLET | Freq: Every day | ORAL | 1 refills | Status: DC

## 2019-04-24 MED FILL — DEXAMETHASONE 2 MG TABLET: 2 | 30 days supply | Qty: 30 | Fill #0

## 2019-04-24 NOTE — Telephone Encounter (Signed)
Rx request 

## 2019-05-02 ENCOUNTER — Inpatient Hospital Stay: Payer: BC Managed Care – PPO

## 2019-05-02 ENCOUNTER — Other Ambulatory Visit: Payer: Self-pay | Admitting: *Deleted

## 2019-05-02 ENCOUNTER — Inpatient Hospital Stay: Payer: BC Managed Care – PPO | Attending: Internal Medicine | Admitting: Internal Medicine

## 2019-05-02 ENCOUNTER — Other Ambulatory Visit: Payer: Self-pay

## 2019-05-02 VITALS — BP 123/77 | HR 66 | Temp 98.9°F | Resp 18 | Ht 62.0 in | Wt 117.5 lb

## 2019-05-02 DIAGNOSIS — Z7982 Long term (current) use of aspirin: Secondary | ICD-10-CM | POA: Diagnosis not present

## 2019-05-02 DIAGNOSIS — E7800 Pure hypercholesterolemia, unspecified: Secondary | ICD-10-CM | POA: Diagnosis not present

## 2019-05-02 DIAGNOSIS — E039 Hypothyroidism, unspecified: Secondary | ICD-10-CM | POA: Diagnosis not present

## 2019-05-02 DIAGNOSIS — Z79899 Other long term (current) drug therapy: Secondary | ICD-10-CM | POA: Diagnosis not present

## 2019-05-02 DIAGNOSIS — C714 Malignant neoplasm of occipital lobe: Secondary | ICD-10-CM | POA: Diagnosis not present

## 2019-05-02 DIAGNOSIS — C719 Malignant neoplasm of brain, unspecified: Secondary | ICD-10-CM | POA: Diagnosis not present

## 2019-05-02 DIAGNOSIS — Z923 Personal history of irradiation: Secondary | ICD-10-CM | POA: Insufficient documentation

## 2019-05-02 DIAGNOSIS — Z7952 Long term (current) use of systemic steroids: Secondary | ICD-10-CM | POA: Diagnosis not present

## 2019-05-02 DIAGNOSIS — R112 Nausea with vomiting, unspecified: Secondary | ICD-10-CM | POA: Diagnosis not present

## 2019-05-02 LAB — CMP (CANCER CENTER ONLY)
ALT: 18 U/L (ref 0–44)
AST: 16 U/L (ref 15–41)
Albumin: 3.7 g/dL (ref 3.5–5.0)
Alkaline Phosphatase: 71 U/L (ref 38–126)
Anion gap: 10 (ref 5–15)
BUN: 18 mg/dL (ref 8–23)
CO2: 27 mmol/L (ref 22–32)
Calcium: 9.2 mg/dL (ref 8.9–10.3)
Chloride: 106 mmol/L (ref 98–111)
Creatinine: 0.87 mg/dL (ref 0.44–1.00)
GFR, Est AFR Am: >60 mL/min (ref >60)
GFR, Estimated: >60 mL/min (ref >60)
Glucose, Bld: 83 mg/dL (ref 70–99)
Potassium: 3.9 mmol/L (ref 3.5–5.1)
Sodium: 143 mmol/L (ref 135–145)
Total Bilirubin: 0.5 mg/dL (ref 0.3–1.2)
Total Protein: 6.7 g/dL (ref 6.5–8.1)

## 2019-05-02 LAB — CBC WITH DIFFERENTIAL (CANCER CENTER ONLY)
Abs Immature Granulocytes: 0.01 10*3/uL (ref 0.00–0.07)
Basophils Absolute: 0 10*3/uL (ref 0.0–0.1)
Basophils Relative: 1 %
Eosinophils Absolute: 0.1 10*3/uL (ref 0.0–0.5)
Eosinophils Relative: 1 %
HCT: 43.6 % (ref 36.0–46.0)
Hemoglobin: 14.4 g/dL (ref 12.0–15.0)
Immature Granulocytes: 0 %
Lymphocytes Relative: 29 %
Lymphs Abs: 2.1 10*3/uL (ref 0.7–4.0)
MCH: 29.5 pg (ref 26.0–34.0)
MCHC: 33 g/dL (ref 30.0–36.0)
MCV: 89.3 fL (ref 80.0–100.0)
Monocytes Absolute: 0.6 10*3/uL (ref 0.1–1.0)
Monocytes Relative: 8 %
Neutro Abs: 4.5 10*3/uL (ref 1.7–7.7)
Neutrophils Relative %: 61 %
Platelet Count: 241 10*3/uL (ref 150–400)
RBC: 4.88 MIL/uL (ref 3.87–5.11)
RDW: 14.3 % (ref 11.5–15.5)
WBC Count: 7.3 10*3/uL (ref 4.0–10.5)
nRBC: 0 % (ref 0.0–0.2)

## 2019-05-02 LAB — LIPID PANEL
Cholesterol: 263 mg/dL — ABNORMAL HIGH (ref 0–200)
HDL: 61 mg/dL (ref >40)
LDL Cholesterol: 167 mg/dL — ABNORMAL HIGH (ref 0–99)
Total CHOL/HDL Ratio: 4.3 RATIO
Triglycerides: 177 mg/dL — ABNORMAL HIGH (ref <150)
VLDL: 35 mg/dL (ref 0–40)

## 2019-05-02 LAB — VITAMIN D 25 HYDROXY (VIT D DEFICIENCY, FRACTURES): Vit D, 25-Hydroxy: 62.63 ng/mL (ref 30–100)

## 2019-05-02 MED ORDER — TEMOZOLOMIDE 100 MG PO CAPS: 300.0000 mg | ORAL_CAPSULE | Freq: Every day | ORAL | 0 refills | Status: DC

## 2019-05-02 MED FILL — TEMOZOLOMIDE 100 MG CAPS: 100 | 5 days supply | Qty: 15 | Fill #0

## 2019-05-02 NOTE — Progress Notes (Signed)
[facility] at [place], [place] [address] [phone]   Interval Evaluation  Date of Service: 05/02/19 Patient Name: Jillian Gomez Patient MRN: [id number] Patient DOB: 1955-05-18 Provider: Ventura Sellers, MD  Identifying Statement:  Jillian Gomez is a 64 y.o. female with left occipital glioblastoma   Oncologic History: Oncology History  Glioblastoma with isocitrate dehydrogenase gene wildtype ([place])  12/23/2018 Surgery   Craniotomy, left occipital resection by Dr. Kathyrn Sheriff.  Path is GBM IDH-wt   01/23/2019 - 03/03/2019 Radiation Therapy   IMRT with concurrent Temozolomide 68m/m2   03/28/2019 -  Chemotherapy   The patient had [No matching medication found in this treatment plan]  for chemotherapy treatment.      Biomarkers:  MGMT Unknown.  IDH 1/2 Wild type.  EGFR Unknown  TERT Unknown   Interval History:  RVIKTORYA ARGUIJOpresents today for follow up after cycle #1 TMZ.  No problems tolerating therapy.  She describes continued stability regarding energy, and no return of confusion and disorientation from prior.  Continues on 216mdaily decadron.  No further new or progressive deficits, no headaches or seizures.  Remains functionally independent as prior.  H+P (01/09/19) Patient presented to medical attention in early December with ~2 months history of right sided visual impairment.  She describes fuzzy or blurry vision on that side which was progressive over time, leading to at least one fall.  MRI brain demonstrated enhancing left occipital mass; this was subsequently resected by Dr. [name] 12/23/18.  She had no issues with surgery and has completed her steroid taper.  Continues on keppra daily but no history of any seizure. She has returned to work with only modest difficutly.  No issues walking or performing ADLs; lives alone but her sister and other family members are nearby.  Medications: Current Outpatient  Medications on File Prior to Visit  Medication Sig Dispense Refill  . aspirin 81 MG chewable tablet Chew 1 tablet (81 mg total) by mouth daily.    . Marland Kitchentorvastatin (LIPITOR) 40 MG tablet Take 40 mg by mouth daily.    . Biotin 1 MG CAPS Take 1 mg by mouth daily.    . Marland KitchenALCIUM PO Take 1 tablet by mouth daily.    . Marland Kitchenexamethasone (DECADRON) 2 MG tablet Take 1 tablet (2 mg total) by mouth daily. 30 tablet 1  . EUTHYROX 50 MCG tablet Take 50 mcg by mouth daily.    . naproxen sodium (ALEVE) 220 MG tablet Take 1 tablet (220 mg total) by mouth daily as needed (pain). (Patient not taking: Reported on 02/02/2019)    . ondansetron (ZOFRAN) 8 MG tablet Take 1 tablet (8 mg total) by mouth 2 (two) times daily as needed (nausea and vomiting). May take 30-60 minutes prior to Temodar administration if nausea/vomiting occurs. (Patient not taking: Reported on 05/02/2019) 30 tablet 1  . temozolomide (TEMODAR) 100 MG capsule Take 2 capsules (200 mg total) by mouth daily. May take on an empty stomach to decrease nausea & vomiting. (Patient not taking: Reported on 05/02/2019) 10 capsule 0  . temozolomide (TEMODAR) 20 MG capsule Take 1 capsule (20 mg total) by mouth daily. May take on an empty stomach to decrease nausea & vomiting. (Patient not taking: Reported on 05/02/2019) 5 capsule 0   No current facility-administered medications on file prior to visit.    Allergies:  Allergies  Allergen Reactions  . Etodolac Other (See Comments)    GI upset  .  Macrobid [Nitrofurantoin] Rash   Past Medical History:  Past Medical History:  Diagnosis Date  . High cholesterol   . Hypothyroidism   . Joint pain    Past Surgical History:  Past Surgical History:  Procedure Laterality Date  . APPLICATION OF CRANIAL NAVIGATION Left 12/23/2018   Procedure: APPLICATION OF CRANIAL NAVIGATION;  Surgeon: Consuella Lose, MD;  Location: [facility];  Service: Neurosurgery;  Laterality: Left;  APPLICATION OF CRANIAL NAVIGATION  . BREAST BIOPSY  Left   . COLONOSCOPY    . CRANIOTOMY Left 12/23/2018   Procedure: STEREOTACTIC LEFT OCCIPITAL CRANIOTOMY FOR RESECTION OF TUMOR;  Surgeon: Consuella Lose, MD;  Location: [facility];  Service: Neurosurgery;  Laterality: Left;  STEREOTACTIC LEFT OCCIPITAL CRANIOTOMY FOR RESECTION OF TUMOR  . LASIK     Social History:  Social History   Socioeconomic History  . Marital status: Single    Spouse name: Not on file  . Number of children: Not on file  . Years of education: Not on file  . Highest education level: Not on file  Occupational History  . Not on file  Tobacco Use  . Smoking status: Never Smoker  . Smokeless tobacco: Never Used  Substance and Sexual Activity  . Alcohol use: Not Currently  . Drug use: Never  . Sexual activity: Not on file  Other Topics Concern  . Not on file  Social History Narrative  . Not on file   Social Determinants of Health   Financial Resource Strain:   . Difficulty of Paying Living Expenses:   Food Insecurity:   . Worried About Charity fundraiser in the Last Year:   . Arboriculturist in the Last Year:   Transportation Needs:   . Film/video editor (Medical):   Marland Kitchen Lack of Transportation (Non-Medical):   Physical Activity:   . Days of Exercise per Week:   . Minutes of Exercise per Session:   Stress:   . Feeling of Stress :   Social Connections:   . Frequency of Communication with Friends and Family:   . Frequency of Social Gatherings with Friends and Family:   . Attends Religious Services:   . Active Member of Clubs or Organizations:   . Attends Archivist Meetings:   Marland Kitchen Marital Status:   Intimate Partner Violence:   . Fear of Current or Ex-Partner:   . Emotionally Abused:   Marland Kitchen Physically Abused:   . Sexually Abused:    Family History: No family history on file.  Review of Systems: Constitutional: Denies fevers, chills or abnormal weight loss Eyes: Denies blurriness of vision Gastrointestinal:  Denies nausea, constipation,  diarrhea GU: Denies dysuria or incontinence Skin: Denies abnormal skin rashes Musculoskeletal: Denies joint pain, back or neck discomfort.  Behavioral/Psych: Denies anxiety, mood instability  Physical Exam: Vitals:   05/02/19 1009  BP: 123/77  Pulse: 66  Resp: 18  Temp: 98.9 F (37.2 C)  SpO2: 98%   KPS: 80. General: Alert, cooperative, pleasant, in no acute distress Head: Craniotomy scar noted, dry and intact. EENT: No conjunctival injection or scleral icterus. Oral mucosa moist Lungs: Resp effort normal Cardiac: Regular rate and rhythm Abdomen: Soft, non-distended abdomen Skin: No rashes cyanosis or petechiae. Extremities: No clubbing or edema  Neurologic Exam: Mental Status: Awake, alert, attentive to examiner. Oriented to self and environment. Language is fluent with intact comprehension.  Cranial Nerves: Visual acuity is grossly normal. Right homonymous hemianopia. Extra-ocular movements intact. No ptosis. Face is symmetric, tongue  midline. Motor: Tone and bulk are normal. Power is full in both arms and legs. Reflexes are symmetric, no pathologic reflexes present. Intact finger to nose bilaterally Sensory: Intact to light touch and temperature Gait: Normal and tandem gait is deferred.   Labs: I have reviewed the data as listed    Component Value Date/Time   NA 144 03/02/2019 1018   K 4.2 03/02/2019 1018   CL 107 03/02/2019 1018   CO2 27 03/02/2019 1018   GLUCOSE 98 03/02/2019 1018   BUN 16 03/02/2019 1018   CREATININE 0.99 03/02/2019 1018   CALCIUM 9.1 03/02/2019 1018   PROT 7.0 03/02/2019 1018   ALBUMIN 3.8 03/02/2019 1018   AST 18 03/02/2019 1018   ALT 12 03/02/2019 1018   ALKPHOS 96 03/02/2019 1018   BILITOT 0.5 03/02/2019 1018   GFRNONAA >60 03/02/2019 1018   GFRAA >60 03/02/2019 1018   Lab Results  Component Value Date   WBC 7.3 05/02/2019   NEUTROABS 4.5 05/02/2019   HGB 14.4 05/02/2019   HCT 43.6 05/02/2019   MCV 89.3 05/02/2019   PLT 241  05/02/2019     Assessment/Plan Glioblastoma with isocitrate dehydrogenase gene wildtype ([place]) [C71.9]   SHANE MELBY is clinically stable today following cycle #1 of 5-day Temodar.    We recommended continuing treatment with Temozolomide, with cycle #2 increased to 200 mg/m2, on for five days and off for twenty three days in twenty eight day cycles. The patient will have a complete blood count performed on days 21 and 28 of each cycle, and a comprehensive metabolic panel performed on day 28 of each cycle. Labs may need to be performed more often. Zofran will prescribed for home use for nausea/vomiting.   Chemotherapy should be held for the following:  ANC less than 1,000  Platelets less than 100,000  LFT or creatinine greater than 2x ULN  If clinical concerns/contraindications develop  We recommended decreasign decadron to 66m daily x1 week, then discontinuing after another week.  She should follow up in 1 month with MRI brain for evaluation prior to cycle #3.  All questions were answered. The patient knows to call the clinic with any problems, questions or concerns. No barriers to learning were detected.  The total time spent in the encounter was 30 minutes and more than 50% was on counseling and review of test results   ZVentura Sellers MD Medical Director of Neuro-Oncology [facility] [place]04/27/21 10:24 AM

## 2019-05-03 ENCOUNTER — Telehealth: Payer: Self-pay | Admitting: Internal Medicine

## 2019-05-03 NOTE — Telephone Encounter (Signed)
Scheduled appt per 4/27 los.  Spoke with pt and she is aware of the appt date and time

## 2019-05-05 ENCOUNTER — Telehealth: Payer: Self-pay | Admitting: *Deleted

## 2019-05-05 NOTE — Telephone Encounter (Signed)
Received vm message from pt's sister regarding worsening confusion while on steroid taper this week. TCT [name], pt's sister.  No answer but was able to leave vm message for her on identified phone. Advised that she can call back and speak with the on call service who can, in turn,  Call the on call physician for advise on steroid dosage. Advised that I would also make  Dr. Mickeal Skinner aware of these concerns.

## 2019-05-08 ENCOUNTER — Telehealth: Payer: Self-pay | Admitting: *Deleted

## 2019-05-08 DIAGNOSIS — C719 Malignant neoplasm of brain, unspecified: Secondary | ICD-10-CM | POA: Diagnosis not present

## 2019-05-08 DIAGNOSIS — H53461 Homonymous bilateral field defects, right side: Secondary | ICD-10-CM | POA: Diagnosis not present

## 2019-05-08 DIAGNOSIS — H04123 Dry eye syndrome of bilateral lacrimal glands: Secondary | ICD-10-CM | POA: Diagnosis not present

## 2019-05-08 NOTE — Telephone Encounter (Signed)
Patient follow up completed: Patient states that per Dr. Renda Rolls steroid taper instructions she dropped to 1 mg and this past Friday she reports having severe confusion on Friday as to where she couldn't comprehend how to turn a car off.  She reports since then she hasn't driven because of that.  She states that her comprehension has improved since calling in on Friday when Dr. Mickeal Skinner advised her to Take Decadron 4 mg on Friday and then down to Decadron 2 mg everyday thereafter. She reports that her mental processing is still slow.    Per Dr. Mickeal Skinner advised her to continue Decadron 2 mg daily and will give a couple more days to see if she levels out on new dose of 2 mg and Phone visit scheduled for Thursday.  Patient aware and denies any further compliants.

## 2019-05-09 ENCOUNTER — Other Ambulatory Visit: Payer: Self-pay | Admitting: Radiation Therapy

## 2019-05-10 ENCOUNTER — Telehealth: Payer: Self-pay | Admitting: Internal Medicine

## 2019-05-11 ENCOUNTER — Inpatient Hospital Stay: Payer: BC Managed Care – PPO | Attending: Internal Medicine | Admitting: Internal Medicine

## 2019-05-11 ENCOUNTER — Telehealth: Payer: Self-pay | Admitting: *Deleted

## 2019-05-11 DIAGNOSIS — E039 Hypothyroidism, unspecified: Secondary | ICD-10-CM | POA: Insufficient documentation

## 2019-05-11 DIAGNOSIS — Z7952 Long term (current) use of systemic steroids: Secondary | ICD-10-CM | POA: Insufficient documentation

## 2019-05-11 DIAGNOSIS — C714 Malignant neoplasm of occipital lobe: Secondary | ICD-10-CM | POA: Insufficient documentation

## 2019-05-11 DIAGNOSIS — Z9221 Personal history of antineoplastic chemotherapy: Secondary | ICD-10-CM | POA: Insufficient documentation

## 2019-05-11 DIAGNOSIS — Z923 Personal history of irradiation: Secondary | ICD-10-CM | POA: Insufficient documentation

## 2019-05-11 DIAGNOSIS — C719 Malignant neoplasm of brain, unspecified: Secondary | ICD-10-CM | POA: Diagnosis not present

## 2019-05-11 DIAGNOSIS — Z79899 Other long term (current) drug therapy: Secondary | ICD-10-CM | POA: Insufficient documentation

## 2019-05-11 DIAGNOSIS — Z7982 Long term (current) use of aspirin: Secondary | ICD-10-CM | POA: Insufficient documentation

## 2019-05-11 DIAGNOSIS — E7800 Pure hypercholesterolemia, unspecified: Secondary | ICD-10-CM | POA: Insufficient documentation

## 2019-05-11 MED ORDER — DEXAMETHASONE 2 MG PO TABS: 4.0000 mg | ORAL_TABLET | Freq: Every day | ORAL | 1 refills | Status: DC

## 2019-05-11 NOTE — Addendum Note (Signed)
Addended by: Ventura Sellers on: 05/11/2019 05:16 PM   Modules accepted: Orders

## 2019-05-11 NOTE — Telephone Encounter (Signed)
Patient called to advise that she decided to increase the Decadron to 4 mg daily.  She called to make MD aware and request refill to her pharmacy.   Routed to MD to review and process.

## 2019-05-11 NOTE — Progress Notes (Signed)
I connected with Jillian Gomez on 05/11/19 at 11:30 AM EDT by telephone visit and verified that I am speaking with the correct person using two identifiers.  I discussed the limitations, risks, security and privacy concerns of performing an evaluation and management service by telemedicine and the availability of in-person appointments. I also discussed with the patient that there may be a patient responsible charge related to this service. The patient expressed understanding and agreed to proceed.  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  She is currently taking 2mg  each morning.  She doesn't quite feel back to prior baseline, but clear improvement from last week.  No seizures or headaches. Observations: Language and cognition stable Assessment and Plan: Will recommend continuing current dose level of decadron, 2mg  daily, until next visit later this month following MRI brain. Follow Up Instructions: RTC as scheduled.  I discussed the assessment and treatment plan with the patient.  The patient was provided an opportunity to ask questions and all were answered.  The patient agreed with the plan and demonstrated understanding of the instructions.     The patient was advised to call back or seek an in-person evaluation if the symptoms worsen or if the condition fails to improve as anticipated.  I provided 5-10 minutes of non-face-to-face time during this enocunter.  Ventura Sellers, MD   I provided 12 minutes of non face-to-face telephone visit time during this encounter, and > 50% was spent counseling as documented under my assessment & plan.

## 2019-05-26 ENCOUNTER — Other Ambulatory Visit: Payer: Self-pay

## 2019-05-26 ENCOUNTER — Ambulatory Visit (HOSPITAL_COMMUNITY)
Admission: RE | Admit: 2019-05-26 | Discharge: 2019-05-26 | Disposition: A | Discharge status: Home / Self Care | Payer: BC Managed Care – PPO | Source: Ambulatory Visit | Attending: Internal Medicine | Admitting: Internal Medicine

## 2019-05-26 DIAGNOSIS — C719 Malignant neoplasm of brain, unspecified: Secondary | ICD-10-CM | POA: Diagnosis not present

## 2019-05-26 IMAGING — MR MR HEAD WO/W CM
14 series · 48 of 48 positions shown · IV contrast (gadavist)
Comparison: MRI head [DATE]

CLINICAL DATA: Glioblastoma IDH wild type.  Postop resection

EXAM:
MRI HEAD WITHOUT AND WITH CONTRAST
TECHNIQUE: Multiplanar, multiecho pulse sequences of the brain and surrounding
structures were obtained without and with intravenous contrast.
CONTRAST:  5mL GADAVIST GADOBUTROL 1 MMOL/ML IV SOLN

[Series 5: DWI · axial · 3.0mm · 1.36mm/px · z∈[-82,+69]mm · 6 of 104 slices shown (1 of 4)]
[im 1/104]
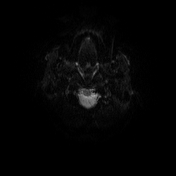
[im 21/104]
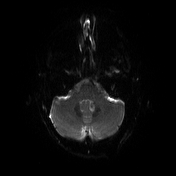
[im 42/104]
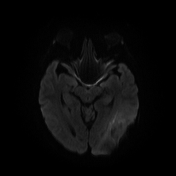
[im 62/104]
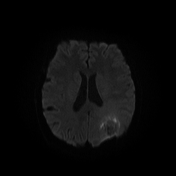
[im 83/104]
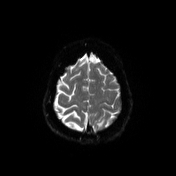
[im 104/104]
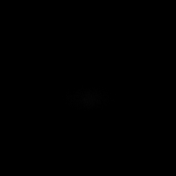

[Series 6: DWI · axial · 3.0mm · 1.36mm/px · z∈[-82,+69]mm · 4 of 52 slices shown (2 of 4)]
[im 1/52]
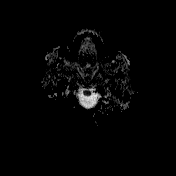
[im 18/52]
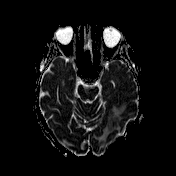
[im 35/52]
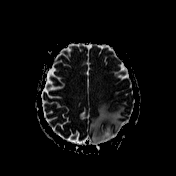
[im 52/52]
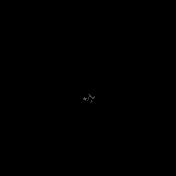

[Series 7: T1 · sagittal · 5.0mm · 0.75mm/px · 1 of 24 slices shown (1 of 2)]
[im 1/24]
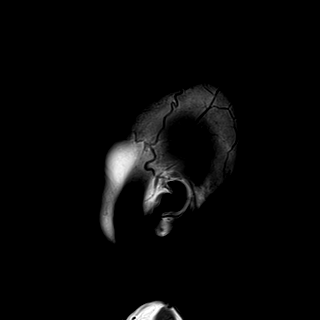

[Series 8: T2 · axial · 5.0mm · 0.62mm/px · 1 of 26 slices shown]
[im 1/26]
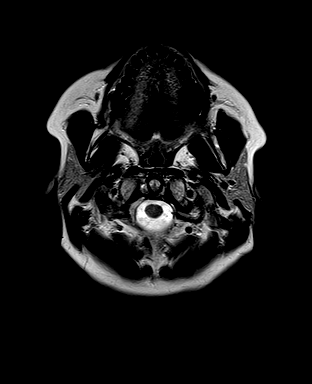

[Series 9: mip_images(sw) · axial · 24.0mm · 0.75mm/px · z∈[-83,+71]mm · 3 of 53 slices shown]
[im 1/53]
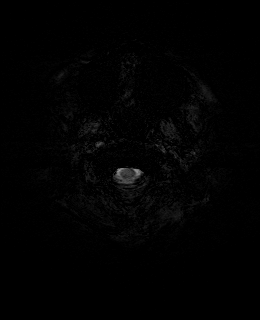
[im 27/53]
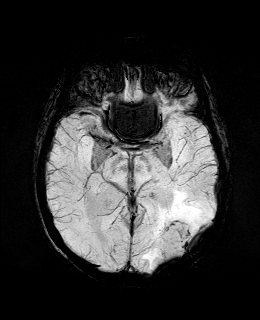
[im 53/53]
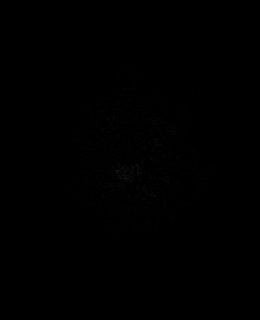

[Series 10: swi_images · axial · 3.0mm · 0.75mm/px · z∈[-94,+81]mm · 3 of 60 slices shown]
[im 1/60]
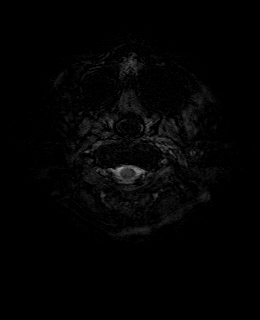
[im 30/60]
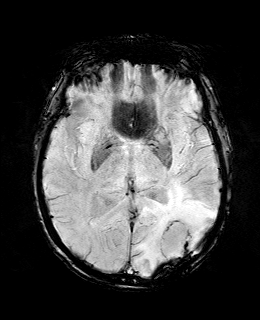
[im 60/60]
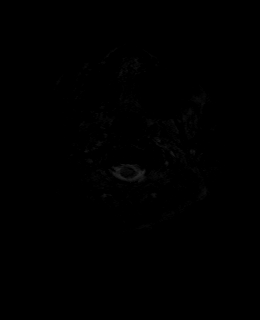

[Series 11: FLAIR · axial · 3.0mm · 0.75mm/px · z∈[-83,+71]mm · 3 of 53 slices shown]
[im 1/53]
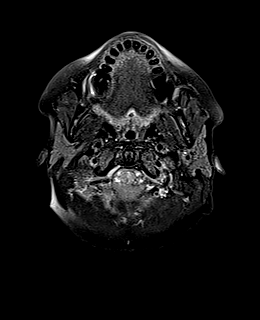
[im 27/53]
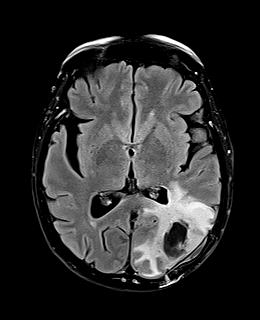
[im 53/53]
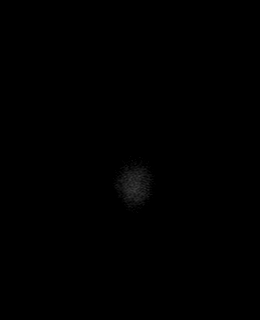

[Series 12: T1 · axial · 1.0mm · 0.94mm/px · z∈[-92,+81]mm · 9 of 176 slices shown (2 of 2)]
[im 1/176]
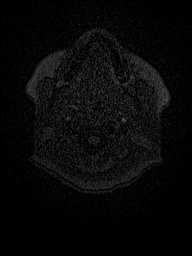
[im 22/176]
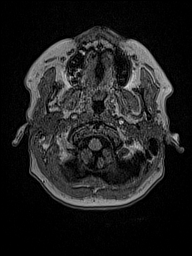
[im 44/176]
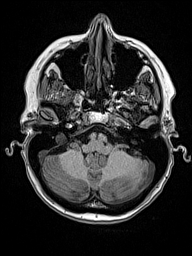
[im 66/176]
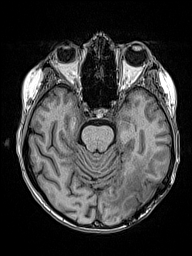
[im 88/176]
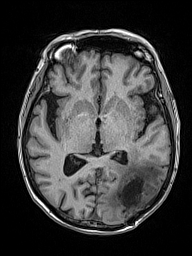
[im 110/176]
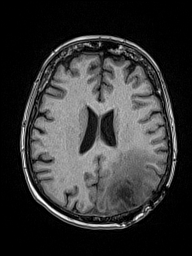
[im 132/176]
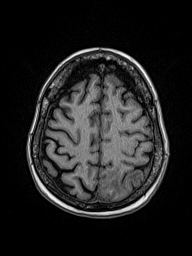
[im 154/176]
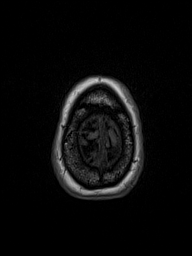
[im 176/176]
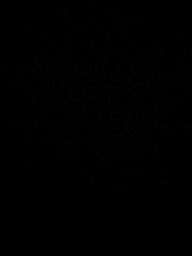

[Series 13: DWI · coronal · 5.0mm · 1.31mm/px · 4 of 72 slices shown (3 of 4)]
[im 1/72]
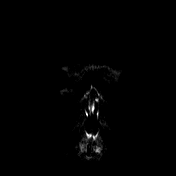
[im 24/72]
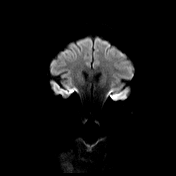
[im 48/72]
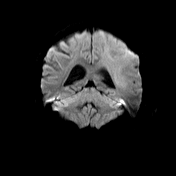
[im 72/72]
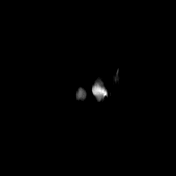

[Series 14: DWI · coronal · 5.0mm · 1.31mm/px · 2 of 36 slices shown (4 of 4)]
[im 1/36]
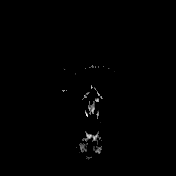
[im 36/36]
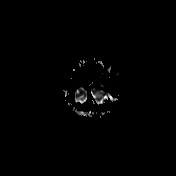

[Series 15: T2 post-contrast · coronal · 5.0mm · 0.57mm/px · 1 of 28 slices shown]
[im 1/28]
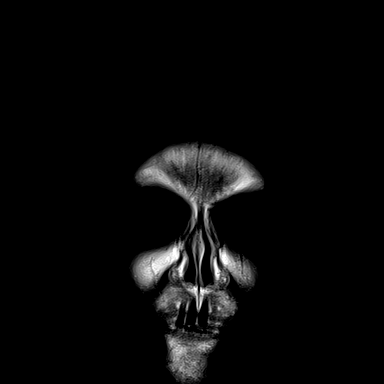

[Series 16: T1 post-contrast · axial · 1.0mm · 0.94mm/px · z∈[-92,+81]mm · 9 of 176 slices shown (1 of 3)]
[im 1/176]
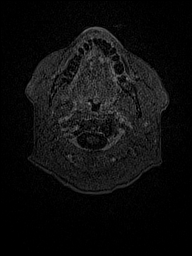
[im 22/176]
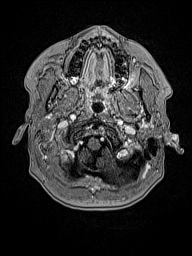
[im 44/176]
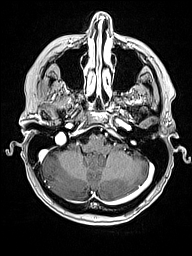
[im 66/176]
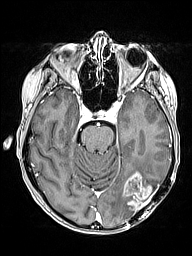
[im 88/176]
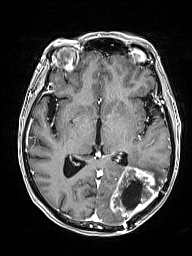
[im 110/176]
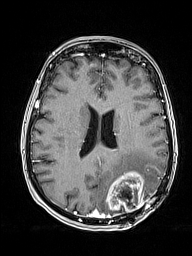
[im 132/176]
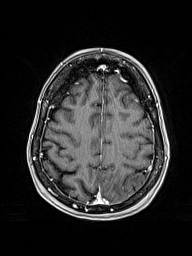
[im 154/176]
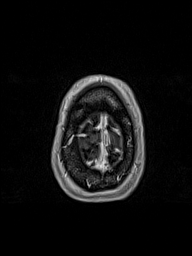
[im 176/176]
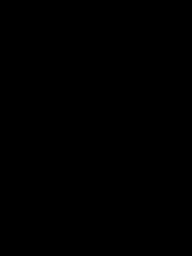

[Series 17: T1 post-contrast · coronal · 5.0mm · 0.43mm/px · 1 of 28 slices shown (2 of 3)]
[im 1/28]
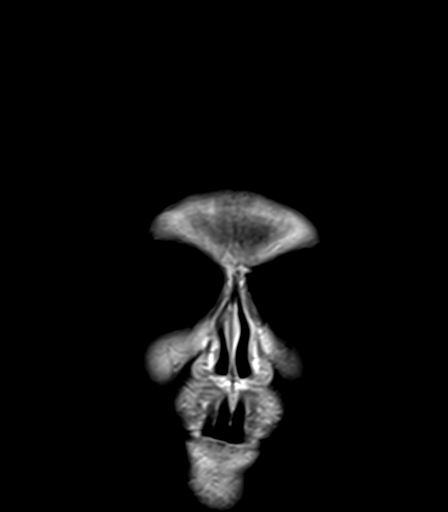

[Series 18: T1 post-contrast · sagittal · 5.0mm · 0.75mm/px · 1 of 24 slices shown (3 of 3)]
[im 1/24]
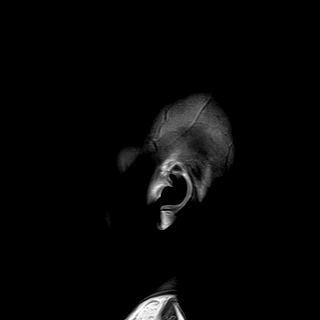

[48 of 48 positions shown; findings below may reference images not displayed]

FINDINGS: Brain: Mild interval enlargement of a large enhancing mass in the
left occipital parietal lobe. There is extensive central necrosis
with peripheral enhancement. There is mild chronic hemorrhage within
the cystic portion of the mass. The mass now measures 57 x 35 by 50
mm. There is enhancing tumor extending down to the atrium of the
left ventricle. There is ependymal enhancement in this area which
was also present previously compatible with tumor spread.
Surrounding nonenhancing white matter hyperintensity also has
progressed in the interval.

Ventricle size normal. No midline shift. Negative for acute infarct.
No significant chronic ischemia.

Vascular: Normal arterial flow voids.

Skull and upper cervical spine: Left parieto occipital craniotomy.
No acute skeletal abnormality.

Sinuses/Orbits: Negative

Other: None
IMPRESSION: Enhancing mass lesion left occipital parietal lobe shows mild
interval enlargement. Tumor continues to extend down to the atrium
of left lateral ventricle with enhancement of the ependyma
compatible with tumor spread. There is progression of nonenhancing
T2 hyperintensity surrounding the tumor.

## 2019-05-26 MED ORDER — GADOBUTROL 1 MMOL/ML IV SOLN
5.0000 mL | Freq: Once | INTRAVENOUS | Status: AC | PRN
  Administered 2019-05-26: 5 mL via INTRAVENOUS

## 2019-05-29 ENCOUNTER — Inpatient Hospital Stay: Payer: BC Managed Care – PPO

## 2019-05-30 ENCOUNTER — Inpatient Hospital Stay: Payer: BC Managed Care – PPO | Admitting: Internal Medicine

## 2019-05-30 ENCOUNTER — Other Ambulatory Visit: Payer: Self-pay | Admitting: Internal Medicine

## 2019-05-30 ENCOUNTER — Inpatient Hospital Stay: Payer: BC Managed Care – PPO

## 2019-05-30 ENCOUNTER — Other Ambulatory Visit: Payer: Self-pay

## 2019-05-30 VITALS — BP 111/59 | HR 74 | Temp 98.7°F | Resp 18 | Ht 62.0 in | Wt 118.3 lb

## 2019-05-30 DIAGNOSIS — E039 Hypothyroidism, unspecified: Secondary | ICD-10-CM | POA: Diagnosis not present

## 2019-05-30 DIAGNOSIS — C714 Malignant neoplasm of occipital lobe: Secondary | ICD-10-CM | POA: Diagnosis not present

## 2019-05-30 DIAGNOSIS — Z79899 Other long term (current) drug therapy: Secondary | ICD-10-CM | POA: Diagnosis not present

## 2019-05-30 DIAGNOSIS — Z9221 Personal history of antineoplastic chemotherapy: Secondary | ICD-10-CM | POA: Diagnosis not present

## 2019-05-30 DIAGNOSIS — Z7952 Long term (current) use of systemic steroids: Secondary | ICD-10-CM | POA: Diagnosis not present

## 2019-05-30 DIAGNOSIS — Z7982 Long term (current) use of aspirin: Secondary | ICD-10-CM | POA: Diagnosis not present

## 2019-05-30 DIAGNOSIS — C719 Malignant neoplasm of brain, unspecified: Secondary | ICD-10-CM

## 2019-05-30 DIAGNOSIS — E7800 Pure hypercholesterolemia, unspecified: Secondary | ICD-10-CM | POA: Diagnosis not present

## 2019-05-30 DIAGNOSIS — Z923 Personal history of irradiation: Secondary | ICD-10-CM | POA: Diagnosis not present

## 2019-05-30 LAB — CMP (CANCER CENTER ONLY)
ALT: 22 U/L (ref 0–44)
AST: 15 U/L (ref 15–41)
Albumin: 3.6 g/dL (ref 3.5–5.0)
Alkaline Phosphatase: 66 U/L (ref 38–126)
Anion gap: 7 (ref 5–15)
BUN: 28 mg/dL — ABNORMAL HIGH (ref 8–23)
CO2: 28 mmol/L (ref 22–32)
Calcium: 9 mg/dL (ref 8.9–10.3)
Chloride: 107 mmol/L (ref 98–111)
Creatinine: 0.9 mg/dL (ref 0.44–1.00)
GFR, Est AFR Am: >60 mL/min (ref >60)
GFR, Estimated: >60 mL/min (ref >60)
Glucose, Bld: 88 mg/dL (ref 70–99)
Potassium: 4.1 mmol/L (ref 3.5–5.1)
Sodium: 142 mmol/L (ref 135–145)
Total Bilirubin: 0.4 mg/dL (ref 0.3–1.2)
Total Protein: 6.2 g/dL — ABNORMAL LOW (ref 6.5–8.1)

## 2019-05-30 LAB — CBC WITH DIFFERENTIAL (CANCER CENTER ONLY)
Abs Immature Granulocytes: 0.01 10*3/uL (ref 0.00–0.07)
Basophils Absolute: 0 10*3/uL (ref 0.0–0.1)
Basophils Relative: 0 %
Eosinophils Absolute: 0 10*3/uL (ref 0.0–0.5)
Eosinophils Relative: 1 %
HCT: 41.8 % (ref 36.0–46.0)
Hemoglobin: 14.2 g/dL (ref 12.0–15.0)
Immature Granulocytes: 0 %
Lymphocytes Relative: 28 %
Lymphs Abs: 1.9 10*3/uL (ref 0.7–4.0)
MCH: 30.6 pg (ref 26.0–34.0)
MCHC: 34 g/dL (ref 30.0–36.0)
MCV: 90.1 fL (ref 80.0–100.0)
Monocytes Absolute: 0.6 10*3/uL (ref 0.1–1.0)
Monocytes Relative: 9 %
Neutro Abs: 4.3 10*3/uL (ref 1.7–7.7)
Neutrophils Relative %: 62 %
Platelet Count: 134 10*3/uL — ABNORMAL LOW (ref 150–400)
RBC: 4.64 MIL/uL (ref 3.87–5.11)
RDW: 14.4 % (ref 11.5–15.5)
WBC Count: 6.8 10*3/uL (ref 4.0–10.5)
nRBC: 0 % (ref 0.0–0.2)

## 2019-05-30 NOTE — Progress Notes (Signed)
[facility] [place], [place] [address] [phone]   Interval Evaluation  Date of Service: 05/30/19 Patient Name: Jillian Gomez Patient MRN: [id number] Patient DOB: Nov 20, 1955 Provider: Ventura Sellers, MD  Identifying Statement:  Jillian Gomez is a 64 y.o. 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 Kathyrn Sheriff.  Path is GBM IDH-wt   01/23/2019 - 03/03/2019 Radiation Therapy   IMRT with concurrent Temozolomide 59m/m2   03/28/2019 -  Chemotherapy   The patient had [No matching medication found in this treatment plan]  for chemotherapy treatment.      Biomarkers:  MGMT Unknown.  IDH 1/2 Wild type.  EGFR Unknown  TERT Unknown   Interval History:  RDAYZHA POGOSYANpresents today for follow up after cycle #2 TMZ.  No problems tolerating therapy.  She describes continued stability regarding energy, and no return of confusion and disorientation from prior.  Continues on 440mdaily decadron, feels much improved on this compared to the 58m33mrom prior.  No further new or progressive deficits, no headaches or seizures.  Remains functionally independent as prior.  H+P (01/09/19) Patient presented to medical attention in early December with ~2 months history of right sided visual impairment.  She describes fuzzy or blurry vision on that side which was progressive over time, leading to at least one fall.  MRI brain demonstrated enhancing left occipital mass; this was subsequently resected by Dr. NunKathyrn Sheriff 12/23/18.  She had no issues with surgery and has completed her steroid taper.  Continues on keppra daily but no history of any seizure. She has returned to work with only modest difficutly.  No issues walking or performing ADLs; lives alone but her sister and other family  members are nearby.  Medications: Current Outpatient Medications on File Prior to Visit  Medication Sig Dispense Refill  . aspirin 81 MG chewable tablet Chew 1 tablet (81 mg total) by mouth daily.    . aMarland Kitchenorvastatin (LIPITOR) 40 MG tablet Take 40 mg by mouth daily.    . Biotin 1 MG CAPS Take 1 mg by mouth daily.    . CMarland KitchenLCIUM PO Take 1 tablet by mouth daily.    . dMarland Kitchenxamethasone (DECADRON) 2 MG tablet Take 2 tablets (4 mg total) by mouth daily. 60 tablet 1  . EUTHYROX 50 MCG tablet Take 50 mcg by mouth daily.    . naproxen sodium (ALEVE) 220 MG tablet Take 1 tablet (220 mg total) by mouth daily as needed (pain). (Patient not taking: Reported on 05/30/2019)    . ondansetron (ZOFRAN) 8 MG tablet Take 1 tablet (8 mg total) by mouth 2 (two) times daily as needed (nausea and vomiting). May take 30-60 minutes prior to Temodar administration if nausea/vomiting occurs. (Patient not taking: Reported on 05/02/2019) 30 tablet 1  . temozolomide (TEMODAR) 100 MG capsule Take 3 capsules (300 mg total) by mouth daily. May take on an empty stomach to decrease nausea & vomiting. (Patient not taking: Reported on 05/30/2019) 15 capsule 0   No current facility-administered medications on file prior to visit.    Allergies:  Allergies  Allergen Reactions  . Etodolac Other (See Comments)    GI upset  . Macrobid [Nitrofurantoin] Rash   Past Medical History:  Past Medical History:  Diagnosis Date  . High cholesterol   . Hypothyroidism   .  Joint pain    Past Surgical History:  Past Surgical History:  Procedure Laterality Date  . APPLICATION OF CRANIAL NAVIGATION Left 12/23/2018   Procedure: APPLICATION OF CRANIAL NAVIGATION;  Surgeon: Consuella Lose, MD;  Location: [facility];  Service: Neurosurgery;  Laterality: Left;  APPLICATION OF CRANIAL NAVIGATION  . BREAST BIOPSY Left   . COLONOSCOPY    . CRANIOTOMY Left 12/23/2018   Procedure: STEREOTACTIC LEFT OCCIPITAL CRANIOTOMY FOR RESECTION OF TUMOR;  Surgeon:  Consuella Lose, MD;  Location: [facility];  Service: Neurosurgery;  Laterality: Left;  STEREOTACTIC LEFT OCCIPITAL CRANIOTOMY FOR RESECTION OF TUMOR  . LASIK     Social History:  Social History   Socioeconomic History  . Marital status: Single    Spouse name: Not on file  . Number of children: Not on file  . Years of education: Not on file  . Highest education level: Not on file  Occupational History  . Not on file  Tobacco Use  . Smoking status: Never Smoker  . Smokeless tobacco: Never Used  Substance and Sexual Activity  . Alcohol use: Not Currently  . Drug use: Never  . Sexual activity: Not on file  Other Topics Concern  . Not on file  Social History Narrative  . Not on file   Social Determinants of Health   Financial Resource Strain:   . Difficulty of Paying Living Expenses:   Food Insecurity:   . Worried About Charity fundraiser in the Last Year:   . Arboriculturist in the Last Year:   Transportation Needs:   . Film/video editor (Medical):   Marland Kitchen Lack of Transportation (Non-Medical):   Physical Activity:   . Days of Exercise per Week:   . Minutes of Exercise per Session:   Stress:   . Feeling of Stress :   Social Connections:   . Frequency of Communication with Friends and Family:   . Frequency of Social Gatherings with Friends and Family:   . Attends Religious Services:   . Active Member of Clubs or Organizations:   . Attends Archivist Meetings:   Marland Kitchen Marital Status:   Intimate Partner Violence:   . Fear of Current or Ex-Partner:   . Emotionally Abused:   Marland Kitchen Physically Abused:   . Sexually Abused:    Family History: No family history on file.  Review of Systems: Constitutional: Denies fevers, chills or abnormal weight loss Eyes: Denies blurriness of vision Gastrointestinal:  Denies nausea, constipation, diarrhea GU: Denies dysuria or incontinence Skin: Denies abnormal skin rashes Musculoskeletal: Denies joint pain, back or neck discomfort.   Behavioral/Psych: Denies anxiety, mood instability  Physical Exam: Vitals:   05/30/19 1012  BP: (!) 111/59  Pulse: 74  Resp: 18  Temp: 98.7 F (37.1 C)  SpO2: 100%   KPS: 80. General: Alert, cooperative, pleasant, in no acute distress Head: Craniotomy scar noted, dry and intact. EENT: No conjunctival injection or scleral icterus. Oral mucosa moist Lungs: Resp effort normal Cardiac: Regular rate and rhythm Abdomen: Soft, non-distended abdomen Skin: No rashes cyanosis or petechiae. Extremities: No clubbing or edema  Neurologic Exam: Mental Status: Awake, alert, attentive to examiner. Oriented to self and environment. Language is fluent with intact comprehension.  Cranial Nerves: Visual acuity is grossly normal. Right homonymous hemianopia. Extra-ocular movements intact. No ptosis. Face is symmetric, tongue midline. Motor: Tone and bulk are normal. Power is full in both arms and legs. Reflexes are symmetric, no pathologic reflexes present. Intact finger to  nose bilaterally Sensory: Intact to light touch and temperature Gait: Normal and tandem gait is deferred.   Labs: I have reviewed the data as listed    Component Value Date/Time   NA 143 05/02/2019 0948   K 3.9 05/02/2019 0948   CL 106 05/02/2019 0948   CO2 27 05/02/2019 0948   GLUCOSE 83 05/02/2019 0948   BUN 18 05/02/2019 0948   CREATININE 0.87 05/02/2019 0948   CALCIUM 9.2 05/02/2019 0948   PROT 6.7 05/02/2019 0948   ALBUMIN 3.7 05/02/2019 0948   AST 16 05/02/2019 0948   ALT 18 05/02/2019 0948   ALKPHOS 71 05/02/2019 0948   BILITOT 0.5 05/02/2019 0948   GFRNONAA >60 05/02/2019 0948   GFRAA >60 05/02/2019 0948   Lab Results  Component Value Date   WBC 7.3 05/02/2019   NEUTROABS 4.5 05/02/2019   HGB 14.4 05/02/2019   HCT 43.6 05/02/2019   MCV 89.3 05/02/2019   PLT 241 05/02/2019   Imaging:  [facility] Clinician Interpretation: I have personally reviewed the CNS images as listed.  My interpretation, in the  context of the patient's clinical presentation, is treatment effect vs true progression  MR BRAIN W WO CONTRAST  Result Date: 05/26/2019 CLINICAL DATA:  Glioblastoma IDH wild type.  Postop resection EXAM: MRI HEAD WITHOUT AND WITH CONTRAST TECHNIQUE: Multiplanar, multiecho pulse sequences of the brain and surrounding structures were obtained without and with intravenous contrast. CONTRAST:  69m GADAVIST GADOBUTROL 1 MMOL/ML IV SOLN COMPARISON:  MRI head 03/24/2019 FINDINGS: Brain: Mild interval enlargement of a large enhancing mass in the left occipital parietal lobe. There is extensive central necrosis with peripheral enhancement. There is mild chronic hemorrhage within the cystic portion of the mass. The mass now measures 57 x 35 by 50 mm. There is enhancing tumor extending down to the atrium of the left ventricle. There is ependymal enhancement in this area which was also present previously compatible with tumor spread. Surrounding nonenhancing white matter hyperintensity also has progressed in the interval. Ventricle size normal. No midline shift. Negative for acute infarct. No significant chronic ischemia. Vascular: Normal arterial flow voids. Skull and upper cervical spine: Left parieto occipital craniotomy. No acute skeletal abnormality. Sinuses/Orbits: Negative Other: None IMPRESSION: Enhancing mass lesion left occipital parietal lobe shows mild interval enlargement. Tumor continues to extend down to the atrium of left lateral ventricle with enhancement of the ependyma compatible with tumor spread. There is progression of nonenhancing T2 hyperintensity surrounding the tumor. Electronically Signed   By: CFranchot GalloM.D.   On: 05/26/2019 13:25     Assessment/Plan Glioblastoma with isocitrate dehydrogenase gene wildtype ([place] [C71.9]   RSHANECA ORNEis clinically stable today following cycle #2 of 5-day Temodar.  MRI demonstrates localized tumor growth which is either pseudoprogression or organic  tumor progression.  We recommended continuing treatment with Temozolomide, with cycle #3 200 mg/m2, on for five days and off for twenty three days in twenty eight day cycles. The patient will have a complete blood count performed on days 21 and 28 of each cycle, and a comprehensive metabolic panel performed on day 28 of each cycle. Labs may need to be performed more often. Zofran will prescribed for home use for nausea/vomiting.   Chemotherapy should be held for the following:  ANC less than 1,000  Platelets less than 100,000  LFT or creatinine greater than 2x ULN  If clinical concerns/contraindications develop  We recommended decreasing decadron to 379mdaily once complete dosing chemo this month.  She should follow up in 1 month with MRI brain for evaluation, given progressive changes seen today, prior to cycle #3.  All questions were answered. The patient knows to call the clinic with any problems, questions or concerns. No barriers to learning were detected.  The total time spent in the encounter was 40 minutes and more than 50% was on counseling and review of test results   Ventura Sellers, MD Medical Director of Neuro-Oncology [facility] at [place] 05/30/19 10:17 AM

## 2019-05-31 ENCOUNTER — Telehealth: Payer: Self-pay | Admitting: Internal Medicine

## 2019-05-31 MED FILL — TEMOZOLOMIDE 100 MG CAPS: 100 | 5 days supply | Qty: 15 | Fill #0

## 2019-05-31 NOTE — Telephone Encounter (Signed)
Scheduled appt per 5/25 los.  Spoke with pt and they are aware of their appt date and time 

## 2019-06-06 ENCOUNTER — Other Ambulatory Visit: Payer: Self-pay | Admitting: *Deleted

## 2019-06-06 DIAGNOSIS — C719 Malignant neoplasm of brain, unspecified: Secondary | ICD-10-CM

## 2019-06-26 ENCOUNTER — Other Ambulatory Visit: Payer: Self-pay | Admitting: *Deleted

## 2019-06-30 ENCOUNTER — Other Ambulatory Visit: Payer: Self-pay

## 2019-06-30 ENCOUNTER — Ambulatory Visit (HOSPITAL_COMMUNITY)
Admission: RE | Admit: 2019-06-30 | Discharge: 2019-06-30 | Disposition: A | Discharge status: Home / Self Care | Payer: BC Managed Care – PPO | Source: Ambulatory Visit | Attending: Internal Medicine | Admitting: Internal Medicine

## 2019-06-30 DIAGNOSIS — C719 Malignant neoplasm of brain, unspecified: Secondary | ICD-10-CM | POA: Insufficient documentation

## 2019-06-30 IMAGING — MR MR HEAD WO/W CM
14 series · 48 of 48 positions shown · IV contrast (gadavist)
Comparison: [DATE]

CLINICAL DATA: Follow-up glioblastoma status post resection in
[DATE] and radiation therapy [DATE]. ongoing
treatment with Temodar.

EXAM:
MRI HEAD WITHOUT AND WITH CONTRAST
TECHNIQUE: Multiplanar, multiecho pulse sequences of the brain and surrounding
structures were obtained without and with intravenous contrast.
CONTRAST:  5mL GADAVIST GADOBUTROL 1 MMOL/ML IV SOLN

[Series 5: DWI · axial · 3.0mm · 1.36mm/px · z∈[-78,+54]mm · 5 of 96 slices shown (1 of 4)]
[im 1/96]
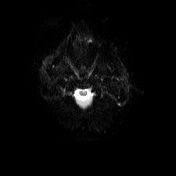
[im 24/96]
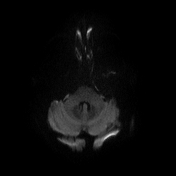
[im 48/96]
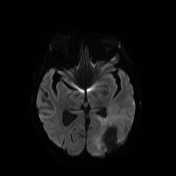
[im 72/96]
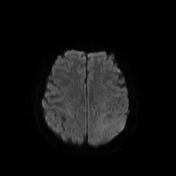
[im 96/96]
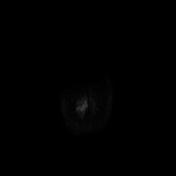

[Series 6: DWI · axial · 3.0mm · 1.36mm/px · z∈[-78,+54]mm · 3 of 48 slices shown (2 of 4)]
[im 1/48]
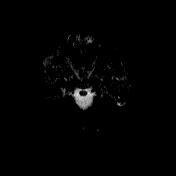
[im 24/48]
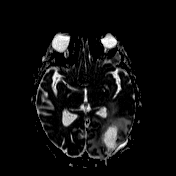
[im 48/48]
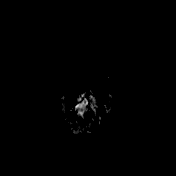

[Series 7: T1 · sagittal · 5.0mm · 0.75mm/px · 1 of 22 slices shown (1 of 2)]
[im 1/22]
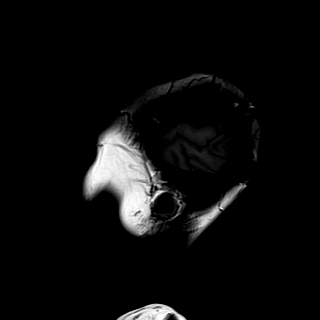

[Series 8: T2 · axial · 5.0mm · 0.62mm/px · z∈[-65,+94]mm · 2 of 26 slices shown]
[im 1/26]
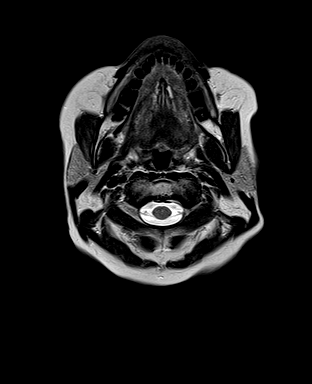
[im 26/26]
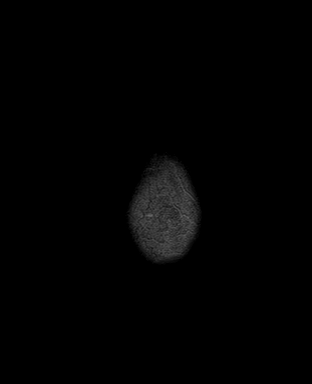

[Series 9: mip_images(sw) · axial · 24.0mm · 0.75mm/px · z∈[-62,+91]mm · 3 of 53 slices shown]
[im 1/53]
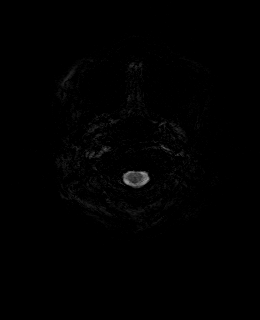
[im 27/53]
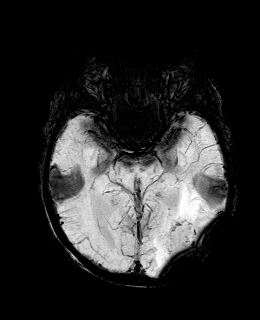
[im 53/53]
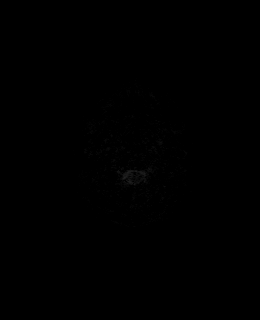

[Series 10: swi_images · axial · 3.0mm · 0.75mm/px · z∈[-73,+101]mm · 4 of 60 slices shown]
[im 1/60]
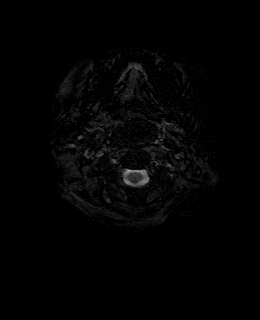
[im 20/60]
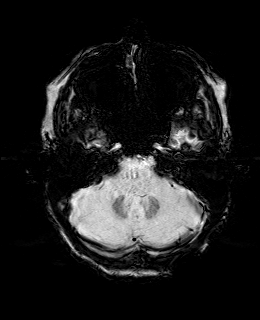
[im 40/60]
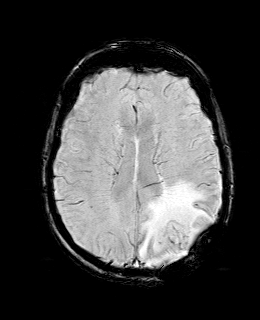
[im 60/60]
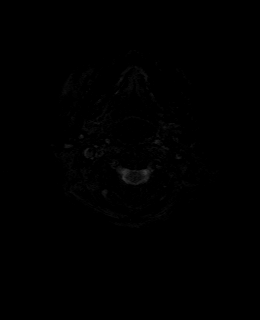

[Series 11: FLAIR · axial · 3.0mm · 0.75mm/px · z∈[-61,+90]mm · 3 of 52 slices shown]
[im 1/52]
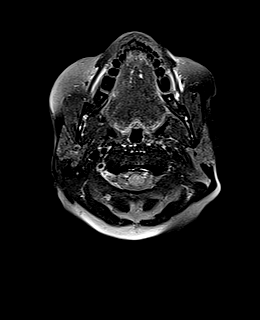
[im 26/52]
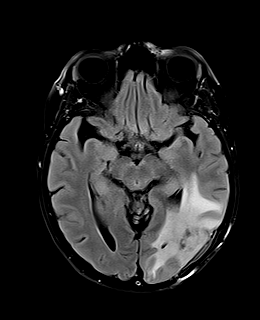
[im 52/52]
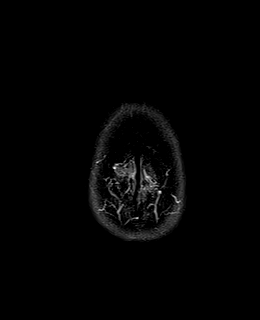

[Series 12: T1 · axial · 1.0mm · 0.94mm/px · z∈[-49,+92]mm · 8 of 144 slices shown (2 of 2)]
[im 1/144]
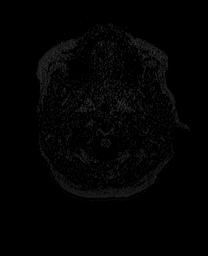
[im 21/144]
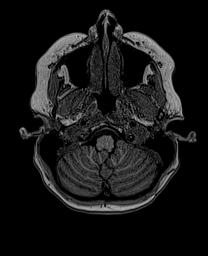
[im 41/144]
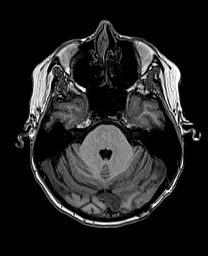
[im 62/144]
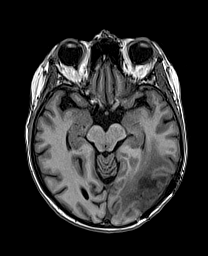
[im 82/144]
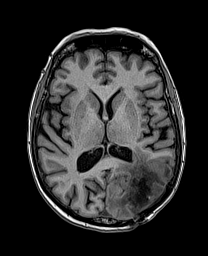
[im 103/144]
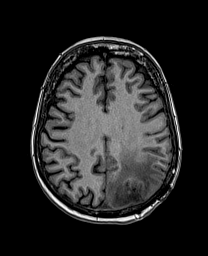
[im 123/144]
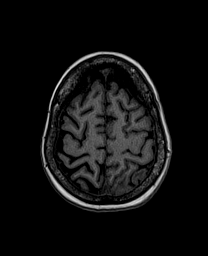
[im 144/144]
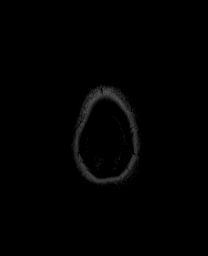

[Series 13: DWI · coronal · 5.0mm · 1.31mm/px · 4 of 64 slices shown (3 of 4)]
[im 1/64]
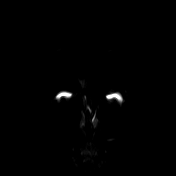
[im 22/64]
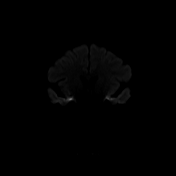
[im 43/64]
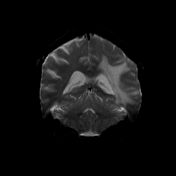
[im 64/64]
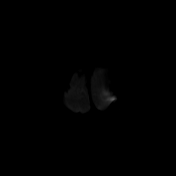

[Series 14: DWI · coronal · 5.0mm · 1.31mm/px · 2 of 32 slices shown (4 of 4)]
[im 1/32]
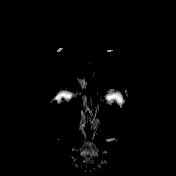
[im 32/32]
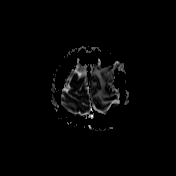

[Series 15: T2 post-contrast · coronal · 5.0mm · 0.57mm/px · 2 of 28 slices shown]
[im 1/28]
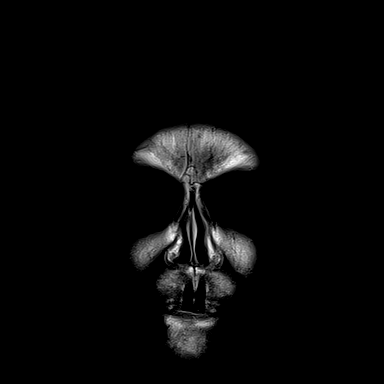
[im 28/28]
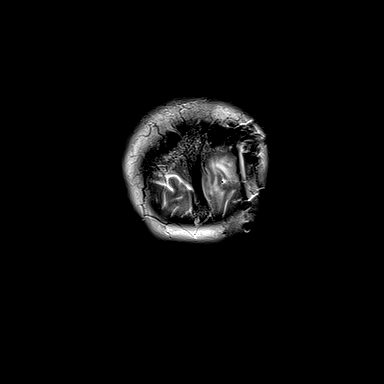

[Series 16: T1 post-contrast · axial · 1.0mm · 0.94mm/px · z∈[-49,+92]mm · 8 of 144 slices shown (1 of 3)]
[im 1/144]
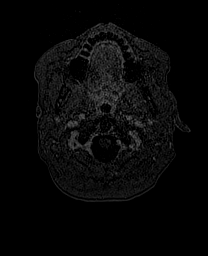
[im 21/144]
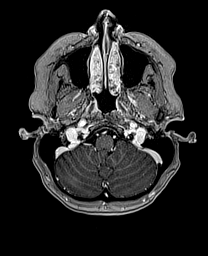
[im 41/144]
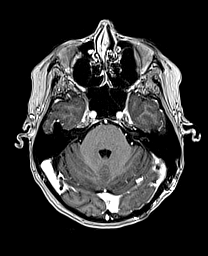
[im 62/144]
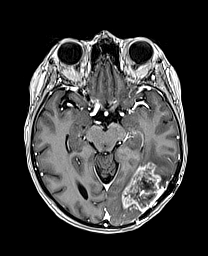
[im 82/144]
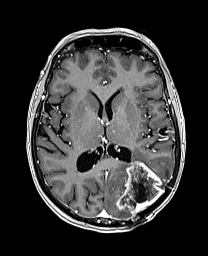
[im 103/144]
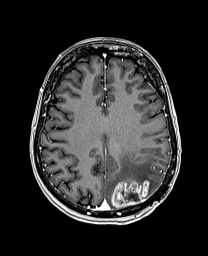
[im 123/144]
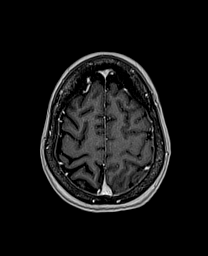
[im 144/144]
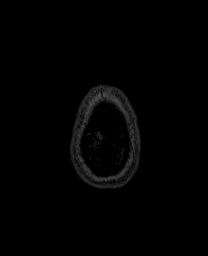

[Series 17: T1 post-contrast · coronal · 5.0mm · 0.43mm/px · 2 of 28 slices shown (2 of 3)]
[im 1/28]
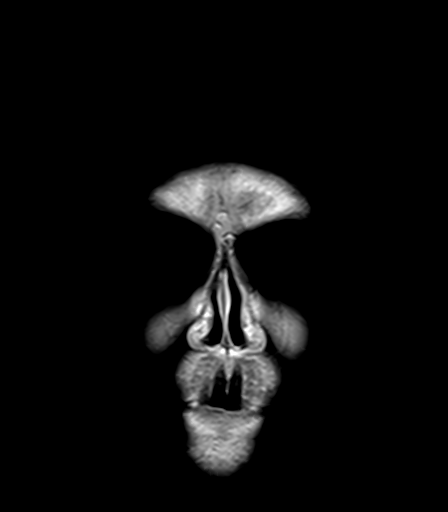
[im 28/28]
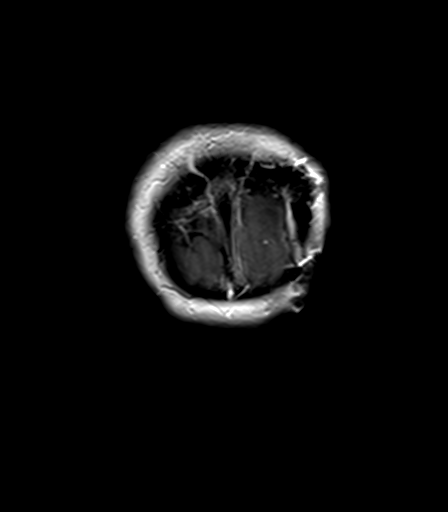

[Series 18: T1 post-contrast · sagittal · 5.0mm · 0.75mm/px · 1 of 22 slices shown (3 of 3)]
[im 1/22]
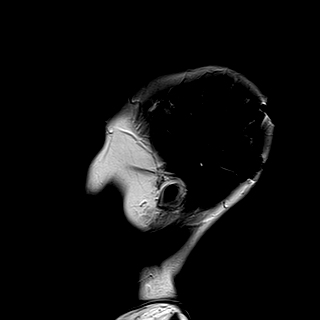

[48 of 48 positions shown; findings below may reference images not displayed]

FINDINGS: Brain: Sequelae of left occipital craniotomy and tumor resection are
again identified. A heterogeneous left parieto-occipital mass with
thick irregular peripheral enhancement measures 6.7 x 4.4 x 5.1 cm,
not significantly changed upon remeasurement. The mass is again
noted to extend to the atrium of the left lateral ventricle where
there is a small amount of unchanged ependymal enhancement.
Extensive nonenhancing T2 hyperintensity in the left
parieto-occipital white matter extending into the posterior temporal
lobe is unchanged with unchanged mass effect on the left lateral
ventricle but no midline shift. A small amount of chronic blood
products are again noted within the mass. No abnormal enhancement is
identified remote from the mass. Mild dural thickening subjacent to
the craniotomy is likely postoperative.

No acute infarct or hydrocephalus is identified. There is unchanged
trace extra-axial fluid subjacent to the craniotomy. No significant
background chronic small vessel disease is evident in the cerebral
white matter.

Vascular: Major intracranial vascular flow voids are preserved.

Skull and upper cervical spine: Left occipital craniotomy. No
suspicious marrow lesion.

Sinuses/Orbits: Unremarkable orbits. Paranasal sinuses and mastoid
air cells are clear.

Other: None.
IMPRESSION: Unchanged enhancing left parieto-occipital mass and surrounding
edema.

## 2019-06-30 MED ORDER — GADOBUTROL 1 MMOL/ML IV SOLN
5.0000 mL | Freq: Once | INTRAVENOUS | Status: AC | PRN
  Administered 2019-06-30: 5 mL via INTRAVENOUS

## 2019-07-03 ENCOUNTER — Ambulatory Visit: Payer: BC Managed Care – PPO | Admitting: Internal Medicine

## 2019-07-03 ENCOUNTER — Telehealth: Payer: Self-pay | Admitting: *Deleted

## 2019-07-03 ENCOUNTER — Inpatient Hospital Stay: Payer: BC Managed Care – PPO | Attending: Internal Medicine

## 2019-07-03 DIAGNOSIS — Z79899 Other long term (current) drug therapy: Secondary | ICD-10-CM | POA: Insufficient documentation

## 2019-07-03 DIAGNOSIS — E7800 Pure hypercholesterolemia, unspecified: Secondary | ICD-10-CM | POA: Insufficient documentation

## 2019-07-03 DIAGNOSIS — Z9221 Personal history of antineoplastic chemotherapy: Secondary | ICD-10-CM | POA: Insufficient documentation

## 2019-07-03 DIAGNOSIS — E039 Hypothyroidism, unspecified: Secondary | ICD-10-CM | POA: Insufficient documentation

## 2019-07-03 DIAGNOSIS — Z7952 Long term (current) use of systemic steroids: Secondary | ICD-10-CM | POA: Insufficient documentation

## 2019-07-03 DIAGNOSIS — Z923 Personal history of irradiation: Secondary | ICD-10-CM | POA: Insufficient documentation

## 2019-07-03 DIAGNOSIS — Z7982 Long term (current) use of aspirin: Secondary | ICD-10-CM | POA: Insufficient documentation

## 2019-07-03 DIAGNOSIS — C714 Malignant neoplasm of occipital lobe: Secondary | ICD-10-CM | POA: Insufficient documentation

## 2019-07-04 ENCOUNTER — Inpatient Hospital Stay: Payer: BC Managed Care – PPO | Admitting: Internal Medicine

## 2019-07-04 ENCOUNTER — Inpatient Hospital Stay: Payer: BC Managed Care – PPO

## 2019-07-04 ENCOUNTER — Other Ambulatory Visit: Payer: Self-pay

## 2019-07-04 VITALS — BP 128/76 | HR 76 | Temp 97.7°F | Resp 18 | Ht 62.0 in | Wt 119.5 lb

## 2019-07-04 DIAGNOSIS — Z7952 Long term (current) use of systemic steroids: Secondary | ICD-10-CM | POA: Diagnosis not present

## 2019-07-04 DIAGNOSIS — E7800 Pure hypercholesterolemia, unspecified: Secondary | ICD-10-CM | POA: Diagnosis not present

## 2019-07-04 DIAGNOSIS — C719 Malignant neoplasm of brain, unspecified: Secondary | ICD-10-CM | POA: Diagnosis not present

## 2019-07-04 DIAGNOSIS — E039 Hypothyroidism, unspecified: Secondary | ICD-10-CM | POA: Diagnosis not present

## 2019-07-04 DIAGNOSIS — C714 Malignant neoplasm of occipital lobe: Secondary | ICD-10-CM | POA: Diagnosis not present

## 2019-07-04 DIAGNOSIS — Z9221 Personal history of antineoplastic chemotherapy: Secondary | ICD-10-CM | POA: Diagnosis not present

## 2019-07-04 DIAGNOSIS — Z79899 Other long term (current) drug therapy: Secondary | ICD-10-CM | POA: Diagnosis not present

## 2019-07-04 DIAGNOSIS — Z7982 Long term (current) use of aspirin: Secondary | ICD-10-CM | POA: Diagnosis not present

## 2019-07-04 DIAGNOSIS — Z923 Personal history of irradiation: Secondary | ICD-10-CM | POA: Diagnosis not present

## 2019-07-04 LAB — CMP (CANCER CENTER ONLY)
ALT: 25 U/L (ref 0–44)
AST: 19 U/L (ref 15–41)
Albumin: 3.8 g/dL (ref 3.5–5.0)
Alkaline Phosphatase: 60 U/L (ref 38–126)
Anion gap: 11 (ref 5–15)
BUN: 26 mg/dL — ABNORMAL HIGH (ref 8–23)
CO2: 26 mmol/L (ref 22–32)
Calcium: 9.2 mg/dL (ref 8.9–10.3)
Chloride: 105 mmol/L (ref 98–111)
Creatinine: 1.04 mg/dL — ABNORMAL HIGH (ref 0.44–1.00)
GFR, Est AFR Am: >60 mL/min (ref >60)
GFR, Estimated: 57 mL/min — ABNORMAL LOW (ref >60)
Glucose, Bld: 83 mg/dL (ref 70–99)
Potassium: 3.4 mmol/L — ABNORMAL LOW (ref 3.5–5.1)
Sodium: 142 mmol/L (ref 135–145)
Total Bilirubin: 0.8 mg/dL (ref 0.3–1.2)
Total Protein: 6.6 g/dL (ref 6.5–8.1)

## 2019-07-04 LAB — CBC WITH DIFFERENTIAL (CANCER CENTER ONLY)
Abs Immature Granulocytes: 0.02 10*3/uL (ref 0.00–0.07)
Basophils Absolute: 0 10*3/uL (ref 0.0–0.1)
Basophils Relative: 0 %
Eosinophils Absolute: 0 10*3/uL (ref 0.0–0.5)
Eosinophils Relative: 1 %
HCT: 39.3 % (ref 36.0–46.0)
Hemoglobin: 13.6 g/dL (ref 12.0–15.0)
Immature Granulocytes: 0 %
Lymphocytes Relative: 25 %
Lymphs Abs: 1.6 10*3/uL (ref 0.7–4.0)
MCH: 32.5 pg (ref 26.0–34.0)
MCHC: 34.6 g/dL (ref 30.0–36.0)
MCV: 94 fL (ref 80.0–100.0)
Monocytes Absolute: 0.6 10*3/uL (ref 0.1–1.0)
Monocytes Relative: 9 %
Neutro Abs: 4 10*3/uL (ref 1.7–7.7)
Neutrophils Relative %: 65 %
Platelet Count: 185 10*3/uL (ref 150–400)
RBC: 4.18 MIL/uL (ref 3.87–5.11)
RDW: 15.2 % (ref 11.5–15.5)
WBC Count: 6.2 10*3/uL (ref 4.0–10.5)
nRBC: 0 % (ref 0.0–0.2)

## 2019-07-04 MED ORDER — TEMOZOLOMIDE 100 MG PO CAPS: 300.0000 mg | ORAL_CAPSULE | Freq: Every day | ORAL | 0 refills | Status: DC

## 2019-07-04 NOTE — Progress Notes (Signed)
[facility] at [address] [place], [place] [address] [phone]   Interval Evaluation  Date of Service: 07/04/19 Patient Name: Jillian Gomez Patient MRN: [id number] Patient DOB: 30-Mar-1955 Provider: Ventura Sellers, MD  Identifying Statement:  Jillian Gomez is a 64 y.o. female with left occipital glioblastoma   Oncologic History: Oncology History  Glioblastoma with isocitrate dehydrogenase gene wildtype ([place])  12/23/2018 Surgery   Craniotomy, left occipital resection by Dr. Kathyrn Sheriff.  Path is GBM IDH-wt   01/23/2019 - 03/03/2019 Radiation Therapy   IMRT with concurrent Temozolomide 60m/m2   03/28/2019 -  Chemotherapy   The patient had [No matching medication found in this treatment plan]  for chemotherapy treatment.      Biomarkers:  MGMT Unknown.  IDH 1/2 Wild type.  EGFR Unknown  TERT Unknown   Interval History:  Jillian BJORKLUNDpresents today for follow up after cycle #3 TMZ.  No problems tolerating therapy, although has had a difficult month grieving the loss of her mother.  Describes no return of confusion and disorientation from prior.  Has remained on 475mdaily decadron.  No further new or progressive deficits, no headaches or seizures.  Remains functionally independent as prior.  H+P (01/09/19) Patient presented to medical attention in early December with ~2 months history of right sided visual impairment.  She describes fuzzy or blurry vision on that side which was progressive over time, leading to at least one fall.  MRI brain demonstrated enhancing left occipital mass; this was subsequently resected by Dr. [name] 12/23/18.  She had no issues with surgery and has completed her steroid taper.  Continues on keppra daily but no history of any seizure. She has returned to work with only modest difficutly.  No issues walking or performing ADLs; lives alone but her sister and other family members are  nearby.  Medications: Current Outpatient Medications on File Prior to Visit  Medication Sig Dispense Refill  . Biotin 1 MG CAPS Take 1 mg by mouth daily.    . Marland KitchenALCIUM PO Take 1 tablet by mouth daily.    . Marland Kitchenexamethasone (DECADRON) 2 MG tablet Take 2 tablets (4 mg total) by mouth daily. 60 tablet 1  . EUTHYROX 50 MCG tablet Take 50 mcg by mouth daily.    . Marland Kitchenspirin 81 MG chewable tablet Chew 1 tablet (81 mg total) by mouth daily. (Patient not taking: Reported on 07/04/2019)    . atorvastatin (LIPITOR) 40 MG tablet Take 40 mg by mouth daily. (Patient not taking: Reported on 07/04/2019)    . naproxen sodium (ALEVE) 220 MG tablet Take 1 tablet (220 mg total) by mouth daily as needed (pain). (Patient not taking: Reported on 05/30/2019)    . ondansetron (ZOFRAN) 8 MG tablet Take 1 tablet (8 mg total) by mouth 2 (two) times daily as needed (nausea and vomiting). May take 30-60 minutes prior to Temodar administration if nausea/vomiting occurs. (Patient not taking: Reported on 05/02/2019) 30 tablet 1  . temozolomide (TEMODAR) 100 MG capsule TAKE 3 CAPSULES (300 MG TOTAL) BY MOUTH DAILY. MAY TAKE ON AN EMPTY STOMACH TO DECREASE NAUSEA & VOMITING. (Patient not taking: Reported on 07/04/2019) 15 capsule 0   No current facility-administered medications on file prior to visit.    Allergies:  Allergies  Allergen Reactions  . Etodolac Other (See Comments)    GI upset  . Macrobid [Nitrofurantoin] Rash   Past Medical History:  Past Medical History:  Diagnosis Date  .  High cholesterol   . Hypothyroidism   . Joint pain    Past Surgical History:  Past Surgical History:  Procedure Laterality Date  . APPLICATION OF CRANIAL NAVIGATION Left 12/23/2018   Procedure: APPLICATION OF CRANIAL NAVIGATION;  Surgeon: Consuella Lose, MD;  Location: [facility];  Service: Neurosurgery;  Laterality: Left;  APPLICATION OF CRANIAL NAVIGATION  . BREAST BIOPSY Left   . COLONOSCOPY    . CRANIOTOMY Left 12/23/2018   Procedure:  STEREOTACTIC LEFT OCCIPITAL CRANIOTOMY FOR RESECTION OF TUMOR;  Surgeon: Consuella Lose, MD;  Location: [facility];  Service: Neurosurgery;  Laterality: Left;  STEREOTACTIC LEFT OCCIPITAL CRANIOTOMY FOR RESECTION OF TUMOR  . LASIK     Social History:  Social History   Socioeconomic History  . Marital status: Single    Spouse name: Not on file  . Number of children: Not on file  . Years of education: Not on file  . Highest education level: Not on file  Occupational History  . Not on file  Tobacco Use  . Smoking status: Never Smoker  . Smokeless tobacco: Never Used  Vaping Use  . Vaping Use: Never used  Substance and Sexual Activity  . Alcohol use: Not Currently  . Drug use: Never  . Sexual activity: Not on file  Other Topics Concern  . Not on file  Social History Narrative  . Not on file   Social Determinants of Health   Financial Resource Strain:   . Difficulty of Paying Living Expenses:   Food Insecurity:   . Worried About Charity fundraiser in the Last Year:   . Arboriculturist in the Last Year:   Transportation Needs:   . Film/video editor (Medical):   Marland Kitchen Lack of Transportation (Non-Medical):   Physical Activity:   . Days of Exercise per Week:   . Minutes of Exercise per Session:   Stress:   . Feeling of Stress :   Social Connections:   . Frequency of Communication with Friends and Family:   . Frequency of Social Gatherings with Friends and Family:   . Attends Religious Services:   . Active Member of Clubs or Organizations:   . Attends Archivist Meetings:   Marland Kitchen Marital Status:   Intimate Partner Violence:   . Fear of Current or Ex-Partner:   . Emotionally Abused:   Marland Kitchen Physically Abused:   . Sexually Abused:    Family History: No family history on file.  Review of Systems: Constitutional: Denies fevers, chills or abnormal weight loss Eyes: Denies blurriness of vision Gastrointestinal:  Denies nausea, constipation, diarrhea GU: Denies dysuria  or incontinence Skin: Denies abnormal skin rashes Musculoskeletal: Denies joint pain, back or neck discomfort.  Behavioral/Psych: Denies anxiety, mood instability  Physical Exam: Vitals:   07/04/19 1022  BP: 128/76  Pulse: 76  Resp: 18  Temp: 97.7 F (36.5 C)  SpO2: 100%   KPS: 80. General: Alert, cooperative, pleasant, in no acute distress Head: Craniotomy scar noted, dry and intact. EENT: No conjunctival injection or scleral icterus. Oral mucosa moist Lungs: Resp effort normal Cardiac: Regular rate and rhythm Abdomen: Soft, non-distended abdomen Skin: No rashes cyanosis or petechiae. Extremities: No clubbing or edema  Neurologic Exam: Mental Status: Awake, alert, attentive to examiner. Oriented to self and environment. Language is fluent with intact comprehension.  Cranial Nerves: Visual acuity is grossly normal. Right homonymous hemianopia. Extra-ocular movements intact. No ptosis. Face is symmetric, tongue midline. Motor: Tone and bulk are normal. Power  is full in both arms and legs. Reflexes are symmetric, no pathologic reflexes present. Intact finger to nose bilaterally Sensory: Intact to light touch and temperature Gait: Normal and tandem gait is deferred.   Labs: I have reviewed the data as listed    Component Value Date/Time   NA 142 05/30/2019 1044   K 4.1 05/30/2019 1044   CL 107 05/30/2019 1044   CO2 28 05/30/2019 1044   GLUCOSE 88 05/30/2019 1044   BUN 28 (H) 05/30/2019 1044   CREATININE 0.90 05/30/2019 1044   CALCIUM 9.0 05/30/2019 1044   PROT 6.2 (L) 05/30/2019 1044   ALBUMIN 3.6 05/30/2019 1044   AST 15 05/30/2019 1044   ALT 22 05/30/2019 1044   ALKPHOS 66 05/30/2019 1044   BILITOT 0.4 05/30/2019 1044   GFRNONAA >60 05/30/2019 1044   GFRAA >60 05/30/2019 1044   Lab Results  Component Value Date   WBC 6.2 07/04/2019   NEUTROABS 4.0 07/04/2019   HGB 13.6 07/04/2019   HCT 39.3 07/04/2019   MCV 94.0 07/04/2019   PLT 185 07/04/2019    Imaging:  [facility] Clinician Interpretation: I have personally reviewed the CNS images as listed.  My interpretation, in the context of the patient's clinical presentation, is treatment effect vs true progression  MR Brain W Wo Contrast  Result Date: 07/01/2019 CLINICAL DATA:  Follow-up glioblastoma status post resection in 12/2018 and radiation therapy 01/23/2019-03/03/2019. ongoing treatment with Temodar. EXAM: MRI HEAD WITHOUT AND WITH CONTRAST TECHNIQUE: Multiplanar, multiecho pulse sequences of the brain and surrounding structures were obtained without and with intravenous contrast. CONTRAST:  76m GADAVIST GADOBUTROL 1 MMOL/ML IV SOLN COMPARISON:  05/26/2019 FINDINGS: Brain: Sequelae of left occipital craniotomy and tumor resection are again identified. A heterogeneous left parieto-occipital mass with thick irregular peripheral enhancement measures 6.7 x 4.4 x 5.1 cm, not significantly changed upon remeasurement. The mass is again noted to extend to the atrium of the left lateral ventricle where there is a small amount of unchanged ependymal enhancement. Extensive nonenhancing T2 hyperintensity in the left parieto-occipital white matter extending into the posterior temporal lobe is unchanged with unchanged mass effect on the left lateral ventricle but no midline shift. A small amount of chronic blood products are again noted within the mass. No abnormal enhancement is identified remote from the mass. Mild dural thickening subjacent to the craniotomy is likely postoperative. No acute infarct or hydrocephalus is identified. There is unchanged trace extra-axial fluid subjacent to the craniotomy. No significant background chronic small vessel disease is evident in the cerebral white matter. Vascular: Major intracranial vascular flow voids are preserved. Skull and upper cervical spine: Left occipital craniotomy. No suspicious marrow lesion. Sinuses/Orbits: Unremarkable orbits. Paranasal sinuses and mastoid air  cells are clear. Other: None. IMPRESSION: Unchanged enhancing left parieto-occipital mass and surrounding edema. Electronically Signed   By: ALogan BoresM.D.   On: 07/01/2019 11:03     Assessment/Plan Glioblastoma with isocitrate dehydrogenase gene wildtype ([place] [C71.9]   RNORELY SCHLICKis clinically stable today following cycle #3 of 5-day Temodar.  MRI demonstrates stabilization of prior tumor growth representing likely pseudo-progression.   We recommended continuing treatment with Temozolomide, with cycle #4 200 mg/m2, on for five days and off for twenty three days in twenty eight day cycles. The patient will have a complete blood count performed on days 21 and 28 of each cycle, and a comprehensive metabolic panel performed on day 28 of each cycle. Labs may need to be performed more often. Zofran  will prescribed for home use for nausea/vomiting.   Chemotherapy should be held for the following:  ANC less than 1,000  Platelets less than 100,000  LFT or creatinine greater than 2x ULN  If clinical concerns/contraindications develop  We recommended decreasing decadron to 54m daily, and then 287mdaily after two weeks.   She should follow up in 1 month with labs for evaluation, given progressive changes seen today, prior to cycle #5.  All questions were answered. The patient knows to call the clinic with any problems, questions or concerns. No barriers to learning were detected.  The total time spent in the encounter was 40 minutes and more than 50% was on counseling and review of test results   ZaVentura SellersMD Medical Director of Neuro-Oncology [facility] WeTaylor6/29/21 10:43 AM

## 2019-07-05 ENCOUNTER — Telehealth: Payer: Self-pay | Admitting: Internal Medicine

## 2019-07-05 NOTE — Telephone Encounter (Signed)
Scheduled per 6/29 los. Pt is aware of appts. 

## 2019-07-06 MED FILL — TEMOZOLOMIDE 100 MG CAPS: 100 | 5 days supply | Qty: 15 | Fill #0

## 2019-07-12 ENCOUNTER — Other Ambulatory Visit: Payer: Self-pay | Admitting: Internal Medicine

## 2019-07-13 NOTE — Telephone Encounter (Signed)
Rx Request 

## 2019-07-14 ENCOUNTER — Other Ambulatory Visit: Payer: Self-pay | Admitting: Internal Medicine

## 2019-07-14 DIAGNOSIS — M79605 Pain in left leg: Secondary | ICD-10-CM | POA: Diagnosis not present

## 2019-07-14 DIAGNOSIS — C719 Malignant neoplasm of brain, unspecified: Secondary | ICD-10-CM | POA: Diagnosis not present

## 2019-07-14 DIAGNOSIS — M79661 Pain in right lower leg: Secondary | ICD-10-CM | POA: Diagnosis not present

## 2019-07-14 DIAGNOSIS — M79662 Pain in left lower leg: Secondary | ICD-10-CM | POA: Diagnosis not present

## 2019-07-14 NOTE — Progress Notes (Signed)
Patient describing relatively sudden/acute left leg pain and swelling in the calf.  Recommended stat venous ultrasound of the leg to rule out DVT.  Study ordered to [facility] to due current location of patient.  Ventura Sellers, MD

## 2019-08-01 ENCOUNTER — Other Ambulatory Visit: Payer: Self-pay | Admitting: Internal Medicine

## 2019-08-01 DIAGNOSIS — C719 Malignant neoplasm of brain, unspecified: Secondary | ICD-10-CM

## 2019-08-01 MED FILL — TEMOZOLOMIDE 100 MG CAPS: 100 | 5 days supply | Qty: 15 | Fill #0

## 2019-08-04 ENCOUNTER — Inpatient Hospital Stay: Payer: BC Managed Care – PPO

## 2019-08-04 ENCOUNTER — Other Ambulatory Visit: Payer: Self-pay

## 2019-08-04 ENCOUNTER — Inpatient Hospital Stay: Payer: BC Managed Care – PPO | Attending: Internal Medicine | Admitting: Internal Medicine

## 2019-08-04 VITALS — BP 128/79 | HR 79 | Temp 97.7°F | Resp 18 | Ht 62.0 in | Wt 124.5 lb

## 2019-08-04 DIAGNOSIS — E039 Hypothyroidism, unspecified: Secondary | ICD-10-CM | POA: Diagnosis not present

## 2019-08-04 DIAGNOSIS — C714 Malignant neoplasm of occipital lobe: Secondary | ICD-10-CM | POA: Insufficient documentation

## 2019-08-04 DIAGNOSIS — E7800 Pure hypercholesterolemia, unspecified: Secondary | ICD-10-CM | POA: Insufficient documentation

## 2019-08-04 DIAGNOSIS — Z79899 Other long term (current) drug therapy: Secondary | ICD-10-CM | POA: Insufficient documentation

## 2019-08-04 DIAGNOSIS — Z7982 Long term (current) use of aspirin: Secondary | ICD-10-CM | POA: Insufficient documentation

## 2019-08-04 DIAGNOSIS — Z923 Personal history of irradiation: Secondary | ICD-10-CM | POA: Diagnosis not present

## 2019-08-04 DIAGNOSIS — Z7952 Long term (current) use of systemic steroids: Secondary | ICD-10-CM | POA: Insufficient documentation

## 2019-08-04 DIAGNOSIS — Z9221 Personal history of antineoplastic chemotherapy: Secondary | ICD-10-CM | POA: Diagnosis not present

## 2019-08-04 DIAGNOSIS — C719 Malignant neoplasm of brain, unspecified: Secondary | ICD-10-CM | POA: Diagnosis not present

## 2019-08-04 LAB — CBC WITH DIFFERENTIAL (CANCER CENTER ONLY)
Abs Immature Granulocytes: 0.03 10*3/uL (ref 0.00–0.07)
Basophils Absolute: 0 10*3/uL (ref 0.0–0.1)
Basophils Relative: 0 %
Eosinophils Absolute: 0.1 10*3/uL (ref 0.0–0.5)
Eosinophils Relative: 2 %
HCT: 36.4 % (ref 36.0–46.0)
Hemoglobin: 12.9 g/dL (ref 12.0–15.0)
Immature Granulocytes: 1 %
Lymphocytes Relative: 13 %
Lymphs Abs: 0.8 10*3/uL (ref 0.7–4.0)
MCH: 33.9 pg (ref 26.0–34.0)
MCHC: 35.4 g/dL (ref 30.0–36.0)
MCV: 95.8 fL (ref 80.0–100.0)
Monocytes Absolute: 0.6 10*3/uL (ref 0.1–1.0)
Monocytes Relative: 9 %
Neutro Abs: 4.8 10*3/uL (ref 1.7–7.7)
Neutrophils Relative %: 75 %
Platelet Count: 119 10*3/uL — ABNORMAL LOW (ref 150–400)
RBC: 3.8 MIL/uL — ABNORMAL LOW (ref 3.87–5.11)
RDW: 15 % (ref 11.5–15.5)
WBC Count: 6.3 10*3/uL (ref 4.0–10.5)
nRBC: 0 % (ref 0.0–0.2)

## 2019-08-04 LAB — CMP (CANCER CENTER ONLY)
ALT: 23 U/L (ref 0–44)
AST: 16 U/L (ref 15–41)
Albumin: 3.4 g/dL — ABNORMAL LOW (ref 3.5–5.0)
Alkaline Phosphatase: 74 U/L (ref 38–126)
Anion gap: 10 (ref 5–15)
BUN: 27 mg/dL — ABNORMAL HIGH (ref 8–23)
CO2: 25 mmol/L (ref 22–32)
Calcium: 9.7 mg/dL (ref 8.9–10.3)
Chloride: 106 mmol/L (ref 98–111)
Creatinine: 0.81 mg/dL (ref 0.44–1.00)
GFR, Est AFR Am: >60 mL/min (ref >60)
GFR, Estimated: >60 mL/min (ref >60)
Glucose, Bld: 73 mg/dL (ref 70–99)
Potassium: 3.4 mmol/L — ABNORMAL LOW (ref 3.5–5.1)
Sodium: 141 mmol/L (ref 135–145)
Total Bilirubin: 0.7 mg/dL (ref 0.3–1.2)
Total Protein: 6.5 g/dL (ref 6.5–8.1)

## 2019-08-04 NOTE — Progress Notes (Signed)
[facility] at [place], [place] [address] [phone]   Interval Evaluation  Date of Service: 08/04/19 Patient Name: Jillian Gomez Patient MRN: [id number] Patient DOB: 20-Jan-1955 Provider: Ventura Sellers, MD  Identifying Statement:  Jillian Gomez is a 64 y.o. female with left occipital glioblastoma   Oncologic History: Oncology History  Glioblastoma with isocitrate dehydrogenase gene wildtype ([place])  12/23/2018 Surgery   Craniotomy, left occipital resection by Dr. Kathyrn Sheriff.  Path is GBM IDH-wt   01/23/2019 - 03/03/2019 Radiation Therapy   IMRT with concurrent Temozolomide 63m/m2   03/28/2019 -  Chemotherapy   The patient had temozolomide (TEMODAR) 20 MG capsule, 20 mg (100 % of original dose 20 mg), Oral, Daily, 1 of 1 cycle, Start date: 03/28/2019, End date: 05/30/2019 Dose modification: 20 mg (original dose 20 mg, Cycle 1) temozolomide (TEMODAR) 100 MG capsule, 300 mg, Oral, Daily, 1 of 1 cycle, Start date: 07/04/2019, End date: -- Dose modification: 200 mg (original dose 200 mg, Cycle 1), 300 mg (original dose 200 mg, Cycle 1)  for chemotherapy treatment.      Biomarkers:  MGMT Unknown.  IDH 1/2 Wild type.  EGFR Unknown  TERT Unknown   Interval History:  Jillian ROSENFIELDpresents today for follow up after cycle #4 TMZ.  No issues with dosing of the Temodar.  This month describes some modest weight gain in the abdominal region.  Currently on 255mdaily decadron without issue.  No further new or progressive deficits, no headaches or seizures.  Remains functionally independent as prior.  H+P (01/09/19) Patient presented to medical attention in early December with ~2 months history of right sided visual impairment.  She describes fuzzy or blurry vision on that side which was progressive over time, leading to at least one fall.  MRI brain demonstrated enhancing left occipital mass; this was subsequently resected by Dr. [name]  12/23/18.  She had no issues with surgery and has completed her steroid taper.  Continues on keppra daily but no history of any seizure. She has returned to work with only modest difficutly.  No issues walking or performing ADLs; lives alone but her sister and other family members are nearby.  Medications: Current Outpatient Medications on File Prior to Visit  Medication Sig Dispense Refill  . aspirin 81 MG chewable tablet Chew 1 tablet (81 mg total) by mouth daily.    . Marland Kitchentorvastatin (LIPITOR) 40 MG tablet Take 40 mg by mouth daily.     . Biotin 1 MG CAPS Take 1 mg by mouth daily.    . Marland KitchenALCIUM PO Take 1 tablet by mouth daily.    . Marland Kitchenexamethasone (DECADRON) 2 MG tablet Take 2 tablets by mouth once daily 60 tablet 0  . EUTHYROX 50 MCG tablet Take 50 mcg by mouth daily.    . naproxen sodium (ALEVE) 220 MG tablet Take 1 tablet (220 mg total) by mouth daily as needed (pain).    . ondansetron (ZOFRAN) 8 MG tablet Take 1 tablet (8 mg total) by mouth 2 (two) times daily as needed (nausea and vomiting). May take 30-60 minutes prior to Temodar administration if nausea/vomiting occurs. 30 tablet 1  . temozolomide (TEMODAR) 100 MG capsule TAKE 3 CAPSULES (300 MG TOTAL) BY MOUTH DAILY. MAY TAKE ON AN EMPTY STOMACH TO DECREASE NAUSEA & VOMITING. 15 capsule 0  . temozolomide (TEMODAR) 100 MG capsule TAKE 3 CAPSULES (300 MG TOTAL) BY MOUTH DAILY. MAY TAKE ON AN  EMPTY STOMACH TO DECREASE NAUSEA & VOMITING. 15 capsule 0   No current facility-administered medications on file prior to visit.    Allergies:  Allergies  Allergen Reactions  . Etodolac Other (See Comments)    GI upset  . Macrobid [Nitrofurantoin] Rash   Past Medical History:  Past Medical History:  Diagnosis Date  . High cholesterol   . Hypothyroidism   . Joint pain    Past Surgical History:  Past Surgical History:  Procedure Laterality Date  . APPLICATION OF CRANIAL NAVIGATION Left 12/23/2018   Procedure: APPLICATION OF CRANIAL  NAVIGATION;  Surgeon: Consuella Lose, MD;  Location: [facility];  Service: Neurosurgery;  Laterality: Left;  APPLICATION OF CRANIAL NAVIGATION  . BREAST BIOPSY Left   . COLONOSCOPY    . CRANIOTOMY Left 12/23/2018   Procedure: STEREOTACTIC LEFT OCCIPITAL CRANIOTOMY FOR RESECTION OF TUMOR;  Surgeon: Consuella Lose, MD;  Location: [facility];  Service: Neurosurgery;  Laterality: Left;  STEREOTACTIC LEFT OCCIPITAL CRANIOTOMY FOR RESECTION OF TUMOR  . LASIK     Social History:  Social History   Socioeconomic History  . Marital status: Single    Spouse name: Not on file  . Number of children: Not on file  . Years of education: Not on file  . Highest education level: Not on file  Occupational History  . Not on file  Tobacco Use  . Smoking status: Never Smoker  . Smokeless tobacco: Never Used  Vaping Use  . Vaping Use: Never used  Substance and Sexual Activity  . Alcohol use: Not Currently  . Drug use: Never  . Sexual activity: Not on file  Other Topics Concern  . Not on file  Social History Narrative  . Not on file   Social Determinants of Health   Financial Resource Strain:   . Difficulty of Paying Living Expenses:   Food Insecurity:   . Worried About Charity fundraiser in the Last Year:   . Arboriculturist in the Last Year:   Transportation Needs:   . Film/video editor (Medical):   Marland Kitchen Lack of Transportation (Non-Medical):   Physical Activity:   . Days of Exercise per Week:   . Minutes of Exercise per Session:   Stress:   . Feeling of Stress :   Social Connections:   . Frequency of Communication with Friends and Family:   . Frequency of Social Gatherings with Friends and Family:   . Attends Religious Services:   . Active Member of Clubs or Organizations:   . Attends Archivist Meetings:   Marland Kitchen Marital Status:   Intimate Partner Violence:   . Fear of Current or Ex-Partner:   . Emotionally Abused:   Marland Kitchen Physically Abused:   . Sexually Abused:    Family  History: No family history on file.  Review of Systems: Constitutional: Denies fevers, chills or abnormal weight loss Eyes: Denies blurriness of vision Gastrointestinal:  Denies nausea, constipation, diarrhea GU: Denies dysuria or incontinence Skin: Denies abnormal skin rashes Musculoskeletal: Denies joint pain, back or neck discomfort.  Behavioral/Psych: Denies anxiety, mood instability  Physical Exam: Vitals:   08/04/19 0923  BP: 128/79  Pulse: 79  Resp: 18  Temp: 97.7 F (36.5 C)  SpO2: 100%   KPS: 80. General: Alert, cooperative, pleasant, in no acute distress Head: Craniotomy scar noted, dry and intact. EENT: No conjunctival injection or scleral icterus. Oral mucosa moist Lungs: Resp effort normal Cardiac: Regular rate and rhythm Abdomen: Soft, non-distended abdomen  Skin: No rashes cyanosis or petechiae. Extremities: No clubbing or edema  Neurologic Exam: Mental Status: Awake, alert, attentive to examiner. Oriented to self and environment. Language is fluent with intact comprehension.  Cranial Nerves: Visual acuity is grossly normal. Right homonymous hemianopia. Extra-ocular movements intact. No ptosis. Face is symmetric, tongue midline. Motor: Tone and bulk are normal. Power is full in both arms and legs. Reflexes are symmetric, no pathologic reflexes present. Intact finger to nose bilaterally Sensory: Intact to light touch and temperature Gait: Normal and tandem gait is deferred.   Labs: I have reviewed the data as listed    Component Value Date/Time   NA 142 07/04/2019 1004   K 3.4 (L) 07/04/2019 1004   CL 105 07/04/2019 1004   CO2 26 07/04/2019 1004   GLUCOSE 83 07/04/2019 1004   BUN 26 (H) 07/04/2019 1004   CREATININE 1.04 (H) 07/04/2019 1004   CALCIUM 9.2 07/04/2019 1004   PROT 6.6 07/04/2019 1004   ALBUMIN 3.8 07/04/2019 1004   AST 19 07/04/2019 1004   ALT 25 07/04/2019 1004   ALKPHOS 60 07/04/2019 1004   BILITOT 0.8 07/04/2019 1004   GFRNONAA 57  (L) 07/04/2019 1004   GFRAA >60 07/04/2019 1004   Lab Results  Component Value Date   WBC 6.3 08/04/2019   NEUTROABS 4.8 08/04/2019   HGB 12.9 08/04/2019   HCT 36.4 08/04/2019   MCV 95.8 08/04/2019   PLT 119 (L) 08/04/2019     Assessment/Plan Glioblastoma with isocitrate dehydrogenase gene wildtype ([facility]) [C71.9]   AVERIE HORNBAKER is clinically stable today following cycle #4 of 5-day Temodar.  Labs are within normal limits today although platelet count has diminished.   We recommended continuing treatment with Temozolomide, with cycle #5 200 mg/m2, on for five days and off for twenty three days in twenty eight day cycles. The patient will have a complete blood count performed on days 21 and 28 of each cycle, and a comprehensive metabolic panel performed on day 28 of each cycle. Labs may need to be performed more often. Zofran will prescribed for home use for nausea/vomiting.   Chemotherapy should be held for the following:  ANC less than 1,000  Platelets less than 100,000  LFT or creatinine greater than 2x ULN  If clinical concerns/contraindications develop  We recommended decreasing decadron to 10m daily, then stopping after two weeks.   She should follow up in 1 month with MRI brain for evaluation, given progressive changes seen today, prior to cycle #6.  All questions were answered. The patient knows to call the clinic with any problems, questions or concerns. No barriers to learning were detected.  I have spent a total of 30 minutes of face-to-face and non-face-to-face time, excluding clinical staff time, preparing to see patient, ordering tests and/or medications, counseling the patient, and independently interpreting results and communicating results to the patient/family/caregiver    ZVentura Sellers MD Medical Director of Neuro-Oncology [facility]at W[facility]idden07/30/21 9:33 AM

## 2019-08-07 ENCOUNTER — Telehealth: Payer: Self-pay | Admitting: Internal Medicine

## 2019-08-07 NOTE — Telephone Encounter (Signed)
Scheduled per 7/30 los. Pt is aware of appt time and date.

## 2019-08-08 ENCOUNTER — Ambulatory Visit: Payer: BC Managed Care – PPO | Admitting: Internal Medicine

## 2019-08-08 ENCOUNTER — Other Ambulatory Visit: Payer: BC Managed Care – PPO

## 2019-08-29 ENCOUNTER — Other Ambulatory Visit: Payer: Self-pay | Admitting: Radiation Therapy

## 2019-09-01 ENCOUNTER — Other Ambulatory Visit: Payer: Self-pay

## 2019-09-01 ENCOUNTER — Ambulatory Visit (HOSPITAL_COMMUNITY)
Admission: RE | Admit: 2019-09-01 | Discharge: 2019-09-01 | Disposition: A | Discharge status: Home / Self Care | Payer: BC Managed Care – PPO | Source: Ambulatory Visit | Attending: Internal Medicine | Admitting: Internal Medicine

## 2019-09-01 DIAGNOSIS — C719 Malignant neoplasm of brain, unspecified: Secondary | ICD-10-CM | POA: Insufficient documentation

## 2019-09-01 IMAGING — MR MR HEAD WO/W CM
16 of 17 series · 41 of 48 positions shown · IV contrast (gadavist)
Comparison: None.

CLINICAL DATA: Glioblastoma. Assess treatment response. Resection
[DATE]. Radiation therapy in [DATE].[DATE]

EXAM:
MRI HEAD WITHOUT AND WITH CONTRAST
TECHNIQUE: Multiplanar, multiecho pulse sequences of the brain and surrounding
structures were obtained without and with intravenous contrast.
CONTRAST:  5mL GADAVIST GADOBUTROL 1 MMOL/ML IV SOLN

[Series 5: DWI · axial · 3.0mm · 1.36mm/px · z∈[-71,+78]mm · 5 of 104 slices shown (1 of 4)]
[im 1/104]
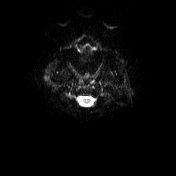
[im 26/104]
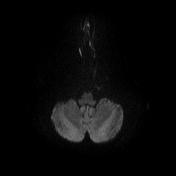
[im 52/104]
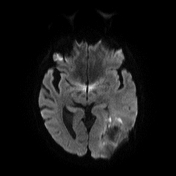
[im 78/104]
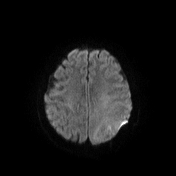
[im 104/104]
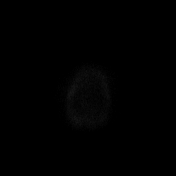

[Series 6: DWI · axial · 3.0mm · 1.36mm/px · z∈[-71,+78]mm · 3 of 52 slices shown (2 of 4)]
[im 1/52]
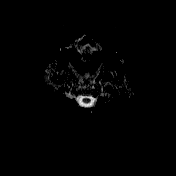
[im 26/52]
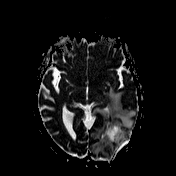
[im 52/52]
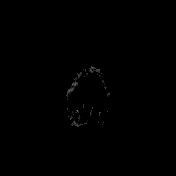

[Series 7: T1 · sagittal · 5.0mm · 0.75mm/px · 1 of 24 slices shown (1 of 4)]
[im 1/24]
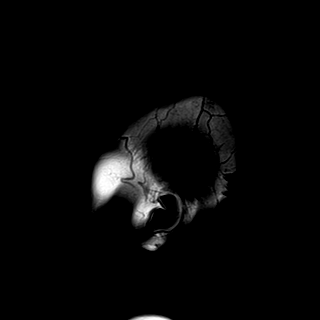

[Series 8: T2 · axial · 5.0mm · 0.62mm/px · 1 of 24 slices shown]
[im 1/24]
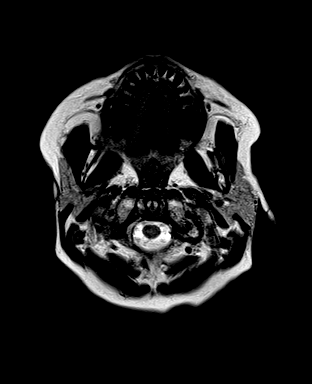

[Series 9: mip_images(sw) · axial · 24.0mm · 0.75mm/px · z∈[-59,+70]mm · 2 of 45 slices shown]
[im 1/45]
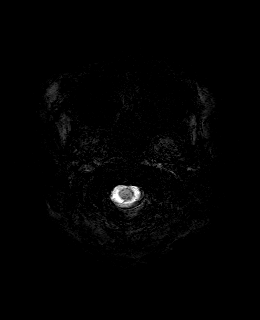
[im 45/45]
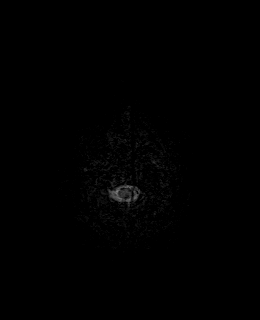

[Series 10: swi_images · axial · 3.0mm · 0.75mm/px · z∈[-69,+80]mm · 3 of 52 slices shown]
[im 1/52]
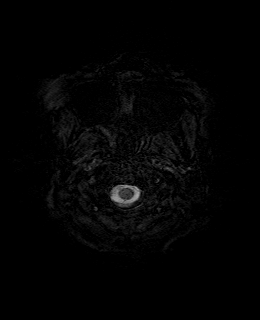
[im 26/52]
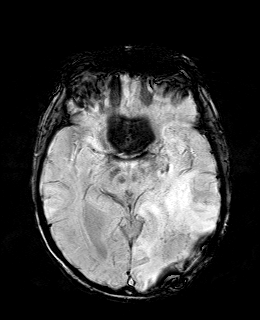
[im 52/52]
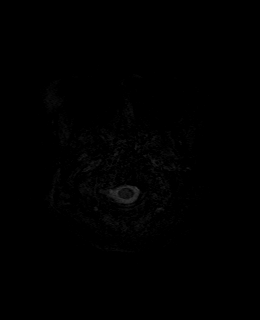

[Series 11: FLAIR · axial · 3.0mm · 0.75mm/px · z∈[-69,+80]mm · 3 of 52 slices shown]
[im 1/52]
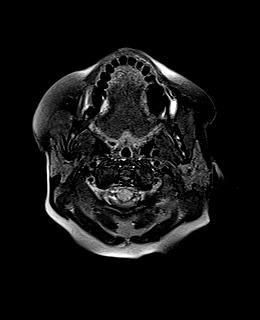
[im 26/52]
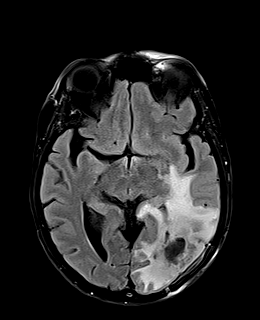
[im 52/52]
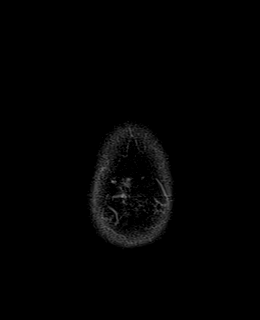

[Series 12: T1 · axial · 1.0mm · 0.94mm/px · z∈[-61,+79]mm · 7 of 144 slices shown (2 of 4)]
[im 1/144]
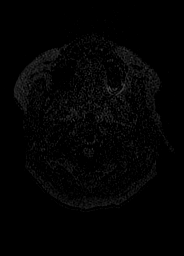
[im 24/144]
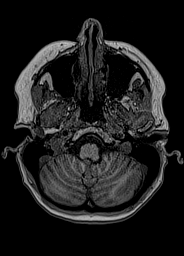
[im 48/144]
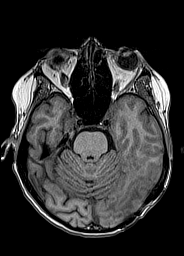
[im 72/144]
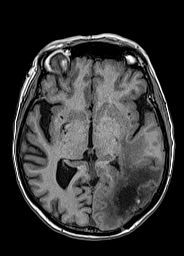
[im 96/144]
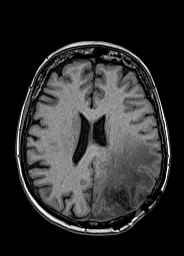
[im 120/144]
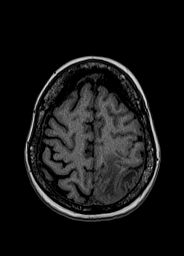
[im 144/144]
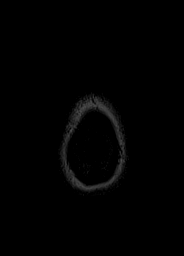

[Series 13: DWI · coronal · 5.0mm · 1.31mm/px · 3 of 63 slices shown (3 of 4)]
[im 1/63]
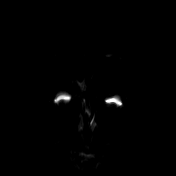
[im 32/63]
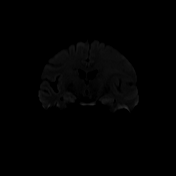
[im 63/63]
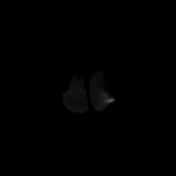

[Series 14: DWI · coronal · 5.0mm · 1.31mm/px · 1 of 31 slices shown (4 of 4)]
[im 1/31]
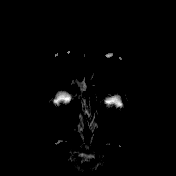

[Series 15: T2 post-contrast · coronal · 5.0mm · 0.57mm/px · 1 of 24 slices shown]
[im 1/24]
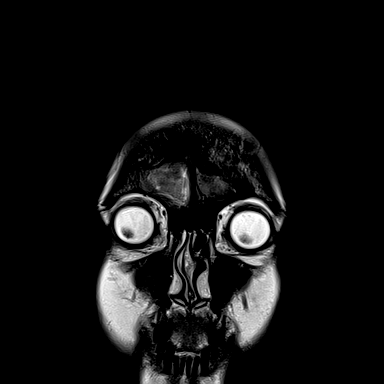

[Series 16: T1 post-contrast · axial · 1.0mm · 0.94mm/px · z∈[-61,+79]mm · 7 of 144 slices shown (1 of 3)]
[im 1/144]
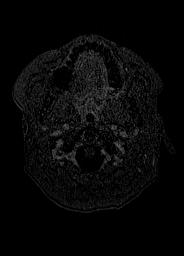
[im 24/144]
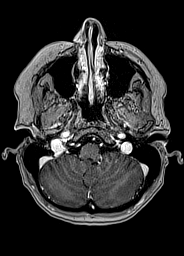
[im 48/144]
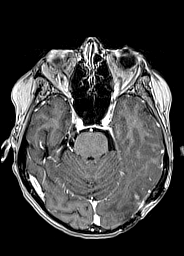
[im 72/144]
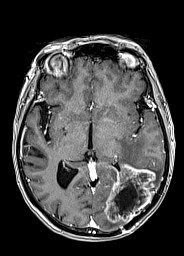
[im 96/144]
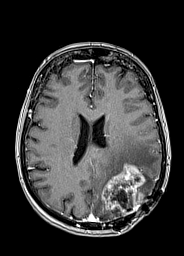
[im 120/144]
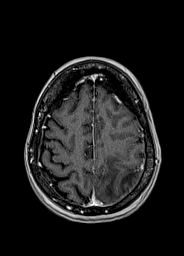
[im 144/144]
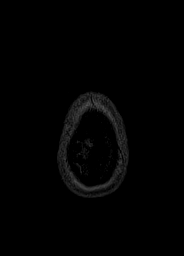

[Series 17: T1 · sagittal · 4.0mm · 0.94mm/px · 1 of 30 slices shown (3 of 4)]
[im 1/30]
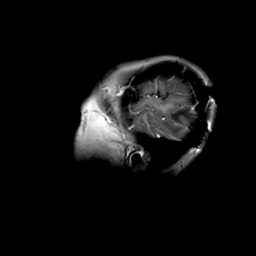

[Series 18: T1 · coronal · 4.0mm · 0.94mm/px · 1 of 30 slices shown (4 of 4)]
[im 1/30]
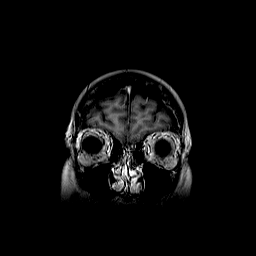

[Series 19: T1 post-contrast · coronal · 5.0mm · 0.43mm/px · 1 of 24 slices shown (2 of 3)]
[im 1/24]
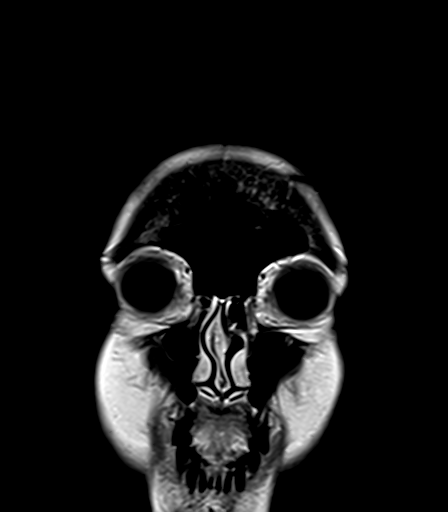

[Series 20: T1 post-contrast · sagittal · 5.0mm · 0.75mm/px · 1 of 24 slices shown (3 of 3)]
[im 1/24]
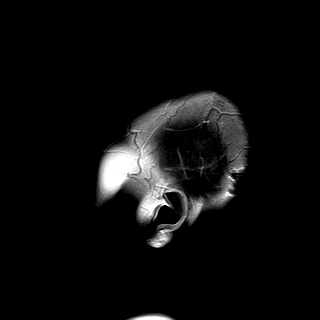

[41 of 48 positions shown; findings below may reference images not displayed]

FINDINGS: Brain: Sequela of left occipital craniotomy for tumor section.
Heterogeneous left parieto-occipital mass with thick irregular
peripheral enhancement with increased nodular soft tissue
enhancement along the superior and antral aspect of the tumor with
restricted diffusion (for example see series 16, image 92 and series
5 image 86). Extensive surrounding T2/FLAIR hyperintensity, which is
increased along the anterior margin the tumor, now extending more
anteriorly into the white matter of the left basal ganglia and
slightly further into the splenium of corpus callosum. Similar
extension to the atrium of the left lateral ventricle where there is
a small amount of similar ependymal enhancement. The central cystic
resection cavity is similar in size and there is similar appearance
of small amount of residual blood products. Mild dural thickening
along the resection cavity is also unchanged. No acute/new
hemorrhage. No hydrocephalus. No superimposed acute infarct. No
substantial change in mass effect.

Vascular: Major intracranial flow voids are maintained.

Skull and upper cervical spine: Left occipital craniotomy.

Sinuses/Orbits: Negative.
IMPRESSION: Increased nodular enhancement and restricted diffusion along the
anterior/superior aspect of the left parieto-occipital mass with
increased surrounding T2/FLAIR signal hyperintensity. Overall,
findings are concerning for disease progression although an element
of treatment response is possible.

## 2019-09-01 MED ORDER — GADOBUTROL 1 MMOL/ML IV SOLN
5.0000 mL | Freq: Once | INTRAVENOUS | Status: AC | PRN
  Administered 2019-09-01: 5 mL via INTRAVENOUS

## 2019-09-04 ENCOUNTER — Inpatient Hospital Stay: Payer: BC Managed Care – PPO | Attending: Internal Medicine | Admitting: Internal Medicine

## 2019-09-04 ENCOUNTER — Telehealth: Payer: Self-pay | Admitting: Pharmacist

## 2019-09-04 ENCOUNTER — Inpatient Hospital Stay: Payer: BC Managed Care – PPO

## 2019-09-04 ENCOUNTER — Other Ambulatory Visit: Payer: Self-pay | Admitting: *Deleted

## 2019-09-04 ENCOUNTER — Other Ambulatory Visit: Payer: Self-pay

## 2019-09-04 ENCOUNTER — Telehealth: Payer: Self-pay

## 2019-09-04 VITALS — BP 137/79 | HR 91 | Temp 97.3°F | Resp 17 | Ht 62.0 in | Wt 125.0 lb

## 2019-09-04 DIAGNOSIS — E039 Hypothyroidism, unspecified: Secondary | ICD-10-CM | POA: Insufficient documentation

## 2019-09-04 DIAGNOSIS — Z7952 Long term (current) use of systemic steroids: Secondary | ICD-10-CM | POA: Insufficient documentation

## 2019-09-04 DIAGNOSIS — C714 Malignant neoplasm of occipital lobe: Secondary | ICD-10-CM | POA: Insufficient documentation

## 2019-09-04 DIAGNOSIS — C719 Malignant neoplasm of brain, unspecified: Secondary | ICD-10-CM | POA: Diagnosis not present

## 2019-09-04 DIAGNOSIS — E7800 Pure hypercholesterolemia, unspecified: Secondary | ICD-10-CM | POA: Diagnosis not present

## 2019-09-04 DIAGNOSIS — Z79899 Other long term (current) drug therapy: Secondary | ICD-10-CM | POA: Diagnosis not present

## 2019-09-04 DIAGNOSIS — Z7189 Other specified counseling: Secondary | ICD-10-CM | POA: Diagnosis not present

## 2019-09-04 DIAGNOSIS — Z923 Personal history of irradiation: Secondary | ICD-10-CM | POA: Diagnosis not present

## 2019-09-04 LAB — CBC WITH DIFFERENTIAL (CANCER CENTER ONLY)
Abs Immature Granulocytes: 0.03 10*3/uL (ref 0.00–0.07)
Basophils Absolute: 0 10*3/uL (ref 0.0–0.1)
Basophils Relative: 0 %
Eosinophils Absolute: 0.2 10*3/uL (ref 0.0–0.5)
Eosinophils Relative: 3 %
HCT: 36.7 % (ref 36.0–46.0)
Hemoglobin: 12.6 g/dL (ref 12.0–15.0)
Immature Granulocytes: 0 %
Lymphocytes Relative: 15 %
Lymphs Abs: 1.1 10*3/uL (ref 0.7–4.0)
MCH: 34 pg (ref 26.0–34.0)
MCHC: 34.3 g/dL (ref 30.0–36.0)
MCV: 98.9 fL (ref 80.0–100.0)
Monocytes Absolute: 0.7 10*3/uL (ref 0.1–1.0)
Monocytes Relative: 9 %
Neutro Abs: 5.6 10*3/uL (ref 1.7–7.7)
Neutrophils Relative %: 73 %
Platelet Count: 207 10*3/uL (ref 150–400)
RBC: 3.71 MIL/uL — ABNORMAL LOW (ref 3.87–5.11)
RDW: 14.6 % (ref 11.5–15.5)
WBC Count: 7.6 10*3/uL (ref 4.0–10.5)
nRBC: 0 % (ref 0.0–0.2)

## 2019-09-04 LAB — URINALYSIS, COMPLETE (UACMP) WITH MICROSCOPIC
Bacteria, UA: NONE SEEN
Bilirubin Urine: NEGATIVE
Glucose, UA: NEGATIVE mg/dL
Hgb urine dipstick: NEGATIVE
Ketones, ur: NEGATIVE mg/dL
Leukocytes,Ua: NEGATIVE
Nitrite: NEGATIVE
Protein, ur: NEGATIVE mg/dL
Specific Gravity, Urine: 1.014 (ref 1.005–1.030)
pH: 6 (ref 5.0–8.0)

## 2019-09-04 LAB — CMP (CANCER CENTER ONLY)
ALT: 22 U/L (ref 0–44)
AST: 16 U/L (ref 15–41)
Albumin: 3.3 g/dL — ABNORMAL LOW (ref 3.5–5.0)
Alkaline Phosphatase: 79 U/L (ref 38–126)
Anion gap: 9 (ref 5–15)
BUN: 22 mg/dL (ref 8–23)
CO2: 29 mmol/L (ref 22–32)
Calcium: 10 mg/dL (ref 8.9–10.3)
Chloride: 101 mmol/L (ref 98–111)
Creatinine: 0.96 mg/dL (ref 0.44–1.00)
GFR, Est AFR Am: >60 mL/min (ref >60)
GFR, Estimated: >60 mL/min (ref >60)
Glucose, Bld: 86 mg/dL (ref 70–99)
Potassium: 2.8 mmol/L — CL (ref 3.5–5.1)
Sodium: 139 mmol/L (ref 135–145)
Total Bilirubin: 0.6 mg/dL (ref 0.3–1.2)
Total Protein: 6.5 g/dL (ref 6.5–8.1)

## 2019-09-04 MED ORDER — POTASSIUM CHLORIDE CRYS ER 10 MEQ PO TBCR: 10.0000 meq | EXTENDED_RELEASE_TABLET | Freq: Every day | ORAL | 3 refills | Status: DC

## 2019-09-04 MED ORDER — LOMUSTINE 40 MG PO CAPS: 40.0000 mg | ORAL_CAPSULE | Freq: Once | ORAL | 0 refills | Status: DC

## 2019-09-04 MED ORDER — DEXAMETHASONE 2 MG PO TABS
4.0000 mg | ORAL_TABLET | Freq: Every day | ORAL | 1 refills | Status: DC
Start: 2019-09-04 — End: 2019-10-31

## 2019-09-04 MED ORDER — ONDANSETRON HCL 8 MG PO TABS: 8.0000 mg | ORAL_TABLET | Freq: Two times a day (BID) | ORAL | 1 refills | Status: DC | PRN

## 2019-09-04 MED ORDER — LOMUSTINE 100 MG PO CAPS: 100.0000 mg | ORAL_CAPSULE | Freq: Once | ORAL | 0 refills | Status: DC

## 2019-09-04 NOTE — Progress Notes (Signed)
DISCONTINUE ON PATHWAY REGIMEN - Neuro     A cycle is every 28 days:     Temozolomide      Temozolomide   **Always confirm dose/schedule in your pharmacy ordering system**  REASON: Disease Progression PRIOR TREATMENT: [id number]: Temozolomide 150/200 mg/m2 PO D1-5 q28 Days x 6-12 Cycles TREATMENT RESPONSE: Progressive Disease (PD)  START ON PATHWAY REGIMEN - Neuro     A cycle is every 42 days:     Lomustine   **Always confirm dose/schedule in your pharmacy ordering system**  Patient Characteristics: Glioblastoma (Grade IV Glioma), Recurrent or Progressive, Nonsurgical Candidate, Systemic Therapy Candidate Disease Classification: Glioma Disease Classification: Glioblastoma (Grade IV Glioma) Disease Status: Recurrent or Progressive Treatment Classification: Nonsurgical Candidate Treatment (Nonsurgical/Adjuvant): Systemic Therapy Candidate Intent of Therapy: Non-Curative / Palliative Intent, Discussed with Patient 

## 2019-09-04 NOTE — Telephone Encounter (Signed)
Oral Oncology Pharmacist Encounter  Received new prescription for Gleostine (lomustine) for the second-line treatment of Glioblastoma in conjunction with bevacizumab, planned duration until disease progression or unacceptable drug toxicity.  Prescription dose and frequency assessed for appropriateness. OK for therapy initiation.   CBC w/ Diff and CMP from 09/04/19 assessed, noted patient with hypokalemia (K 2.8 mmol/L) - patient started on potassium supplementation - all other labs WNL.  Current medication list in Epic reviewed, no significant/relevant DDIs with Gleostine identified.  Evaluated chart and no patient barriers to medication adherence noted.   Prescription has been e-scribed to the [facility] for benefits analysis and approval.  Oral Oncology Clinic will continue to follow for insurance authorization, copayment issues, initial counseling and start date.  Leron Croak, PharmD, BCPS Hematology/Oncology Clinical Pharmacist [facility] [phone] 09/04/2019 4:06 PM

## 2019-09-04 NOTE — Telephone Encounter (Addendum)
Oral Oncology Patient Advocate Encounter  After completing a benefits investigation, prior authorization for Gleostine is not required at this time through [facility]  Patient's copay is 40mg  $92.25         100mg  $100  [place] [facility] [place] Phone [phone] Fax [phone] 09/04/2019 4:02 PM

## 2019-09-04 NOTE — Progress Notes (Signed)
Potassium 2.8.  MD made aware.

## 2019-09-04 NOTE — Progress Notes (Signed)
[facility] [place], [place] [address] [phone]   Interval Evaluation  Date of Service: 09/04/19 Patient Name: Jillian Gomez Patient MRN: [id number] Patient DOB: May 24, 1955 Provider: Ventura Sellers, MD  Identifying Statement:  Jillian Gomez is a 64 y.o. female with left occipital glioblastoma   Oncologic History: Oncology History  Glioblastoma with isocitrate dehydrogenase gene wildtype ([place])  12/23/2018 Surgery   Craniotomy, left occipital resection by Dr. Kathyrn Sheriff.  Path is GBM IDH-wt   01/23/2019 - 03/03/2019 Radiation Therapy   IMRT with concurrent Temozolomide 46m/m2   03/28/2019 -  Chemotherapy   The patient had temozolomide (TEMODAR) 20 MG capsule, 20 mg (100 % of original dose 20 mg), Oral, Daily, 1 of 1 cycle, Start date: 03/28/2019, End date: 05/30/2019 Dose modification: 20 mg (original dose 20 mg, Cycle 1) temozolomide (TEMODAR) 100 MG capsule, 300 mg, Oral, Daily, 1 of 1 cycle, Start date: 07/04/2019, End date: -- Dose modification: 200 mg (original dose 200 mg, Cycle 1), 300 mg (original dose 200 mg, Cycle 1)  for chemotherapy treatment.      Biomarkers:  MGMT Unknown.  IDH 1/2 Wild type.  EGFR Unknown  TERT Unknown   Interval History:  RANTOINETTE BORGWARDTpresents today for follow up after cycle #5 TMZ.  No problems with chemo this month.  She does describe worsening "confusion" and difficulty with managing items at work this past week.  Struggling a lot with calculations, choosing words to write and speak, and some difficulty even with reading. Still on 287mdaily decadron without attempt at further taper.  No right sided weakness, but visual impairment on that side persists. No frank seizures or headaches.. Marland KitchenH+P (01/09/19) Patient presented to medical attention in early December with ~2 months history of right sided visual impairment.  She describes fuzzy or blurry vision on that side which was progressive  over time, leading to at least one fall.  MRI brain demonstrated enhancing left occipital mass; this was subsequently resected by Dr. [name] 12/23/18.  She had no issues with surgery and has completed her steroid taper.  Continues on keppra daily but no history of any seizure. She has returned to work with only modest difficutly.  No issues walking or performing ADLs; lives alone but her sister and other family members are nearby.  Medications: Current Outpatient Medications on File Prior to Visit  Medication Sig Dispense Refill  . Biotin 1 MG CAPS Take 1 mg by mouth daily.    . Marland KitchenALCIUM PO Take 1 tablet by mouth daily.    . Marland Kitchenexamethasone (DECADRON) 2 MG tablet Take 2 tablets by mouth once daily 60 tablet 0  . EUTHYROX 50 MCG tablet Take 50 mcg by mouth daily.    . naproxen sodium (ALEVE) 220 MG tablet Take 1 tablet (220 mg total) by mouth daily as needed (pain).    . ondansetron (ZOFRAN) 8 MG tablet Take 1 tablet (8 mg total) by mouth 2 (two) times daily as needed (nausea and vomiting). May take 30-60 minutes prior to Temodar administration if nausea/vomiting occurs. 30 tablet 1  . temozolomide (TEMODAR) 100 MG capsule TAKE 3 CAPSULES (300 MG TOTAL) BY MOUTH DAILY. MAY TAKE ON AN EMPTY STOMACH TO DECREASE NAUSEA & VOMITING. 15 capsule 0  . temozolomide (TEMODAR) 100 MG capsule TAKE 3 CAPSULES (300 MG TOTAL) BY MOUTH DAILY. MAY TAKE ON AN EMPTY STOMACH TO DECREASE NAUSEA & VOMITING. 15 capsule 0  No current facility-administered medications on file prior to visit.    Allergies:  Allergies  Allergen Reactions  . Etodolac Other (See Comments)    GI upset  . Macrobid [Nitrofurantoin] Rash   Past Medical History:  Past Medical History:  Diagnosis Date  . High cholesterol   . Hypothyroidism   . Joint pain    Past Surgical History:  Past Surgical History:  Procedure Laterality Date  . APPLICATION OF CRANIAL NAVIGATION Left 12/23/2018   Procedure: APPLICATION OF CRANIAL NAVIGATION;   Surgeon: Consuella Lose, MD;  Location: [facility];  Service: Neurosurgery;  Laterality: Left;  APPLICATION OF CRANIAL NAVIGATION  . BREAST BIOPSY Left   . COLONOSCOPY    . CRANIOTOMY Left 12/23/2018   Procedure: STEREOTACTIC LEFT OCCIPITAL CRANIOTOMY FOR RESECTION OF TUMOR;  Surgeon: Consuella Lose, MD;  Location: [facility];  Service: Neurosurgery;  Laterality: Left;  STEREOTACTIC LEFT OCCIPITAL CRANIOTOMY FOR RESECTION OF TUMOR  . LASIK     Social History:  Social History   Socioeconomic History  . Marital status: Single    Spouse name: Not on file  . Number of children: Not on file  . Years of education: Not on file  . Highest education level: Not on file  Occupational History  . Not on file  Tobacco Use  . Smoking status: Never Smoker  . Smokeless tobacco: Never Used  Vaping Use  . Vaping Use: Never used  Substance and Sexual Activity  . Alcohol use: Not Currently  . Drug use: Never  . Sexual activity: Not on file  Other Topics Concern  . Not on file  Social History Narrative  . Not on file   Social Determinants of Health   Financial Resource Strain:   . Difficulty of Paying Living Expenses: Not on file  Food Insecurity:   . Worried About Charity fundraiser in the Last Year: Not on file  . Ran Out of Food in the Last Year: Not on file  Transportation Needs:   . Lack of Transportation (Medical): Not on file  . Lack of Transportation (Non-Medical): Not on file  Physical Activity:   . Days of Exercise per Week: Not on file  . Minutes of Exercise per Session: Not on file  Stress:   . Feeling of Stress : Not on file  Social Connections:   . Frequency of Communication with Friends and Family: Not on file  . Frequency of Social Gatherings with Friends and Family: Not on file  . Attends Religious Services: Not on file  . Active Member of Clubs or Organizations: Not on file  . Attends Archivist Meetings: Not on file  . Marital Status: Not on file  Intimate  Partner Violence:   . Fear of Current or Ex-Partner: Not on file  . Emotionally Abused: Not on file  . Physically Abused: Not on file  . Sexually Abused: Not on file   Family History: No family history on file.  Review of Systems: Constitutional: Denies fevers, chills or abnormal weight loss Eyes: Denies blurriness of vision Gastrointestinal:  Denies nausea, constipation, diarrhea GU: Denies dysuria or incontinence Skin: Denies abnormal skin rashes Musculoskeletal: Denies joint pain, back or neck discomfort.  Behavioral/Psych: Denies anxiety, mood instability  Physical Exam: Vitals:   09/04/19 1053  BP: 137/79  Pulse: 91  Resp: 17  Temp: (!) 97.3 F (36.3 C)  SpO2: 100%   KPS: 80. General: Alert, cooperative, pleasant, in no acute distress Head: Craniotomy scar noted, dry  and intact. EENT: No conjunctival injection or scleral icterus. Oral mucosa moist Lungs: Resp effort normal Cardiac: Regular rate and rhythm Abdomen: Soft, non-distended abdomen Skin: No rashes cyanosis or petechiae. Extremities: No clubbing or edema  Neurologic Exam: Mental Status: Awake, alert, attentive to examiner. Oriented to self and environment. Language is notable for mild transcortical expressive dyshpasia. Cranial Nerves: Visual acuity is grossly normal. Right homonymous hemianopia. Extra-ocular movements intact. No ptosis. Face is symmetric, tongue midline. Motor: Tone and bulk are normal. Power is full in both arms and legs. Reflexes are symmetric, no pathologic reflexes present. Intact finger to nose bilaterally Sensory: Intact to light touch and temperature Gait: Normal and tandem gait is deferred.   Labs: I have reviewed the data as listed    Component Value Date/Time   NA 141 08/04/2019 0907   K 3.4 (L) 08/04/2019 0907   CL 106 08/04/2019 0907   CO2 25 08/04/2019 0907   GLUCOSE 73 08/04/2019 0907   BUN 27 (H) 08/04/2019 0907   CREATININE 0.81 08/04/2019 0907   CALCIUM 9.7  08/04/2019 0907   PROT 6.5 08/04/2019 0907   ALBUMIN 3.4 (L) 08/04/2019 0907   AST 16 08/04/2019 0907   ALT 23 08/04/2019 0907   ALKPHOS 74 08/04/2019 0907   BILITOT 0.7 08/04/2019 0907   GFRNONAA >60 08/04/2019 0907   GFRAA >60 08/04/2019 0907   Lab Results  Component Value Date   WBC 7.6 09/04/2019   NEUTROABS 5.6 09/04/2019   HGB 12.6 09/04/2019   HCT 36.7 09/04/2019   MCV 98.9 09/04/2019   PLT 207 09/04/2019    Imaging:  [facility] Clinician Interpretation: I have personally reviewed the CNS images as listed.  My interpretation, in the context of the patient's clinical presentation, is treatment effect vs true progression  MR BRAIN W WO CONTRAST  Result Date: 09/01/2019 CLINICAL DATA:  Glioblastoma. Assess treatment response. Resection 12/25/2018. Radiation therapy in Jan/Feb 2021. EXAM: MRI HEAD WITHOUT AND WITH CONTRAST TECHNIQUE: Multiplanar, multiecho pulse sequences of the brain and surrounding structures were obtained without and with intravenous contrast. CONTRAST:  36m GADAVIST GADOBUTROL 1 MMOL/ML IV SOLN COMPARISON:  None. FINDINGS: Brain: Sequela of left occipital craniotomy for tumor section. Heterogeneous left parieto-occipital mass with thick irregular peripheral enhancement with increased nodular soft tissue enhancement along the superior and antral aspect of the tumor with restricted diffusion (for example see series 16, image 92 and series 5 image 86). Extensive surrounding T2/FLAIR hyperintensity, which is increased along the anterior margin the tumor, now extending more anteriorly into the white matter of the left basal ganglia and slightly further into the splenium of corpus callosum. Similar extension to the atrium of the left lateral ventricle where there is a small amount of similar ependymal enhancement. The central cystic resection cavity is similar in size and there is similar appearance of small amount of residual blood products. Mild dural thickening along the  resection cavity is also unchanged. No acute/new hemorrhage. No hydrocephalus. No superimposed acute infarct. No substantial change in mass effect. Vascular: Major intracranial flow voids are maintained. Skull and upper cervical spine: Left occipital craniotomy. Sinuses/Orbits: Negative. IMPRESSION: Increased nodular enhancement and restricted diffusion along the anterior/superior aspect of the left parieto-occipital mass with increased surrounding T2/FLAIR signal hyperintensity. Overall, findings are concerning for disease progression although an element of treatment response is possible. Electronically Signed   By: FMargaretha SheffieldMD   On: 09/01/2019 11:16    Assessment/Plan Glioblastoma with isocitrate dehydrogenase gene wildtype ([place] [C71.9]  DANIJELA VESSEY is clinically progressive today, following cycle #5 of 5-day Temodar.  MRI demonstrates nodular focus of enhancement which is new from prior, with accompanying localized T2/FLAIR signal abnormality.  Changes could be related to recrudescence of radio-inflammatory syndrome but are most consistent with progression of organic neoplasm.   We recommended discontinuing treatment with Temozolomide, and proceeding with second line therapy.  Recommending oral CCNU 24m/m2 q6 weeks with concurrent Avastin IV 167mkg q2 weeks.  Avastin is needed due to bulky and enhancing sympomatic disease within dominant hemisphere, as well as for steroid sparing effect given patient reliance on corticosteroids. The patient will have a complete blood count performed on days 14, 28 and 42 of each cycle, and a comprehensive metabolic panel performed on day 14, 28 and 42 of each cycle. Labs may need to be performed more often. Zofran will prescribed for home use for nausea/vomiting.  Side effects including hypertension, wound healing abnormalities, bleeding clotting events, nasuea/vomiting, cytopenias were reviewed.  Chemotherapy should be held for the following:  ANC  less than 1,000  Platelets less than 100,000  LFT or creatinine greater than 2x ULN  If clinical concerns/contraindications develop  Avastin should be held for the following:  ANC less than 500  Platelets less than 50,000  LFT or creatinine greater than 2x ULN  If clinical concerns/contraindications develop  We recommended increasing decadron in the interim, 17m20may x3 days then 4mg517mily until Avastin is initiated.   Will also start daily potassium chloride 10me1mven hypokalmeia on CMP today.  She should follow up for first Avastin infusion once it is scheduled.  All questions were answered. The patient knows to call the clinic with any problems, questions or concerns. No barriers to learning were detected.  I have spent a total of 40 minutes of face-to-face and non-face-to-face time, excluding clinical staff time, preparing to see patient, ordering tests and/or medications, counseling the patient, and independently interpreting results and communicating results to the patient/family/caregiver    ZachaVentura SellersMedical Director of Neuro-Oncology [facility]burn0/21 10:50 AM

## 2019-09-05 ENCOUNTER — Other Ambulatory Visit: Payer: Self-pay | Admitting: Internal Medicine

## 2019-09-05 LAB — URINE CULTURE: Culture: <10000 — AB

## 2019-09-06 ENCOUNTER — Telehealth: Payer: Self-pay | Admitting: Internal Medicine

## 2019-09-06 NOTE — Telephone Encounter (Signed)
Spoke with pt and unable to schedule per 8/31 sch message. Pt requested to get treatment in . [place] RN RN Maudie Mercury.

## 2019-09-07 ENCOUNTER — Telehealth: Payer: Self-pay | Admitting: Internal Medicine

## 2019-09-07 NOTE — Telephone Encounter (Signed)
Scheduled per 8/31 sch message. Pt is aware of appt time and date. Messaged charge nurse pool to move tx closer to follow up appt.

## 2019-09-13 NOTE — Telephone Encounter (Signed)
Oral Chemotherapy Pharmacist Encounter   Attempted to reach patient to provide update and offer for initial counseling on oral medication: Gleostine (lomustine).  No answer.  Left voicemail for patient to call back to discuss details of medication acquisition and initial counseling session.  Leron Croak, PharmD, BCPS Hematology/Oncology Clinical Pharmacist [facility] [phone] 09/13/2019 12:36 PM

## 2019-09-14 ENCOUNTER — Telehealth: Payer: Self-pay | Admitting: *Deleted

## 2019-09-14 NOTE — Telephone Encounter (Signed)
Oral Chemotherapy Pharmacist Encounter   Attempted to reach patient to provide update and offer for initial counseling on oral medication: Gleostine (lomustine).  No answer.  Left voicemail for patient to call back to discuss details of medication acquisition and initial counseling session.  Leron Croak, PharmD, BCPS Hematology/Oncology Clinical Pharmacist [facility] [phone] 09/14/2019 11:06 AM

## 2019-09-14 NOTE — Telephone Encounter (Signed)
Per Dr. Mickeal Skinner patient is safe to get Covid Booster vaccine with her current treatment plan that is due to start next week.

## 2019-09-15 MED FILL — GLEOSTINE 40 MG CAPSULE: 40 | 1 days supply | Qty: 1 | Fill #0

## 2019-09-15 MED FILL — ONDANSETRON HCL 8 MG TABLET: 8 | 15 days supply | Qty: 30 | Fill #0

## 2019-09-15 MED FILL — GLEOSTINE 100 MG CAPSULE: 100 | 1 days supply | Qty: 1 | Fill #0

## 2019-09-15 NOTE — Telephone Encounter (Signed)
Oral Chemotherapy Pharmacist Encounter  I spoke with patient for overview of: Gleostine (lomustine) for the treatment of glioblastoma multiforme in conjunction with bevacizumab, planned duration until disease progression or unacceptable toxicity.  Counseled patient on administration, dosing, side effects, monitoring, drug-food interactions, safe handling, storage, and disposal.  Patient will take one lomustine 100mg  capsule and one lomustine 40mg  capsule (total daily dose 140mg ) by mouth once daily, may take at bedtime and on an empty stomach to decrease nausea and vomiting.  Lomustine will be administered once every 6 weeks.  Lomustine start date: 09/19/19   Patient will take Zofran 8mg  tablet, 1 tablet by mouth 30-60 min prior to lomustine dose to help decrease N/V.   Adverse effects include but are not limited to: nausea, vomiting, stomatitis, alopecia, decreased blood counts, alopecia, hepatotoxicity, and pulmonary toxicity.  Reviewed with patient importance of keeping a medication schedule and plan for any missed doses. No barriers to medication adherence identified.  Medication reconciliation performed and medication/allergy list updated.  Insurance authorization for lomustine has been obtained. Test claim at the pharmacy revealed copayment $192.25 for 1st fill of Lomustine. This will ship from the [facility] on 09/15/19 to deliver to patient's home on 09/18/19.  Patient informed the pharmacy will reach out 5-7 days prior to needing next fill of lomustine to coordinate continued medication acquisition to prevent break in therapy.  All questions answered.  Jillian Gomez voiced understanding and appreciation.   Patient knows to call the office with questions or concerns.  Leron Croak, PharmD, BCPS Hematology/Oncology Clinical Pharmacist [facility] [phone] 09/15/2019 1:56 PM

## 2019-09-15 NOTE — Progress Notes (Signed)
Pharmacist Chemotherapy Monitoring - Initial Assessment    Anticipated start date: 09/15/19  Regimen:  . Are orders appropriate based on the patient's diagnosis, regimen, and cycle? Yes . Does the plan date match the patient's scheduled date? Yes . Is the sequencing of drugs appropriate? Yes . Are the premedications appropriate for the patient's regimen? Yes . Prior Authorization for treatment is: Approved o If applicable, is the correct biosimilar selected based on the patient's insurance? yes  Organ Function and Labs: Marland Kitchen Are dose adjustments needed based on the patient's renal function, hepatic function, or hematologic function? Yes . Are appropriate labs ordered prior to the start of patient's treatment? Yes . Other organ system assessment, if indicated: N/A . The following baseline labs, if indicated, have been ordered: N/A  Dose Assessment: . Are the drug doses appropriate? Yes . Are the following correct: o Drug concentrations Yes o IV fluid compatible with drug Yes o Administration routes Yes o Timing of therapy Yes . If applicable, does the patient have documented access for treatment and/or plans for port-a-cath placement? not applicable . If applicable, have lifetime cumulative doses been properly documented and assessed? not applicable Lifetime Dose Tracking  No doses have been documented on this patient for the following tracked chemicals: Doxorubicin, Epirubicin, Idarubicin, Daunorubicin, Mitoxantrone, Bleomycin, Oxaliplatin, Carboplatin, Liposomal Doxorubicin  o   Toxicity Monitoring/Prevention: . The patient has the following take home antiemetics prescribed: Ondansetron . The patient has the following take home medications prescribed: N/A . Medication allergies and previous infusion related reactions, if applicable, have been reviewed and addressed. Yes . The patient's current medication list has been assessed for drug-drug interactions with their chemotherapy  regimen. no significant drug-drug interactions were identified on review.  Order Review: . Are the treatment plan orders signed? No . Is the patient scheduled to see a provider prior to their treatment? Yes  I verify that I have reviewed each item in the above checklist and answered each question accordingly.  Jillian Gomez 09/15/2019 1:58 PM

## 2019-09-19 ENCOUNTER — Other Ambulatory Visit: Payer: Self-pay

## 2019-09-19 ENCOUNTER — Inpatient Hospital Stay: Payer: BC Managed Care – PPO | Attending: Internal Medicine | Admitting: Internal Medicine

## 2019-09-19 ENCOUNTER — Inpatient Hospital Stay: Payer: BC Managed Care – PPO

## 2019-09-19 ENCOUNTER — Ambulatory Visit: Payer: BC Managed Care – PPO

## 2019-09-19 VITALS — BP 119/78 | HR 72

## 2019-09-19 VITALS — BP 138/76 | HR 76 | Temp 97.8°F | Resp 17 | Ht 62.0 in | Wt 124.9 lb

## 2019-09-19 DIAGNOSIS — C719 Malignant neoplasm of brain, unspecified: Secondary | ICD-10-CM

## 2019-09-19 DIAGNOSIS — C714 Malignant neoplasm of occipital lobe: Secondary | ICD-10-CM | POA: Insufficient documentation

## 2019-09-19 DIAGNOSIS — E039 Hypothyroidism, unspecified: Secondary | ICD-10-CM | POA: Insufficient documentation

## 2019-09-19 DIAGNOSIS — Z79899 Other long term (current) drug therapy: Secondary | ICD-10-CM | POA: Insufficient documentation

## 2019-09-19 DIAGNOSIS — E7800 Pure hypercholesterolemia, unspecified: Secondary | ICD-10-CM | POA: Diagnosis not present

## 2019-09-19 DIAGNOSIS — Z7952 Long term (current) use of systemic steroids: Secondary | ICD-10-CM | POA: Insufficient documentation

## 2019-09-19 DIAGNOSIS — Z5112 Encounter for antineoplastic immunotherapy: Secondary | ICD-10-CM | POA: Insufficient documentation

## 2019-09-19 LAB — CBC WITH DIFFERENTIAL (CANCER CENTER ONLY)
Abs Immature Granulocytes: 0.07 10*3/uL (ref 0.00–0.07)
Basophils Absolute: 0 10*3/uL (ref 0.0–0.1)
Basophils Relative: 0 %
Eosinophils Absolute: 0.1 10*3/uL (ref 0.0–0.5)
Eosinophils Relative: 1 %
HCT: 39 % (ref 36.0–46.0)
Hemoglobin: 13.5 g/dL (ref 12.0–15.0)
Immature Granulocytes: 1 %
Lymphocytes Relative: 10 %
Lymphs Abs: 1.1 10*3/uL (ref 0.7–4.0)
MCH: 33.3 pg (ref 26.0–34.0)
MCHC: 34.6 g/dL (ref 30.0–36.0)
MCV: 96.1 fL (ref 80.0–100.0)
Monocytes Absolute: 0.9 10*3/uL (ref 0.1–1.0)
Monocytes Relative: 9 %
Neutro Abs: 8.3 10*3/uL — ABNORMAL HIGH (ref 1.7–7.7)
Neutrophils Relative %: 79 %
Platelet Count: 266 10*3/uL (ref 150–400)
RBC: 4.06 MIL/uL (ref 3.87–5.11)
RDW: 13.5 % (ref 11.5–15.5)
WBC Count: 10.4 10*3/uL (ref 4.0–10.5)
nRBC: 0 % (ref 0.0–0.2)

## 2019-09-19 LAB — CMP (CANCER CENTER ONLY)
ALT: 25 U/L (ref 0–44)
AST: 16 U/L (ref 15–41)
Albumin: 3.5 g/dL (ref 3.5–5.0)
Alkaline Phosphatase: 86 U/L (ref 38–126)
Anion gap: 7 (ref 5–15)
BUN: 22 mg/dL (ref 8–23)
CO2: 26 mmol/L (ref 22–32)
Calcium: 9.3 mg/dL (ref 8.9–10.3)
Chloride: 103 mmol/L (ref 98–111)
Creatinine: 0.93 mg/dL (ref 0.44–1.00)
GFR, Est AFR Am: >60 mL/min (ref >60)
GFR, Estimated: >60 mL/min (ref >60)
Glucose, Bld: 82 mg/dL (ref 70–99)
Potassium: 3.2 mmol/L — ABNORMAL LOW (ref 3.5–5.1)
Sodium: 136 mmol/L (ref 135–145)
Total Bilirubin: 0.4 mg/dL (ref 0.3–1.2)
Total Protein: 7 g/dL (ref 6.5–8.1)

## 2019-09-19 LAB — TOTAL PROTEIN, URINE DIPSTICK: Protein, ur: NEGATIVE mg/dL

## 2019-09-19 MED ORDER — SODIUM CHLORIDE 0.9 % IV SOLN
Freq: Once | INTRAVENOUS | Status: AC
  Filled 2019-09-19: qty 250

## 2019-09-19 MED ORDER — SODIUM CHLORIDE 0.9 % IV SOLN
10.0000 mg/kg | Freq: Once | INTRAVENOUS | Status: AC
  Administered 2019-09-19: 600 mg via INTRAVENOUS
  Filled 2019-09-19: qty 16

## 2019-09-19 NOTE — Progress Notes (Signed)
[facility] [place], [place] [address] [phone]   Interval Evaluation  Date of Service: 09/19/19 Patient Name: Jillian Gomez Patient MRN: [id number] Patient DOB: [birth year] Provider: Ventura Sellers, MD  Identifying Statement:  Jillian Gomez is a 64 y.o. female with left occipital glioblastoma   Oncologic History: Oncology History  Glioblastoma with isocitrate dehydrogenase gene wildtype ([place])  12/23/2018 Surgery   Craniotomy, left occipital resection by Dr. Kathyrn Sheriff.  Path is GBM IDH-wt   01/23/2019 - 03/03/2019 Radiation Therapy   IMRT with concurrent Temozolomide 64m/m2   03/28/2019 - 03/28/2019 Chemotherapy   The patient had temozolomide (TEMODAR) 20 MG capsule, 20 mg (100 % of original dose 20 mg), Oral, Daily, 1 of 1 cycle, Start date: 03/28/2019, End date: 05/30/2019 Dose modification: 20 mg (original dose 20 mg, Cycle 1) temozolomide (TEMODAR) 100 MG capsule, 300 mg, Oral, Daily, 1 of 1 cycle, Start date: 08/01/2019, End date: -- Dose modification: 200 mg (original dose 200 mg, Cycle 1), 300 mg (original dose 200 mg, Cycle 1)  for chemotherapy treatment.    09/04/2019 -  Chemotherapy   The patient had dexamethasone (DECADRON) 4 MG tablet, 8 mg, Oral, Daily, 1 of 1 cycle, Start date: --, End date: -- lomustine (CEENU) 100 MG capsule, 100 mg (100 % of original dose 100 mg), Oral,  Once, 1 of 1 cycle, Start date: 09/04/2019, End date: 09/04/2019 Dose modification: 100 mg (original dose 100 mg, Cycle 1) lomustine (CEENU) 40 MG capsule, 40 mg (100 % of original dose 40 mg), Oral,  Once, 1 of 1 cycle, Start date: 09/04/2019, End date: 09/04/2019 Dose modification: 40 mg (original dose 40 mg, Cycle 1)  for chemotherapy treatment.    09/18/2019 -  Chemotherapy   The patient had bevacizumab-bvzr (ZIRABEV) 600 mg in sodium chloride 0.9 % 100 mL chemo infusion, 10 mg/kg, Intravenous,  Once, 0 of 6 cycles  for chemotherapy  treatment.      Biomarkers:  MGMT Unknown.  IDH 1/2 Wild type.  EGFR Unknown  TERT Unknown   Interval History:  Jillian SPANGpresents today for initial avastin infusion. Cognition has been relatively clear, no decline in motor function since prior visit.  Still struggling a lot with calculations, choosing words to write and speak, and some difficulty even with reading.  Continues on 471mdaily decadron.  No right sided weakness, but visual impairment on that side persists. No frank seizures or headaches.  H+P (01/09/19) Patient presented to medical attention in early December with ~2 months history of right sided visual impairment.  She describes fuzzy or blurry vision on that side which was progressive over time, leading to at least one fall.  MRI brain demonstrated enhancing left occipital mass; this was subsequently resected by Dr. [name] 12/23/18.  She had no issues with surgery and has completed her steroid taper.  Continues on keppra daily but no history of any seizure. She has returned to work with only modest difficutly.  No issues walking or performing ADLs; lives alone but her sister and other family members are nearby.  Medications: Current Outpatient Medications on File Prior to Visit  Medication Sig Dispense Refill  . Biotin 1 MG CAPS Take 1 mg by mouth daily.    . Marland KitchenALCIUM PO Take 1 tablet by mouth daily.    . Marland Kitchenexamethasone (DECADRON) 2 MG tablet Take 2 tablets (4 mg total) by mouth daily. 60 tablet 1  .  EUTHYROX 50 MCG tablet Take 50 mcg by mouth daily.    . naproxen sodium (ALEVE) 220 MG tablet Take 1 tablet (220 mg total) by mouth daily as needed (pain).    . ondansetron (ZOFRAN) 8 MG tablet Take 1 tablet (8 mg total) by mouth 2 (two) times daily as needed. Start on the third day after chemotherapy. 30 tablet 1  . potassium chloride SA (KLOR-CON) 10 MEQ tablet Take 1 tablet (10 mEq total) by mouth daily. 30 tablet 3   No current facility-administered medications on  file prior to visit.    Allergies:  Allergies  Allergen Reactions  . Etodolac Other (See Comments)    GI upset  . Macrobid [Nitrofurantoin] Rash   Past Medical History:  Past Medical History:  Diagnosis Date  . High cholesterol   . Hypothyroidism   . Joint pain    Past Surgical History:  Past Surgical History:  Procedure Laterality Date  . APPLICATION OF CRANIAL NAVIGATION Left 12/23/2018   Procedure: APPLICATION OF CRANIAL NAVIGATION;  Surgeon: Consuella Lose, MD;  Location: [facility];  Service: Neurosurgery;  Laterality: Left;  APPLICATION OF CRANIAL NAVIGATION  . BREAST BIOPSY Left   . COLONOSCOPY    . CRANIOTOMY Left 12/23/2018   Procedure: STEREOTACTIC LEFT OCCIPITAL CRANIOTOMY FOR RESECTION OF TUMOR;  Surgeon: Consuella Lose, MD;  Location: [facility];  Service: Neurosurgery;  Laterality: Left;  STEREOTACTIC LEFT OCCIPITAL CRANIOTOMY FOR RESECTION OF TUMOR  . LASIK     Social History:  Social History   Socioeconomic History  . Marital status: Single    Spouse name: Not on file  . Number of children: Not on file  . Years of education: Not on file  . Highest education level: Not on file  Occupational History  . Not on file  Tobacco Use  . Smoking status: Never Smoker  . Smokeless tobacco: Never Used  Vaping Use  . Vaping Use: Never used  Substance and Sexual Activity  . Alcohol use: Not Currently  . Drug use: Never  . Sexual activity: Not on file  Other Topics Concern  . Not on file  Social History Narrative  . Not on file   Social Determinants of Health   Financial Resource Strain:   . Difficulty of Paying Living Expenses: Not on file  Food Insecurity:   . Worried About Charity fundraiser in the Last Year: Not on file  . Ran Out of Food in the Last Year: Not on file  Transportation Needs:   . Lack of Transportation (Medical): Not on file  . Lack of Transportation (Non-Medical): Not on file  Physical Activity:   . Days of Exercise per Week: Not on  file  . Minutes of Exercise per Session: Not on file  Stress:   . Feeling of Stress : Not on file  Social Connections:   . Frequency of Communication with Friends and Family: Not on file  . Frequency of Social Gatherings with Friends and Family: Not on file  . Attends Religious Services: Not on file  . Active Member of Clubs or Organizations: Not on file  . Attends Archivist Meetings: Not on file  . Marital Status: Not on file  Intimate Partner Violence:   . Fear of Current or Ex-Partner: Not on file  . Emotionally Abused: Not on file  . Physically Abused: Not on file  . Sexually Abused: Not on file   Family History: No family history on file.  Review of  Systems: Constitutional: Denies fevers, chills or abnormal weight loss Eyes: Denies blurriness of vision Gastrointestinal:  Denies nausea, constipation, diarrhea GU: Denies dysuria or incontinence Skin: Denies abnormal skin rashes Musculoskeletal: Denies joint pain, back or neck discomfort.  Behavioral/Psych: Denies anxiety, mood instability  Physical Exam: Vitals:   09/19/19 1033  BP: 138/76  Pulse: 76  Resp: 17  Temp: 97.8 F (36.6 C)  SpO2: 100%   KPS: 80. General: Alert, cooperative, pleasant, in no acute distress Head: Craniotomy scar noted, dry and intact. EENT: No conjunctival injection or scleral icterus. Oral mucosa moist Lungs: Resp effort normal Cardiac: Regular rate and rhythm Abdomen: Soft, non-distended abdomen Skin: No rashes cyanosis or petechiae. Extremities: No clubbing or edema  Neurologic Exam: Mental Status: Awake, alert, attentive to examiner. Oriented to self and environment. Language is notable for mild transcortical expressive dyshpasia. Cranial Nerves: Visual acuity is grossly normal. Right homonymous hemianopia. Extra-ocular movements intact. No ptosis. Face is symmetric, tongue midline. Motor: Tone and bulk are normal. Power is full in both arms and legs. Reflexes are symmetric,  no pathologic reflexes present. Intact finger to nose bilaterally Sensory: Intact to light touch and temperature Gait: Normal and tandem gait is deferred.   Labs: I have reviewed the data as listed    Component Value Date/Time   NA 139 09/04/2019 1033   K 2.8 (LL) 09/04/2019 1033   CL 101 09/04/2019 1033   CO2 29 09/04/2019 1033   GLUCOSE 86 09/04/2019 1033   BUN 22 09/04/2019 1033   CREATININE 0.96 09/04/2019 1033   CALCIUM 10.0 09/04/2019 1033   PROT 6.5 09/04/2019 1033   ALBUMIN 3.3 (L) 09/04/2019 1033   AST 16 09/04/2019 1033   ALT 22 09/04/2019 1033   ALKPHOS 79 09/04/2019 1033   BILITOT 0.6 09/04/2019 1033   GFRNONAA >60 09/04/2019 1033   GFRAA >60 09/04/2019 1033   Lab Results  Component Value Date   WBC 10.4 09/19/2019   NEUTROABS 8.3 (H) 09/19/2019   HGB 13.5 09/19/2019   HCT 39.0 09/19/2019   MCV 96.1 09/19/2019   PLT 266 09/19/2019    Assessment/Plan Glioblastoma with isocitrate dehydrogenase gene wildtype ([facility]) [C71.9]   HILDY NICHOLL is clinically stable today, here for start of cycle #1 CCNU+Avastin.  Labs are within normal limits aside from modest hypokalemia, which is improved from two weeks prior.  She is cleared for both oral chemotherapy (tonight) and Avastin infusion today.   We recommended initiating second-line treatment with oral CCNU 11m/m2 q6 weeks with concurrent Avastin IV 170mkg q2 weeks.  Side effects including hypertension, wound healing abnormalities, bleeding clotting events, nausea/vomiting, cytopenias were reviewed.  Chemotherapy should be held for the following:  ANC less than 1,000  Platelets less than 100,000  LFT or creatinine greater than 2x ULN  If clinical concerns/contraindications develop  Avastin should be held for the following:  ANC less than 500  Platelets less than 50,000  LFT or creatinine greater than 2x ULN  If clinical concerns/contraindications develop  We recommended decreasing decadron to 29m41mdaily, until next visit in 2 weeks.  She should continue potassium chloride 25m35maily as well.  She should follow up in 2 weeks for Avastin infusion.  Next MRI brain in 6 weeks.  All questions were answered. The patient knows to call the clinic with any problems, questions or concerns. No barriers to learning were detected.  I have spent a total of 30 minutes of face-to-face and non-face-to-face time, excluding clinical staff time,  preparing to see patient, ordering tests and/or medications, counseling the patient, and independently interpreting results and communicating results to the patient/family/caregiver    Ventura Sellers, MD Medical Director of Neuro-Oncology [facility] [place] 09/19/19 10:40 AM

## 2019-09-19 NOTE — Patient Instructions (Signed)
[facility] Discharge Instructions for Patients Receiving Chemotherapy  Today you received the following chemotherapy agents: Avastin.  To help prevent nausea and vomiting after your treatment, we encourage you to take your nausea medication as prescribed.   If you develop nausea and vomiting that is not controlled by your nausea medication, call the clinic.   BELOW ARE SYMPTOMS THAT SHOULD BE REPORTED IMMEDIATELY:  *FEVER GREATER THAN 100.5 F  *CHILLS WITH OR WITHOUT FEVER  NAUSEA AND VOMITING THAT IS NOT CONTROLLED WITH YOUR NAUSEA MEDICATION  *UNUSUAL SHORTNESS OF BREATH  *UNUSUAL BRUISING OR BLEEDING  TENDERNESS IN MOUTH AND THROAT WITH OR WITHOUT PRESENCE OF ULCERS  *URINARY PROBLEMS  *BOWEL PROBLEMS  UNUSUAL RASH Items with * indicate a potential emergency and should be followed up as soon as possible.  Feel free to call the clinic should you have any questions or concerns. The clinic phone number is [phone].  Please show the [facility] at check-in to the Emergency Department and triage nurse.  Bevacizumab injection (Avastin)  What is this medicine? BEVACIZUMAB (be va SIZ yoo mab) is a monoclonal antibody. It is used to treat many types of cancer. This medicine may be used for other purposes; ask your health care provider or pharmacist if you have questions. COMMON BRAND NAME(S): Avastin, MVASI, Zirabev What should I tell my health care provider before I take this medicine? They need to know if you have any of these conditions:  diabetes  heart disease  high blood pressure  history of coughing up blood  prior anthracycline chemotherapy (e.g., doxorubicin, daunorubicin, epirubicin)  recent or ongoing radiation therapy  recent or planning to have surgery  stroke  an unusual or allergic reaction to bevacizumab, hamster proteins, mouse proteins, other medicines, foods, dyes, or preservatives  pregnant or trying to get  pregnant  breast-feeding How should I use this medicine? This medicine is for infusion into a vein. It is given by a health care professional in a hospital or clinic setting. Talk to your pediatrician regarding the use of this medicine in children. Special care may be needed. Overdosage: If you think you have taken too much of this medicine contact a poison control center or emergency room at once. NOTE: This medicine is only for you. Do not share this medicine with others. What if I miss a dose? It is important not to miss your dose. Call your doctor or health care professional if you are unable to keep an appointment. What may interact with this medicine? Interactions are not expected. This list may not describe all possible interactions. Give your health care provider a list of all the medicines, herbs, non-prescription drugs, or dietary supplements you use. Also tell them if you smoke, drink alcohol, or use illegal drugs. Some items may interact with your medicine. What should I watch for while using this medicine? Your condition will be monitored carefully while you are receiving this medicine. You will need important blood work and urine testing done while you are taking this medicine. This medicine may increase your risk to bruise or bleed. Call your doctor or health care professional if you notice any unusual bleeding. Before having surgery, talk to your health care provider to make sure it is ok. This drug can increase the risk of poor healing of your surgical site or wound. You will need to stop this drug for 28 days before surgery. After surgery, wait at least 28 days before restarting this drug. Make sure  the surgical site or wound is healed enough before restarting this drug. Talk to your health care provider if questions. Do not become pregnant while taking this medicine or for 6 months after stopping it. Women should inform their doctor if they wish to become pregnant or think they  might be pregnant. There is a potential for serious side effects to an unborn child. Talk to your health care professional or pharmacist for more information. Do not breast-feed an infant while taking this medicine and for 6 months after the last dose. This medicine has caused ovarian failure in some women. This medicine may interfere with the ability to have a child. You should talk to your doctor or health care professional if you are concerned about your fertility. What side effects may I notice from receiving this medicine? Side effects that you should report to your doctor or health care professional as soon as possible:  allergic reactions like skin rash, itching or hives, swelling of the face, lips, or tongue  chest pain or chest tightness  chills  coughing up blood  high fever  seizures  severe constipation  signs and symptoms of bleeding such as bloody or black, tarry stools; red or dark-brown urine; spitting up blood or brown material that looks like coffee grounds; red spots on the skin; unusual bruising or bleeding from the eye, gums, or nose  signs and symptoms of a blood clot such as breathing problems; chest pain; severe, sudden headache; pain, swelling, warmth in the leg  signs and symptoms of a stroke like changes in vision; confusion; trouble speaking or understanding; severe headaches; sudden numbness or weakness of the face, arm or leg; trouble walking; dizziness; loss of balance or coordination  stomach pain  sweating  swelling of legs or ankles  vomiting  weight gain Side effects that usually do not require medical attention (report to your doctor or health care professional if they continue or are bothersome):  back pain  changes in taste  decreased appetite  dry skin  nausea  tiredness This list may not describe all possible side effects. Call your doctor for medical advice about side effects. You may report side effects to FDA at  1-800-FDA-1088. Where should I keep my medicine? This drug is given in a hospital or clinic and will not be stored at home. NOTE: This sheet is a summary. It may not cover all possible information. If you have questions about this medicine, talk to your doctor, pharmacist, or health care provider.  2020 Elsevier/Gold Standard (2018-10-19 10:50:46)

## 2019-09-20 ENCOUNTER — Telehealth: Payer: Self-pay | Admitting: Internal Medicine

## 2019-09-20 NOTE — Telephone Encounter (Signed)
Scheduled per 9/14 los. Unable to reach pt. Left voicemail with appt time and date.

## 2019-10-03 ENCOUNTER — Inpatient Hospital Stay: Payer: BC Managed Care – PPO | Admitting: Internal Medicine

## 2019-10-03 ENCOUNTER — Inpatient Hospital Stay: Payer: BC Managed Care – PPO

## 2019-10-03 ENCOUNTER — Other Ambulatory Visit: Payer: Self-pay

## 2019-10-03 VITALS — BP 137/76 | HR 96 | Temp 97.4°F | Resp 18 | Ht 62.0 in | Wt 128.5 lb

## 2019-10-03 DIAGNOSIS — Z7952 Long term (current) use of systemic steroids: Secondary | ICD-10-CM | POA: Diagnosis not present

## 2019-10-03 DIAGNOSIS — C719 Malignant neoplasm of brain, unspecified: Secondary | ICD-10-CM

## 2019-10-03 DIAGNOSIS — Z79899 Other long term (current) drug therapy: Secondary | ICD-10-CM | POA: Diagnosis not present

## 2019-10-03 DIAGNOSIS — E039 Hypothyroidism, unspecified: Secondary | ICD-10-CM | POA: Diagnosis not present

## 2019-10-03 DIAGNOSIS — C714 Malignant neoplasm of occipital lobe: Secondary | ICD-10-CM | POA: Diagnosis not present

## 2019-10-03 DIAGNOSIS — E7800 Pure hypercholesterolemia, unspecified: Secondary | ICD-10-CM | POA: Diagnosis not present

## 2019-10-03 DIAGNOSIS — Z5112 Encounter for antineoplastic immunotherapy: Secondary | ICD-10-CM | POA: Diagnosis not present

## 2019-10-03 LAB — CMP (CANCER CENTER ONLY)
ALT: 31 U/L (ref 0–44)
AST: 23 U/L (ref 15–41)
Albumin: 3.3 g/dL — ABNORMAL LOW (ref 3.5–5.0)
Alkaline Phosphatase: 90 U/L (ref 38–126)
Anion gap: 11 (ref 5–15)
BUN: 24 mg/dL — ABNORMAL HIGH (ref 8–23)
CO2: 24 mmol/L (ref 22–32)
Calcium: 9.1 mg/dL (ref 8.9–10.3)
Chloride: 103 mmol/L (ref 98–111)
Creatinine: 1 mg/dL (ref 0.44–1.00)
GFR, Est AFR Am: >60 mL/min (ref >60)
GFR, Estimated: 60 mL/min — ABNORMAL LOW (ref >60)
Glucose, Bld: 116 mg/dL — ABNORMAL HIGH (ref 70–99)
Potassium: 3.7 mmol/L (ref 3.5–5.1)
Sodium: 138 mmol/L (ref 135–145)
Total Bilirubin: 0.5 mg/dL (ref 0.3–1.2)
Total Protein: 7 g/dL (ref 6.5–8.1)

## 2019-10-03 LAB — CBC WITH DIFFERENTIAL (CANCER CENTER ONLY)
Abs Immature Granulocytes: 0.03 10*3/uL (ref 0.00–0.07)
Basophils Absolute: 0 10*3/uL (ref 0.0–0.1)
Basophils Relative: 1 %
Eosinophils Absolute: 0 10*3/uL (ref 0.0–0.5)
Eosinophils Relative: 1 %
HCT: 40.7 % (ref 36.0–46.0)
Hemoglobin: 13.9 g/dL (ref 12.0–15.0)
Immature Granulocytes: 1 %
Lymphocytes Relative: 13 %
Lymphs Abs: 0.8 10*3/uL (ref 0.7–4.0)
MCH: 32.5 pg (ref 26.0–34.0)
MCHC: 34.2 g/dL (ref 30.0–36.0)
MCV: 95.1 fL (ref 80.0–100.0)
Monocytes Absolute: 0.5 10*3/uL (ref 0.1–1.0)
Monocytes Relative: 7 %
Neutro Abs: 4.8 10*3/uL (ref 1.7–7.7)
Neutrophils Relative %: 77 %
Platelet Count: 209 10*3/uL (ref 150–400)
RBC: 4.28 MIL/uL (ref 3.87–5.11)
RDW: 14.2 % (ref 11.5–15.5)
WBC Count: 6.2 10*3/uL (ref 4.0–10.5)
nRBC: 0 % (ref 0.0–0.2)

## 2019-10-03 LAB — TOTAL PROTEIN, URINE DIPSTICK: Protein, ur: NEGATIVE mg/dL

## 2019-10-03 MED ORDER — SODIUM CHLORIDE 0.9 % IV SOLN
Freq: Once | INTRAVENOUS | Status: AC
  Filled 2019-10-03: qty 250

## 2019-10-03 MED ORDER — SODIUM CHLORIDE 0.9 % IV SOLN
10.0000 mg/kg | Freq: Once | INTRAVENOUS | Status: AC
  Administered 2019-10-03: 600 mg via INTRAVENOUS
  Filled 2019-10-03: qty 8

## 2019-10-03 NOTE — Progress Notes (Signed)
[facility] [place], [place] [address] [phone]   Interval Evaluation  Date of Service: 10/03/19 Patient Name: Jillian Gomez Patient MRN: [id number] Patient DOB: January 27, 1955 Provider: Ventura Sellers, MD  Identifying Statement:  Jillian Gomez is a 64 y.o. female with left occipital glioblastoma   Oncologic History: Oncology History  Glioblastoma with isocitrate dehydrogenase gene wildtype ([place])  12/23/2018 Surgery   Craniotomy, left occipital resection by Dr. Kathyrn Sheriff.  Path is GBM IDH-wt   01/23/2019 - 03/03/2019 Radiation Therapy   IMRT with concurrent Temozolomide 77m/m2   03/28/2019 - 03/28/2019 Chemotherapy   The patient had temozolomide (TEMODAR) 20 MG capsule, 20 mg (100 % of original dose 20 mg), Oral, Daily, 1 of 1 cycle, Start date: 03/28/2019, End date: 05/30/2019 Dose modification: 20 mg (original dose 20 mg, Cycle 1) temozolomide (TEMODAR) 100 MG capsule, 300 mg, Oral, Daily, 1 of 1 cycle, Start date: 08/01/2019, End date: -- Dose modification: 200 mg (original dose 200 mg, Cycle 1), 300 mg (original dose 200 mg, Cycle 1)  for chemotherapy treatment.    09/04/2019 -  Chemotherapy   The patient had dexamethasone (DECADRON) 4 MG tablet, 8 mg, Oral, Daily, 1 of 1 cycle, Start date: --, End date: -- lomustine (CEENU) 100 MG capsule, 100 mg (100 % of original dose 100 mg), Oral,  Once, 1 of 1 cycle, Start date: 09/04/2019, End date: 09/04/2019 Dose modification: 100 mg (original dose 100 mg, Cycle 1) lomustine (CEENU) 40 MG capsule, 40 mg (100 % of original dose 40 mg), Oral,  Once, 1 of 1 cycle, Start date: 09/04/2019, End date: 09/04/2019 Dose modification: 40 mg (original dose 40 mg, Cycle 1)  for chemotherapy treatment.    09/19/2019 -  Chemotherapy   The patient had bevacizumab-bvzr (ZIRABEV) 600 mg in sodium chloride 0.9 % 100 mL chemo infusion, 10 mg/kg = 600 mg, Intravenous,  Once, 1 of 6 cycles Administration:  600 mg (09/19/2019)  for chemotherapy treatment.      Biomarkers:  MGMT Unknown.  IDH 1/2 Wild type.  EGFR Unknown  TERT Unknown   Interval History:  [name] today for avastin infusion. No clinical changes since prior visit.  Still working almost full time, less difficulty with calculations.  Still some trouble choosing words to write and speak, and some difficulty with reading. Discontinued decadron two days ago after having dosed 274mprior.  No right sided weakness, but visual impairment on that side persists. No frank seizures or headaches.  H+P (01/09/19) Patient presented to medical attention in early December with ~2 months history of right sided visual impairment.  She describes fuzzy or blurry vision on that side which was progressive over time, leading to at least one fall.  MRI brain demonstrated enhancing left occipital mass; this was subsequently resected by Dr. [name] 12/23/18.  She had no issues with surgery and has completed her steroid taper.  Continues on keppra daily but no history of any seizure. She has returned to work with only modest difficutly.  No issues walking or performing ADLs; lives alone but her sister and other family members are nearby.  Medications: Current Outpatient Medications on File Prior to Visit  Medication Sig Dispense Refill  . atorvastatin (LIPITOR) 40 MG tablet Take 20 mg by mouth daily.    . Biotin 1 MG CAPS Take 1 mg by mouth daily.    . Marland KitchenALCIUM PO Take 1 tablet by mouth  daily.    . dexamethasone (DECADRON) 2 MG tablet Take 2 tablets (4 mg total) by mouth daily. 60 tablet 1  . EUTHYROX 50 MCG tablet Take 50 mcg by mouth daily.    . naproxen sodium (ALEVE) 220 MG tablet Take 1 tablet (220 mg total) by mouth daily as needed (pain).    . ondansetron (ZOFRAN) 8 MG tablet Take 1 tablet (8 mg total) by mouth 2 (two) times daily as needed. Start on the third day after chemotherapy. 30 tablet 1  . potassium chloride SA (KLOR-CON) 10  MEQ tablet Take 1 tablet (10 mEq total) by mouth daily. 30 tablet 3   No current facility-administered medications on file prior to visit.    Allergies:  Allergies  Allergen Reactions  . Etodolac Other (See Comments)    GI upset  . Macrobid [Nitrofurantoin] Rash   Past Medical History:  Past Medical History:  Diagnosis Date  . High cholesterol   . Hypothyroidism   . Joint pain    Past Surgical History:  Past Surgical History:  Procedure Laterality Date  . APPLICATION OF CRANIAL NAVIGATION Left 12/23/2018   Procedure: APPLICATION OF CRANIAL NAVIGATION;  Surgeon: Consuella Lose, MD;  Location: [facility];  Service: Neurosurgery;  Laterality: Left;  APPLICATION OF CRANIAL NAVIGATION  . BREAST BIOPSY Left   . COLONOSCOPY    . CRANIOTOMY Left 12/23/2018   Procedure: STEREOTACTIC LEFT OCCIPITAL CRANIOTOMY FOR RESECTION OF TUMOR;  Surgeon: Consuella Lose, MD;  Location: [facility];  Service: Neurosurgery;  Laterality: Left;  STEREOTACTIC LEFT OCCIPITAL CRANIOTOMY FOR RESECTION OF TUMOR  . LASIK     Social History:  Social History   Socioeconomic History  . Marital status: Single    Spouse name: Not on file  . Number of children: Not on file  . Years of education: Not on file  . Highest education level: Not on file  Occupational History  . Not on file  Tobacco Use  . Smoking status: Never Smoker  . Smokeless tobacco: Never Used  Vaping Use  . Vaping Use: Never used  Substance and Sexual Activity  . Alcohol use: Not Currently  . Drug use: Never  . Sexual activity: Not on file  Other Topics Concern  . Not on file  Social History Narrative  . Not on file   Social Determinants of Health   Financial Resource Strain:   . Difficulty of Paying Living Expenses: Not on file  Food Insecurity:   . Worried About Charity fundraiser in the Last Year: Not on file  . Ran Out of Food in the Last Year: Not on file  Transportation Needs:   . Lack of Transportation (Medical): Not on  file  . Lack of Transportation (Non-Medical): Not on file  Physical Activity:   . Days of Exercise per Week: Not on file  . Minutes of Exercise per Session: Not on file  Stress:   . Feeling of Stress : Not on file  Social Connections:   . Frequency of Communication with Friends and Family: Not on file  . Frequency of Social Gatherings with Friends and Family: Not on file  . Attends Religious Services: Not on file  . Active Member of Clubs or Organizations: Not on file  . Attends Archivist Meetings: Not on file  . Marital Status: Not on file  Intimate Partner Violence:   . Fear of Current or Ex-Partner: Not on file  . Emotionally Abused: Not on file  .  Physically Abused: Not on file  . Sexually Abused: Not on file   Family History: No family history on file.  Review of Systems: Constitutional: Denies fevers, chills or abnormal weight loss Eyes: Denies blurriness of vision Gastrointestinal:  Denies nausea, constipation, diarrhea GU: Denies dysuria or incontinence Skin: Denies abnormal skin rashes Musculoskeletal: Denies joint pain, back or neck discomfort.  Behavioral/Psych: Denies anxiety, mood instability  Physical Exam: Vitals:   10/03/19 1211  BP: 137/76  Pulse: 96  Resp: 18  Temp: (!) 97.4 F (36.3 C)  SpO2: 99%   KPS: 80. General: Alert, cooperative, pleasant, in no acute distress Head: Craniotomy scar noted, dry and intact. EENT: No conjunctival injection or scleral icterus. Oral mucosa moist Lungs: Resp effort normal Cardiac: Regular rate and rhythm Abdomen: Soft, non-distended abdomen Skin: No rashes cyanosis or petechiae. Extremities: No clubbing or edema  Neurologic Exam: Mental Status: Awake, alert, attentive to examiner. Oriented to self and environment. Language is notable for mild transcortical expressive dyshpasia. Cranial Nerves: Visual acuity is grossly normal. Right homonymous hemianopia. Extra-ocular movements intact. No ptosis. Face is  symmetric, tongue midline. Motor: Tone and bulk are normal. Power is full in both arms and legs. Reflexes are symmetric, no pathologic reflexes present. Intact finger to nose bilaterally Sensory: Intact to light touch and temperature Gait: Normal and tandem gait is deferred.   Labs: I have reviewed the data as listed    Component Value Date/Time   NA 138 10/03/2019 1133   K 3.7 10/03/2019 1133   CL 103 10/03/2019 1133   CO2 24 10/03/2019 1133   GLUCOSE 116 (H) 10/03/2019 1133   BUN 24 (H) 10/03/2019 1133   CREATININE 1.00 10/03/2019 1133   CALCIUM 9.1 10/03/2019 1133   PROT 7.0 10/03/2019 1133   ALBUMIN 3.3 (L) 10/03/2019 1133   AST 23 10/03/2019 1133   ALT 31 10/03/2019 1133   ALKPHOS 90 10/03/2019 1133   BILITOT 0.5 10/03/2019 1133   GFRNONAA 60 (L) 10/03/2019 1133   GFRAA >60 10/03/2019 1133   Lab Results  Component Value Date   WBC 6.2 10/03/2019   NEUTROABS 4.8 10/03/2019   HGB 13.9 10/03/2019   HCT 40.7 10/03/2019   MCV 95.1 10/03/2019   PLT 209 10/03/2019    Assessment/Plan No primary diagnosis found.   JAKHIA BUXTON is clinically stable today.  Labs are within normal limits, she is cleared for Avastin infusion today.   We recommended continuing second-line treatment with oral CCNU 40m/m2 q6 weeks with concurrent Avastin IV 177mkg q2 weeks, now day 15/42.    Chemotherapy should be held for the following:  ANC less than 1,000  Platelets less than 100,000  LFT or creatinine greater than 2x ULN  If clinical concerns/contraindications develop  Avastin should be held for the following:  ANC less than 500  Platelets less than 50,000  LFT or creatinine greater than 2x ULN  If clinical concerns/contraindications develop  Ok with continuing off dexamethasone if tolerated.  She should continue potassium chloride 1011mdaily as well.  She should follow up in 2 weeks for Avastin infusion.  Next MRI brain in 4 weeks.  All questions were answered. The  patient knows to call the clinic with any problems, questions or concerns. No barriers to learning were detected.  I have spent a total of 30 minutes of face-to-face and non-face-to-face time, excluding clinical staff time, preparing to see patient, ordering tests and/or medications, counseling the patient, and independently interpreting results and communicating results  to the patient/family/caregiver    Ventura Sellers, MD Medical Director of Neuro-Oncology [facility] [place] 10/03/19 12:35 PM

## 2019-10-03 NOTE — Patient Instructions (Signed)
[facility] Discharge Instructions for Patients Receiving Chemotherapy  Today you received the following chemotherapy agents: bevacizumab  To help prevent nausea and vomiting after your treatment, we encourage you to take your nausea medication as directed.   If you develop nausea and vomiting that is not controlled by your nausea medication, call the clinic.   BELOW ARE SYMPTOMS THAT SHOULD BE REPORTED IMMEDIATELY:  *FEVER GREATER THAN 100.5 F  *CHILLS WITH OR WITHOUT FEVER  NAUSEA AND VOMITING THAT IS NOT CONTROLLED WITH YOUR NAUSEA MEDICATION  *UNUSUAL SHORTNESS OF BREATH  *UNUSUAL BRUISING OR BLEEDING  TENDERNESS IN MOUTH AND THROAT WITH OR WITHOUT PRESENCE OF ULCERS  *URINARY PROBLEMS  *BOWEL PROBLEMS  UNUSUAL RASH Items with * indicate a potential emergency and should be followed up as soon as possible.  Feel free to call the clinic should you have any questions or concerns. The clinic phone number is [phone].  Please show the CHEMO ALERT CARD at check-in to the Emergency Department and triage nurse.   

## 2019-10-04 ENCOUNTER — Telehealth: Payer: Self-pay | Admitting: Internal Medicine

## 2019-10-04 NOTE — Telephone Encounter (Signed)
Scheduled per 9/28 los. Unable to reach pt. Left voicemail with appt times and date.

## 2019-10-10 ENCOUNTER — Telehealth: Payer: Self-pay

## 2019-10-10 DIAGNOSIS — L089 Local infection of the skin and subcutaneous tissue, unspecified: Secondary | ICD-10-CM | POA: Diagnosis not present

## 2019-10-10 DIAGNOSIS — B958 Unspecified staphylococcus as the cause of diseases classified elsewhere: Secondary | ICD-10-CM | POA: Diagnosis not present

## 2019-10-10 NOTE — Telephone Encounter (Signed)
Patient's sister, Olin Hauser, who is on patient's designated party release called office stating patient has felt tired and had fever over the weekend. Patient also has blister clusters on the top of both arms. Patient is concerned this may be a reaction to the Avastin she recently started. Patient denies any other symptoms. Dr. Mickeal Skinner notified. Per Dr. Mickeal Skinner, patient needs to be evaluated for possible shingles by either her PCP, Urgent Care, or our office if she is not able to be seen today by PCP.  Called and informed patient's sister of Dr. Renda Rolls response. Patient's sister to relay information to patient and will see if patient can be seen at her PCP office. Informed sister to notify our office if patient is not able to be seen right away and this office will make arrangements to be seen in [facility]. Sister verbalized understanding. All questions were answered during phone call.

## 2019-10-16 ENCOUNTER — Other Ambulatory Visit: Payer: Self-pay | Admitting: Radiation Therapy

## 2019-10-17 ENCOUNTER — Telehealth: Payer: Self-pay | Admitting: Internal Medicine

## 2019-10-17 ENCOUNTER — Inpatient Hospital Stay: Payer: BC Managed Care – PPO

## 2019-10-17 ENCOUNTER — Other Ambulatory Visit: Payer: Self-pay

## 2019-10-17 ENCOUNTER — Inpatient Hospital Stay: Payer: BC Managed Care – PPO | Attending: Internal Medicine | Admitting: Internal Medicine

## 2019-10-17 ENCOUNTER — Ambulatory Visit: Payer: BC Managed Care – PPO

## 2019-10-17 VITALS — BP 135/80 | HR 101 | Temp 97.0°F | Resp 18 | Ht 62.0 in | Wt 125.1 lb

## 2019-10-17 VITALS — HR 89

## 2019-10-17 DIAGNOSIS — Z7952 Long term (current) use of systemic steroids: Secondary | ICD-10-CM | POA: Insufficient documentation

## 2019-10-17 DIAGNOSIS — Z5112 Encounter for antineoplastic immunotherapy: Secondary | ICD-10-CM | POA: Insufficient documentation

## 2019-10-17 DIAGNOSIS — C719 Malignant neoplasm of brain, unspecified: Secondary | ICD-10-CM | POA: Diagnosis not present

## 2019-10-17 DIAGNOSIS — E039 Hypothyroidism, unspecified: Secondary | ICD-10-CM | POA: Insufficient documentation

## 2019-10-17 DIAGNOSIS — E7800 Pure hypercholesterolemia, unspecified: Secondary | ICD-10-CM | POA: Insufficient documentation

## 2019-10-17 DIAGNOSIS — Z79899 Other long term (current) drug therapy: Secondary | ICD-10-CM | POA: Insufficient documentation

## 2019-10-17 DIAGNOSIS — C714 Malignant neoplasm of occipital lobe: Secondary | ICD-10-CM | POA: Insufficient documentation

## 2019-10-17 LAB — CBC WITH DIFFERENTIAL (CANCER CENTER ONLY)
Abs Immature Granulocytes: 0 10*3/uL (ref 0.00–0.07)
Basophils Absolute: 0 10*3/uL (ref 0.0–0.1)
Basophils Relative: 0 %
Eosinophils Absolute: 0.3 10*3/uL (ref 0.0–0.5)
Eosinophils Relative: 11 %
HCT: 32.7 % — ABNORMAL LOW (ref 36.0–46.0)
Hemoglobin: 11 g/dL — ABNORMAL LOW (ref 12.0–15.0)
Immature Granulocytes: 0 %
Lymphocytes Relative: 32 %
Lymphs Abs: 0.8 10*3/uL (ref 0.7–4.0)
MCH: 32.4 pg (ref 26.0–34.0)
MCHC: 33.6 g/dL (ref 30.0–36.0)
MCV: 96.2 fL (ref 80.0–100.0)
Monocytes Absolute: 0.2 10*3/uL (ref 0.1–1.0)
Monocytes Relative: 7 %
Neutro Abs: 1.2 10*3/uL — ABNORMAL LOW (ref 1.7–7.7)
Neutrophils Relative %: 50 %
Platelet Count: 78 10*3/uL — ABNORMAL LOW (ref 150–400)
RBC: 3.4 MIL/uL — ABNORMAL LOW (ref 3.87–5.11)
RDW: 14.6 % (ref 11.5–15.5)
WBC Count: 2.4 10*3/uL — ABNORMAL LOW (ref 4.0–10.5)
nRBC: 0 % (ref 0.0–0.2)

## 2019-10-17 LAB — CMP (CANCER CENTER ONLY)
ALT: 25 U/L (ref 0–44)
AST: 28 U/L (ref 15–41)
Albumin: 3.1 g/dL — ABNORMAL LOW (ref 3.5–5.0)
Alkaline Phosphatase: 91 U/L (ref 38–126)
Anion gap: 6 (ref 5–15)
BUN: 14 mg/dL (ref 8–23)
CO2: 25 mmol/L (ref 22–32)
Calcium: 9.8 mg/dL (ref 8.9–10.3)
Chloride: 111 mmol/L (ref 98–111)
Creatinine: 0.84 mg/dL (ref 0.44–1.00)
GFR, Estimated: >60 mL/min (ref >60)
Glucose, Bld: 89 mg/dL (ref 70–99)
Potassium: 3.5 mmol/L (ref 3.5–5.1)
Sodium: 142 mmol/L (ref 135–145)
Total Bilirubin: 0.8 mg/dL (ref 0.3–1.2)
Total Protein: 6.4 g/dL — ABNORMAL LOW (ref 6.5–8.1)

## 2019-10-17 LAB — TOTAL PROTEIN, URINE DIPSTICK: Protein, ur: NEGATIVE mg/dL

## 2019-10-17 MED ORDER — SODIUM CHLORIDE 0.9 % IV SOLN
Freq: Once | INTRAVENOUS | Status: AC
  Filled 2019-10-17: qty 250

## 2019-10-17 MED ORDER — SODIUM CHLORIDE 0.9 % IV SOLN
10.0000 mg/kg | Freq: Once | INTRAVENOUS | Status: AC
  Administered 2019-10-17: 600 mg via INTRAVENOUS
  Filled 2019-10-17: qty 8

## 2019-10-17 NOTE — Progress Notes (Signed)
[facility] [place], [place] [address] [phone]   Interval Evaluation  Date of Service: 10/17/19 Patient Name: Jillian Gomez Patient MRN: [id number] Patient DOB: 1955/05/12 Provider: Ventura Sellers, MD  Identifying Statement:  Jillian Gomez is a 64 y.o. female with left occipital glioblastoma   Oncologic History: Oncology History  Glioblastoma with isocitrate dehydrogenase gene wildtype ([place])  12/23/2018 Surgery   Craniotomy, left occipital resection by Dr. Kathyrn Sheriff.  Path is GBM IDH-wt   01/23/2019 - 03/03/2019 Radiation Therapy   IMRT with concurrent Temozolomide 25m/m2   03/28/2019 - 03/28/2019 Chemotherapy   The patient had temozolomide (TEMODAR) 20 MG capsule, 20 mg (100 % of original dose 20 mg), Oral, Daily, 1 of 1 cycle, Start date: 03/28/2019, End date: 05/30/2019 Dose modification: 20 mg (original dose 20 mg, Cycle 1) temozolomide (TEMODAR) 100 MG capsule, 300 mg, Oral, Daily, 1 of 1 cycle, Start date: 08/01/2019, End date: 09/04/2019 Dose modification: 200 mg (original dose 200 mg, Cycle 1), 300 mg (original dose 200 mg, Cycle 1)  for chemotherapy treatment.    09/04/2019 -  Chemotherapy   The patient had dexamethasone (DECADRON) 4 MG tablet, 8 mg, Oral, Daily, 1 of 1 cycle, Start date: --, End date: -- lomustine (CEENU) 100 MG capsule, 100 mg (100 % of original dose 100 mg), Oral,  Once, 1 of 1 cycle, Start date: 09/04/2019, End date: 09/04/2019 Dose modification: 100 mg (original dose 100 mg, Cycle 1) lomustine (CEENU) 40 MG capsule, 40 mg (100 % of original dose 40 mg), Oral,  Once, 1 of 1 cycle, Start date: 09/04/2019, End date: 09/04/2019 Dose modification: 40 mg (original dose 40 mg, Cycle 1)  for chemotherapy treatment.    09/19/2019 -  Chemotherapy   The patient had bevacizumab-bvzr (ZIRABEV) 600 mg in sodium chloride 0.9 % 100 mL chemo infusion, 10 mg/kg = 600 mg, Intravenous,  Once, 2 of 6  cycles Administration: 600 mg (09/19/2019), 600 mg (10/03/2019)  for chemotherapy treatment.      Biomarkers:  MGMT Unknown.  IDH 1/2 Wild type.  EGFR Unknown  TERT Unknown   Interval History:  Jillian LAIRDpresents today for avastin infusion. No clinical changes since prior visit.  No cognitive changes this month.  Continues to have deficits with reading. Overall energy seems decreased since period of fever and arm rash over the weekend, resolved at this time.  No right sided weakness, but visual impairment on that side persists. No frank seizures or headaches.  H+P (01/09/19) Patient presented to medical attention in early December with ~2 months history of right sided visual impairment.  She describes fuzzy or blurry vision on that side which was progressive over time, leading to at least one fall.  MRI brain demonstrated enhancing left occipital mass; this was subsequently resected by Dr. NKathyrn Sheriffon 12/23/18.  She had no issues with surgery and has completed her steroid taper.  Continues on keppra daily but no history of any seizure. She has returned to work with only modest difficutly.  No issues walking or performing ADLs; lives alone but her sister and other family members are nearby.  Medications: Current Outpatient Medications on File Prior to Visit  Medication Sig Dispense Refill  . atorvastatin (LIPITOR) 40 MG tablet Take 20 mg by mouth daily.    . Biotin 1 MG CAPS Take 1 mg by mouth daily.    .Marland KitchenCALCIUM PO Take 1 tablet by mouth daily.    .Marland Kitchen  dexamethasone (DECADRON) 2 MG tablet Take 2 tablets (4 mg total) by mouth daily. 60 tablet 1  . EUTHYROX 50 MCG tablet Take 50 mcg by mouth daily.    . naproxen sodium (ALEVE) 220 MG tablet Take 1 tablet (220 mg total) by mouth daily as needed (pain).    . ondansetron (ZOFRAN) 8 MG tablet Take 1 tablet (8 mg total) by mouth 2 (two) times daily as needed. Start on the third day after chemotherapy. 30 tablet 1  . potassium chloride SA  (KLOR-CON) 10 MEQ tablet Take 1 tablet (10 mEq total) by mouth daily. 30 tablet 3   No current facility-administered medications on file prior to visit.    Allergies:  Allergies  Allergen Reactions  . Etodolac Other (See Comments)    GI upset  . Macrobid [Nitrofurantoin] Rash   Past Medical History:  Past Medical History:  Diagnosis Date  . High cholesterol   . Hypothyroidism   . Joint pain    Past Surgical History:  Past Surgical History:  Procedure Laterality Date  . APPLICATION OF CRANIAL NAVIGATION Left 12/23/2018   Procedure: APPLICATION OF CRANIAL NAVIGATION;  Surgeon: Consuella Lose, MD;  Location: [facility];  Service: Neurosurgery;  Laterality: Left;  APPLICATION OF CRANIAL NAVIGATION  . BREAST BIOPSY Left   . COLONOSCOPY    . CRANIOTOMY Left 12/23/2018   Procedure: STEREOTACTIC LEFT OCCIPITAL CRANIOTOMY FOR RESECTION OF TUMOR;  Surgeon: Consuella Lose, MD;  Location: [facility];  Service: Neurosurgery;  Laterality: Left;  STEREOTACTIC LEFT OCCIPITAL CRANIOTOMY FOR RESECTION OF TUMOR  . LASIK     Social History:  Social History   Socioeconomic History  . Marital status: Single    Spouse name: Not on file  . Number of children: Not on file  . Years of education: Not on file  . Highest education level: Not on file  Occupational History  . Not on file  Tobacco Use  . Smoking status: Never Smoker  . Smokeless tobacco: Never Used  Vaping Use  . Vaping Use: Never used  Substance and Sexual Activity  . Alcohol use: Not Currently  . Drug use: Never  . Sexual activity: Not on file  Other Topics Concern  . Not on file  Social History Narrative  . Not on file   Social Determinants of Health   Financial Resource Strain:   . Difficulty of Paying Living Expenses: Not on file  Food Insecurity:   . Worried About Charity fundraiser in the Last Year: Not on file  . Ran Out of Food in the Last Year: Not on file  Transportation Needs:   . Lack of Transportation  (Medical): Not on file  . Lack of Transportation (Non-Medical): Not on file  Physical Activity:   . Days of Exercise per Week: Not on file  . Minutes of Exercise per Session: Not on file  Stress:   . Feeling of Stress : Not on file  Social Connections:   . Frequency of Communication with Friends and Family: Not on file  . Frequency of Social Gatherings with Friends and Family: Not on file  . Attends Religious Services: Not on file  . Active Member of Clubs or Organizations: Not on file  . Attends Archivist Meetings: Not on file  . Marital Status: Not on file  Intimate Partner Violence:   . Fear of Current or Ex-Partner: Not on file  . Emotionally Abused: Not on file  . Physically Abused: Not on file  .  Sexually Abused: Not on file   Family History: No family history on file.  Review of Systems: Constitutional: Denies fevers, chills or abnormal weight loss Eyes: Denies blurriness of vision Gastrointestinal:  Denies nausea, constipation, diarrhea GU: Denies dysuria or incontinence Skin: Denies abnormal skin rashes Musculoskeletal: Denies joint pain, back or neck discomfort.  Behavioral/Psych: Denies anxiety, mood instability  Physical Exam: Vitals:   10/17/19 1038  BP: 135/80  Pulse: (!) 101  Resp: 18  Temp: (!) 97 F (36.1 C)  SpO2: 100%   KPS: 80. General: Alert, cooperative, pleasant, in no acute distress Head: Craniotomy scar noted, dry and intact. EENT: No conjunctival injection or scleral icterus. Oral mucosa moist Lungs: Resp effort normal Cardiac: Regular rate and rhythm Abdomen: Soft, non-distended abdomen Skin: No rashes cyanosis or petechiae. Extremities: No clubbing or edema  Neurologic Exam: Mental Status: Awake, alert, attentive to examiner. Oriented to self and environment. Language is notable for mild transcortical expressive dyshpasia. Cranial Nerves: Visual acuity is grossly normal. Right homonymous hemianopia. Extra-ocular movements  intact. No ptosis. Face is symmetric, tongue midline. Motor: Tone and bulk are normal. Power is full in both arms and legs. Reflexes are symmetric, no pathologic reflexes present. Intact finger to nose bilaterally Sensory: Intact to light touch and temperature Gait: Normal and tandem gait is deferred.   Labs: I have reviewed the data as listed    Component Value Date/Time   NA 138 10/03/2019 1133   K 3.7 10/03/2019 1133   CL 103 10/03/2019 1133   CO2 24 10/03/2019 1133   GLUCOSE 116 (H) 10/03/2019 1133   BUN 24 (H) 10/03/2019 1133   CREATININE 1.00 10/03/2019 1133   CALCIUM 9.1 10/03/2019 1133   PROT 7.0 10/03/2019 1133   ALBUMIN 3.3 (L) 10/03/2019 1133   AST 23 10/03/2019 1133   ALT 31 10/03/2019 1133   ALKPHOS 90 10/03/2019 1133   BILITOT 0.5 10/03/2019 1133   GFRNONAA 60 (L) 10/03/2019 1133   GFRAA >60 10/03/2019 1133   Lab Results  Component Value Date   WBC 2.4 (L) 10/17/2019   NEUTROABS 1.2 (L) 10/17/2019   HGB 11.0 (L) 10/17/2019   HCT 32.7 (L) 10/17/2019   MCV 96.2 10/17/2019   PLT 78 (L) 10/17/2019    Assessment/Plan Glioblastoma with isocitrate dehydrogenase gene wildtype ([facility]) [C71.9]   Jillian Gomez is clinically stable today.  Labs are within normal limits, she is cleared for Avastin infusion today.   We recommended continuing second-line treatment with oral CCNU 63m/m2 q6 weeks with concurrent Avastin IV 176mkg q2 weeks, now day 29/42.    Chemotherapy should be held for the following:  ANC less than 1,000  Platelets less than 100,000  LFT or creatinine greater than 2x ULN  If clinical concerns/contraindications develop  Avastin should be held for the following:  ANC less than 500  Platelets less than 50,000  LFT or creatinine greater than 2x ULN  If clinical concerns/contraindications develop  Should stay off dexamethasone.  She should continue potassium chloride 1073mdaily as well.  We ask that RonCHRISTALYNN BOISEturn to clinic in 2  weeks following next brain MRI, or sooner as needed.  All questions were answered. The patient knows to call the clinic with any problems, questions or concerns. No barriers to learning were detected.  I have spent a total of 30 minutes of face-to-face and non-face-to-face time, excluding clinical staff time, preparing to see patient, ordering tests and/or medications, counseling the patient, and independently interpreting results  and communicating results to the patient/family/caregiver    Ventura Sellers, MD Medical Director of Neuro-Oncology [facility] [place] 10/17/19 10:44 AM

## 2019-10-17 NOTE — Patient Instructions (Signed)
[facility] Discharge Instructions for Patients Receiving Chemotherapy  Today you received the following chemotherapy agents: bevacizumab  To help prevent nausea and vomiting after your treatment, we encourage you to take your nausea medication as directed.   If you develop nausea and vomiting that is not controlled by your nausea medication, call the clinic.   BELOW ARE SYMPTOMS THAT SHOULD BE REPORTED IMMEDIATELY:  *FEVER GREATER THAN 100.5 F  *CHILLS WITH OR WITHOUT FEVER  NAUSEA AND VOMITING THAT IS NOT CONTROLLED WITH YOUR NAUSEA MEDICATION  *UNUSUAL SHORTNESS OF BREATH  *UNUSUAL BRUISING OR BLEEDING  TENDERNESS IN MOUTH AND THROAT WITH OR WITHOUT PRESENCE OF ULCERS  *URINARY PROBLEMS  *BOWEL PROBLEMS  UNUSUAL RASH Items with * indicate a potential emergency and should be followed up as soon as possible.  Feel free to call the clinic should you have any questions or concerns. The clinic phone number is [phone].  Please show the CHEMO ALERT CARD at check-in to the Emergency Department and triage nurse.   

## 2019-10-17 NOTE — Telephone Encounter (Signed)
Scheduled per 10/12 los. Messaged RN Benjamine Mola to give pt appt calendar.

## 2019-10-23 ENCOUNTER — Other Ambulatory Visit: Payer: Self-pay

## 2019-10-23 ENCOUNTER — Other Ambulatory Visit: Payer: Self-pay | Admitting: Internal Medicine

## 2019-10-23 DIAGNOSIS — Z79899 Other long term (current) drug therapy: Secondary | ICD-10-CM | POA: Diagnosis not present

## 2019-10-23 DIAGNOSIS — Z5112 Encounter for antineoplastic immunotherapy: Secondary | ICD-10-CM | POA: Diagnosis not present

## 2019-10-23 DIAGNOSIS — E039 Hypothyroidism, unspecified: Secondary | ICD-10-CM | POA: Diagnosis not present

## 2019-10-23 DIAGNOSIS — Z7952 Long term (current) use of systemic steroids: Secondary | ICD-10-CM | POA: Diagnosis not present

## 2019-10-23 DIAGNOSIS — E7800 Pure hypercholesterolemia, unspecified: Secondary | ICD-10-CM | POA: Diagnosis not present

## 2019-10-23 DIAGNOSIS — C719 Malignant neoplasm of brain, unspecified: Secondary | ICD-10-CM

## 2019-10-23 DIAGNOSIS — C714 Malignant neoplasm of occipital lobe: Secondary | ICD-10-CM | POA: Diagnosis not present

## 2019-10-23 LAB — URINALYSIS, COMPLETE (UACMP) WITH MICROSCOPIC
Bilirubin Urine: NEGATIVE
Glucose, UA: NEGATIVE mg/dL
Hgb urine dipstick: NEGATIVE
Ketones, ur: 5 mg/dL — AB
Leukocytes,Ua: NEGATIVE
Nitrite: NEGATIVE
Protein, ur: NEGATIVE mg/dL
Specific Gravity, Urine: 1.021 (ref 1.005–1.030)
pH: 5 (ref 5.0–8.0)

## 2019-10-23 MED ORDER — CIPROFLOXACIN HCL 500 MG PO TABS: 500.0000 mg | ORAL_TABLET | Freq: Two times a day (BID) | ORAL | 0 refills | Status: DC

## 2019-10-27 ENCOUNTER — Other Ambulatory Visit: Payer: Self-pay

## 2019-10-27 ENCOUNTER — Ambulatory Visit (HOSPITAL_COMMUNITY)
Admission: RE | Admit: 2019-10-27 | Discharge: 2019-10-27 | Disposition: A | Discharge status: Home / Self Care | Payer: BC Managed Care – PPO | Source: Ambulatory Visit | Attending: Internal Medicine | Admitting: Internal Medicine

## 2019-10-27 DIAGNOSIS — R220 Localized swelling, mass and lump, head: Secondary | ICD-10-CM | POA: Diagnosis not present

## 2019-10-27 DIAGNOSIS — G9389 Other specified disorders of brain: Secondary | ICD-10-CM | POA: Diagnosis not present

## 2019-10-27 DIAGNOSIS — I618 Other nontraumatic intracerebral hemorrhage: Secondary | ICD-10-CM | POA: Diagnosis not present

## 2019-10-27 DIAGNOSIS — C719 Malignant neoplasm of brain, unspecified: Secondary | ICD-10-CM | POA: Insufficient documentation

## 2019-10-27 IMAGING — MR MR HEAD WO/W CM
16 of 17 series · 41 of 48 positions shown · IV contrast (gadavist)
Comparison: MRI head [DATE]

CLINICAL DATA: Glioblastoma. Post resection, radiation treatment.
Currently CCNU and Avastin treatment.

EXAM:
MRI HEAD WITHOUT AND WITH CONTRAST
TECHNIQUE: Multiplanar, multiecho pulse sequences of the brain and surrounding
structures were obtained without and with intravenous contrast.
CONTRAST:  6mL GADAVIST GADOBUTROL 1 MMOL/ML IV SOLN

[Series 5: DWI · axial · 3.0mm · 1.36mm/px · z∈[-57,+84]mm · 5 of 96 slices shown (1 of 4)]
[im 1/96]
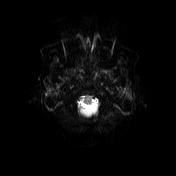
[im 24/96]
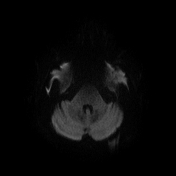
[im 48/96]
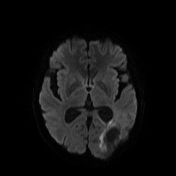
[im 72/96]
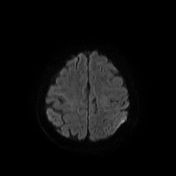
[im 96/96]
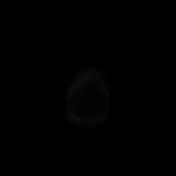

[Series 6: DWI · axial · 3.0mm · 1.36mm/px · z∈[-57,+84]mm · 3 of 48 slices shown (2 of 4)]
[im 1/48]
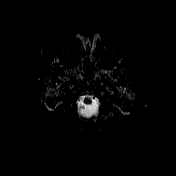
[im 24/48]
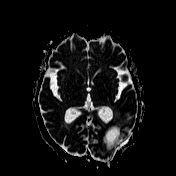
[im 48/48]
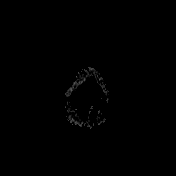

[Series 7: T1 · sagittal · 5.0mm · 0.75mm/px · 2 of 24 slices shown (1 of 4)]
[im 1/24]
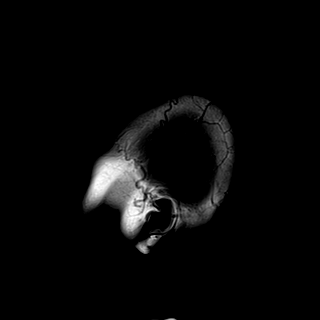
[im 24/24]
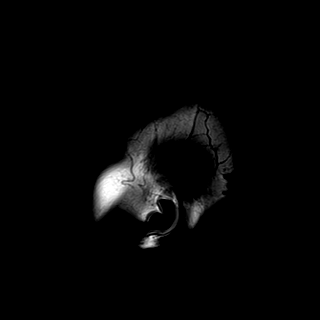

[Series 8: T2 · axial · 5.0mm · 0.62mm/px · z∈[-61,+88]mm · 2 of 24 slices shown]
[im 1/24]
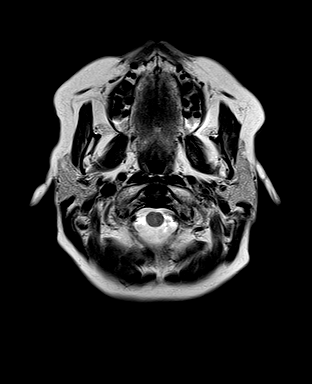
[im 24/24]
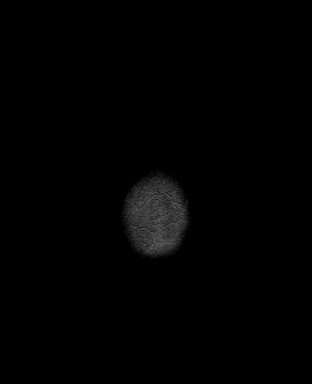

[Series 9: mip_images(sw) · axial · 24.0mm · 0.75mm/px · z∈[-52,+79]mm · 2 of 45 slices shown]
[im 1/45]
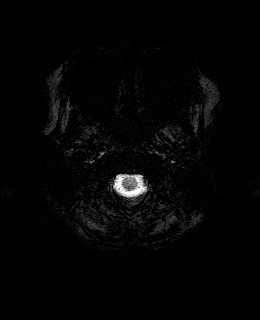
[im 45/45]
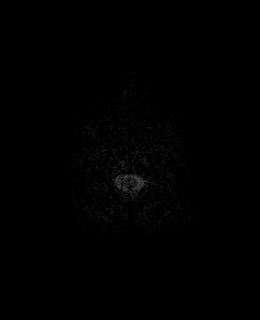

[Series 10: swi_images · axial · 3.0mm · 0.75mm/px · z∈[-63,+90]mm · 2 of 52 slices shown]
[im 1/52]
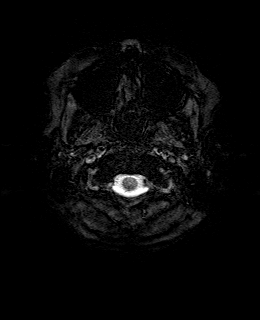
[im 52/52]
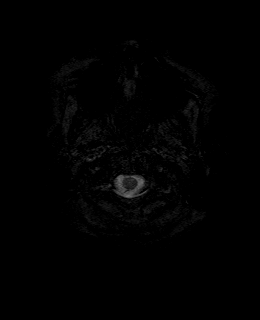

[Series 11: FLAIR · axial · 3.0mm · 0.75mm/px · z∈[-63,+90]mm · 2 of 52 slices shown]
[im 1/52]
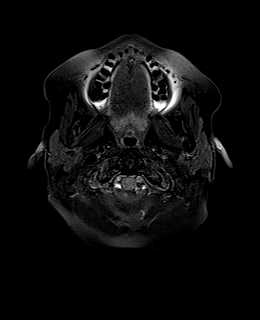
[im 52/52]
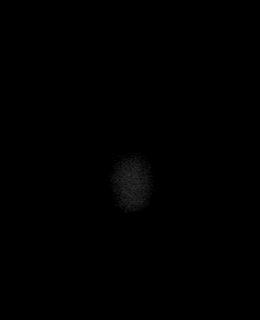

[Series 12: T1 · axial · 1.0mm · 0.94mm/px · z∈[-65,+94]mm · 7 of 160 slices shown (2 of 4)]
[im 1/160]
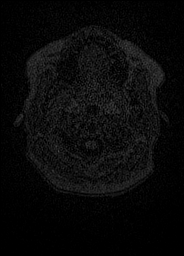
[im 27/160]
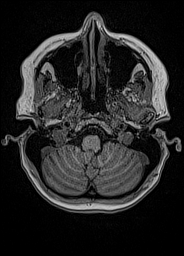
[im 54/160]
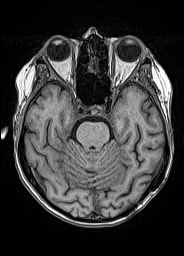
[im 80/160]
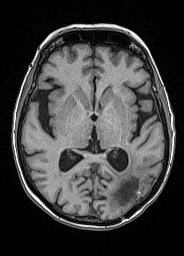
[im 107/160]
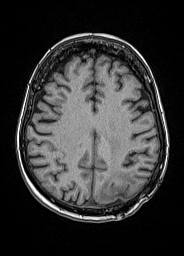
[im 133/160]
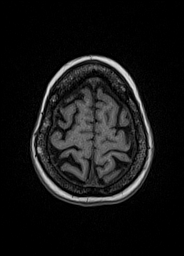
[im 160/160]
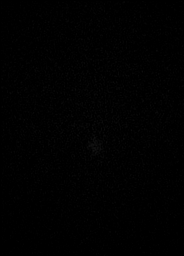

[Series 13: DWI · coronal · 5.0mm · 1.31mm/px · 3 of 64 slices shown (3 of 4)]
[im 1/64]
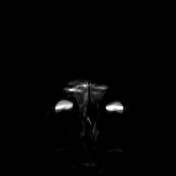
[im 32/64]
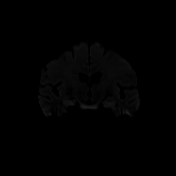
[im 64/64]
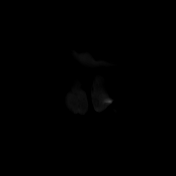

[Series 14: DWI · coronal · 5.0mm · 1.31mm/px · 1 of 32 slices shown (4 of 4)]
[im 1/32]
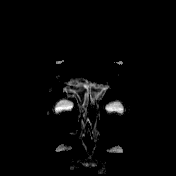

[Series 15: T2 post-contrast · coronal · 5.0mm · 0.57mm/px · 1 of 28 slices shown]
[im 1/28]
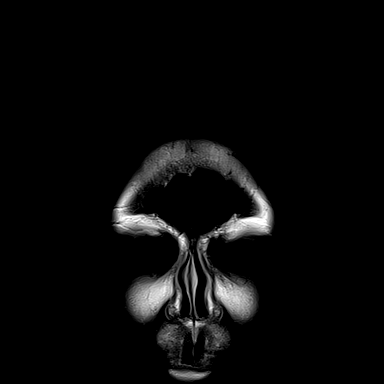

[Series 16: T1 post-contrast · axial · 1.0mm · 0.94mm/px · z∈[-65,+94]mm · 7 of 160 slices shown (1 of 3)]
[im 1/160]
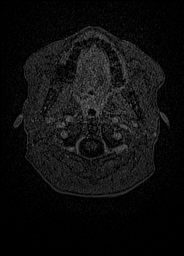
[im 27/160]
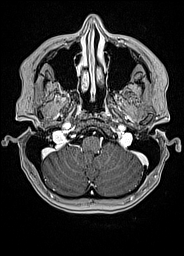
[im 54/160]
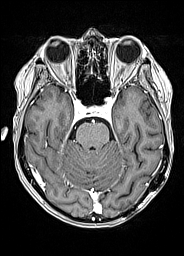
[im 80/160]
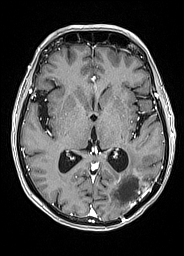
[im 107/160]
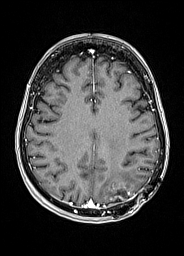
[im 133/160]
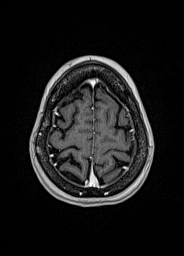
[im 160/160]
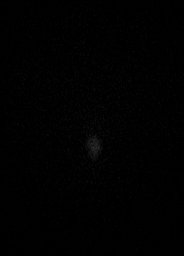

[Series 17: T1 · sagittal · 4.0mm · 0.94mm/px · 1 of 30 slices shown (3 of 4)]
[im 1/30]
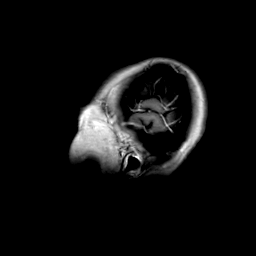

[Series 18: T1 · coronal · 4.0mm · 0.94mm/px · 1 of 30 slices shown (4 of 4)]
[im 1/30]
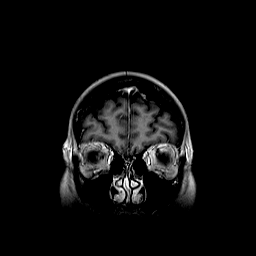

[Series 19: T1 post-contrast · coronal · 5.0mm · 0.43mm/px · 1 of 28 slices shown (2 of 3)]
[im 1/28]
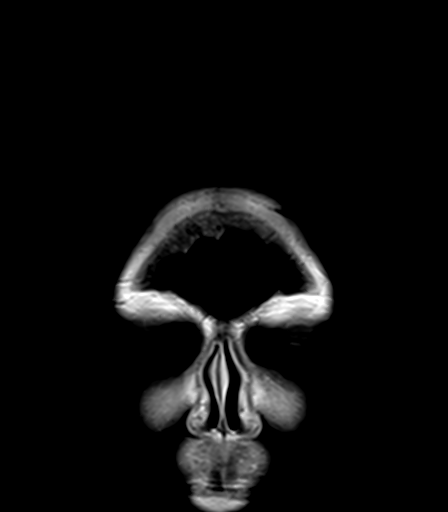

[Series 20: T1 post-contrast · sagittal · 5.0mm · 0.75mm/px · 1 of 24 slices shown (3 of 3)]
[im 1/24]
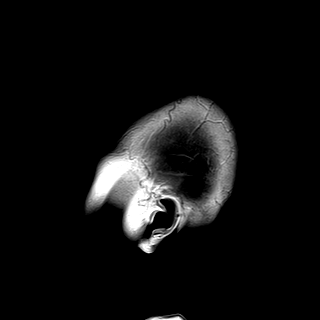

[41 of 48 positions shown; findings below may reference images not displayed]

FINDINGS: Brain: Significant improvement in mass in the left occipital
parietal lobe. The enhancing mass at the biopsy site has decreased
in size with significantly decreased enhancement. Nonenhancing white
matter hyperintensity shows marked interval improvement. There is
mild hyperintensity in the left splenium without contralateral
spread of tumor. Mass extends to the occipital horn with decreased
mass-effect. Chronic mild hemorrhage at the resection site
unchanged.

Ventricle size normal. No midline shift. Negative for acute infarct.
Abnormal diffusion corresponds to the areas of enhancement.

Vascular: Normal arterial flow voids.

Skull and upper cervical spine: Left occipital parietal craniotomy.
No acute skeletal abnormality.

Sinuses/Orbits: Negative

Other: None
IMPRESSION: Significant improvement since the prior MRI. Enhancing mass is
smaller with significantly decreased enhancement. Nonenhancing
hyperintensity surrounding the mass lesion has significantly
improved since the prior study. No contralateral spread.

## 2019-10-27 MED ORDER — GADOBUTROL 1 MMOL/ML IV SOLN
6.0000 mL | Freq: Once | INTRAVENOUS | Status: AC | PRN
  Administered 2019-10-27: 6 mL via INTRAVENOUS

## 2019-10-30 ENCOUNTER — Inpatient Hospital Stay: Payer: BC Managed Care – PPO

## 2019-10-31 ENCOUNTER — Inpatient Hospital Stay: Payer: BC Managed Care – PPO

## 2019-10-31 ENCOUNTER — Other Ambulatory Visit: Payer: Self-pay

## 2019-10-31 ENCOUNTER — Other Ambulatory Visit: Payer: Self-pay | Admitting: Internal Medicine

## 2019-10-31 ENCOUNTER — Inpatient Hospital Stay: Payer: BC Managed Care – PPO | Admitting: Internal Medicine

## 2019-10-31 VITALS — BP 132/86 | HR 103 | Temp 97.5°F | Resp 18 | Ht 62.0 in | Wt 120.1 lb

## 2019-10-31 VITALS — BP 122/77 | HR 88 | Temp 98.2°F | Resp 17

## 2019-10-31 DIAGNOSIS — C719 Malignant neoplasm of brain, unspecified: Secondary | ICD-10-CM

## 2019-10-31 DIAGNOSIS — Z7189 Other specified counseling: Secondary | ICD-10-CM

## 2019-10-31 DIAGNOSIS — Z5112 Encounter for antineoplastic immunotherapy: Secondary | ICD-10-CM | POA: Diagnosis not present

## 2019-10-31 DIAGNOSIS — Z7952 Long term (current) use of systemic steroids: Secondary | ICD-10-CM | POA: Diagnosis not present

## 2019-10-31 DIAGNOSIS — E039 Hypothyroidism, unspecified: Secondary | ICD-10-CM | POA: Diagnosis not present

## 2019-10-31 DIAGNOSIS — Z79899 Other long term (current) drug therapy: Secondary | ICD-10-CM | POA: Diagnosis not present

## 2019-10-31 DIAGNOSIS — E7800 Pure hypercholesterolemia, unspecified: Secondary | ICD-10-CM | POA: Diagnosis not present

## 2019-10-31 DIAGNOSIS — C714 Malignant neoplasm of occipital lobe: Secondary | ICD-10-CM | POA: Diagnosis not present

## 2019-10-31 LAB — TOTAL PROTEIN, URINE DIPSTICK: Protein, ur: NEGATIVE mg/dL

## 2019-10-31 LAB — CMP (CANCER CENTER ONLY)
ALT: 15 U/L (ref 0–44)
AST: 22 U/L (ref 15–41)
Albumin: 3.4 g/dL — ABNORMAL LOW (ref 3.5–5.0)
Alkaline Phosphatase: 83 U/L (ref 38–126)
Anion gap: 10 (ref 5–15)
BUN: 11 mg/dL (ref 8–23)
CO2: 22 mmol/L (ref 22–32)
Calcium: 9.6 mg/dL (ref 8.9–10.3)
Chloride: 108 mmol/L (ref 98–111)
Creatinine: 0.98 mg/dL (ref 0.44–1.00)
GFR, Estimated: >60 mL/min (ref >60)
Glucose, Bld: 109 mg/dL — ABNORMAL HIGH (ref 70–99)
Potassium: 3.5 mmol/L (ref 3.5–5.1)
Sodium: 140 mmol/L (ref 135–145)
Total Bilirubin: 0.5 mg/dL (ref 0.3–1.2)
Total Protein: 6.3 g/dL — ABNORMAL LOW (ref 6.5–8.1)

## 2019-10-31 LAB — CBC WITH DIFFERENTIAL (CANCER CENTER ONLY)
Abs Immature Granulocytes: 0 10*3/uL (ref 0.00–0.07)
Basophils Absolute: 0 10*3/uL (ref 0.0–0.1)
Basophils Relative: 1 %
Eosinophils Absolute: 0 10*3/uL (ref 0.0–0.5)
Eosinophils Relative: 1 %
HCT: 38.4 % (ref 36.0–46.0)
Hemoglobin: 12.9 g/dL (ref 12.0–15.0)
Immature Granulocytes: 0 %
Lymphocytes Relative: 28 %
Lymphs Abs: 1.2 10*3/uL (ref 0.7–4.0)
MCH: 32.1 pg (ref 26.0–34.0)
MCHC: 33.6 g/dL (ref 30.0–36.0)
MCV: 95.5 fL (ref 80.0–100.0)
Monocytes Absolute: 0.9 10*3/uL (ref 0.1–1.0)
Monocytes Relative: 21 %
Neutro Abs: 2.1 10*3/uL (ref 1.7–7.7)
Neutrophils Relative %: 49 %
Platelet Count: 441 10*3/uL — ABNORMAL HIGH (ref 150–400)
RBC: 4.02 MIL/uL (ref 3.87–5.11)
RDW: 15.4 % (ref 11.5–15.5)
WBC Count: 4.3 10*3/uL (ref 4.0–10.5)
nRBC: 0 % (ref 0.0–0.2)

## 2019-10-31 MED ORDER — SODIUM CHLORIDE 0.9 % IV SOLN
Freq: Once | INTRAVENOUS | Status: AC
  Filled 2019-10-31: qty 250

## 2019-10-31 MED ORDER — LOMUSTINE 40 MG PO CAPS: 40.0000 mg | ORAL_CAPSULE | Freq: Once | ORAL | 0 refills | Status: DC

## 2019-10-31 MED ORDER — LOMUSTINE 100 MG PO CAPS: 100.0000 mg | ORAL_CAPSULE | Freq: Once | ORAL | 0 refills | Status: DC

## 2019-10-31 MED ORDER — SODIUM CHLORIDE 0.9 % IV SOLN
10.0000 mg/kg | Freq: Once | INTRAVENOUS | Status: AC
  Administered 2019-10-31: 600 mg via INTRAVENOUS
  Filled 2019-10-31: qty 16

## 2019-10-31 NOTE — Progress Notes (Signed)
[facility] [place], [place] [address] [phone]   Interval Evaluation  Date of Service: 10/31/19 Patient Name: Jillian Gomez Patient MRN: [id number] Patient DOB: Jan 12, 1955 Provider: Ventura Sellers, MD  Identifying Statement:  Jillian Gomez is a 64 y.o. 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 Kathyrn Sheriff.  Path is GBM IDH-wt   01/23/2019 - 03/03/2019 Radiation Therapy   IMRT with concurrent Temozolomide 40m/m2   03/28/2019 - 03/28/2019 Chemotherapy   The patient had temozolomide (TEMODAR) 20 MG capsule, 20 mg (100 % of original dose 20 mg), Oral, Daily, 1 of 1 cycle, Start date: 03/28/2019, End date: 05/30/2019 Dose modification: 20 mg (original dose 20 mg, Cycle 1) temozolomide (TEMODAR) 100 MG capsule, 300 mg, Oral, Daily, 1 of 1 cycle, Start date: 08/01/2019, End date: 09/04/2019 Dose modification: 200 mg (original dose 200 mg, Cycle 1), 300 mg (original dose 200 mg, Cycle 1)  for chemotherapy treatment.    09/04/2019 -  Chemotherapy   The patient had dexamethasone (DECADRON) 4 MG tablet, 8 mg, Oral, Daily, 1 of 1 cycle, Start date: --, End date: -- lomustine (CEENU) 100 MG capsule, 100 mg (100 % of original dose 100 mg), Oral,  Once, 1 of 1 cycle, Start date: 09/04/2019, End date: 09/04/2019 Dose modification: 100 mg (original dose 100 mg, Cycle 1) lomustine (CEENU) 40 MG capsule, 40 mg (100 % of original dose 40 mg), Oral,  Once, 1 of 1 cycle, Start date: 09/04/2019, End date: 09/04/2019 Dose modification: 40 mg (original dose 40 mg, Cycle 1)  for chemotherapy treatment.    09/19/2019 -  Chemotherapy   The patient had bevacizumab-bvzr (ZIRABEV) 600 mg in sodium chloride 0.9 % 100 mL chemo infusion, 10 mg/kg = 600 mg, Intravenous,  Once, 3 of 6  cycles Administration: 600 mg (09/19/2019), 600 mg (10/17/2019), 600 mg (10/03/2019)  for chemotherapy treatment.      Biomarkers:  MGMT Unknown.  IDH 1/2 Wild type.  EGFR Unknown  TERT Unknown   Interval History:  Jillian GAWRONSKIpresents today following recent MRI brain. No clinical changes since prior visit.  Does complain of sluggishness with thinking, affecting her work.  Continues to have deficits with reading. Energy level is still lower overall.  No right sided weakness, but visual impairment on that side persists. No frank seizures or headaches.  H+P (01/09/19) Patient presented to medical attention in early December with ~2 months history of right sided visual impairment.  She describes fuzzy or blurry vision on that side which was progressive over time, leading to at least one fall.  MRI brain demonstrated enhancing left occipital mass; this was subsequently resected by Dr. NKathyrn Sheriffon 12/23/18.  She had no issues with surgery and has completed her steroid taper.  Continues on keppra daily but no history of any seizure. She has returned to work with only modest difficutly.  No issues walking or performing ADLs; lives alone but her sister and other family members are nearby.  Medications: Current Outpatient Medications on File Prior to Visit  Medication Sig Dispense Refill  . atorvastatin (LIPITOR) 40 MG tablet Take 20 mg by mouth daily.    . Biotin 1 MG CAPS Take 1 mg by mouth daily.    .Marland KitchenCALCIUM PO Take 1 tablet by mouth daily.    .Marland Kitchen  ciprofloxacin (CIPRO) 500 MG tablet Take 1 tablet (500 mg total) by mouth 2 (two) times daily. 14 tablet 0  . dexamethasone (DECADRON) 2 MG tablet Take 2 tablets (4 mg total) by mouth daily. 60 tablet 1  . EUTHYROX 50 MCG tablet Take 50 mcg by mouth daily.    . naproxen sodium (ALEVE) 220 MG tablet Take 1 tablet (220 mg total) by mouth daily as needed (pain).    . ondansetron (ZOFRAN) 8 MG tablet Take 1 tablet (8 mg total) by mouth 2 (two) times daily  as needed. Start on the third day after chemotherapy. 30 tablet 1  . potassium chloride SA (KLOR-CON) 10 MEQ tablet Take 1 tablet (10 mEq total) by mouth daily. 30 tablet 3   No current facility-administered medications on file prior to visit.    Allergies:  Allergies  Allergen Reactions  . Etodolac Other (See Comments)    GI upset  . Macrobid [Nitrofurantoin] Rash   Past Medical History:  Past Medical History:  Diagnosis Date  . High cholesterol   . Hypothyroidism   . Joint pain    Past Surgical History:  Past Surgical History:  Procedure Laterality Date  . APPLICATION OF CRANIAL NAVIGATION Left 12/23/2018   Procedure: APPLICATION OF CRANIAL NAVIGATION;  Surgeon: Consuella Lose, MD;  Location: [facility];  Service: Neurosurgery;  Laterality: Left;  APPLICATION OF CRANIAL NAVIGATION  . BREAST BIOPSY Left   . COLONOSCOPY    . CRANIOTOMY Left 12/23/2018   Procedure: STEREOTACTIC LEFT OCCIPITAL CRANIOTOMY FOR RESECTION OF TUMOR;  Surgeon: Consuella Lose, MD;  Location: [facility];  Service: Neurosurgery;  Laterality: Left;  STEREOTACTIC LEFT OCCIPITAL CRANIOTOMY FOR RESECTION OF TUMOR  . LASIK     Social History:  Social History   Socioeconomic History  . Marital status: Single    Spouse name: Not on file  . Number of children: Not on file  . Years of education: Not on file  . Highest education level: Not on file  Occupational History  . Not on file  Tobacco Use  . Smoking status: Never Smoker  . Smokeless tobacco: Never Used  Vaping Use  . Vaping Use: Never used  Substance and Sexual Activity  . Alcohol use: Not Currently  . Drug use: Never  . Sexual activity: Not on file  Other Topics Concern  . Not on file  Social History Narrative  . Not on file   Social Determinants of Health   Financial Resource Strain:   . Difficulty of Paying Living Expenses: Not on file  Food Insecurity:   . Worried About Charity fundraiser in the Last Year: Not on file  . Ran Out of  Food in the Last Year: Not on file  Transportation Needs:   . Lack of Transportation (Medical): Not on file  . Lack of Transportation (Non-Medical): Not on file  Physical Activity:   . Days of Exercise per Week: Not on file  . Minutes of Exercise per Session: Not on file  Stress:   . Feeling of Stress : Not on file  Social Connections:   . Frequency of Communication with Friends and Family: Not on file  . Frequency of Social Gatherings with Friends and Family: Not on file  . Attends Religious Services: Not on file  . Active Member of Clubs or Organizations: Not on file  . Attends Archivist Meetings: Not on file  . Marital Status: Not on file  Intimate Partner Violence:   .  Fear of Current or Ex-Partner: Not on file  . Emotionally Abused: Not on file  . Physically Abused: Not on file  . Sexually Abused: Not on file   Family History: No family history on file.  Review of Systems: Constitutional: Denies fevers, chills or abnormal weight loss Eyes: Denies blurriness of vision Gastrointestinal:  Denies nausea, constipation, diarrhea GU: Denies dysuria or incontinence Skin: Denies abnormal skin rashes Musculoskeletal: Denies joint pain, back or neck discomfort.  Behavioral/Psych: Denies anxiety, mood instability  Physical Exam: Vitals:   10/31/19 1149  BP: 132/86  Pulse: (!) 103  Resp: 18  Temp: (!) 97.5 F (36.4 C)  SpO2: 100%   KPS: 80. General: Alert, cooperative, pleasant, in no acute distress Head: Craniotomy scar noted, dry and intact. EENT: No conjunctival injection or scleral icterus. Oral mucosa moist Lungs: Resp effort normal Cardiac: Regular rate and rhythm Abdomen: Soft, non-distended abdomen Skin: No rashes cyanosis or petechiae. Extremities: No clubbing or edema  Neurologic Exam: Mental Status: Awake, alert, attentive to examiner. Oriented to self and environment. Language is notable for mild transcortical expressive dyshpasia. Cranial Nerves:  Visual acuity is grossly normal. Right homonymous hemianopia. Extra-ocular movements intact. No ptosis. Face is symmetric, tongue midline. Motor: Tone and bulk are normal. Power is full in both arms and legs. Reflexes are symmetric, no pathologic reflexes present. Intact finger to nose bilaterally Sensory: Intact to light touch and temperature Gait: Normal and tandem gait is deferred.   Labs: I have reviewed the data as listed    Component Value Date/Time   NA 142 10/17/2019 1017   K 3.5 10/17/2019 1017   CL 111 10/17/2019 1017   CO2 25 10/17/2019 1017   GLUCOSE 89 10/17/2019 1017   BUN 14 10/17/2019 1017   CREATININE 0.84 10/17/2019 1017   CALCIUM 9.8 10/17/2019 1017   PROT 6.4 (L) 10/17/2019 1017   ALBUMIN 3.1 (L) 10/17/2019 1017   AST 28 10/17/2019 1017   ALT 25 10/17/2019 1017   ALKPHOS 91 10/17/2019 1017   BILITOT 0.8 10/17/2019 1017   GFRNONAA >60 10/17/2019 1017   GFRAA >60 10/03/2019 1133   Lab Results  Component Value Date   WBC 4.3 10/31/2019   NEUTROABS 2.1 10/31/2019   HGB 12.9 10/31/2019   HCT 38.4 10/31/2019   MCV 95.5 10/31/2019   PLT 441 (H) 10/31/2019   Imaging:  [facility] Clinician Interpretation: I have personally reviewed the CNS images as listed.  My interpretation, in the context of the patient's clinical presentation, is stable disease  MR BRAIN W WO CONTRAST  Result Date: 10/27/2019 CLINICAL DATA:  Glioblastoma. Post resection, radiation treatment. Currently CCNU and Avastin treatment. EXAM: MRI HEAD WITHOUT AND WITH CONTRAST TECHNIQUE: Multiplanar, multiecho pulse sequences of the brain and surrounding structures were obtained without and with intravenous contrast. CONTRAST:  79m GADAVIST GADOBUTROL 1 MMOL/ML IV SOLN COMPARISON:  MRI head 09/01/2019 FINDINGS: Brain: Significant improvement in mass in the left occipital parietal lobe. The enhancing mass at the biopsy site has decreased in size with significantly decreased enhancement. Nonenhancing white  matter hyperintensity shows marked interval improvement. There is mild hyperintensity in the left splenium without contralateral spread of tumor. Mass extends to the occipital horn with decreased mass-effect. Chronic mild hemorrhage at the resection site unchanged. Ventricle size normal. No midline shift. Negative for acute infarct. Abnormal diffusion corresponds to the areas of enhancement. Vascular: Normal arterial flow voids. Skull and upper cervical spine: Left occipital parietal craniotomy. No acute skeletal abnormality. Sinuses/Orbits: Negative Other:  None IMPRESSION: Significant improvement since the prior MRI. Enhancing mass is smaller with significantly decreased enhancement. Nonenhancing hyperintensity surrounding the mass lesion has significantly improved since the prior study. No contralateral spread. Electronically Signed   By: Franchot Gallo M.D.   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  We are encouraged by good response seen on brain MRI.  She is cleared for avastin infusion after review of labs, resolution of thrombocytopenia and neutropenia noted.  We recommended continuing second-line treatment with cycle #2 oral CCNU 14m/m2 q6 weeks with concurrent Avastin IV 152mkg.  Chemotherapy should be held for the following:  ANC less than 1,000  Platelets less than 100,000  LFT or creatinine greater than 2x ULN  If clinical concerns/contraindications develop  Avastin should be held for the following:  ANC less than 500  Platelets less than 50,000  LFT or creatinine greater than 2x ULN  If clinical concerns/contraindications develop  Should stay off dexamethasone.  We ask that Jillian EMSeturn to clinic in 2 weeks for next avastin infusion.  All questions were answered. The patient knows to call the clinic with any problems,  questions or concerns. No barriers to learning were detected.  I have spent a total of 40 minutes of face-to-face and non-face-to-face time, excluding clinical staff time, preparing to see patient, ordering tests and/or medications, counseling the patient, and independently interpreting results and communicating results to the patient/family/caregiver    ZaVentura SellersMD Medical Director of Neuro-Oncology [facility]t We[place]ott[place]0/26/21 11:40 AM

## 2019-10-31 NOTE — Patient Instructions (Signed)
[facility] Discharge Instructions for Patients Receiving Chemotherapy  Today you received the following chemotherapy agents: bevacizumab  To help prevent nausea and vomiting after your treatment, we encourage you to take your nausea medication as directed.   If you develop nausea and vomiting that is not controlled by your nausea medication, call the clinic.   BELOW ARE SYMPTOMS THAT SHOULD BE REPORTED IMMEDIATELY:  *FEVER GREATER THAN 100.5 F  *CHILLS WITH OR WITHOUT FEVER  NAUSEA AND VOMITING THAT IS NOT CONTROLLED WITH YOUR NAUSEA MEDICATION  *UNUSUAL SHORTNESS OF BREATH  *UNUSUAL BRUISING OR BLEEDING  TENDERNESS IN MOUTH AND THROAT WITH OR WITHOUT PRESENCE OF ULCERS  *URINARY PROBLEMS  *BOWEL PROBLEMS  UNUSUAL RASH Items with * indicate a potential emergency and should be followed up as soon as possible.  Feel free to call the clinic should you have any questions or concerns. The clinic phone number is [phone].  Please show the CHEMO ALERT CARD at check-in to the Emergency Department and triage nurse.   

## 2019-11-01 ENCOUNTER — Telehealth: Payer: Self-pay | Admitting: Internal Medicine

## 2019-11-01 MED FILL — GLEOSTINE 100 MG CAPSULE: 100 | 1 days supply | Qty: 1 | Fill #0

## 2019-11-01 MED FILL — GLEOSTINE 40 MG CAPSULE: 40 | 1 days supply | Qty: 1 | Fill #0

## 2019-11-01 NOTE — Telephone Encounter (Signed)
Scheduled per 10/26 los. Pt is aware of appt times and date.

## 2019-11-14 ENCOUNTER — Inpatient Hospital Stay: Payer: BC Managed Care – PPO

## 2019-11-14 ENCOUNTER — Inpatient Hospital Stay: Payer: BC Managed Care – PPO | Attending: Internal Medicine | Admitting: Internal Medicine

## 2019-11-14 ENCOUNTER — Other Ambulatory Visit: Payer: Self-pay

## 2019-11-14 VITALS — HR 87

## 2019-11-14 VITALS — BP 122/87 | HR 109 | Temp 97.6°F | Resp 17 | Ht 62.0 in | Wt 117.0 lb

## 2019-11-14 DIAGNOSIS — Z5112 Encounter for antineoplastic immunotherapy: Secondary | ICD-10-CM | POA: Insufficient documentation

## 2019-11-14 DIAGNOSIS — C719 Malignant neoplasm of brain, unspecified: Secondary | ICD-10-CM

## 2019-11-14 DIAGNOSIS — Z79899 Other long term (current) drug therapy: Secondary | ICD-10-CM | POA: Diagnosis not present

## 2019-11-14 DIAGNOSIS — Z923 Personal history of irradiation: Secondary | ICD-10-CM | POA: Insufficient documentation

## 2019-11-14 DIAGNOSIS — C714 Malignant neoplasm of occipital lobe: Secondary | ICD-10-CM | POA: Insufficient documentation

## 2019-11-14 DIAGNOSIS — E039 Hypothyroidism, unspecified: Secondary | ICD-10-CM | POA: Insufficient documentation

## 2019-11-14 DIAGNOSIS — Z23 Encounter for immunization: Secondary | ICD-10-CM

## 2019-11-14 DIAGNOSIS — E7800 Pure hypercholesterolemia, unspecified: Secondary | ICD-10-CM | POA: Diagnosis not present

## 2019-11-14 LAB — CMP (CANCER CENTER ONLY)
ALT: 8 U/L (ref 0–44)
AST: 20 U/L (ref 15–41)
Albumin: 3.5 g/dL (ref 3.5–5.0)
Alkaline Phosphatase: 69 U/L (ref 38–126)
Anion gap: 9 (ref 5–15)
BUN: 16 mg/dL (ref 8–23)
CO2: 23 mmol/L (ref 22–32)
Calcium: 9.8 mg/dL (ref 8.9–10.3)
Chloride: 107 mmol/L (ref 98–111)
Creatinine: 0.93 mg/dL (ref 0.44–1.00)
GFR, Estimated: >60 mL/min (ref >60)
Glucose, Bld: 119 mg/dL — ABNORMAL HIGH (ref 70–99)
Potassium: 4 mmol/L (ref 3.5–5.1)
Sodium: 139 mmol/L (ref 135–145)
Total Bilirubin: 0.8 mg/dL (ref 0.3–1.2)
Total Protein: 6.4 g/dL — ABNORMAL LOW (ref 6.5–8.1)

## 2019-11-14 LAB — CBC WITH DIFFERENTIAL (CANCER CENTER ONLY)
Abs Immature Granulocytes: 0.01 10*3/uL (ref 0.00–0.07)
Basophils Absolute: 0 10*3/uL (ref 0.0–0.1)
Basophils Relative: 0 %
Eosinophils Absolute: 0 10*3/uL (ref 0.0–0.5)
Eosinophils Relative: 0 %
HCT: 40.6 % (ref 36.0–46.0)
Hemoglobin: 13.8 g/dL (ref 12.0–15.0)
Immature Granulocytes: 0 %
Lymphocytes Relative: 18 %
Lymphs Abs: 0.8 10*3/uL (ref 0.7–4.0)
MCH: 32.2 pg (ref 26.0–34.0)
MCHC: 34 g/dL (ref 30.0–36.0)
MCV: 94.9 fL (ref 80.0–100.0)
Monocytes Absolute: 0.5 10*3/uL (ref 0.1–1.0)
Monocytes Relative: 11 %
Neutro Abs: 3.1 10*3/uL (ref 1.7–7.7)
Neutrophils Relative %: 71 %
Platelet Count: 266 10*3/uL (ref 150–400)
RBC: 4.28 MIL/uL (ref 3.87–5.11)
RDW: 14.4 % (ref 11.5–15.5)
WBC Count: 4.5 10*3/uL (ref 4.0–10.5)
nRBC: 0 % (ref 0.0–0.2)

## 2019-11-14 LAB — TOTAL PROTEIN, URINE DIPSTICK

## 2019-11-14 MED ORDER — SODIUM CHLORIDE 0.9 % IV SOLN
Freq: Once | INTRAVENOUS | Status: AC
  Filled 2019-11-14: qty 250

## 2019-11-14 MED ORDER — SODIUM CHLORIDE 0.9 % IV SOLN
10.0000 mg/kg | Freq: Once | INTRAVENOUS | Status: AC
  Administered 2019-11-14: 600 mg via INTRAVENOUS
  Filled 2019-11-14: qty 16

## 2019-11-14 MED ORDER — INFLUENZA VAC SPLIT QUAD 0.5 ML IM SUSY
0.5000 mL | PREFILLED_SYRINGE | Freq: Once | INTRAMUSCULAR | Status: AC
  Administered 2019-11-14: 0.5 mL via INTRAMUSCULAR

## 2019-11-14 MED ORDER — INFLUENZA VAC SPLIT QUAD 0.5 ML IM SUSY
PREFILLED_SYRINGE | INTRAMUSCULAR | Status: AC
  Filled 2019-11-14: qty 0.5

## 2019-11-14 NOTE — Patient Instructions (Signed)
[facility] Center Discharge Instructions for Patients Receiving Chemotherapy  Today you received the following chemotherapy agents Bevacizumab  To help prevent nausea and vomiting after your treatment, we encourage you to take your nausea medication as directed   If you develop nausea and vomiting that is not controlled by your nausea medication, call the clinic.   BELOW ARE SYMPTOMS THAT SHOULD BE REPORTED IMMEDIATELY:  *FEVER GREATER THAN 100.5 F  *CHILLS WITH OR WITHOUT FEVER  NAUSEA AND VOMITING THAT IS NOT CONTROLLED WITH YOUR NAUSEA MEDICATION  *UNUSUAL SHORTNESS OF BREATH  *UNUSUAL BRUISING OR BLEEDING  TENDERNESS IN MOUTH AND THROAT WITH OR WITHOUT PRESENCE OF ULCERS  *URINARY PROBLEMS  *BOWEL PROBLEMS  UNUSUAL RASH Items with * indicate a potential emergency and should be followed up as soon as possible.  Feel free to call the clinic should you have any questions or concerns. The clinic phone number is [phone].  Please show the CHEMO ALERT CARD at check-in to the Emergency Department and triage nurse.   

## 2019-11-14 NOTE — Progress Notes (Signed)
[place] at [place], [place] [address] [phone]   Interval Evaluation  Date of Service: 11/14/19 Patient Name: Jillian Gomez Patient MRN: [id number] Patient DOB: February 22, 1955 Provider: Ventura Sellers, MD  Identifying Statement:  Jillian Gomez is a 64 y.o. female with left occipital glioblastoma   Oncologic History: Oncology History  Glioblastoma with isocitrate dehydrogenase gene wildtype ([facility])  12/23/2018 Surgery   Craniotomy, left occipital resection by Dr. Kathyrn Sheriff.  Path is GBM IDH-wt   01/23/2019 - 03/03/2019 Radiation Therapy   IMRT with concurrent Temozolomide 62m/m2   03/28/2019 - 03/28/2019 Chemotherapy   The patient had temozolomide (TEMODAR) 20 MG capsule, 20 mg (100 % of original dose 20 mg), Oral, Daily, 1 of 1 cycle, Start date: 03/28/2019, End date: 05/30/2019 Dose modification: 20 mg (original dose 20 mg, Cycle 1) temozolomide (TEMODAR) 100 MG capsule, 300 mg, Oral, Daily, 1 of 1 cycle, Start date: 08/01/2019, End date: 09/04/2019 Dose modification: 200 mg (original dose 200 mg, Cycle 1), 300 mg (original dose 200 mg, Cycle 1)  for chemotherapy treatment.    09/04/2019 -  Chemotherapy   The patient had dexamethasone (DECADRON) 4 MG tablet, 8 mg, Oral, Daily, 1 of 1 cycle, Start date: --, End date: -- lomustine (CEENU) 100 MG capsule, 100 mg (100 % of original dose 100 mg), Oral,  Once, 1 of 1 cycle, Start date: 09/04/2019, End date: 09/04/2019 Dose modification: 100 mg (original dose 100 mg, Cycle 1) lomustine (CEENU) 40 MG capsule, 40 mg (100 % of original dose 40 mg), Oral,  Once, 1 of 1 cycle, Start date: 09/04/2019, End date: 09/04/2019 Dose modification: 40 mg (original dose 40 mg, Cycle 1)  for chemotherapy treatment.    09/19/2019 -  Chemotherapy   The patient had bevacizumab-bvzr (ZIRABEV) 600 mg in sodium chloride 0.9 % 100 mL chemo infusion, 10 mg/kg = 600 mg, Intravenous,  Once, 4 of 6  cycles Administration: 600 mg (09/19/2019), 600 mg (10/17/2019), 600 mg (10/31/2019), 600 mg (10/03/2019)  for chemotherapy treatment.      Biomarkers:  MGMT Unknown.  IDH 1/2 Wild type.  EGFR Unknown  TERT Unknown   Interval History:  Jillian PFEIFERpresents today for avastin infusion. Continues to complain of sluggishness with thinking, unchanged from prior.  Continues to have deficits with reading. Energy level is still lower overall.  No right sided weakness, but visual impairment on that side persists. Denies seizures or headaches.  H+P (01/09/19) Patient presented to medical attention in early December with ~2 months history of right sided visual impairment.  She describes fuzzy or blurry vision on that side which was progressive over time, leading to at least one fall.  MRI brain demonstrated enhancing left occipital mass; this was subsequently resected by Dr. NKathyrn Sheriffon 12/23/18.  She had no issues with surgery and has completed her steroid taper.  Continues on keppra daily but no history of any seizure. She has returned to work with only modest difficutly.  No issues walking or performing ADLs; lives alone but her sister and other family members are nearby.  Medications: Current Outpatient Medications on File Prior to Visit  Medication Sig Dispense Refill  . atorvastatin (LIPITOR) 40 MG tablet Take 20 mg by mouth daily.    . Biotin 1 MG CAPS Take 1 mg by mouth daily.    .Marland KitchenCALCIUM PO Take 1 tablet by mouth daily.    . EUTHYROX 50 MCG tablet Take  50 mcg by mouth daily.    . naproxen sodium (ALEVE) 220 MG tablet Take 1 tablet (220 mg total) by mouth daily as needed (pain).    . ondansetron (ZOFRAN) 8 MG tablet Take 1 tablet (8 mg total) by mouth 2 (two) times daily as needed. Start on the third day after chemotherapy. 30 tablet 1  . potassium chloride SA (KLOR-CON) 10 MEQ tablet Take 1 tablet (10 mEq total) by mouth daily. 30 tablet 3   No current facility-administered medications on  file prior to visit.    Allergies:  Allergies  Allergen Reactions  . Etodolac Other (See Comments)    GI upset  . Macrobid [Nitrofurantoin] Rash   Past Medical History:  Past Medical History:  Diagnosis Date  . High cholesterol   . Hypothyroidism   . Joint pain    Past Surgical History:  Past Surgical History:  Procedure Laterality Date  . APPLICATION OF CRANIAL NAVIGATION Left 12/23/2018   Procedure: APPLICATION OF CRANIAL NAVIGATION;  Surgeon: Consuella Lose, MD;  Location: [facility];  Service: Neurosurgery;  Laterality: Left;  APPLICATION OF CRANIAL NAVIGATION  . BREAST BIOPSY Left   . COLONOSCOPY    . CRANIOTOMY Left 12/23/2018   Procedure: STEREOTACTIC LEFT OCCIPITAL CRANIOTOMY FOR RESECTION OF TUMOR;  Surgeon: Consuella Lose, MD;  Location: [facility];  Service: Neurosurgery;  Laterality: Left;  STEREOTACTIC LEFT OCCIPITAL CRANIOTOMY FOR RESECTION OF TUMOR  . LASIK     Social History:  Social History   Socioeconomic History  . Marital status: Single    Spouse name: Not on file  . Number of children: Not on file  . Years of education: Not on file  . Highest education level: Not on file  Occupational History  . Not on file  Tobacco Use  . Smoking status: Never Smoker  . Smokeless tobacco: Never Used  Vaping Use  . Vaping Use: Never used  Substance and Sexual Activity  . Alcohol use: Not Currently  . Drug use: Never  . Sexual activity: Not on file  Other Topics Concern  . Not on file  Social History Narrative  . Not on file   Social Determinants of Health   Financial Resource Strain:   . Difficulty of Paying Living Expenses: Not on file  Food Insecurity:   . Worried About Charity fundraiser in the Last Year: Not on file  . Ran Out of Food in the Last Year: Not on file  Transportation Needs:   . Lack of Transportation (Medical): Not on file  . Lack of Transportation (Non-Medical): Not on file  Physical Activity:   . Days of Exercise per Week: Not on  file  . Minutes of Exercise per Session: Not on file  Stress:   . Feeling of Stress : Not on file  Social Connections:   . Frequency of Communication with Friends and Family: Not on file  . Frequency of Social Gatherings with Friends and Family: Not on file  . Attends Religious Services: Not on file  . Active Member of Clubs or Organizations: Not on file  . Attends Archivist Meetings: Not on file  . Marital Status: Not on file  Intimate Partner Violence:   . Fear of Current or Ex-Partner: Not on file  . Emotionally Abused: Not on file  . Physically Abused: Not on file  . Sexually Abused: Not on file   Family History: No family history on file.  Review of Systems: Constitutional: Denies fevers, chills  or abnormal weight loss Eyes: Denies blurriness of vision Gastrointestinal:  Denies nausea, constipation, diarrhea GU: Denies dysuria or incontinence Skin: Denies abnormal skin rashes Musculoskeletal: Denies joint pain, back or neck discomfort.  Behavioral/Psych: Denies anxiety, mood instability  Physical Exam: Vitals:   11/14/19 1212  BP: 122/87  Pulse: (!) 109  Resp: 17  Temp: 97.6 F (36.4 C)  SpO2: 100%   KPS: 80. General: Alert, cooperative, pleasant, in no acute distress Head: Craniotomy scar noted, dry and intact. EENT: No conjunctival injection or scleral icterus. Oral mucosa moist Lungs: Resp effort normal Cardiac: Regular rate and rhythm Abdomen: Soft, non-distended abdomen Skin: No rashes cyanosis or petechiae. Extremities: No clubbing or edema  Neurologic Exam: Mental Status: Awake, alert, attentive to examiner. Oriented to self and environment. Language is notable for mild transcortical expressive dyshpasia. Cranial Nerves: Visual acuity is grossly normal. Right homonymous hemianopia. Extra-ocular movements intact. No ptosis. Face is symmetric, tongue midline. Motor: Tone and bulk are normal. Power is full in both arms and legs. Reflexes are  symmetric, no pathologic reflexes present. Intact finger to nose bilaterally Sensory: Intact to light touch and temperature Gait: Normal and tandem gait is deferred.   Labs: I have reviewed the data as listed    Component Value Date/Time   NA 140 10/31/2019 1116   K 3.5 10/31/2019 1116   CL 108 10/31/2019 1116   CO2 22 10/31/2019 1116   GLUCOSE 109 (H) 10/31/2019 1116   BUN 11 10/31/2019 1116   CREATININE 0.98 10/31/2019 1116   CALCIUM 9.6 10/31/2019 1116   PROT 6.3 (L) 10/31/2019 1116   ALBUMIN 3.4 (L) 10/31/2019 1116   AST 22 10/31/2019 1116   ALT 15 10/31/2019 1116   ALKPHOS 83 10/31/2019 1116   BILITOT 0.5 10/31/2019 1116   GFRNONAA >60 10/31/2019 1116   GFRAA >60 10/03/2019 1133   Lab Results  Component Value Date   WBC 4.5 11/14/2019   NEUTROABS 3.1 11/14/2019   HGB 13.8 11/14/2019   HCT 40.6 11/14/2019   MCV 94.9 11/14/2019   PLT 266 11/14/2019    Assessment/Plan Glioblastoma with isocitrate dehydrogenase gene wildtype (HCC) [C71.9]   SHERIE DOBROWOLSKI is clinically stable today.  Labs are within normal limits, she is clear to dose avastin today.  We recommended continuing second-line treatment with cycle #2 oral CCNU 15m/m2 q6 weeks (day 10/42) with concurrent Avastin IV 128mkg.  Chemotherapy should be held for the following:  ANC less than 1,000  Platelets less than 100,000  LFT or creatinine greater than 2x ULN  If clinical concerns/contraindications develop  Avastin should be held for the following:  ANC less than 500  Platelets less than 50,000  LFT or creatinine greater than 2x ULN  If clinical concerns/contraindications develop  Can remain off dexamethasone.  We ask that RoPLESHETTE TOMASINIeturn to clinic in 3 weeks for next avastin infusion.  All questions were answered. The patient knows to call the clinic with any problems, questions or concerns. No barriers to learning were detected.  I have spent a total of 30 minutes of face-to-face  and non-face-to-face time, excluding clinical staff time, preparing to see patient, ordering tests and/or medications, counseling the patient, and independently interpreting results and communicating results to the patient/family/caregiver    ZaVentura SellersMD Medical Director of Neuro-Oncology [facility]Pl[facility] WeHawaiian Beaches1/09/21 12:17 PM

## 2019-11-15 ENCOUNTER — Telehealth: Payer: Self-pay | Admitting: Internal Medicine

## 2019-11-15 NOTE — Telephone Encounter (Signed)
Scheduled per 11/9 los. Unable to reach pt. Left voicemail with appt times and date.

## 2019-12-05 ENCOUNTER — Inpatient Hospital Stay: Payer: BC Managed Care – PPO | Admitting: Internal Medicine

## 2019-12-05 ENCOUNTER — Inpatient Hospital Stay: Payer: BC Managed Care – PPO

## 2019-12-05 ENCOUNTER — Other Ambulatory Visit: Payer: Self-pay

## 2019-12-05 VITALS — HR 99

## 2019-12-05 VITALS — BP 146/90 | HR 120 | Temp 96.9°F | Resp 18 | Ht 62.0 in | Wt 113.9 lb

## 2019-12-05 DIAGNOSIS — Z5112 Encounter for antineoplastic immunotherapy: Secondary | ICD-10-CM | POA: Diagnosis not present

## 2019-12-05 DIAGNOSIS — Z23 Encounter for immunization: Secondary | ICD-10-CM | POA: Diagnosis not present

## 2019-12-05 DIAGNOSIS — C714 Malignant neoplasm of occipital lobe: Secondary | ICD-10-CM | POA: Diagnosis not present

## 2019-12-05 DIAGNOSIS — C719 Malignant neoplasm of brain, unspecified: Secondary | ICD-10-CM

## 2019-12-05 DIAGNOSIS — Z79899 Other long term (current) drug therapy: Secondary | ICD-10-CM | POA: Diagnosis not present

## 2019-12-05 DIAGNOSIS — Z923 Personal history of irradiation: Secondary | ICD-10-CM | POA: Diagnosis not present

## 2019-12-05 DIAGNOSIS — E039 Hypothyroidism, unspecified: Secondary | ICD-10-CM | POA: Diagnosis not present

## 2019-12-05 DIAGNOSIS — E7800 Pure hypercholesterolemia, unspecified: Secondary | ICD-10-CM | POA: Diagnosis not present

## 2019-12-05 LAB — CBC WITH DIFFERENTIAL (CANCER CENTER ONLY)
Abs Immature Granulocytes: 0.01 10*3/uL (ref 0.00–0.07)
Basophils Absolute: 0 10*3/uL (ref 0.0–0.1)
Basophils Relative: 0 %
Eosinophils Absolute: 0.1 10*3/uL (ref 0.0–0.5)
Eosinophils Relative: 3 %
HCT: 34.7 % — ABNORMAL LOW (ref 36.0–46.0)
Hemoglobin: 11.7 g/dL — ABNORMAL LOW (ref 12.0–15.0)
Immature Granulocytes: 0 %
Lymphocytes Relative: 20 %
Lymphs Abs: 1 10*3/uL (ref 0.7–4.0)
MCH: 32.6 pg (ref 26.0–34.0)
MCHC: 33.7 g/dL (ref 30.0–36.0)
MCV: 96.7 fL (ref 80.0–100.0)
Monocytes Absolute: 0.2 10*3/uL (ref 0.1–1.0)
Monocytes Relative: 4 %
Neutro Abs: 3.5 10*3/uL (ref 1.7–7.7)
Neutrophils Relative %: 73 %
Platelet Count: 85 10*3/uL — ABNORMAL LOW (ref 150–400)
RBC: 3.59 MIL/uL — ABNORMAL LOW (ref 3.87–5.11)
RDW: 14.7 % (ref 11.5–15.5)
WBC Count: 4.8 10*3/uL (ref 4.0–10.5)
nRBC: 0 % (ref 0.0–0.2)

## 2019-12-05 LAB — CMP (CANCER CENTER ONLY)
ALT: 16 U/L (ref 0–44)
AST: 26 U/L (ref 15–41)
Albumin: 3.6 g/dL (ref 3.5–5.0)
Alkaline Phosphatase: 76 U/L (ref 38–126)
Anion gap: 11 (ref 5–15)
BUN: 25 mg/dL — ABNORMAL HIGH (ref 8–23)
CO2: 19 mmol/L — ABNORMAL LOW (ref 22–32)
Calcium: 10 mg/dL (ref 8.9–10.3)
Chloride: 110 mmol/L (ref 98–111)
Creatinine: 0.93 mg/dL (ref 0.44–1.00)
GFR, Estimated: >60 mL/min (ref >60)
Glucose, Bld: 104 mg/dL — ABNORMAL HIGH (ref 70–99)
Potassium: 3.9 mmol/L (ref 3.5–5.1)
Sodium: 140 mmol/L (ref 135–145)
Total Bilirubin: 0.9 mg/dL (ref 0.3–1.2)
Total Protein: 6.8 g/dL (ref 6.5–8.1)

## 2019-12-05 LAB — TOTAL PROTEIN, URINE DIPSTICK: Protein, ur: NEGATIVE mg/dL

## 2019-12-05 MED ORDER — SODIUM CHLORIDE 0.9 % IV SOLN
10.0000 mg/kg | Freq: Once | INTRAVENOUS | Status: AC
  Administered 2019-12-05: 500 mg via INTRAVENOUS
  Filled 2019-12-05: qty 16

## 2019-12-05 MED ORDER — SODIUM CHLORIDE 0.9 % IV SOLN
Freq: Once | INTRAVENOUS | Status: AC
  Filled 2019-12-05: qty 250

## 2019-12-05 NOTE — Progress Notes (Signed)
[facility] at [address] [place], [place] [address] [phone]   Interval Evaluation  Date of Service: 12/05/19 Patient Name: Jillian Gomez Patient MRN: [id number] Patient DOB: 1955/07/17 Provider: Ventura Sellers, MD  Identifying Statement:  Jillian Gomez is a 64 y.o. female with left occipital glioblastoma   Oncologic History: Oncology History  Glioblastoma with isocitrate dehydrogenase gene wildtype ([place])  12/23/2018 Surgery   Craniotomy, left occipital resection by Dr. Kathyrn Sheriff.  Path is GBM IDH-wt   01/23/2019 - 03/03/2019 Radiation Therapy   IMRT with concurrent Temozolomide 24m/m2   03/28/2019 - 03/28/2019 Chemotherapy   The patient had temozolomide (TEMODAR) 20 MG capsule, 20 mg (100 % of original dose 20 mg), Oral, Daily, 1 of 1 cycle, Start date: 03/28/2019, End date: 05/30/2019 Dose modification: 20 mg (original dose 20 mg, Cycle 1) temozolomide (TEMODAR) 100 MG capsule, 300 mg, Oral, Daily, 1 of 1 cycle, Start date: 08/01/2019, End date: 09/04/2019 Dose modification: 200 mg (original dose 200 mg, Cycle 1), 300 mg (original dose 200 mg, Cycle 1)  for chemotherapy treatment.    09/04/2019 -  Chemotherapy   The patient had dexamethasone (DECADRON) 4 MG tablet, 8 mg, Oral, Daily, 1 of 1 cycle, Start date: --, End date: -- lomustine (CEENU) 100 MG capsule, 100 mg (100 % of original dose 100 mg), Oral,  Once, 1 of 1 cycle, Start date: 09/04/2019, End date: 09/04/2019 Dose modification: 100 mg (original dose 100 mg, Cycle 1) lomustine (CEENU) 40 MG capsule, 40 mg (100 % of original dose 40 mg), Oral,  Once, 1 of 1 cycle, Start date: 09/04/2019, End date: 09/04/2019 Dose modification: 40 mg (original dose 40 mg, Cycle 1)  for chemotherapy treatment.    09/19/2019 -  Chemotherapy   The patient had bevacizumab-bvzr (ZIRABEV) 600 mg in sodium chloride 0.9 % 100 mL chemo infusion, 10 mg/kg = 600 mg, Intravenous,  Once, 5 of 6  cycles Administration: 600 mg (09/19/2019), 600 mg (10/17/2019), 600 mg (10/31/2019), 600 mg (10/03/2019), 600 mg (11/14/2019)  for chemotherapy treatment.      Biomarkers:  MGMT Unknown.  IDH 1/2 Wild type.  EGFR Unknown  TERT Unknown   Interval History:  Jillian Gomez today for avastin infusion. Still complains of generalized fatigue, unchanged from prior.  Continues to have deficits with reading.  No right sided weakness, but visual impairment on that side persists. No seizures or headaches.  H+P (01/09/19) Patient presented to medical attention in early December with ~2 months history of right sided visual impairment.  She describes fuzzy or blurry vision on that side which was progressive over time, leading to at least one fall.  MRI brain demonstrated enhancing left occipital mass; this was subsequently resected by Dr. NKathyrn Gomez 12/23/18.  She had no issues with surgery and has completed her steroid taper.  Continues on keppra daily but no history of any seizure. She has returned to work with only modest difficutly.  No issues walking or performing ADLs; lives alone but her sister and other family members are nearby.  Medications: Current Outpatient Medications on File Prior to Visit  Medication Sig Dispense Refill  . atorvastatin (LIPITOR) 40 MG tablet Take 20 mg by mouth daily.    . Biotin 1 MG CAPS Take 1 mg by mouth daily.    .Marland KitchenCALCIUM PO Take 1 tablet by mouth daily.    .Arna Medici50 MCG tablet Take 50 mcg by mouth daily.    .Marland Kitchen  naproxen sodium (ALEVE) 220 MG tablet Take 1 tablet (220 mg total) by mouth daily as needed (pain).    . ondansetron (ZOFRAN) 8 MG tablet Take 1 tablet (8 mg total) by mouth 2 (two) times daily as needed. Start on the third day after chemotherapy. 30 tablet 1  . potassium chloride SA (KLOR-CON) 10 MEQ tablet Take 1 tablet (10 mEq total) by mouth daily. 30 tablet 3   No current facility-administered medications on file prior to visit.     Allergies:  Allergies  Allergen Reactions  . Etodolac Other (See Comments)    GI upset  . Macrobid [Nitrofurantoin] Rash   Past Medical History:  Past Medical History:  Diagnosis Date  . High cholesterol   . Hypothyroidism   . Joint pain    Past Surgical History:  Past Surgical History:  Procedure Laterality Date  . APPLICATION OF CRANIAL NAVIGATION Left 12/23/2018   Procedure: APPLICATION OF CRANIAL NAVIGATION;  Surgeon: Consuella Lose, MD;  Location: [facility];  Service: Neurosurgery;  Laterality: Left;  APPLICATION OF CRANIAL NAVIGATION  . BREAST BIOPSY Left   . COLONOSCOPY    . CRANIOTOMY Left 12/23/2018   Procedure: STEREOTACTIC LEFT OCCIPITAL CRANIOTOMY FOR RESECTION OF TUMOR;  Surgeon: Consuella Lose, MD;  Location: [facility];  Service: Neurosurgery;  Laterality: Left;  STEREOTACTIC LEFT OCCIPITAL CRANIOTOMY FOR RESECTION OF TUMOR  . LASIK     Social History:  Social History   Socioeconomic History  . Marital status: Single    Spouse name: Not on file  . Number of children: Not on file  . Years of education: Not on file  . Highest education level: Not on file  Occupational History  . Not on file  Tobacco Use  . Smoking status: Never Smoker  . Smokeless tobacco: Never Used  Vaping Use  . Vaping Use: Never used  Substance and Sexual Activity  . Alcohol use: Not Currently  . Drug use: Never  . Sexual activity: Not on file  Other Topics Concern  . Not on file  Social History Narrative  . Not on file   Social Determinants of Health   Financial Resource Strain:   . Difficulty of Paying Living Expenses: Not on file  Food Insecurity:   . Worried About Charity fundraiser in the Last Year: Not on file  . Ran Out of Food in the Last Year: Not on file  Transportation Needs:   . Lack of Transportation (Medical): Not on file  . Lack of Transportation (Non-Medical): Not on file  Physical Activity:   . Days of Exercise per Week: Not on file  . Minutes of  Exercise per Session: Not on file  Stress:   . Feeling of Stress : Not on file  Social Connections:   . Frequency of Communication with Friends and Family: Not on file  . Frequency of Social Gatherings with Friends and Family: Not on file  . Attends Religious Services: Not on file  . Active Member of Clubs or Organizations: Not on file  . Attends Archivist Meetings: Not on file  . Marital Status: Not on file  Intimate Partner Violence:   . Fear of Current or Ex-Partner: Not on file  . Emotionally Abused: Not on file  . Physically Abused: Not on file  . Sexually Abused: Not on file   Family History: No family history on file.  Review of Systems: Constitutional: Denies fevers, chills or abnormal weight loss Eyes: Denies blurriness of vision  Gastrointestinal:  Denies nausea, constipation, diarrhea GU: Denies dysuria or incontinence Skin: Denies abnormal skin rashes Musculoskeletal: Denies joint pain, back or neck discomfort.  Behavioral/Psych: Denies anxiety, mood instability  Physical Exam: Vitals:   12/05/19 1131  BP: (!) 146/90  Pulse: (!) 120  Resp: 18  Temp: (!) 96.9 F (36.1 C)  SpO2: 100%   KPS: 80. General: Alert, cooperative, pleasant, in no acute distress Head: Craniotomy scar noted, dry and intact. EENT: No conjunctival injection or scleral icterus. Oral mucosa moist Lungs: Resp effort normal Cardiac: Regular rate and rhythm Abdomen: Soft, non-distended abdomen Skin: No rashes cyanosis or petechiae. Extremities: No clubbing or edema  Neurologic Exam: Mental Status: Awake, alert, attentive to examiner. Oriented to self and environment. Language is notable for mild transcortical expressive dyshpasia. Cranial Nerves: Visual acuity is grossly normal. Right homonymous hemianopia. Extra-ocular movements intact. No ptosis. Face is symmetric, tongue midline. Motor: Tone and bulk are normal. Power is full in both arms and legs. Reflexes are symmetric, no  pathologic reflexes present. Intact finger to nose bilaterally Sensory: Intact to light touch and temperature Gait: Normal and tandem gait is deferred.   Labs: I have reviewed the data as listed    Component Value Date/Time   NA 139 11/14/2019 1145   K 4.0 11/14/2019 1145   CL 107 11/14/2019 1145   CO2 23 11/14/2019 1145   GLUCOSE 119 (H) 11/14/2019 1145   BUN 16 11/14/2019 1145   CREATININE 0.93 11/14/2019 1145   CALCIUM 9.8 11/14/2019 1145   PROT 6.4 (L) 11/14/2019 1145   ALBUMIN 3.5 11/14/2019 1145   AST 20 11/14/2019 1145   ALT 8 11/14/2019 1145   ALKPHOS 69 11/14/2019 1145   BILITOT 0.8 11/14/2019 1145   GFRNONAA >60 11/14/2019 1145   GFRAA >60 10/03/2019 1133   Lab Results  Component Value Date   WBC 4.8 12/05/2019   NEUTROABS 3.5 12/05/2019   HGB 11.7 (L) 12/05/2019   HCT 34.7 (L) 12/05/2019   MCV 96.7 12/05/2019   PLT 85 (L) 12/05/2019    Assessment/Plan Glioblastoma with isocitrate dehydrogenase gene wildtype (HCC) [C71.9]   MISTY FOUTZ is clinically stable today.  Labs demonstrate mild thrombocytopenia similar to first cycle; she is clear to dose avastin today.  We recommended continuing second-line treatment with cycle #2 oral CCNU 40m/m2 q6 weeks (day 31/42) with concurrent Avastin IV 160mkg.  Chemotherapy should be held for the following:  ANC less than 1,000  Platelets less than 100,000  LFT or creatinine greater than 2x ULN  If clinical concerns/contraindications develop  Avastin should be held for the following:  ANC less than 500  Platelets less than 50,000  LFT or creatinine greater than 2x ULN  If clinical concerns/contraindications develop  Can remain off dexamethasone.  May discontinue potassium supplementation.  We ask that RoKASHONDA SARKISYANeturn to clinic in 2 weeks for next avastin infusion.  All questions were answered. The patient knows to call the clinic with any problems, questions or concerns. No barriers to learning  were detected.  I have spent a total of 30 minutes of face-to-face and non-face-to-face time, excluding clinical staff time, preparing to see patient, ordering tests and/or medications, counseling the patient, and independently interpreting results and communicating results to the patient/family/caregiver    ZaVentura SellersMD Medical Director of Neuro-Oncology CoNorth[facility] WeW[place]ster1/30/21 11:28 AM

## 2019-12-05 NOTE — Patient Instructions (Signed)
[facility] Discharge Instructions for Patients Receiving Chemotherapy  Today you received the following chemotherapy agents Bevacizumab  To help prevent nausea and vomiting after your treatment, we encourage you to take your nausea medication as directed   If you develop nausea and vomiting that is not controlled by your nausea medication, call the clinic.   BELOW ARE SYMPTOMS THAT SHOULD BE REPORTED IMMEDIATELY:  *FEVER GREATER THAN 100.5 F  *CHILLS WITH OR WITHOUT FEVER  NAUSEA AND VOMITING THAT IS NOT CONTROLLED WITH YOUR NAUSEA MEDICATION  *UNUSUAL SHORTNESS OF BREATH  *UNUSUAL BRUISING OR BLEEDING  TENDERNESS IN MOUTH AND THROAT WITH OR WITHOUT PRESENCE OF ULCERS  *URINARY PROBLEMS  *BOWEL PROBLEMS  UNUSUAL RASH Items with * indicate a potential emergency and should be followed up as soon as possible.  Feel free to call the clinic should you have any questions or concerns. The clinic phone number is [phone].  Please show the CHEMO ALERT CARD at check-in to the Emergency Department and triage nurse.   

## 2019-12-19 ENCOUNTER — Other Ambulatory Visit: Payer: Self-pay

## 2019-12-19 ENCOUNTER — Inpatient Hospital Stay: Payer: BC Managed Care – PPO | Admitting: Internal Medicine

## 2019-12-19 ENCOUNTER — Inpatient Hospital Stay: Payer: BC Managed Care – PPO

## 2019-12-19 ENCOUNTER — Inpatient Hospital Stay: Payer: BC Managed Care – PPO | Attending: Internal Medicine

## 2019-12-19 VITALS — BP 132/72 | Resp 18

## 2019-12-19 VITALS — BP 144/74 | HR 96 | Temp 97.7°F | Resp 16 | Ht 62.0 in | Wt 112.7 lb

## 2019-12-19 DIAGNOSIS — Z5112 Encounter for antineoplastic immunotherapy: Secondary | ICD-10-CM | POA: Insufficient documentation

## 2019-12-19 DIAGNOSIS — Z79899 Other long term (current) drug therapy: Secondary | ICD-10-CM | POA: Insufficient documentation

## 2019-12-19 DIAGNOSIS — C719 Malignant neoplasm of brain, unspecified: Secondary | ICD-10-CM

## 2019-12-19 DIAGNOSIS — E7800 Pure hypercholesterolemia, unspecified: Secondary | ICD-10-CM | POA: Insufficient documentation

## 2019-12-19 DIAGNOSIS — Z7189 Other specified counseling: Secondary | ICD-10-CM | POA: Diagnosis not present

## 2019-12-19 DIAGNOSIS — C714 Malignant neoplasm of occipital lobe: Secondary | ICD-10-CM | POA: Insufficient documentation

## 2019-12-19 DIAGNOSIS — E039 Hypothyroidism, unspecified: Secondary | ICD-10-CM | POA: Insufficient documentation

## 2019-12-19 LAB — CBC WITH DIFFERENTIAL (CANCER CENTER ONLY)
Abs Immature Granulocytes: 0 10*3/uL (ref 0.00–0.07)
Basophils Absolute: 0 10*3/uL (ref 0.0–0.1)
Basophils Relative: 1 %
Eosinophils Absolute: 0 10*3/uL (ref 0.0–0.5)
Eosinophils Relative: 2 %
HCT: 35.1 % — ABNORMAL LOW (ref 36.0–46.0)
Hemoglobin: 11.7 g/dL — ABNORMAL LOW (ref 12.0–15.0)
Immature Granulocytes: 0 %
Lymphocytes Relative: 40 %
Lymphs Abs: 0.8 10*3/uL (ref 0.7–4.0)
MCH: 33 pg (ref 26.0–34.0)
MCHC: 33.3 g/dL (ref 30.0–36.0)
MCV: 98.9 fL (ref 80.0–100.0)
Monocytes Absolute: 0.4 10*3/uL (ref 0.1–1.0)
Monocytes Relative: 19 %
Neutro Abs: 0.7 10*3/uL — ABNORMAL LOW (ref 1.7–7.7)
Neutrophils Relative %: 38 %
Platelet Count: 200 10*3/uL (ref 150–400)
RBC: 3.55 MIL/uL — ABNORMAL LOW (ref 3.87–5.11)
RDW: 16.7 % — ABNORMAL HIGH (ref 11.5–15.5)
WBC Count: 1.9 10*3/uL — ABNORMAL LOW (ref 4.0–10.5)
nRBC: 0 % (ref 0.0–0.2)

## 2019-12-19 LAB — TOTAL PROTEIN, URINE DIPSTICK: Protein, ur: NEGATIVE mg/dL

## 2019-12-19 LAB — CMP (CANCER CENTER ONLY)
ALT: 19 U/L (ref 0–44)
AST: 23 U/L (ref 15–41)
Albumin: 3.4 g/dL — ABNORMAL LOW (ref 3.5–5.0)
Alkaline Phosphatase: 81 U/L (ref 38–126)
Anion gap: 10 (ref 5–15)
BUN: 20 mg/dL (ref 8–23)
CO2: 22 mmol/L (ref 22–32)
Calcium: 9.5 mg/dL (ref 8.9–10.3)
Chloride: 108 mmol/L (ref 98–111)
Creatinine: 0.82 mg/dL (ref 0.44–1.00)
GFR, Estimated: >60 mL/min (ref >60)
Glucose, Bld: 95 mg/dL (ref 70–99)
Potassium: 3.7 mmol/L (ref 3.5–5.1)
Sodium: 140 mmol/L (ref 135–145)
Total Bilirubin: 0.6 mg/dL (ref 0.3–1.2)
Total Protein: 6.5 g/dL (ref 6.5–8.1)

## 2019-12-19 MED ORDER — SODIUM CHLORIDE 0.9 % IV SOLN
Freq: Once | INTRAVENOUS | Status: AC
  Filled 2019-12-19: qty 250

## 2019-12-19 MED ORDER — SODIUM CHLORIDE 0.9 % IV SOLN
10.0000 mg/kg | Freq: Once | INTRAVENOUS | Status: AC
  Administered 2019-12-19: 500 mg via INTRAVENOUS
  Filled 2019-12-19: qty 16

## 2019-12-19 NOTE — Progress Notes (Signed)
Pt discharged in stable condition.

## 2019-12-19 NOTE — Progress Notes (Signed)
[facility] [place], [place] [address] [phone]   Interval Evaluation  Date of Service: 12/19/19 Patient Name: Jillian Gomez Patient MRN: [id number] Patient DOB: 01-11-1955 Provider: Ventura Sellers, MD  Identifying Statement:  Jillian Gomez is a 64 y.o. female with left occipital glioblastoma   Oncologic History: Oncology History  Glioblastoma with isocitrate dehydrogenase gene wildtype ([place])  12/23/2018 Surgery   Craniotomy, left occipital resection by Dr. Kathyrn Sheriff.  Path is GBM IDH-wt   01/23/2019 - 03/03/2019 Radiation Therapy   IMRT with concurrent Temozolomide 51m/m2   03/28/2019 - 03/28/2019 Chemotherapy   The patient had temozolomide (TEMODAR) 20 MG capsule, 20 mg (100 % of original dose 20 mg), Oral, Daily, 1 of 1 cycle, Start date: 03/28/2019, End date: 05/30/2019 Dose modification: 20 mg (original dose 20 mg, Cycle 1) temozolomide (TEMODAR) 100 MG capsule, 300 mg, Oral, Daily, 1 of 1 cycle, Start date: 08/01/2019, End date: 09/04/2019 Dose modification: 200 mg (original dose 200 mg, Cycle 1), 300 mg (original dose 200 mg, Cycle 1)  for chemotherapy treatment.    09/04/2019 -  Chemotherapy   The patient had dexamethasone (DECADRON) 4 MG tablet, 8 mg, Oral, Daily, 1 of 1 cycle, Start date: --, End date: -- lomustine (CEENU) 100 MG capsule, 100 mg (100 % of original dose 100 mg), Oral,  Once, 1 of 1 cycle, Start date: 09/04/2019, End date: 09/04/2019 Dose modification: 100 mg (original dose 100 mg, Cycle 1) lomustine (CEENU) 40 MG capsule, 40 mg (100 % of original dose 40 mg), Oral,  Once, 1 of 1 cycle, Start date: 09/04/2019, End date: 09/04/2019 Dose modification: 40 mg (original dose 40 mg, Cycle 1)  for chemotherapy treatment.    09/19/2019 -  Chemotherapy   The patient had bevacizumab-bvzr (ZIRABEV) 600 mg in sodium chloride 0.9 % 100 mL chemo infusion, 10 mg/kg = 600 mg, Intravenous,  Once, 6 of 6  cycles Administration: 600 mg (09/19/2019), 600 mg (10/17/2019), 600 mg (10/31/2019), 500 mg (12/05/2019), 600 mg (10/03/2019), 600 mg (11/14/2019)  for chemotherapy treatment.      Biomarkers:  MGMT Unknown.  IDH 1/2 Wild type.  EGFR Unknown  TERT Unknown   Interval History:  Jillian WIGINTONpresents today for avastin infusion. No new or progressive neurologic complaints today.  Does complain of poor appetite and upset stomach at time.  Continues to have deficits with reading and right sided vision.  No right sided weakness. No seizures or headaches.  H+P (01/09/19) Patient presented to medical attention in early December with ~2 months history of right sided visual impairment.  She describes fuzzy or blurry vision on that side which was progressive over time, leading to at least one fall.  MRI brain demonstrated enhancing left occipital mass; this was subsequently resected by Dr. NKathyrn Sheriffon 12/23/18.  She had no issues with surgery and has completed her steroid taper.  Continues on keppra daily but no history of any seizure. She has returned to work with only modest difficutly.  No issues walking or performing ADLs; lives alone but her sister and other family members are nearby.  Medications: Current Outpatient Medications on File Prior to Visit  Medication Sig Dispense Refill  . atorvastatin (LIPITOR) 40 MG tablet Take 20 mg by mouth daily.    . Biotin 1 MG CAPS Take 1 mg by mouth daily.    .Marland KitchenCALCIUM PO Take 1 tablet by mouth daily.    .Marland Kitchen  EUTHYROX 50 MCG tablet Take 50 mcg by mouth daily.    . naproxen sodium (ALEVE) 220 MG tablet Take 1 tablet (220 mg total) by mouth daily as needed (pain).    . ondansetron (ZOFRAN) 8 MG tablet Take 1 tablet (8 mg total) by mouth 2 (two) times daily as needed. Start on the third day after chemotherapy. 30 tablet 1  . potassium chloride SA (KLOR-CON) 10 MEQ tablet Take 1 tablet (10 mEq total) by mouth daily. 30 tablet 3   No current facility-administered  medications on file prior to visit.    Allergies:  Allergies  Allergen Reactions  . Etodolac Other (See Comments)    GI upset  . Macrobid [Nitrofurantoin] Rash   Past Medical History:  Past Medical History:  Diagnosis Date  . High cholesterol   . Hypothyroidism   . Joint pain    Past Surgical History:  Past Surgical History:  Procedure Laterality Date  . APPLICATION OF CRANIAL NAVIGATION Left 12/23/2018   Procedure: APPLICATION OF CRANIAL NAVIGATION;  Surgeon: Consuella Lose, MD;  Location: [facility];  Service: Neurosurgery;  Laterality: Left;  APPLICATION OF CRANIAL NAVIGATION  . BREAST BIOPSY Left   . COLONOSCOPY    . CRANIOTOMY Left 12/23/2018   Procedure: STEREOTACTIC LEFT OCCIPITAL CRANIOTOMY FOR RESECTION OF TUMOR;  Surgeon: Consuella Lose, MD;  Location: [facility];  Service: Neurosurgery;  Laterality: Left;  STEREOTACTIC LEFT OCCIPITAL CRANIOTOMY FOR RESECTION OF TUMOR  . LASIK     Social History:  Social History   Socioeconomic History  . Marital status: Single    Spouse name: Not on file  . Number of children: Not on file  . Years of education: Not on file  . Highest education level: Not on file  Occupational History  . Not on file  Tobacco Use  . Smoking status: Never Smoker  . Smokeless tobacco: Never Used  Vaping Use  . Vaping Use: Never used  Substance and Sexual Activity  . Alcohol use: Not Currently  . Drug use: Never  . Sexual activity: Not on file  Other Topics Concern  . Not on file  Social History Narrative  . Not on file   Social Determinants of Health   Financial Resource Strain: Not on file  Food Insecurity: Not on file  Transportation Needs: Not on file  Physical Activity: Not on file  Stress: Not on file  Social Connections: Not on file  Intimate Partner Violence: Not on file   Family History: No family history on file.  Review of Systems: Constitutional: Denies fevers, chills or abnormal weight loss Eyes: Denies blurriness of  vision Gastrointestinal:  Denies nausea, constipation, diarrhea GU: Denies dysuria or incontinence Skin: Denies abnormal skin rashes Musculoskeletal: Denies joint pain, back or neck discomfort.  Behavioral/Psych: Denies anxiety, mood instability  Physical Exam: Vitals:   12/19/19 1229  BP: (!) 144/74  Pulse: 96  Resp: 16  Temp: 97.7 F (36.5 C)  SpO2: 100%   KPS: 80. General: Alert, cooperative, pleasant, in no acute distress Head: Craniotomy scar noted, dry and intact. EENT: No conjunctival injection or scleral icterus. Oral mucosa moist Lungs: Resp effort normal Cardiac: Regular rate and rhythm Abdomen: Soft, non-distended abdomen Skin: No rashes cyanosis or petechiae. Extremities: No clubbing or edema  Neurologic Exam: Mental Status: Awake, alert, attentive to examiner. Oriented to self and environment. Language is notable for mild transcortical expressive dyshpasia. Cranial Nerves: Visual acuity is grossly normal. Right homonymous hemianopia. Extra-ocular movements intact. No ptosis. Face  is symmetric, tongue midline. Motor: Tone and bulk are normal. Power is full in both arms and legs. Reflexes are symmetric, no pathologic reflexes present. Intact finger to nose bilaterally Sensory: Intact to light touch and temperature Gait: Normal and tandem gait is deferred.   Labs: I have reviewed the data as listed    Component Value Date/Time   NA 140 12/05/2019 1114   K 3.9 12/05/2019 1114   CL 110 12/05/2019 1114   CO2 19 (L) 12/05/2019 1114   GLUCOSE 104 (H) 12/05/2019 1114   BUN 25 (H) 12/05/2019 1114   CREATININE 0.93 12/05/2019 1114   CALCIUM 10.0 12/05/2019 1114   PROT 6.8 12/05/2019 1114   ALBUMIN 3.6 12/05/2019 1114   AST 26 12/05/2019 1114   ALT 16 12/05/2019 1114   ALKPHOS 76 12/05/2019 1114   BILITOT 0.9 12/05/2019 1114   GFRNONAA >60 12/05/2019 1114   GFRAA >60 10/03/2019 1133   Lab Results  Component Value Date   WBC 1.9 (L) 12/19/2019   NEUTROABS 0.7  (L) 12/19/2019   HGB 11.7 (L) 12/19/2019   HCT 35.1 (L) 12/19/2019   MCV 98.9 12/19/2019   PLT 200 12/19/2019    Assessment/Plan Glioblastoma with isocitrate dehydrogenase gene wildtype ([facility]) [C71.9]   Jillian Gomez is clinically stable today.  Labs demonstrate neutropenia, but thromboctyopenia has resolved.  We recommended continuing with concurrent Avastin IV 30m/kg, but holding off on cycle #3 of CCNU due to cytopenias.  Chemotherapy should be held for the following:  ANC less than 1,000  Platelets less than 100,000  LFT or creatinine greater than 2x ULN  If clinical concerns/contraindications develop  Avastin should be held for the following:  ANC less than 500  Platelets less than 50,000  LFT or creatinine greater than 2x ULN  If clinical concerns/contraindications develop  Neutropenic precautions were reviewed.  We ask that RSANGEETA YOUSEreturn to clinic in 2 weeks for next avastin infusion, consideration of further cytotoxic chemotherapy. We will schedule MRI brain prior to next visit.  All questions were answered. The patient knows to call the clinic with any problems, questions or concerns. No barriers to learning were detected.  I have spent a total of 30 minutes of face-to-face and non-face-to-face time, excluding clinical staff time, preparing to see patient, ordering tests and/or medications, counseling the patient, and independently interpreting results and communicating results to the patient/family/caregiver    ZVentura Sellers MD Medical Director of Neuro-Oncology [facility] [place]12/14/21 12:36 PM

## 2019-12-19 NOTE — Patient Instructions (Signed)
Cancer Center Discharge Instructions for Patients Receiving Chemotherapy  Today you received the following chemotherapy agents: bevacizumab  To help prevent nausea and vomiting after your treatment, we encourage you to take your nausea medication as directed.   If you develop nausea and vomiting that is not controlled by your nausea medication, call the clinic.   BELOW ARE SYMPTOMS THAT SHOULD BE REPORTED IMMEDIATELY:  *FEVER GREATER THAN 100.5 F  *CHILLS WITH OR WITHOUT FEVER  NAUSEA AND VOMITING THAT IS NOT CONTROLLED WITH YOUR NAUSEA MEDICATION  *UNUSUAL SHORTNESS OF BREATH  *UNUSUAL BRUISING OR BLEEDING  TENDERNESS IN MOUTH AND THROAT WITH OR WITHOUT PRESENCE OF ULCERS  *URINARY PROBLEMS  *BOWEL PROBLEMS  UNUSUAL RASH Items with * indicate a potential emergency and should be followed up as soon as possible.  Feel free to call the clinic should you have any questions or concerns. The clinic phone number is [phone].  Please show the CHEMO ALERT CARD at check-in to the Emergency Department and triage nurse.   

## 2019-12-21 ENCOUNTER — Telehealth: Payer: Self-pay | Admitting: Internal Medicine

## 2019-12-21 ENCOUNTER — Ambulatory Visit: Payer: BC Managed Care – PPO | Admitting: Internal Medicine

## 2019-12-21 NOTE — Telephone Encounter (Signed)
Scheduled appt per 12/15 sch msg - pt is aware of appt date and time   

## 2019-12-28 ENCOUNTER — Other Ambulatory Visit: Payer: Self-pay | Admitting: Internal Medicine

## 2020-01-01 ENCOUNTER — Other Ambulatory Visit: Payer: Self-pay

## 2020-01-01 ENCOUNTER — Ambulatory Visit (HOSPITAL_COMMUNITY)
Admission: RE | Admit: 2020-01-01 | Discharge: 2020-01-01 | Disposition: A | Discharge status: Home / Self Care | Payer: BC Managed Care – PPO | Source: Ambulatory Visit | Attending: Internal Medicine | Admitting: Internal Medicine

## 2020-01-01 DIAGNOSIS — C719 Malignant neoplasm of brain, unspecified: Secondary | ICD-10-CM | POA: Diagnosis not present

## 2020-01-01 IMAGING — MR MR HEAD WO/W CM
17 series · 48 of 48 positions shown · IV contrast (gadavist)
Comparison: MRI [DATE].

CLINICAL DATA: Brain/CNS neoplasm, assess treatment response.

EXAM:
MRI HEAD WITHOUT AND WITH CONTRAST
TECHNIQUE: Multiplanar, multiecho pulse sequences of the brain and surrounding
structures were obtained without and with intravenous contrast.
CONTRAST:  5mL GADAVIST GADOBUTROL 1 MMOL/ML IV SOLN

[Series 9: DWI · axial · 3.0mm · 1.25mm/px · z∈[-44,+113]mm · 3 of 108 slices shown (1 of 4)]
[im 1/108]
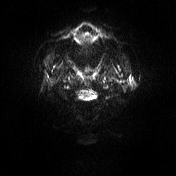
[im 54/108]
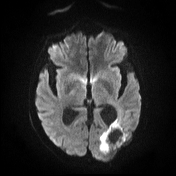
[im 108/108]
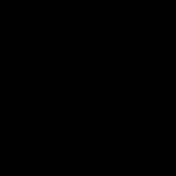

[Series 10: DWI · axial · 3.0mm · 1.25mm/px · z∈[-44,+110]mm · 2 of 52 slices shown (2 of 4)]
[im 1/52]
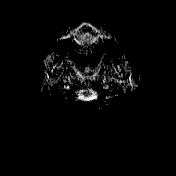
[im 52/52]
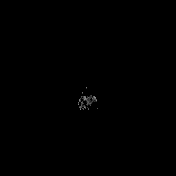

[Series 11: mip_images(sw) · axial · 24.0mm · 0.66mm/px · z∈[-36,+106]mm · 2 of 49 slices shown]
[im 1/49]
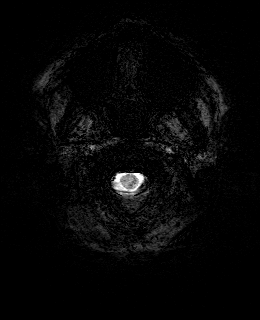
[im 49/49]
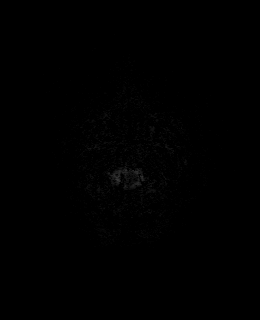

[Series 12: swi_images · axial · 3.0mm · 0.66mm/px · z∈[-47,+116]mm · 2 of 56 slices shown]
[im 1/56]
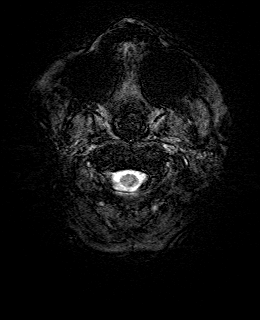
[im 56/56]
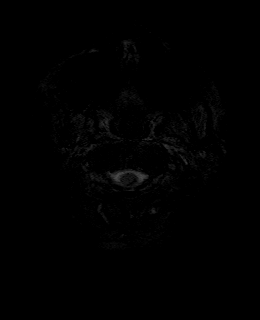

[Series 13: FLAIR · axial · 3.0mm · 0.66mm/px · z∈[-42,+115]mm · 2 of 54 slices shown (1 of 2)]
[im 1/54]
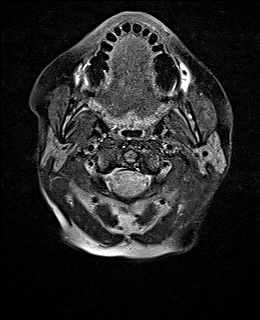
[im 54/54]
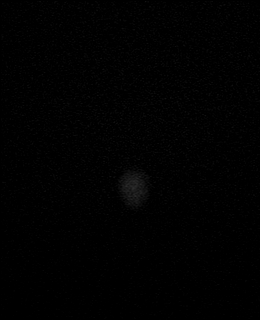

[Series 14: DWI · coronal · 5.0mm · 1.31mm/px · 3 of 72 slices shown (3 of 4)]
[im 1/72]
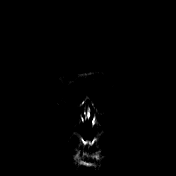
[im 36/72]
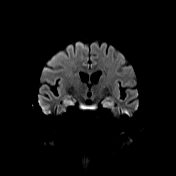
[im 72/72]
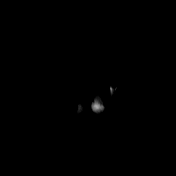

[Series 15: DWI · coronal · 5.0mm · 1.31mm/px · 1 of 36 slices shown (4 of 4)]
[im 1/36]
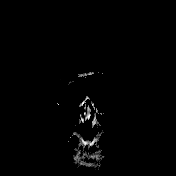

[Series 16: T1 · sagittal · 5.0mm · 0.72mm/px · 1 of 25 slices shown (1 of 2)]
[im 1/25]
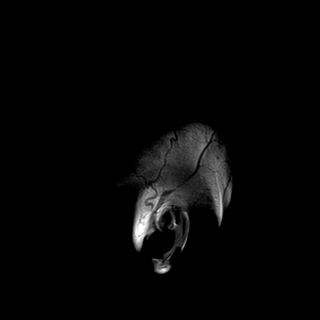

[Series 17: T2 · axial · 5.0mm · 0.55mm/px · 1 of 26 slices shown (1 of 2)]
[im 1/26]
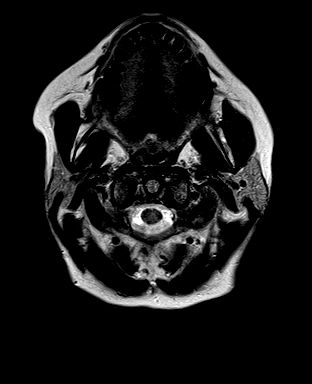

[Series 18: T1 · axial · 1.0mm · 0.82mm/px · z∈[-51,+122]mm · 6 of 176 slices shown (2 of 2)]
[im 1/176]
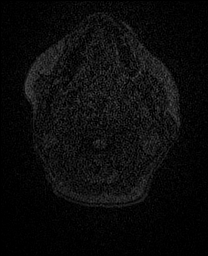
[im 36/176]
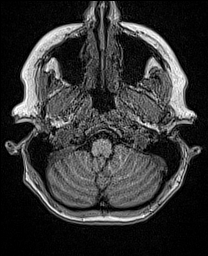
[im 71/176]
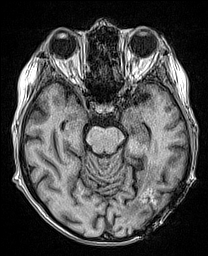
[im 106/176]
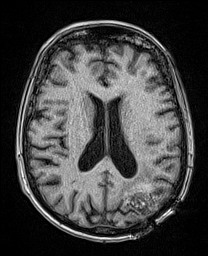
[im 141/176]
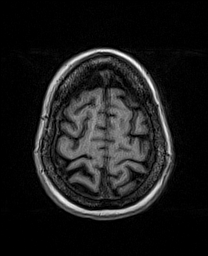
[im 176/176]
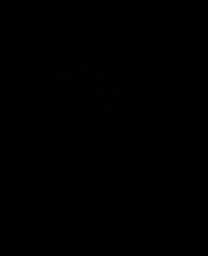

[Series 19: T2 · coronal · 5.0mm · 0.57mm/px · 1 of 28 slices shown (2 of 2)]
[im 1/28]
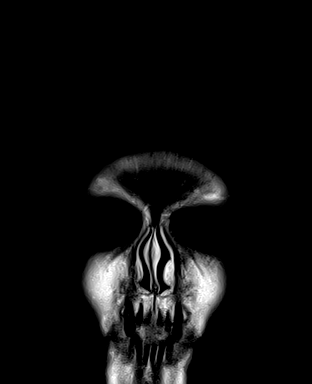

[Series 20: FLAIR · axial · 4.0mm · 0.82mm/px · z∈[-43,+115]mm · 2 of 41 slices shown (2 of 2)]
[im 1/41]
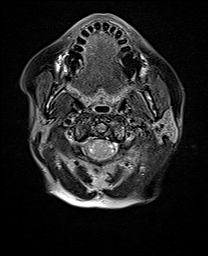
[im 41/41]
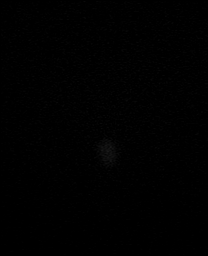

[Series 21: T1 post-contrast · axial · 1.0mm · 0.82mm/px · z∈[-51,+122]mm · 6 of 176 slices shown (1 of 3)]
[im 1/176]
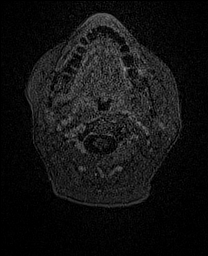
[im 36/176]
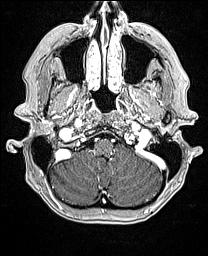
[im 71/176]
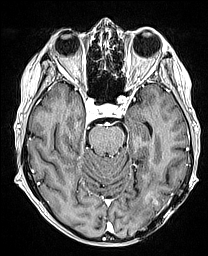
[im 106/176]
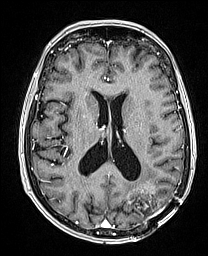
[im 141/176]
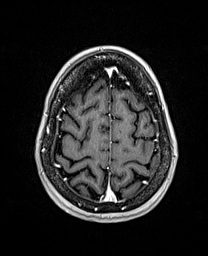
[im 176/176]
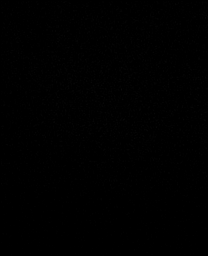

[Series 22: T1 post-contrast · coronal · 5.0mm · 0.43mm/px · 1 of 28 slices shown (2 of 3)]
[im 1/28]
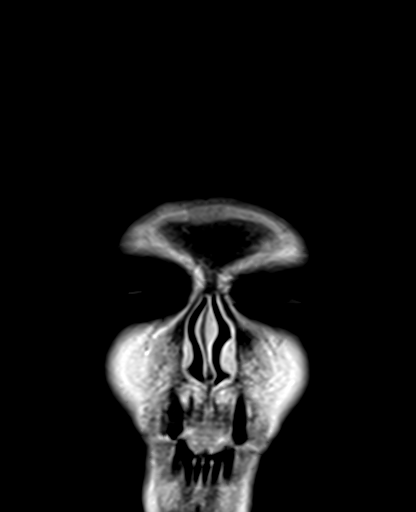

[Series 23: T1 post-contrast · sagittal · 5.0mm · 0.72mm/px · 1 of 25 slices shown (3 of 3)]
[im 1/25]
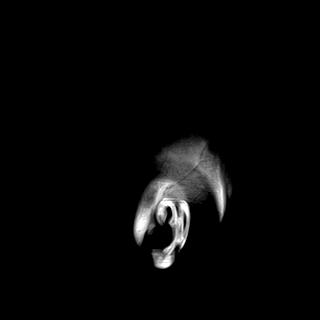

[Series 100: <mpr range> · axial · 1.0mm · 0.43mm/px · z∈[+35,+177]mm · 7 of 177 slices shown]
[im 1/177]
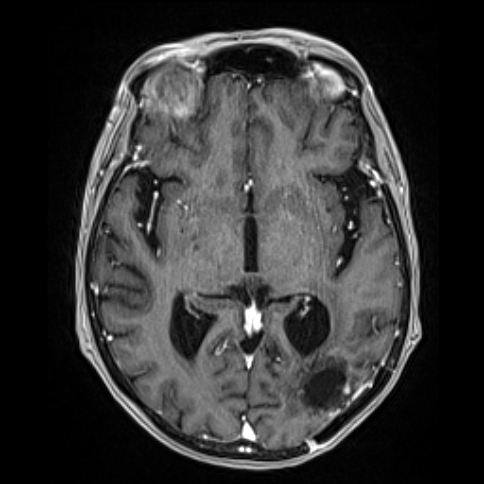
[im 30/177]
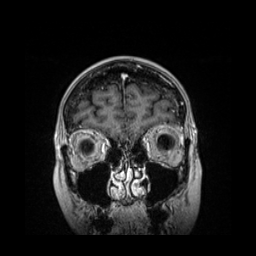
[im 59/177]
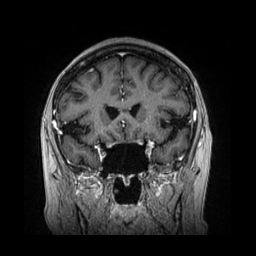
[im 89/177]
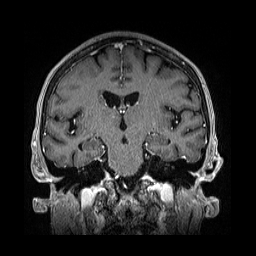
[im 118/177]
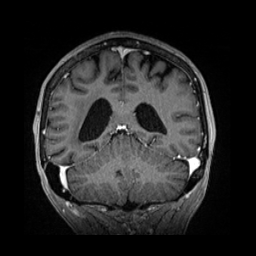
[im 147/177]
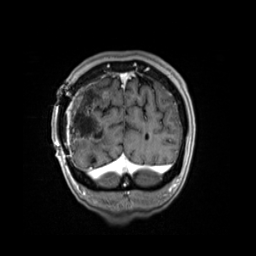
[im 177/177]
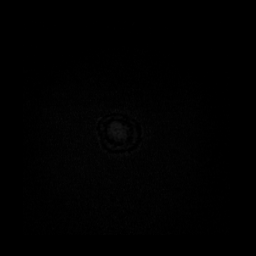

[Series 101: <mpr range(1)> · axial · 1.0mm · 0.43mm/px · z∈[+35,+141]mm · 7 of 177 slices shown]
[im 1/177]
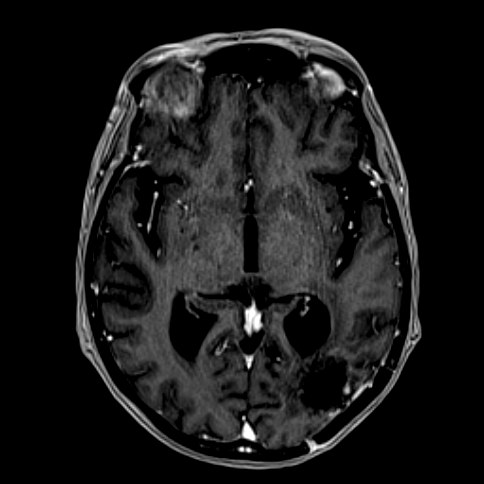
[im 30/177]
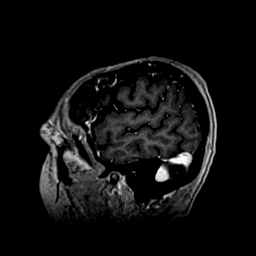
[im 59/177]
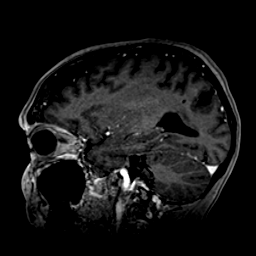
[im 89/177]
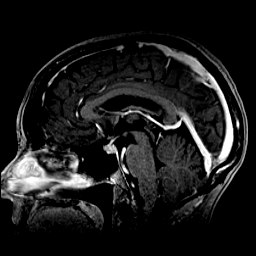
[im 118/177]
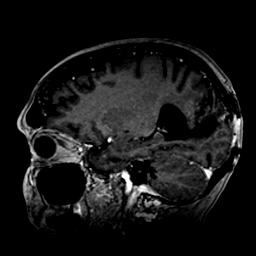
[im 147/177]
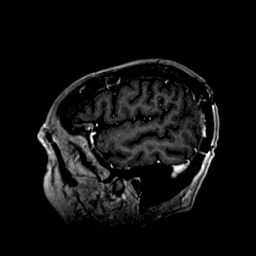
[im 177/177]
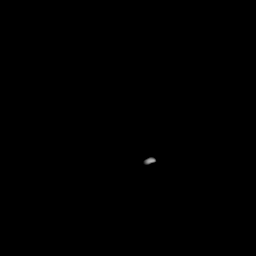

[48 of 48 positions shown; findings below may reference images not displayed]

FINDINGS: Brain: Slight increase in bulk/conspicuity restricted diffusion
along the posteromedial aspect of the left parieto-occipital mass
(series 9, image 81). Otherwise, no substantial change in the
appearance of the mass with heterogeneous enhancement peripherally
and similar surrounding T2/FLAIR hyperintensity. Similar ex stent of
T2/FLAIR hyperintensity into the left aspect of the corpus callosum
without crossing midline. Similar postsurgical changes of prior
debulking with smooth overlying dural enhancement. No evidence of
acute infarct. No hydrocephalus. No acute hemorrhage. Similar remote
blood products associated with the resection site. No midline shift.

Vascular: Major arterial flow voids are maintained at the skull
base.

Skull and upper cervical spine: Left parieto-occipital craniotomy.

Sinuses/Orbits: Sinuses are clear.  Unremarkable orbits.

Other: No mastoid effusions.
IMPRESSION: Slight increase in bulk/conspicuity of restricted diffusion along
the posteromedial aspect of the left parieto-occipital mass, which
could represent slight progression versus treatment response.
Otherwise, similar appearance of the mass and surrounding T2/FLAIR
hyperintensity, as described above.

## 2020-01-01 MED ORDER — GADOBUTROL 1 MMOL/ML IV SOLN
5.0000 mL | Freq: Once | INTRAVENOUS | Status: AC | PRN
  Administered 2020-01-01: 5 mL via INTRAVENOUS

## 2020-01-04 ENCOUNTER — Other Ambulatory Visit: Payer: BC Managed Care – PPO

## 2020-01-09 ENCOUNTER — Other Ambulatory Visit: Payer: Self-pay

## 2020-01-09 ENCOUNTER — Inpatient Hospital Stay: Payer: BC Managed Care – PPO | Attending: Internal Medicine

## 2020-01-09 ENCOUNTER — Inpatient Hospital Stay (HOSPITAL_BASED_OUTPATIENT_CLINIC_OR_DEPARTMENT_OTHER): Payer: BC Managed Care – PPO | Admitting: Internal Medicine

## 2020-01-09 ENCOUNTER — Other Ambulatory Visit: Payer: Self-pay | Admitting: Internal Medicine

## 2020-01-09 ENCOUNTER — Inpatient Hospital Stay: Payer: BC Managed Care – PPO

## 2020-01-09 VITALS — BP 140/89 | HR 106 | Temp 97.8°F | Resp 15 | Ht 62.0 in | Wt 107.8 lb

## 2020-01-09 VITALS — HR 96

## 2020-01-09 DIAGNOSIS — C714 Malignant neoplasm of occipital lobe: Secondary | ICD-10-CM | POA: Diagnosis not present

## 2020-01-09 DIAGNOSIS — C719 Malignant neoplasm of brain, unspecified: Secondary | ICD-10-CM

## 2020-01-09 DIAGNOSIS — Z79899 Other long term (current) drug therapy: Secondary | ICD-10-CM | POA: Diagnosis not present

## 2020-01-09 DIAGNOSIS — E039 Hypothyroidism, unspecified: Secondary | ICD-10-CM | POA: Insufficient documentation

## 2020-01-09 DIAGNOSIS — Z923 Personal history of irradiation: Secondary | ICD-10-CM | POA: Insufficient documentation

## 2020-01-09 DIAGNOSIS — Z5112 Encounter for antineoplastic immunotherapy: Secondary | ICD-10-CM | POA: Diagnosis not present

## 2020-01-09 DIAGNOSIS — Z9221 Personal history of antineoplastic chemotherapy: Secondary | ICD-10-CM | POA: Diagnosis not present

## 2020-01-09 DIAGNOSIS — E7800 Pure hypercholesterolemia, unspecified: Secondary | ICD-10-CM | POA: Diagnosis not present

## 2020-01-09 LAB — CBC WITH DIFFERENTIAL (CANCER CENTER ONLY)
Abs Immature Granulocytes: 0.01 10*3/uL (ref 0.00–0.07)
Basophils Absolute: 0.1 10*3/uL (ref 0.0–0.1)
Basophils Relative: 1 %
Eosinophils Absolute: 0.4 10*3/uL (ref 0.0–0.5)
Eosinophils Relative: 7 %
HCT: 42.3 % (ref 36.0–46.0)
Hemoglobin: 14.1 g/dL (ref 12.0–15.0)
Immature Granulocytes: 0 %
Lymphocytes Relative: 21 %
Lymphs Abs: 1.1 10*3/uL (ref 0.7–4.0)
MCH: 32.7 pg (ref 26.0–34.0)
MCHC: 33.3 g/dL (ref 30.0–36.0)
MCV: 98.1 fL (ref 80.0–100.0)
Monocytes Absolute: 0.5 10*3/uL (ref 0.1–1.0)
Monocytes Relative: 10 %
Neutro Abs: 3.2 10*3/uL (ref 1.7–7.7)
Neutrophils Relative %: 61 %
Platelet Count: 270 10*3/uL (ref 150–400)
RBC: 4.31 MIL/uL (ref 3.87–5.11)
RDW: 14.5 % (ref 11.5–15.5)
WBC Count: 5.3 10*3/uL (ref 4.0–10.5)
nRBC: 0 % (ref 0.0–0.2)

## 2020-01-09 LAB — CMP (CANCER CENTER ONLY)
ALT: 16 U/L (ref 0–44)
AST: 19 U/L (ref 15–41)
Albumin: 3.8 g/dL (ref 3.5–5.0)
Alkaline Phosphatase: 77 U/L (ref 38–126)
Anion gap: 9 (ref 5–15)
BUN: 13 mg/dL (ref 8–23)
CO2: 23 mmol/L (ref 22–32)
Calcium: 9.9 mg/dL (ref 8.9–10.3)
Chloride: 106 mmol/L (ref 98–111)
Creatinine: 1.02 mg/dL — ABNORMAL HIGH (ref 0.44–1.00)
GFR, Estimated: >60 mL/min (ref >60)
Glucose, Bld: 101 mg/dL — ABNORMAL HIGH (ref 70–99)
Potassium: 4 mmol/L (ref 3.5–5.1)
Sodium: 138 mmol/L (ref 135–145)
Total Bilirubin: 0.5 mg/dL (ref 0.3–1.2)
Total Protein: 7.1 g/dL (ref 6.5–8.1)

## 2020-01-09 LAB — TOTAL PROTEIN, URINE DIPSTICK

## 2020-01-09 MED ORDER — SODIUM CHLORIDE 0.9 % IV SOLN
500.0000 mg | Freq: Once | INTRAVENOUS | Status: AC
  Administered 2020-01-09: 500 mg via INTRAVENOUS
  Filled 2020-01-09: qty 16

## 2020-01-09 MED ORDER — LOMUSTINE 40 MG PO CAPS: 40.0000 mg | ORAL_CAPSULE | Freq: Once | ORAL | 0 refills | Status: DC

## 2020-01-09 MED ORDER — OMEPRAZOLE 20 MG PO CPDR: 20.0000 mg | DELAYED_RELEASE_CAPSULE | Freq: Every day | ORAL | 3 refills | Status: AC

## 2020-01-09 MED ORDER — SODIUM CHLORIDE 0.9 % IV SOLN
Freq: Once | INTRAVENOUS | Status: AC
  Filled 2020-01-09: qty 250

## 2020-01-09 MED ORDER — LOMUSTINE 100 MG PO CAPS: 100.0000 mg | ORAL_CAPSULE | Freq: Once | ORAL | 0 refills | Status: DC

## 2020-01-09 NOTE — Patient Instructions (Signed)
Cancer Center Discharge Instructions for Patients Receiving Chemotherapy  Today you received the following chemotherapy agents: Bevacizumab (Avastin).  To help prevent nausea and vomiting after your treatment, we encourage you to take your nausea medication as prescribed.  If you develop nausea and vomiting that is not controlled by your nausea medication, call the clinic.   BELOW ARE SYMPTOMS THAT SHOULD BE REPORTED IMMEDIATELY:  *FEVER GREATER THAN 100.5 F  *CHILLS WITH OR WITHOUT FEVER  NAUSEA AND VOMITING THAT IS NOT CONTROLLED WITH YOUR NAUSEA MEDICATION  *UNUSUAL SHORTNESS OF BREATH  *UNUSUAL BRUISING OR BLEEDING  TENDERNESS IN MOUTH AND THROAT WITH OR WITHOUT PRESENCE OF ULCERS  *URINARY PROBLEMS  *BOWEL PROBLEMS  UNUSUAL RASH Items with * indicate a potential emergency and should be followed up as soon as possible.  Feel free to call the clinic should you have any questions or concerns. The clinic phone number is [phone].  Please show the CHEMO ALERT CARD at check-in to the Emergency Department and triage nurse.   

## 2020-01-09 NOTE — Progress Notes (Signed)
[facility] [place], [place] [address] [phone]   Interval Evaluation  Date of Service: 01/09/20 Patient Name: Jillian Gomez Patient MRN: [id number] Patient DOB: 01-Jul-1955 Provider: Ventura Sellers, MD  Identifying Statement:  Jillian Gomez is a 65 y.o. female with left occipital glioblastoma   Oncologic History: Oncology History  Glioblastoma with isocitrate dehydrogenase gene wildtype ([place])  12/23/2018 Surgery   Craniotomy, left occipital resection by Dr. Kathyrn Sheriff.  Path is GBM IDH-wt   01/23/2019 - 03/03/2019 Radiation Therapy   IMRT with concurrent Temozolomide 41m/m2   03/28/2019 - 03/28/2019 Chemotherapy   The patient had temozolomide (TEMODAR) 20 MG capsule, 20 mg (100 % of original dose 20 mg), Oral, Daily, 1 of 1 cycle, Start date: 03/28/2019, End date: 05/30/2019 Dose modification: 20 mg (original dose 20 mg, Cycle 1) temozolomide (TEMODAR) 100 MG capsule, 300 mg, Oral, Daily, 1 of 1 cycle, Start date: 08/01/2019, End date: 09/04/2019 Dose modification: 200 mg (original dose 200 mg, Cycle 1), 300 mg (original dose 200 mg, Cycle 1)  for chemotherapy treatment.    09/04/2019 -  Chemotherapy   The patient had dexamethasone (DECADRON) 4 MG tablet, 8 mg, Oral, Daily, 1 of 1 cycle, Start date: --, End date: -- lomustine (CEENU) 100 MG capsule, 100 mg (100 % of original dose 100 mg), Oral,  Once, 1 of 1 cycle, Start date: 09/04/2019, End date: 09/04/2019 Dose modification: 100 mg (original dose 100 mg, Cycle 1) lomustine (CEENU) 40 MG capsule, 40 mg (100 % of original dose 40 mg), Oral,  Once, 1 of 1 cycle, Start date: 09/04/2019, End date: 09/04/2019 Dose modification: 40 mg (original dose 40 mg, Cycle 1)  for chemotherapy treatment.    09/19/2019 -  Chemotherapy   The patient had bevacizumab-bvzr (ZIRABEV) 600 mg in sodium chloride 0.9 % 100 mL chemo infusion, 10 mg/kg = 600 mg, Intravenous,  Once, 7 of 8  cycles Administration: 600 mg (09/19/2019), 600 mg (10/17/2019), 600 mg (10/31/2019), 500 mg (12/05/2019), 600 mg (10/03/2019), 600 mg (11/14/2019), 500 mg (12/19/2019)  for chemotherapy treatment.      Biomarkers:  MGMT Unknown.  IDH 1/2 Wild type.  EGFR Unknown  TERT Unknown   Interval History:  Jillian NISSLEYpresents today after completing MRI brain. No new or progressive neurologic complaints today.  Gi upset has improved since starting the omeprazole over the counter.  Continues to have deficits with reading and right sided vision, maybe slightly worse from prior.  No right sided weakness. No seizures or headaches.  H+P (01/09/19) Patient presented to medical attention in early December with ~2 months history of right sided visual impairment.  She describes fuzzy or blurry vision on that side which was progressive over time, leading to at least one fall.  MRI brain demonstrated enhancing left occipital mass; this was subsequently resected by Dr. NKathyrn Sheriffon 12/23/18.  She had no issues with surgery and has completed her steroid taper.  Continues on keppra daily but no history of any seizure. She has returned to work with only modest difficutly.  No issues walking or performing ADLs; lives alone but her sister and other family members are nearby.  Medications: Current Outpatient Medications on File Prior to Visit  Medication Sig Dispense Refill  . Biotin 1 MG CAPS Take 1 mg by mouth daily.    .Arna Medici50 MCG tablet Take 50 mcg by mouth daily.    . naproxen sodium (ALEVE)  220 MG tablet Take 1 tablet (220 mg total) by mouth daily as needed (pain).    . ondansetron (ZOFRAN) 8 MG tablet Take 1 tablet (8 mg total) by mouth 2 (two) times daily as needed. Start on the third day after chemotherapy. 30 tablet 1  . atorvastatin (LIPITOR) 40 MG tablet Take 20 mg by mouth daily. (Patient not taking: Reported on 01/09/2020)    . CALCIUM PO Take 1 tablet by mouth daily. (Patient not taking: Reported on  01/09/2020)    . potassium chloride SA (KLOR-CON) 10 MEQ tablet Take 1 tablet (10 mEq total) by mouth daily. (Patient not taking: Reported on 01/09/2020) 30 tablet 3   No current facility-administered medications on file prior to visit.    Allergies:  Allergies  Allergen Reactions  . Etodolac Other (See Comments)    GI upset  . Macrobid [Nitrofurantoin] Rash   Past Medical History:  Past Medical History:  Diagnosis Date  . High cholesterol   . Hypothyroidism   . Joint pain    Past Surgical History:  Past Surgical History:  Procedure Laterality Date  . APPLICATION OF CRANIAL NAVIGATION Left 12/23/2018   Procedure: APPLICATION OF CRANIAL NAVIGATION;  Surgeon: Consuella Lose, MD;  Location: [facility];  Service: Neurosurgery;  Laterality: Left;  APPLICATION OF CRANIAL NAVIGATION  . BREAST BIOPSY Left   . COLONOSCOPY    . CRANIOTOMY Left 12/23/2018   Procedure: STEREOTACTIC LEFT OCCIPITAL CRANIOTOMY FOR RESECTION OF TUMOR;  Surgeon: Consuella Lose, MD;  Location: [facility];  Service: Neurosurgery;  Laterality: Left;  STEREOTACTIC LEFT OCCIPITAL CRANIOTOMY FOR RESECTION OF TUMOR  . LASIK     Social History:  Social History   Socioeconomic History  . Marital status: Single    Spouse name: Not on file  . Number of children: Not on file  . Years of education: Not on file  . Highest education level: Not on file  Occupational History  . Not on file  Tobacco Use  . Smoking status: Never Smoker  . Smokeless tobacco: Never Used  Vaping Use  . Vaping Use: Never used  Substance and Sexual Activity  . Alcohol use: Not Currently  . Drug use: Never  . Sexual activity: Not on file  Other Topics Concern  . Not on file  Social History Narrative  . Not on file   Social Determinants of Health   Financial Resource Strain: Not on file  Food Insecurity: Not on file  Transportation Needs: Not on file  Physical Activity: Not on file  Stress: Not on file  Social Connections: Not on file   Intimate Partner Violence: Not on file   Family History: No family history on file.  Review of Systems: Constitutional: Denies fevers, chills or abnormal weight loss Eyes: Denies blurriness of vision Gastrointestinal:  Denies nausea, constipation, diarrhea GU: Denies dysuria or incontinence Skin: Denies abnormal skin rashes Musculoskeletal: Denies joint pain, back or neck discomfort.  Behavioral/Psych: Denies anxiety, mood instability  Physical Exam: Vitals:   01/09/20 1241  BP: 140/89  Pulse: (!) 106  Resp: 15  Temp: 97.8 F (36.6 C)  SpO2: 100%   KPS: 80. General: Alert, cooperative, pleasant, in no acute distress Head: Craniotomy scar noted, dry and intact. EENT: No conjunctival injection or scleral icterus. Oral mucosa moist Lungs: Resp effort normal Cardiac: Regular rate and rhythm Abdomen: Soft, non-distended abdomen Skin: No rashes cyanosis or petechiae. Extremities: No clubbing or edema  Neurologic Exam: Mental Status: Awake, alert, attentive to examiner. Oriented  to self and environment. Language is notable for mild transcortical expressive dyshpasia. Cranial Nerves: Visual acuity is grossly normal. Right homonymous hemianopia. Extra-ocular movements intact. No ptosis. Face is symmetric, tongue midline. Motor: Tone and bulk are normal. Power is full in both arms and legs. Reflexes are symmetric, no pathologic reflexes present. Intact finger to nose bilaterally Sensory: Intact to light touch and temperature Gait: Normal and tandem gait is deferred.   Labs: I have reviewed the data as listed    Component Value Date/Time   NA 140 12/19/2019 1215   K 3.7 12/19/2019 1215   CL 108 12/19/2019 1215   CO2 22 12/19/2019 1215   GLUCOSE 95 12/19/2019 1215   BUN 20 12/19/2019 1215   CREATININE 0.82 12/19/2019 1215   CALCIUM 9.5 12/19/2019 1215   PROT 6.5 12/19/2019 1215   ALBUMIN 3.4 (L) 12/19/2019 1215   AST 23 12/19/2019 1215   ALT 19 12/19/2019 1215   ALKPHOS  81 12/19/2019 1215   BILITOT 0.6 12/19/2019 1215   GFRNONAA >60 12/19/2019 1215   GFRAA >60 10/03/2019 1133   Lab Results  Component Value Date   WBC 5.3 01/09/2020   NEUTROABS 3.2 01/09/2020   HGB 14.1 01/09/2020   HCT 42.3 01/09/2020   MCV 98.1 01/09/2020   PLT 270 01/09/2020   Imaging:  [facility] Clinician Interpretation: I have personally reviewed the CNS images as listed.  My interpretation, in the context of the patient's clinical presentation, is stable disease  MR BRAIN W WO CONTRAST  Result Date: 01/01/2020 CLINICAL DATA:  Brain/CNS neoplasm, assess treatment response. EXAM: MRI HEAD WITHOUT AND WITH CONTRAST TECHNIQUE: Multiplanar, multiecho pulse sequences of the brain and surrounding structures were obtained without and with intravenous contrast. CONTRAST:  7m GADAVIST GADOBUTROL 1 MMOL/ML IV SOLN COMPARISON:  MRI October 27, 2019. FINDINGS: Brain: Slight increase in bulk/conspicuity restricted diffusion along the posteromedial aspect of the left parieto-occipital mass (series 9, image 81). Otherwise, no substantial change in the appearance of the mass with heterogeneous enhancement peripherally and similar surrounding T2/FLAIR hyperintensity. Similar ex stent of T2/FLAIR hyperintensity into the left aspect of the corpus callosum without crossing midline. Similar postsurgical changes of prior debulking with smooth overlying dural enhancement. No evidence of acute infarct. No hydrocephalus. No acute hemorrhage. Similar remote blood products associated with the resection site. No midline shift. Vascular: Major arterial flow voids are maintained at the skull base. Skull and upper cervical spine: Left parieto-occipital craniotomy. Sinuses/Orbits: Sinuses are clear.  Unremarkable orbits. Other: No mastoid effusions. IMPRESSION: Slight increase in bulk/conspicuity of restricted diffusion along the posteromedial aspect of the left parieto-occipital mass, which could represent slight progression  versus treatment response. Otherwise, similar appearance of the mass and surrounding T2/FLAIR hyperintensity, as described above. Electronically Signed   By: FMargaretha SheffieldMD   On: 01/01/2020 10:23    Assessment/Plan Glioblastoma with isocitrate dehydrogenase gene wildtype ([place] [C71.9]   RNICOLLE HEWARDis clinically stable today.  MRI suggests stable disease, with no change in enhancing or T2 signal burden.  Increased extent of DWI signal is of unclear significance and will be followed serially.  PET-CT could be considered if these changes continue to evolve locally.   Labs demonstrate resolution of neutropenia and thrombocytopenia.  We recommended continuing with concurrent Avastin IV 190mkg, and alos initiating cycle #3 of CCNU 9027m2.  Chemotherapy should be held for the following:  ANC less than 1,000  Platelets less than 100,000  LFT or creatinine greater than 2x ULN  If clinical concerns/contraindications develop  Avastin should be held for the following:  ANC less than 500  Platelets less than 50,000  LFT or creatinine greater than 2x ULN  If clinical concerns/contraindications develop  Ok to continue omeprazole 43m daily, also ok with OTC probiotic.   We ask that RKALEISHA BHARGAVAreturn to clinic in 2 weeks for next avastin infusion, consideration of further cytotoxic chemotherapy.   All questions were answered. The patient knows to call the clinic with any problems, questions or concerns. No barriers to learning were detected.  I have spent a total of 40 minutes of face-to-face and non-face-to-face time, excluding clinical staff time, preparing to see patient, ordering tests and/or medications, counseling the patient, and independently interpreting results and communicating results to the patient/family/caregiver    ZVentura Sellers MD Medical Director of Neuro-Oncology [facility]at [place]01/04/22 12:50 PM

## 2020-01-10 ENCOUNTER — Telehealth: Payer: Self-pay | Admitting: Internal Medicine

## 2020-01-10 NOTE — Telephone Encounter (Signed)
Scheduled apt per 1/4 los - called pt - no answer . Left message for patient with appt date and time

## 2020-01-12 MED FILL — GLEOSTINE 100 MG CAPSULE: 100 | 1 days supply | Qty: 1 | Fill #0

## 2020-01-12 MED FILL — GLEOSTINE 40 MG CAPSULE: 40 | 1 days supply | Qty: 1 | Fill #0

## 2020-01-22 ENCOUNTER — Telehealth: Payer: Self-pay | Admitting: Internal Medicine

## 2020-01-22 NOTE — Telephone Encounter (Signed)
Rescheduled lab appointment time per weather conditions, patient has been called and notified of upcoming appointment changes.

## 2020-01-23 ENCOUNTER — Inpatient Hospital Stay: Payer: BC Managed Care – PPO

## 2020-01-23 ENCOUNTER — Inpatient Hospital Stay: Payer: BC Managed Care – PPO | Admitting: Internal Medicine

## 2020-01-23 ENCOUNTER — Other Ambulatory Visit: Payer: Self-pay

## 2020-01-23 ENCOUNTER — Other Ambulatory Visit: Payer: Self-pay | Admitting: Obstetrics and Gynecology

## 2020-01-23 VITALS — BP 142/82 | HR 86 | Temp 98.1°F | Resp 18 | Ht 62.0 in | Wt 106.4 lb

## 2020-01-23 DIAGNOSIS — E039 Hypothyroidism, unspecified: Secondary | ICD-10-CM | POA: Diagnosis not present

## 2020-01-23 DIAGNOSIS — Z1231 Encounter for screening mammogram for malignant neoplasm of breast: Secondary | ICD-10-CM

## 2020-01-23 DIAGNOSIS — Z923 Personal history of irradiation: Secondary | ICD-10-CM | POA: Diagnosis not present

## 2020-01-23 DIAGNOSIS — Z5112 Encounter for antineoplastic immunotherapy: Secondary | ICD-10-CM | POA: Diagnosis not present

## 2020-01-23 DIAGNOSIS — C714 Malignant neoplasm of occipital lobe: Secondary | ICD-10-CM | POA: Diagnosis not present

## 2020-01-23 DIAGNOSIS — E7800 Pure hypercholesterolemia, unspecified: Secondary | ICD-10-CM | POA: Diagnosis not present

## 2020-01-23 DIAGNOSIS — Z9221 Personal history of antineoplastic chemotherapy: Secondary | ICD-10-CM | POA: Diagnosis not present

## 2020-01-23 DIAGNOSIS — C719 Malignant neoplasm of brain, unspecified: Secondary | ICD-10-CM

## 2020-01-23 DIAGNOSIS — Z79899 Other long term (current) drug therapy: Secondary | ICD-10-CM | POA: Diagnosis not present

## 2020-01-23 LAB — CBC WITH DIFFERENTIAL (CANCER CENTER ONLY)
Abs Immature Granulocytes: 0.03 10*3/uL (ref 0.00–0.07)
Basophils Absolute: 0 10*3/uL (ref 0.0–0.1)
Basophils Relative: 1 %
Eosinophils Absolute: 0.1 10*3/uL (ref 0.0–0.5)
Eosinophils Relative: 2 %
HCT: 42.1 % (ref 36.0–46.0)
Hemoglobin: 14.1 g/dL (ref 12.0–15.0)
Immature Granulocytes: 1 %
Lymphocytes Relative: 18 %
Lymphs Abs: 0.8 10*3/uL (ref 0.7–4.0)
MCH: 32.1 pg (ref 26.0–34.0)
MCHC: 33.5 g/dL (ref 30.0–36.0)
MCV: 95.9 fL (ref 80.0–100.0)
Monocytes Absolute: 0.5 10*3/uL (ref 0.1–1.0)
Monocytes Relative: 11 %
Neutro Abs: 3.1 10*3/uL (ref 1.7–7.7)
Neutrophils Relative %: 67 %
Platelet Count: 240 10*3/uL (ref 150–400)
RBC: 4.39 MIL/uL (ref 3.87–5.11)
RDW: 13.8 % (ref 11.5–15.5)
WBC Count: 4.5 10*3/uL (ref 4.0–10.5)
nRBC: 0 % (ref 0.0–0.2)

## 2020-01-23 LAB — CMP (CANCER CENTER ONLY)
ALT: 14 U/L (ref 0–44)
AST: 25 U/L (ref 15–41)
Albumin: 4.1 g/dL (ref 3.5–5.0)
Alkaline Phosphatase: 66 U/L (ref 38–126)
Anion gap: 11 (ref 5–15)
BUN: 19 mg/dL (ref 8–23)
CO2: 25 mmol/L (ref 22–32)
Calcium: 9.9 mg/dL (ref 8.9–10.3)
Chloride: 102 mmol/L (ref 98–111)
Creatinine: 0.98 mg/dL (ref 0.44–1.00)
GFR, Estimated: >60 mL/min (ref >60)
Glucose, Bld: 91 mg/dL (ref 70–99)
Potassium: 3.8 mmol/L (ref 3.5–5.1)
Sodium: 138 mmol/L (ref 135–145)
Total Bilirubin: 0.8 mg/dL (ref 0.3–1.2)
Total Protein: 6.9 g/dL (ref 6.5–8.1)

## 2020-01-23 LAB — TOTAL PROTEIN, URINE DIPSTICK: Protein, ur: NEGATIVE mg/dL

## 2020-01-23 MED ORDER — BEVACIZUMAB-BVZR CHEMO INJECTION 400 MG/16ML
10.0000 mg/kg | Freq: Once | INTRAVENOUS | Status: AC
  Administered 2020-01-23: 500 mg via INTRAVENOUS
  Filled 2020-01-23: qty 16

## 2020-01-23 MED ORDER — SODIUM CHLORIDE 0.9 % IV SOLN
Freq: Once | INTRAVENOUS | Status: AC
  Filled 2020-01-23: qty 250

## 2020-01-23 NOTE — Progress Notes (Signed)
[facility] [place], [place] [address] [phone]   Interval Evaluation  Date of Service: 01/23/20 Patient Name: Jillian Gomez Patient MRN: [id number] Patient DOB: [birth year] Provider: Ventura Sellers, MD  Identifying Statement:  Jillian Gomez is a 65 y.o. female with left occipital glioblastoma   Oncologic History: Oncology History  Glioblastoma with isocitrate dehydrogenase gene wildtype ([place])  12/23/2018 Surgery   Craniotomy, left occipital resection by Dr. Kathyrn Sheriff.  Path is GBM IDH-wt   01/23/2019 - 03/03/2019 Radiation Therapy   IMRT with concurrent Temozolomide 62m/m2   03/28/2019 - 03/28/2019 Chemotherapy   The patient had temozolomide (TEMODAR) 20 MG capsule, 20 mg (100 % of original dose 20 mg), Oral, Daily, 1 of 1 cycle, Start date: 03/28/2019, End date: 05/30/2019 Dose modification: 20 mg (original dose 20 mg, Cycle 1) temozolomide (TEMODAR) 100 MG capsule, 300 mg, Oral, Daily, 1 of 1 cycle, Start date: 08/01/2019, End date: 09/04/2019 Dose modification: 200 mg (original dose 200 mg, Cycle 1), 300 mg (original dose 200 mg, Cycle 1)  for chemotherapy treatment.    09/04/2019 -  Chemotherapy   The patient had dexamethasone (DECADRON) 4 MG tablet, 8 mg, Oral, Daily, 2 of 2 cycles lomustine (CEENU) 100 MG capsule, 100 mg (100 % of original dose 100 mg), Oral,  Once, 2 of 2 cycles Dose modification: 100 mg (original dose 100 mg, Cycle 1) lomustine (CEENU) 40 MG capsule, 40 mg (100 % of original dose 40 mg), Oral,  Once, 2 of 2 cycles Dose modification: 40 mg (original dose 40 mg, Cycle 1)  for chemotherapy treatment.    09/19/2019 -  Chemotherapy   The patient had bevacizumab-bvzr (ZIRABEV) 600 mg in sodium chloride 0.9 % 100 mL chemo infusion, 10 mg/kg = 600 mg, Intravenous,  Once, 8 of 10 cycles Dose modification: 10 mg/kg (original dose 10 mg/kg, Cycle 9, Reason: Provider Judgment) Administration: 600 mg  (09/19/2019), 600 mg (10/17/2019), 600 mg (10/31/2019), 500 mg (12/05/2019), 600 mg (10/03/2019), 600 mg (11/14/2019), 500 mg (01/09/2020), 500 mg (12/19/2019)  for chemotherapy treatment.      Biomarkers:  MGMT Unknown.  IDH 1/2 Wild type.  EGFR Unknown  TERT Unknown   Interval History:  Jillian MORDANpresents today for avastin infusion. No new or progressive neurologic complaints today.  Occassional abdominal pain, self limited.  Continues to have deficits with reading and right sided vision, maybe slightly worse from prior.  No right sided weakness. No seizures or headaches.  H+P (01/09/19) Patient presented to medical attention in early December with ~2 months history of right sided visual impairment.  She describes fuzzy or blurry vision on that side which was progressive over time, leading to at least one fall.  MRI brain demonstrated enhancing left occipital mass; this was subsequently resected by Dr. NKathyrn Sheriffon 12/23/18.  She had no issues with surgery and has completed her steroid taper.  Continues on keppra daily but no history of any seizure. She has returned to work with only modest difficutly.  No issues walking or performing ADLs; lives alone but her sister and other family members are nearby.  Medications: Current Outpatient Medications on File Prior to Visit  Medication Sig Dispense Refill  . atorvastatin (LIPITOR) 40 MG tablet Take 20 mg by mouth daily. (Patient not taking: Reported on 01/09/2020)    . Biotin 1 MG CAPS Take 1 mg by mouth daily.    .Marland KitchenCALCIUM PO Take 1 tablet  by mouth daily. (Patient not taking: Reported on 01/09/2020)    . EUTHYROX 50 MCG tablet Take 50 mcg by mouth daily.    . naproxen sodium (ALEVE) 220 MG tablet Take 1 tablet (220 mg total) by mouth daily as needed (pain).    Marland Kitchen omeprazole (PRILOSEC) 20 MG capsule Take 1 capsule (20 mg total) by mouth daily. 30 capsule 3  . ondansetron (ZOFRAN) 8 MG tablet Take 1 tablet (8 mg total) by mouth 2 (two) times daily as  needed. Start on the third day after chemotherapy. 30 tablet 1  . potassium chloride SA (KLOR-CON) 10 MEQ tablet Take 1 tablet (10 mEq total) by mouth daily. (Patient not taking: Reported on 01/09/2020) 30 tablet 3   No current facility-administered medications on file prior to visit.    Allergies:  Allergies  Allergen Reactions  . Etodolac Other (See Comments)    GI upset  . Macrobid [Nitrofurantoin] Rash   Past Medical History:  Past Medical History:  Diagnosis Date  . High cholesterol   . Hypothyroidism   . Joint pain    Past Surgical History:  Past Surgical History:  Procedure Laterality Date  . APPLICATION OF CRANIAL NAVIGATION Left 12/23/2018   Procedure: APPLICATION OF CRANIAL NAVIGATION;  Surgeon: Consuella Lose, MD;  Location: [facility];  Service: Neurosurgery;  Laterality: Left;  APPLICATION OF CRANIAL NAVIGATION  . BREAST BIOPSY Left   . COLONOSCOPY    . CRANIOTOMY Left 12/23/2018   Procedure: STEREOTACTIC LEFT OCCIPITAL CRANIOTOMY FOR RESECTION OF TUMOR;  Surgeon: Consuella Lose, MD;  Location: [facility];  Service: Neurosurgery;  Laterality: Left;  STEREOTACTIC LEFT OCCIPITAL CRANIOTOMY FOR RESECTION OF TUMOR  . LASIK     Social History:  Social History   Socioeconomic History  . Marital status: Single    Spouse name: Not on file  . Number of children: Not on file  . Years of education: Not on file  . Highest education level: Not on file  Occupational History  . Not on file  Tobacco Use  . Smoking status: Never Smoker  . Smokeless tobacco: Never Used  Vaping Use  . Vaping Use: Never used  Substance and Sexual Activity  . Alcohol use: Not Currently  . Drug use: Never  . Sexual activity: Not on file  Other Topics Concern  . Not on file  Social History Narrative  . Not on file   Social Determinants of Health   Financial Resource Strain: Not on file  Food Insecurity: Not on file  Transportation Needs: Not on file  Physical Activity: Not on file   Stress: Not on file  Social Connections: Not on file  Intimate Partner Violence: Not on file   Family History: No family history on file.  Review of Systems: Constitutional: Denies fevers, chills or abnormal weight loss Eyes: Denies blurriness of vision Gastrointestinal:  Denies nausea, constipation, diarrhea GU: Denies dysuria or incontinence Skin: Denies abnormal skin rashes Musculoskeletal: Denies joint pain, back or neck discomfort.  Behavioral/Psych: Denies anxiety, mood instability  Physical Exam: Vitals:   01/23/20 1024  BP: (!) 142/82  Pulse: 86  Resp: 18  Temp: 98.1 F (36.7 C)  SpO2: 100%   KPS: 80. General: Alert, cooperative, pleasant, in no acute distress Head: Craniotomy scar noted, dry and intact. EENT: No conjunctival injection or scleral icterus. Oral mucosa moist Lungs: Resp effort normal Cardiac: Regular rate and rhythm Abdomen: Soft, non-distended abdomen Skin: No rashes cyanosis or petechiae. Extremities: No clubbing or edema  Neurologic Exam: Mental Status: Awake, alert, attentive to examiner. Oriented to self and environment. Language is notable for mild transcortical expressive dyshpasia. Cranial Nerves: Visual acuity is grossly normal. Right homonymous hemianopia. Extra-ocular movements intact. No ptosis. Face is symmetric, tongue midline. Motor: Tone and bulk are normal. Power is full in both arms and legs. Reflexes are symmetric, no pathologic reflexes present. Intact finger to nose bilaterally Sensory: Intact to light touch and temperature Gait: Normal and tandem gait is deferred.   Labs: I have reviewed the data as listed    Component Value Date/Time   NA 138 01/09/2020 1211   K 4.0 01/09/2020 1211   CL 106 01/09/2020 1211   CO2 23 01/09/2020 1211   GLUCOSE 101 (H) 01/09/2020 1211   BUN 13 01/09/2020 1211   CREATININE 1.02 (H) 01/09/2020 1211   CALCIUM 9.9 01/09/2020 1211   PROT 7.1 01/09/2020 1211   ALBUMIN 3.8 01/09/2020 1211    AST 19 01/09/2020 1211   ALT 16 01/09/2020 1211   ALKPHOS 77 01/09/2020 1211   BILITOT 0.5 01/09/2020 1211   GFRNONAA >60 01/09/2020 1211   GFRAA >60 10/03/2019 1133   Lab Results  Component Value Date   WBC 5.3 01/09/2020   NEUTROABS 3.2 01/09/2020   HGB 14.1 01/09/2020   HCT 42.3 01/09/2020   MCV 98.1 01/09/2020   PLT 270 01/09/2020    Assessment/Plan Glioblastoma with isocitrate dehydrogenase gene wildtype ([place]) [C71.9]   Jillian Gomez is clinically stable today. Labs are within normal limits.  We recommended continuing with concurrent Avastin IV 50m/kg, and continue cycle #3 of CCNU 963mm2, day 10/42.  Chemotherapy should be held for the following:  ANC less than 1,000  Platelets less than 100,000  LFT or creatinine greater than 2x ULN  If clinical concerns/contraindications develop  Avastin should be held for the following:  ANC less than 500  Platelets less than 50,000  LFT or creatinine greater than 2x ULN  If clinical concerns/contraindications develop  Ok to continue omeprazole 2042maily, also ok with OTC probiotic.   We ask that Jillian MCALEERturn to clinic in 3 weeks for next avastin infusion, next MRI can be in 5 weeks.  All questions were answered. The patient knows to call the clinic with any problems, questions or concerns. No barriers to learning were detected.  I have spent a total of 30 minutes of face-to-face and non-face-to-face time, excluding clinical staff time, preparing to see patient, ordering tests and/or medications, counseling the patient, and independently interpreting results and communicating results to the patient/family/caregiver    ZacVentura SellersD Medical Director of Neuro-Oncology [facility] WesH. Rivera Colon/18/22 10:16 AM

## 2020-01-23 NOTE — Patient Instructions (Signed)
[facility] Discharge Instructions for Patients Receiving Chemotherapy  Today you received the following chemotherapy agents: Bevacizumab (Avastin).  To help prevent nausea and vomiting after your treatment, we encourage you to take your nausea medication as prescribed.  If you develop nausea and vomiting that is not controlled by your nausea medication, call the clinic.   BELOW ARE SYMPTOMS THAT SHOULD BE REPORTED IMMEDIATELY:  *FEVER GREATER THAN 100.5 F  *CHILLS WITH OR WITHOUT FEVER  NAUSEA AND VOMITING THAT IS NOT CONTROLLED WITH YOUR NAUSEA MEDICATION  *UNUSUAL SHORTNESS OF BREATH  *UNUSUAL BRUISING OR BLEEDING  TENDERNESS IN MOUTH AND THROAT WITH OR WITHOUT PRESENCE OF ULCERS  *URINARY PROBLEMS  *BOWEL PROBLEMS  UNUSUAL RASH Items with * indicate a potential emergency and should be followed up as soon as possible.  Feel free to call the clinic should you have any questions or concerns. The clinic phone number is [phone].  Please show the CHEMO ALERT CARD at check-in to the Emergency Department and triage nurse.   

## 2020-02-02 ENCOUNTER — Other Ambulatory Visit: Payer: Self-pay | Admitting: Radiation Therapy

## 2020-02-02 ENCOUNTER — Telehealth: Payer: Self-pay | Admitting: Radiation Therapy

## 2020-02-02 NOTE — Telephone Encounter (Addendum)
Spoke with the patient's sister, Jeannene Patella, about her upcoming brain MRI in February. The follow-up will be added to the 2/22 scheduled Avastin infusion, since the next appointment will be due on 2/22 anyway.   Mont Dutton R.T.(R)(T) Radiation Special Procedures Navigator

## 2020-02-13 ENCOUNTER — Other Ambulatory Visit: Payer: Self-pay

## 2020-02-13 ENCOUNTER — Inpatient Hospital Stay (HOSPITAL_BASED_OUTPATIENT_CLINIC_OR_DEPARTMENT_OTHER): Payer: BC Managed Care – PPO | Admitting: Internal Medicine

## 2020-02-13 ENCOUNTER — Inpatient Hospital Stay: Payer: BC Managed Care – PPO | Attending: Internal Medicine

## 2020-02-13 ENCOUNTER — Inpatient Hospital Stay: Payer: BC Managed Care – PPO

## 2020-02-13 VITALS — BP 122/88 | HR 100 | Temp 97.7°F | Resp 16 | Ht 62.0 in | Wt 104.2 lb

## 2020-02-13 DIAGNOSIS — C719 Malignant neoplasm of brain, unspecified: Secondary | ICD-10-CM

## 2020-02-13 DIAGNOSIS — Z5112 Encounter for antineoplastic immunotherapy: Secondary | ICD-10-CM | POA: Diagnosis not present

## 2020-02-13 DIAGNOSIS — E7800 Pure hypercholesterolemia, unspecified: Secondary | ICD-10-CM | POA: Insufficient documentation

## 2020-02-13 DIAGNOSIS — Z923 Personal history of irradiation: Secondary | ICD-10-CM | POA: Insufficient documentation

## 2020-02-13 DIAGNOSIS — Z9221 Personal history of antineoplastic chemotherapy: Secondary | ICD-10-CM | POA: Diagnosis not present

## 2020-02-13 DIAGNOSIS — E039 Hypothyroidism, unspecified: Secondary | ICD-10-CM | POA: Insufficient documentation

## 2020-02-13 DIAGNOSIS — C714 Malignant neoplasm of occipital lobe: Secondary | ICD-10-CM | POA: Insufficient documentation

## 2020-02-13 DIAGNOSIS — Z79899 Other long term (current) drug therapy: Secondary | ICD-10-CM | POA: Diagnosis not present

## 2020-02-13 DIAGNOSIS — D696 Thrombocytopenia, unspecified: Secondary | ICD-10-CM | POA: Insufficient documentation

## 2020-02-13 LAB — CMP (CANCER CENTER ONLY)
ALT: 13 U/L (ref 0–44)
AST: 21 U/L (ref 15–41)
Albumin: 3.7 g/dL (ref 3.5–5.0)
Alkaline Phosphatase: 77 U/L (ref 38–126)
Anion gap: 10 (ref 5–15)
BUN: 22 mg/dL (ref 8–23)
CO2: 21 mmol/L — ABNORMAL LOW (ref 22–32)
Calcium: 9.6 mg/dL (ref 8.9–10.3)
Chloride: 110 mmol/L (ref 98–111)
Creatinine: 1 mg/dL (ref 0.44–1.00)
GFR, Estimated: >60 mL/min (ref >60)
Glucose, Bld: 96 mg/dL (ref 70–99)
Potassium: 3.9 mmol/L (ref 3.5–5.1)
Sodium: 141 mmol/L (ref 135–145)
Total Bilirubin: 0.6 mg/dL (ref 0.3–1.2)
Total Protein: 6.7 g/dL (ref 6.5–8.1)

## 2020-02-13 LAB — CBC WITH DIFFERENTIAL (CANCER CENTER ONLY)
Abs Immature Granulocytes: 0.01 10*3/uL (ref 0.00–0.07)
Basophils Absolute: 0 10*3/uL (ref 0.0–0.1)
Basophils Relative: 0 %
Eosinophils Absolute: 0.2 10*3/uL (ref 0.0–0.5)
Eosinophils Relative: 3 %
HCT: 39.5 % (ref 36.0–46.0)
Hemoglobin: 13.3 g/dL (ref 12.0–15.0)
Immature Granulocytes: 0 %
Lymphocytes Relative: 21 %
Lymphs Abs: 1.1 10*3/uL (ref 0.7–4.0)
MCH: 32 pg (ref 26.0–34.0)
MCHC: 33.7 g/dL (ref 30.0–36.0)
MCV: 95.2 fL (ref 80.0–100.0)
Monocytes Absolute: 0.4 10*3/uL (ref 0.1–1.0)
Monocytes Relative: 8 %
Neutro Abs: 3.5 10*3/uL (ref 1.7–7.7)
Neutrophils Relative %: 68 %
Platelet Count: 45 10*3/uL — ABNORMAL LOW (ref 150–400)
RBC: 4.15 MIL/uL (ref 3.87–5.11)
RDW: 13.8 % (ref 11.5–15.5)
WBC Count: 5.2 10*3/uL (ref 4.0–10.5)
nRBC: 0 % (ref 0.0–0.2)

## 2020-02-13 LAB — TOTAL PROTEIN, URINE DIPSTICK

## 2020-02-13 NOTE — Progress Notes (Signed)
[facility] at [place], [place] [address] [phone]   Interval Evaluation  Date of Service: 02/13/20 Patient Name: Jillian Gomez Patient MRN: [id number] Patient DOB: 30-Apr-1955 Provider: Ventura Sellers, MD  Identifying Statement:  Jillian Gomez is a 65 y.o. female with left occipital glioblastoma   Oncologic History: Oncology History  Glioblastoma with isocitrate dehydrogenase gene wildtype ([place])  12/23/2018 Surgery   Craniotomy, left occipital resection by Dr. Kathyrn Sheriff.  Path is GBM IDH-wt   01/23/2019 - 03/03/2019 Radiation Therapy   IMRT with concurrent Temozolomide 12m/m2   03/28/2019 - 09/04/2019 Chemotherapy   5 cycles of adjuvant 5-day Temozolomide 2043mm2    09/01/2019 Progression   Progression of disease #1   09/19/2019 -  Chemotherapy   Begins second line therapy with oral CCNU 9048m2 q6 weeks, concurrent Avastin 1m64m IV q2 weeks     Biomarkers:  MGMT Unknown.  IDH 1/2 Wild type.  EGFR Unknown  TERT Unknown   Interval History:  RondKRYSTL WICKWAREsents today for avastin infusion. No new or progressive neurologic complaints today.  No recurrence in abdominal symptoms.  Continues to have deficits with reading and right sided vision, maybe slightly worse from prior.  No right sided weakness. No seizures or headaches.  H+P (01/09/19) Patient presented to medical attention in early December with ~2 months history of right sided visual impairment.  She describes fuzzy or blurry vision on that side which was progressive over time, leading to at least one fall.  MRI brain demonstrated enhancing left occipital mass; this was subsequently resected by Dr. NundKathyrn Sheriff12/18/20.  She had no issues with surgery and has completed her steroid taper.  Continues on keppra daily but no history of any seizure. She has returned to work with only modest difficutly.  No issues walking or performing ADLs; lives alone but her  sister and other family members are nearby.  Medications: Current Outpatient Medications on File Prior to Visit  Medication Sig Dispense Refill  . Biotin 1 MG CAPS Take 1 mg by mouth daily.    . CAMarland KitchenCIUM PO Take 1 tablet by mouth daily.    . EUArna MediciMCG tablet Take 50 mcg by mouth daily.    . naproxen sodium (ALEVE) 220 MG tablet Take 1 tablet (220 mg total) by mouth daily as needed (pain).    . omMarland Kitchenprazole (PRILOSEC) 20 MG capsule Take 1 capsule (20 mg total) by mouth daily. 30 capsule 3  . ondansetron (ZOFRAN) 8 MG tablet Take 1 tablet (8 mg total) by mouth 2 (two) times daily as needed. Start on the third day after chemotherapy. 30 tablet 1  . atorvastatin (LIPITOR) 40 MG tablet Take 20 mg by mouth daily. (Patient not taking: Reported on 01/09/2020)    . potassium chloride SA (KLOR-CON) 10 MEQ tablet Take 1 tablet (10 mEq total) by mouth daily. (Patient not taking: No sig reported) 30 tablet 3   No current facility-administered medications on file prior to visit.    Allergies:  Allergies  Allergen Reactions  . Etodolac Other (See Comments)    GI upset  . Macrobid [Nitrofurantoin] Rash   Past Medical History:  Past Medical History:  Diagnosis Date  . High cholesterol   . Hypothyroidism   . Joint pain    Past Surgical History:  Past Surgical History:  Procedure Laterality Date  . APPLICATION OF CRANIAL NAVIGATION Left 12/23/2018   Procedure: APPLICATION OF CRANIAL NAVIGATION;  Surgeon: Consuella Lose, MD;  Location: [facility];  Service: Neurosurgery;  Laterality: Left;  APPLICATION OF CRANIAL NAVIGATION  . BREAST BIOPSY Left   . COLONOSCOPY    . CRANIOTOMY Left 12/23/2018   Procedure: STEREOTACTIC LEFT OCCIPITAL CRANIOTOMY FOR RESECTION OF TUMOR;  Surgeon: Consuella Lose, MD;  Location: [facility];  Service: Neurosurgery;  Laterality: Left;  STEREOTACTIC LEFT OCCIPITAL CRANIOTOMY FOR RESECTION OF TUMOR  . LASIK     Social History:  Social History   Socioeconomic History   . Marital status: Single    Spouse name: Not on file  . Number of children: Not on file  . Years of education: Not on file  . Highest education level: Not on file  Occupational History  . Not on file  Tobacco Use  . Smoking status: Never Smoker  . Smokeless tobacco: Never Used  Vaping Use  . Vaping Use: Never used  Substance and Sexual Activity  . Alcohol use: Not Currently  . Drug use: Never  . Sexual activity: Not on file  Other Topics Concern  . Not on file  Social History Narrative  . Not on file   Social Determinants of Health   Financial Resource Strain: Not on file  Food Insecurity: Not on file  Transportation Needs: Not on file  Physical Activity: Not on file  Stress: Not on file  Social Connections: Not on file  Intimate Partner Violence: Not on file   Family History: No family history on file.  Review of Systems: Constitutional: Denies fevers, chills or abnormal weight loss Eyes: Denies blurriness of vision Gastrointestinal:  Denies nausea, constipation, diarrhea GU: Denies dysuria or incontinence Skin: Denies abnormal skin rashes Musculoskeletal: Denies joint pain, back or neck discomfort.  Behavioral/Psych: Denies anxiety, mood instability  Physical Exam: Vitals:   02/13/20 1157  BP: 122/88  Pulse: 100  Resp: 16  Temp: 97.7 F (36.5 C)  SpO2: 100%   KPS: 80. General: Alert, cooperative, pleasant, in no acute distress Head: Craniotomy scar noted, dry and intact. EENT: No conjunctival injection or scleral icterus. Oral mucosa moist Lungs: Resp effort normal Cardiac: Regular rate and rhythm Abdomen: Soft, non-distended abdomen Skin: No rashes cyanosis or petechiae. Extremities: No clubbing or edema  Neurologic Exam: Mental Status: Awake, alert, attentive to examiner. Oriented to self and environment. Language is notable for mild transcortical expressive dyshpasia. Cranial Nerves: Visual acuity is grossly normal. Right homonymous hemianopia.  Extra-ocular movements intact. No ptosis. Face is symmetric, tongue midline. Motor: Tone and bulk are normal. Power is full in both arms and legs. Reflexes are symmetric, no pathologic reflexes present. Intact finger to nose bilaterally Sensory: Intact to light touch and temperature Gait: Normal and tandem gait is deferred.   Labs: I have reviewed the data as listed    Component Value Date/Time   NA 138 01/23/2020 1001   K 3.8 01/23/2020 1001   CL 102 01/23/2020 1001   CO2 25 01/23/2020 1001   GLUCOSE 91 01/23/2020 1001   BUN 19 01/23/2020 1001   CREATININE 0.98 01/23/2020 1001   CALCIUM 9.9 01/23/2020 1001   PROT 6.9 01/23/2020 1001   ALBUMIN 4.1 01/23/2020 1001   AST 25 01/23/2020 1001   ALT 14 01/23/2020 1001   ALKPHOS 66 01/23/2020 1001   BILITOT 0.8 01/23/2020 1001   GFRNONAA >60 01/23/2020 1001   GFRAA >60 10/03/2019 1133   Lab Results  Component Value Date   WBC 5.2 02/13/2020   NEUTROABS 3.5 02/13/2020   HGB 13.3  02/13/2020   HCT 39.5 02/13/2020   MCV 95.2 02/13/2020   PLT 45 (L) 02/13/2020    Assessment/Plan Glioblastoma with isocitrate dehydrogenase gene wildtype ([place]) [C71.9]   KAMBRE MESSNER is clinically stable today. Unfortunately labs demonstrate thrombocytopenia (45k) and she is not cleared to undergo avastin treatment today.  We recommended deferring next infusion until next scheduled visit in 2 weeks.  She has almost completed cycle #3 oral CCNU 40m/m2 as well (day 31/42).  Chemotherapy should be held for the following:  ANC less than 1,000  Platelets less than 100,000  LFT or creatinine greater than 2x ULN  If clinical concerns/contraindications develop  Avastin should be held for the following:  ANC less than 500  Platelets less than 50,000  LFT or creatinine greater than 2x ULN  If clinical concerns/contraindications develop  We ask that Jillian Coombesreturn to clinic in 2 weeks with MRI brain for evaluation, consideration of 4th  cycle of CCNU+avastin.  All questions were answered. The patient knows to call the clinic with any problems, questions or concerns. No barriers to learning were detected.  I have spent a total of 30 minutes of face-to-face and non-face-to-face time, excluding clinical staff time, preparing to see patient, ordering tests and/or medications, counseling the patient, and independently interpreting results and communicating results to the patient/family/caregiver    ZVentura Sellers MD Medical Director of Neuro-Oncology [facility]02/08/22 12:03 PM

## 2020-02-20 ENCOUNTER — Telehealth: Payer: Self-pay | Admitting: Internal Medicine

## 2020-02-20 NOTE — Telephone Encounter (Signed)
Scheduled per 02/08 los, patient has been called and voicemail was left. 

## 2020-02-22 ENCOUNTER — Ambulatory Visit (HOSPITAL_COMMUNITY): Payer: BC Managed Care – PPO

## 2020-02-23 ENCOUNTER — Ambulatory Visit (HOSPITAL_COMMUNITY): Payer: BC Managed Care – PPO

## 2020-02-24 ENCOUNTER — Ambulatory Visit (HOSPITAL_COMMUNITY)
Admission: RE | Admit: 2020-02-24 | Discharge: 2020-02-24 | Disposition: A | Discharge status: Home / Self Care | Payer: BC Managed Care – PPO | Source: Ambulatory Visit | Attending: Internal Medicine | Admitting: Internal Medicine

## 2020-02-24 ENCOUNTER — Other Ambulatory Visit: Payer: Self-pay

## 2020-02-24 DIAGNOSIS — C714 Malignant neoplasm of occipital lobe: Secondary | ICD-10-CM | POA: Diagnosis not present

## 2020-02-24 DIAGNOSIS — C719 Malignant neoplasm of brain, unspecified: Secondary | ICD-10-CM | POA: Diagnosis not present

## 2020-02-24 IMAGING — MR MR HEAD WO/W CM
14 series · 48 of 48 positions shown · IV contrast (gadavist)
Comparison: [DATE] and multiple previous

CLINICAL DATA: Follow-up left parieto-occipital glioblastoma.
Previous resection and radiation.

EXAM:
MRI HEAD WITHOUT AND WITH CONTRAST
TECHNIQUE: Multiplanar, multiecho pulse sequences of the brain and surrounding
structures were obtained without and with intravenous contrast.
CONTRAST:  5mL GADAVIST GADOBUTROL 1 MMOL/ML IV SOLN

[Series 5: DWI · axial · 3.0mm · 1.36mm/px · z∈[-50,+100]mm · 4 of 104 slices shown (1 of 4)]
[im 1/104]
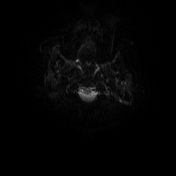
[im 35/104]
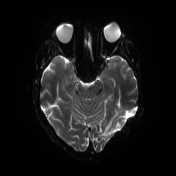
[im 69/104]
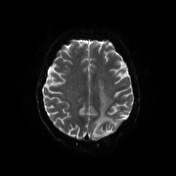
[im 104/104]
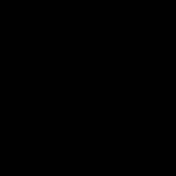

[Series 6: DWI · axial · 3.0mm · 1.36mm/px · z∈[-50,+97]mm · 3 of 51 slices shown (2 of 4)]
[im 1/51]
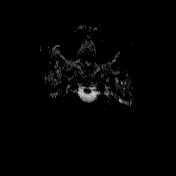
[im 26/51]
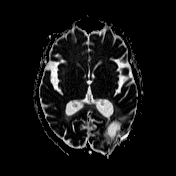
[im 51/51]
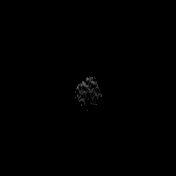

[Series 7: T1 · sagittal · 5.0mm · 0.75mm/px · 1 of 24 slices shown (1 of 2)]
[im 1/24]
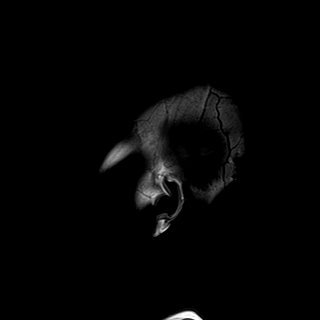

[Series 8: T2 · axial · 5.0mm · 0.62mm/px · 1 of 26 slices shown]
[im 1/26]
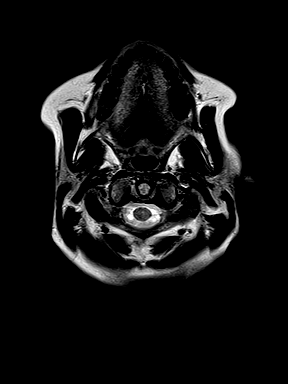

[Series 9: mip_images(sw) · axial · 24.0mm · 0.75mm/px · z∈[-79,+109]mm · 3 of 65 slices shown]
[im 1/65]
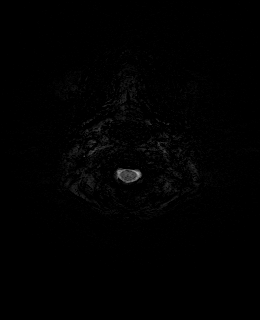
[im 33/65]
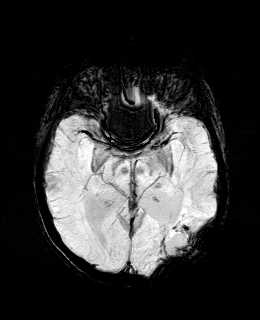
[im 65/65]
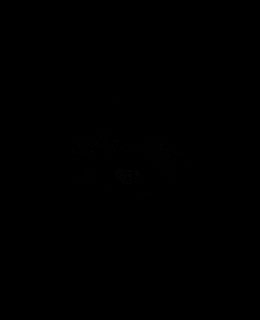

[Series 10: swi_images · axial · 3.0mm · 0.75mm/px · z∈[-89,+119]mm · 4 of 72 slices shown]
[im 1/72]
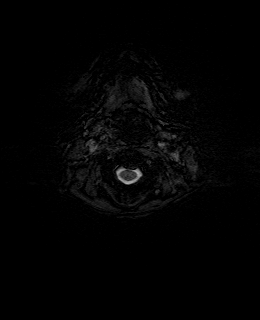
[im 24/72]
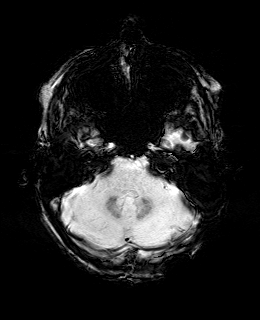
[im 48/72]
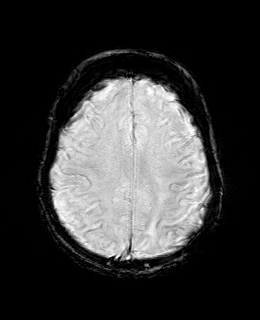
[im 72/72]
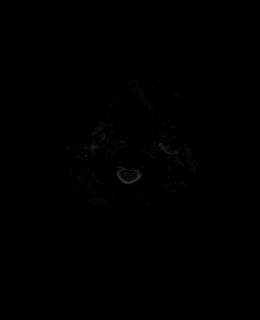

[Series 11: FLAIR · axial · 3.0mm · 0.75mm/px · z∈[-64,+91]mm · 3 of 54 slices shown]
[im 1/54]
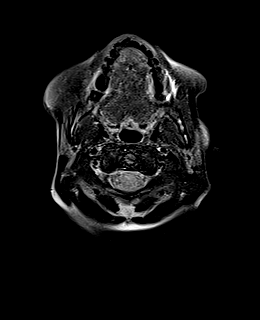
[im 27/54]
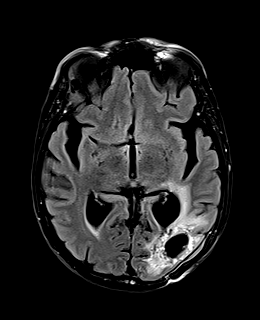
[im 54/54]
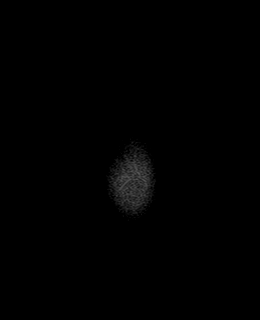

[Series 12: T1 · axial · 1.0mm · 0.94mm/px · z∈[-67,+105]mm · 9 of 176 slices shown (2 of 2)]
[im 1/176]
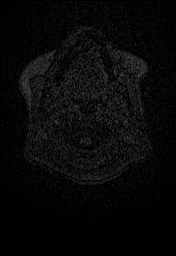
[im 22/176]
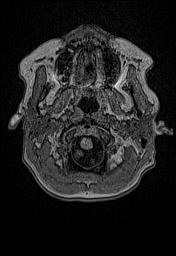
[im 44/176]
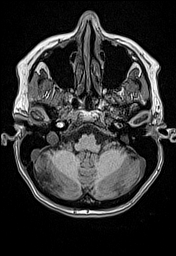
[im 66/176]
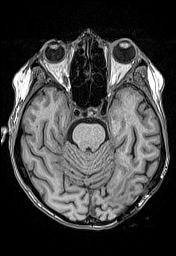
[im 88/176]
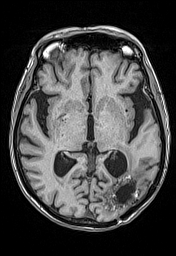
[im 110/176]
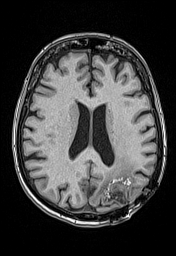
[im 132/176]
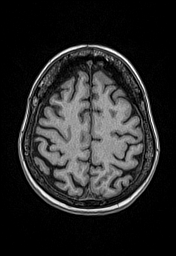
[im 154/176]
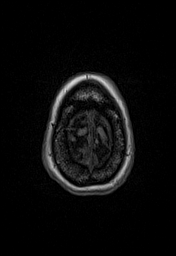
[im 176/176]
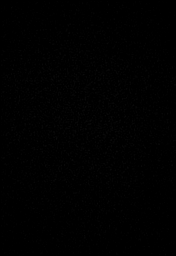

[Series 13: DWI · coronal · 5.0mm · 1.31mm/px · 4 of 72 slices shown (3 of 4)]
[im 1/72]
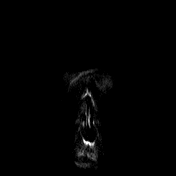
[im 24/72]
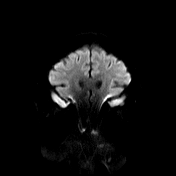
[im 48/72]
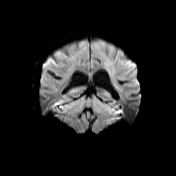
[im 72/72]
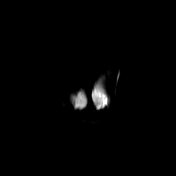

[Series 14: DWI · coronal · 5.0mm · 1.31mm/px · 2 of 36 slices shown (4 of 4)]
[im 1/36]
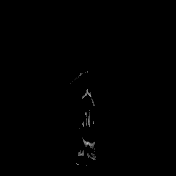
[im 36/36]
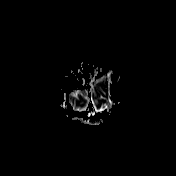

[Series 15: T2 post-contrast · coronal · 5.0mm · 0.57mm/px · 2 of 30 slices shown]
[im 1/30]
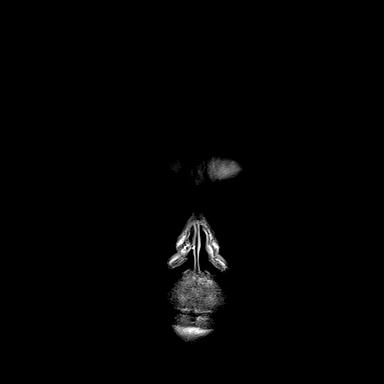
[im 30/30]
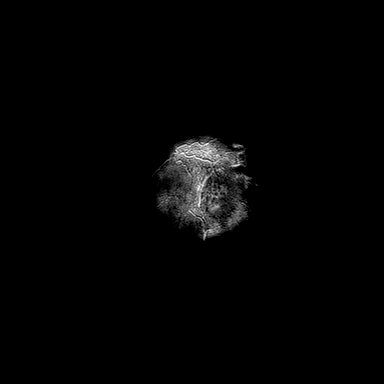

[Series 16: T1 post-contrast · axial · 1.0mm · 0.94mm/px · z∈[-67,+105]mm · 9 of 176 slices shown (1 of 3)]
[im 1/176]
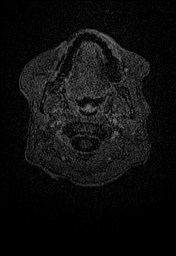
[im 22/176]
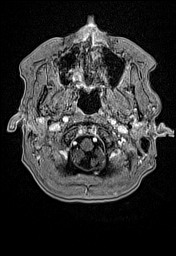
[im 44/176]
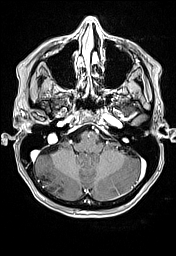
[im 66/176]
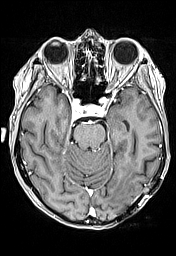
[im 88/176]
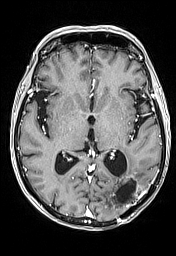
[im 110/176]
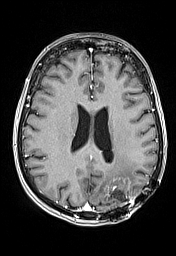
[im 132/176]
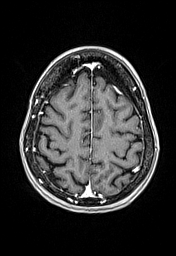
[im 154/176]
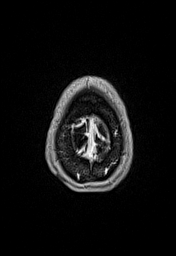
[im 176/176]
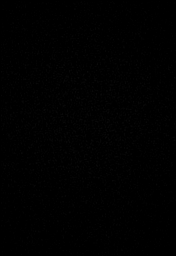

[Series 17: T1 post-contrast · coronal · 5.0mm · 0.43mm/px · 2 of 30 slices shown (2 of 3)]
[im 1/30]
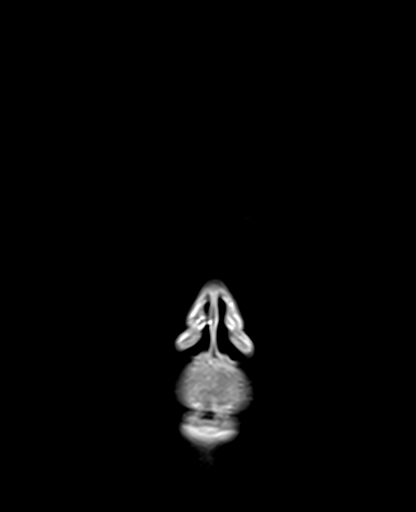
[im 30/30]
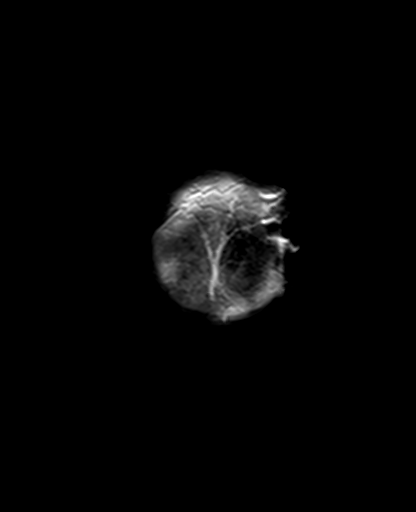

[Series 18: T1 post-contrast · sagittal · 5.0mm · 0.75mm/px · 1 of 24 slices shown (3 of 3)]
[im 1/24]
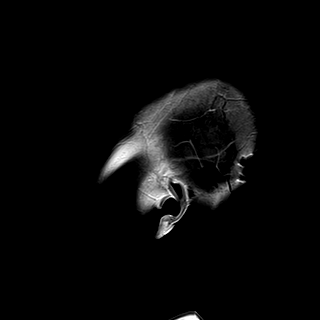

[48 of 48 positions shown; findings below may reference images not displayed]

FINDINGS: Brain: The only change since the previous study is slightly more
extensive FLAIR and T2 signal within the white matter inferior and
superior to the lesion. Actual treated lesion itself does not show a
changes in size. Post resection space is similar as well. No change
in mass effect. No significant change in the enhancement pattern or
pattern of restricted diffusion. No evidence of new satellite
lesion.

The brain is otherwise unremarkable. No stroke, hydrocephalus or
extra-axial collection

Vascular: Major vessels at the base of the brain show flow.

Skull and upper cervical spine: Negative

Sinuses/Orbits: Clear/normal

Other: None
IMPRESSION: Slight increased FLAIR and T2 signal within the white matter
inferior and superior to the treated lesion. No evidence of
increasing mass effect, restricted diffusion or progressive
enhancement. The significance of this adjacent white matter signal
is doubtful. I think the examination is stable with respect to the
significant findings since [DATE].

## 2020-02-24 MED ORDER — GADOBUTROL 1 MMOL/ML IV SOLN
5.0000 mL | Freq: Once | INTRAVENOUS | Status: AC | PRN
  Administered 2020-02-24: 5 mL via INTRAVENOUS

## 2020-02-26 ENCOUNTER — Ambulatory Visit: Payer: BC Managed Care – PPO

## 2020-02-26 ENCOUNTER — Encounter: Payer: Self-pay | Admitting: Internal Medicine

## 2020-02-26 ENCOUNTER — Telehealth: Payer: Self-pay | Admitting: Internal Medicine

## 2020-02-26 ENCOUNTER — Other Ambulatory Visit: Payer: Self-pay

## 2020-02-26 ENCOUNTER — Other Ambulatory Visit: Payer: Self-pay | Admitting: Internal Medicine

## 2020-02-26 ENCOUNTER — Ambulatory Visit: Payer: BC Managed Care – PPO | Admitting: Internal Medicine

## 2020-02-26 ENCOUNTER — Inpatient Hospital Stay: Payer: BC Managed Care – PPO | Admitting: Internal Medicine

## 2020-02-26 ENCOUNTER — Inpatient Hospital Stay: Payer: BC Managed Care – PPO

## 2020-02-26 VITALS — BP 134/77 | HR 97 | Temp 97.4°F | Resp 18 | Ht 62.0 in | Wt 101.9 lb

## 2020-02-26 DIAGNOSIS — D709 Neutropenia, unspecified: Secondary | ICD-10-CM | POA: Insufficient documentation

## 2020-02-26 DIAGNOSIS — T451X5A Adverse effect of antineoplastic and immunosuppressive drugs, initial encounter: Secondary | ICD-10-CM | POA: Diagnosis not present

## 2020-02-26 DIAGNOSIS — C719 Malignant neoplasm of brain, unspecified: Secondary | ICD-10-CM | POA: Diagnosis not present

## 2020-02-26 DIAGNOSIS — D696 Thrombocytopenia, unspecified: Secondary | ICD-10-CM | POA: Diagnosis not present

## 2020-02-26 DIAGNOSIS — C714 Malignant neoplasm of occipital lobe: Secondary | ICD-10-CM | POA: Diagnosis not present

## 2020-02-26 DIAGNOSIS — D701 Agranulocytosis secondary to cancer chemotherapy: Secondary | ICD-10-CM | POA: Diagnosis not present

## 2020-02-26 DIAGNOSIS — Z9221 Personal history of antineoplastic chemotherapy: Secondary | ICD-10-CM | POA: Diagnosis not present

## 2020-02-26 DIAGNOSIS — E7800 Pure hypercholesterolemia, unspecified: Secondary | ICD-10-CM | POA: Diagnosis not present

## 2020-02-26 DIAGNOSIS — Z923 Personal history of irradiation: Secondary | ICD-10-CM | POA: Diagnosis not present

## 2020-02-26 DIAGNOSIS — Z5112 Encounter for antineoplastic immunotherapy: Secondary | ICD-10-CM | POA: Diagnosis not present

## 2020-02-26 DIAGNOSIS — E039 Hypothyroidism, unspecified: Secondary | ICD-10-CM | POA: Diagnosis not present

## 2020-02-26 DIAGNOSIS — Z79899 Other long term (current) drug therapy: Secondary | ICD-10-CM | POA: Diagnosis not present

## 2020-02-26 HISTORY — DX: Thrombocytopenia, unspecified: D69.6

## 2020-02-26 LAB — CMP (CANCER CENTER ONLY)
ALT: 12 U/L (ref 0–44)
AST: 20 U/L (ref 15–41)
Albumin: 3.8 g/dL (ref 3.5–5.0)
Alkaline Phosphatase: 81 U/L (ref 38–126)
Anion gap: 10 (ref 5–15)
BUN: 18 mg/dL (ref 8–23)
CO2: 23 mmol/L (ref 22–32)
Calcium: 9.5 mg/dL (ref 8.9–10.3)
Chloride: 106 mmol/L (ref 98–111)
Creatinine: 0.94 mg/dL (ref 0.44–1.00)
GFR, Estimated: >60 mL/min (ref >60)
Glucose, Bld: 93 mg/dL (ref 70–99)
Potassium: 4.1 mmol/L (ref 3.5–5.1)
Sodium: 139 mmol/L (ref 135–145)
Total Bilirubin: 0.4 mg/dL (ref 0.3–1.2)
Total Protein: 6.8 g/dL (ref 6.5–8.1)

## 2020-02-26 LAB — CBC WITH DIFFERENTIAL (CANCER CENTER ONLY)
Abs Immature Granulocytes: 0 10*3/uL (ref 0.00–0.07)
Basophils Absolute: 0 10*3/uL (ref 0.0–0.1)
Basophils Relative: 1 %
Eosinophils Absolute: 0.1 10*3/uL (ref 0.0–0.5)
Eosinophils Relative: 2 %
HCT: 36.3 % (ref 36.0–46.0)
Hemoglobin: 12.2 g/dL (ref 12.0–15.0)
Immature Granulocytes: 0 %
Lymphocytes Relative: 43 %
Lymphs Abs: 1.1 10*3/uL (ref 0.7–4.0)
MCH: 32.4 pg (ref 26.0–34.0)
MCHC: 33.6 g/dL (ref 30.0–36.0)
MCV: 96.5 fL (ref 80.0–100.0)
Monocytes Absolute: 0.3 10*3/uL (ref 0.1–1.0)
Monocytes Relative: 14 %
Neutro Abs: 1 10*3/uL — ABNORMAL LOW (ref 1.7–7.7)
Neutrophils Relative %: 40 %
Platelet Count: 88 10*3/uL — ABNORMAL LOW (ref 150–400)
RBC: 3.76 MIL/uL — ABNORMAL LOW (ref 3.87–5.11)
RDW: 14.1 % (ref 11.5–15.5)
WBC Count: 2.5 10*3/uL — ABNORMAL LOW (ref 4.0–10.5)
nRBC: 0 % (ref 0.0–0.2)

## 2020-02-26 LAB — TOTAL PROTEIN, URINE DIPSTICK: Protein, ur: 100 mg/dL — AB

## 2020-02-26 MED ORDER — SODIUM CHLORIDE 0.9 % IV SOLN
10.0000 mg/kg | Freq: Once | INTRAVENOUS | Status: AC
  Administered 2020-02-26: 500 mg via INTRAVENOUS
  Filled 2020-02-26: qty 4

## 2020-02-26 MED ORDER — SODIUM CHLORIDE 0.9 % IV SOLN
Freq: Once | INTRAVENOUS | Status: AC
  Filled 2020-02-26: qty 250

## 2020-02-26 MED ORDER — LOMUSTINE 100 MG PO CAPS: 100.0000 mg | ORAL_CAPSULE | Freq: Once | ORAL | 0 refills | Status: DC

## 2020-02-26 MED ORDER — LOMUSTINE 40 MG PO CAPS: 40.0000 mg | ORAL_CAPSULE | Freq: Once | ORAL | 0 refills | Status: DC

## 2020-02-26 NOTE — Patient Instructions (Signed)
[facility] Discharge Instructions for Patients Receiving Chemotherapy  Today you received the following chemotherapy agents: bevacizumab  To help prevent nausea and vomiting after your treatment, we encourage you to take your nausea medication as directed.   If you develop nausea and vomiting that is not controlled by your nausea medication, call the clinic.   BELOW ARE SYMPTOMS THAT SHOULD BE REPORTED IMMEDIATELY:  *FEVER GREATER THAN 100.5 F  *CHILLS WITH OR WITHOUT FEVER  NAUSEA AND VOMITING THAT IS NOT CONTROLLED WITH YOUR NAUSEA MEDICATION  *UNUSUAL SHORTNESS OF BREATH  *UNUSUAL BRUISING OR BLEEDING  TENDERNESS IN MOUTH AND THROAT WITH OR WITHOUT PRESENCE OF ULCERS  *URINARY PROBLEMS  *BOWEL PROBLEMS  UNUSUAL RASH Items with * indicate a potential emergency and should be followed up as soon as possible.  Feel free to call the clinic should you have any questions or concerns. The clinic phone number is [phone].  Please show the CHEMO ALERT CARD at check-in to the Emergency Department and triage nurse.   

## 2020-02-26 NOTE — Progress Notes (Signed)
Per Dr Mickeal Skinner, ok to treat with urine protein of 100.

## 2020-02-26 NOTE — Progress Notes (Signed)
[facility] at [place], [place] [address] [phone]   Interval Evaluation  Date of Service: 02/26/20 Patient Name: Jillian Gomez Patient MRN: [id number] Patient DOB: 1955/09/02 Provider: Ventura Sellers, MD  Identifying Statement:  Jillian Gomez is a 65 y.o. 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 Kathyrn Sheriff.  Path is GBM IDH-wt   01/23/2019 - 03/03/2019 Radiation Therapy   IMRT with concurrent Temozolomide 61m/m2   03/28/2019 - 09/04/2019 Chemotherapy   5 cycles of adjuvant 5-day Temozolomide 2020mm2    09/01/2019 Progression   Progression of disease #1   09/19/2019 -  Chemotherapy   Begins second line therapy with oral CCNU 9053m2 q6 weeks, concurrent Avastin 37m68m IV q2 weeks     Biomarkers:  MGMT Unknown.  IDH 1/2 Wild type.  EGFR Unknown  TERT Unknown   Interval History:  RondTANELLE LANZOsents today for follow up after recent MRI brain.  Denies new or progressive neurologic symptoms.  No recurrence in abdominal symptoms.  Continues to have deficits with reading and right sided vision, no clear change from prior.  No right sided weakness. No seizures or headaches.  H+P (01/09/19) Patient presented to medical attention in early December with ~2 months history of right sided visual impairment.  She describes fuzzy or blurry vision on that side which was progressive over time, leading to at least one fall.  MRI brain demonstrated enhancing left occipital mass; this was subsequently resected by Dr. NundKathyrn Sheriff12/18/20.  She had no issues with surgery and has completed her steroid taper.  Continues on keppra daily but no history of any seizure. She has returned to work with only modest difficutly.  No issues walking or performing ADLs; lives alone but  her sister and other family members are nearby.  Medications: Current Outpatient Medications on File Prior to Visit  Medication Sig Dispense Refill  . atorvastatin (LIPITOR) 40 MG tablet Take 20 mg by mouth daily. (Patient not taking: Reported on 01/09/2020)    . Biotin 1 MG CAPS Take 1 mg by mouth daily.    . CAMarland KitchenCIUM PO Take 1 tablet by mouth daily.    . doMarland Kitchenycycline (VIBRA-TABS) 100 MG tablet Take 1 tablet by mouth daily.    . EUArna MediciMCG tablet Take 50 mcg by mouth daily.    . naproxen sodium (ALEVE) 220 MG tablet Take 1 tablet (220 mg total) by mouth daily as needed (pain).    . omMarland Kitchenprazole (PRILOSEC) 20 MG capsule Take 1 capsule (20 mg total) by mouth daily. 30 capsule 3  . ondansetron (ZOFRAN) 8 MG tablet Take 1 tablet (8 mg total) by mouth 2 (two) times daily as needed. Start on the third day after chemotherapy. 30 tablet 1  . potassium chloride SA (KLOR-CON) 10 MEQ tablet Take 1 tablet (10 mEq total) by mouth daily. (Patient not taking: No sig reported) 30 tablet 3   No current facility-administered medications on file prior to visit.    Allergies:  Allergies  Allergen Reactions  . Etodolac Other (See Comments)    GI upset  . Macrobid [Nitrofurantoin] Rash   Past Medical History:  Past Medical History:  Diagnosis Date  . High cholesterol   . Hypothyroidism   . Joint pain    Past Surgical History:  Past Surgical History:  Procedure Laterality Date  . APPLICATION OF CRANIAL NAVIGATION Left 12/23/2018   Procedure: APPLICATION OF CRANIAL NAVIGATION;  Surgeon: Consuella Lose, MD;  Location: [facility];  Service: Neurosurgery;  Laterality: Left;  APPLICATION OF CRANIAL NAVIGATION  . BREAST BIOPSY Left   . COLONOSCOPY    . CRANIOTOMY Left 12/23/2018   Procedure: STEREOTACTIC LEFT OCCIPITAL CRANIOTOMY FOR RESECTION OF TUMOR;  Surgeon: Consuella Lose, MD;  Location: [facility];  Service: Neurosurgery;  Laterality: Left;  STEREOTACTIC LEFT OCCIPITAL CRANIOTOMY FOR RESECTION OF  TUMOR  . LASIK     Social History:  Social History   Socioeconomic History  . Marital status: Single    Spouse name: Not on file  . Number of children: Not on file  . Years of education: Not on file  . Highest education level: Not on file  Occupational History  . Not on file  Tobacco Use  . Smoking status: Never Smoker  . Smokeless tobacco: Never Used  Vaping Use  . Vaping Use: Never used  Substance and Sexual Activity  . Alcohol use: Not Currently  . Drug use: Never  . Sexual activity: Not on file  Other Topics Concern  . Not on file  Social History Narrative  . Not on file   Social Determinants of Health   Financial Resource Strain: Not on file  Food Insecurity: Not on file  Transportation Needs: Not on file  Physical Activity: Not on file  Stress: Not on file  Social Connections: Not on file  Intimate Partner Violence: Not on file   Family History: No family history on file.  Review of Systems: Constitutional: Denies fevers, chills or abnormal weight loss Eyes: Denies blurriness of vision Gastrointestinal:  Denies nausea, constipation, diarrhea GU: Denies dysuria or incontinence Skin: Denies abnormal skin rashes Musculoskeletal: Denies joint pain, back or neck discomfort.  Behavioral/Psych: Denies anxiety, mood instability  Physical Exam: Vitals:   02/26/20 0940  BP: 134/77  Pulse: 97  Resp: 18  Temp: (!) 97.4 F (36.3 C)  SpO2: 100%   KPS: 80. General: Alert, cooperative, pleasant, in no acute distress Head: Craniotomy scar noted, dry and intact. EENT: No conjunctival injection or scleral icterus. Oral mucosa moist Lungs: Resp effort normal Cardiac: Regular rate and rhythm Abdomen: Soft, non-distended abdomen Skin: No rashes cyanosis or petechiae. Extremities: No clubbing or edema  Neurologic Exam: Mental Status: Awake, alert, attentive to examiner. Oriented to self and environment. Language is notable for mild transcortical expressive  dyshpasia. Cranial Nerves: Visual acuity is grossly normal. Right homonymous hemianopia. Extra-ocular movements intact. No ptosis. Face is symmetric, tongue midline. Motor: Tone and bulk are normal. Power is full in both arms and legs. Reflexes are symmetric, no pathologic reflexes present. Intact finger to nose bilaterally Sensory: Intact to light touch and temperature Gait: Normal and tandem gait is deferred.   Labs: I have reviewed the data as listed    Component Value Date/Time   NA 141 02/13/2020 1138   K 3.9 02/13/2020 1138   CL 110 02/13/2020 1138   CO2 21 (L) 02/13/2020 1138   GLUCOSE 96 02/13/2020 1138   BUN 22 02/13/2020 1138   CREATININE 1.00 02/13/2020 1138   CALCIUM 9.6 02/13/2020 1138   PROT 6.7 02/13/2020 1138   ALBUMIN 3.7 02/13/2020 1138   AST 21 02/13/2020 1138   ALT 13 02/13/2020 1138   ALKPHOS 77 02/13/2020 1138   BILITOT 0.6 02/13/2020 1138   GFRNONAA >60 02/13/2020 1138   GFRAA >60 10/03/2019 1133  Lab Results  Component Value Date   WBC 2.5 (L) 02/26/2020   NEUTROABS 1.0 (L) 02/26/2020   HGB 12.2 02/26/2020   HCT 36.3 02/26/2020   MCV 96.5 02/26/2020   PLT 88 (L) 02/26/2020   Imaging:  [facility] Clinician Interpretation: I have personally reviewed the CNS images as listed.  My interpretation, in the context of the patient's clinical presentation, is stable disease  MR BRAIN W WO CONTRAST  Result Date: 02/24/2020 CLINICAL DATA:  Follow-up left parieto-occipital glioblastoma. Previous resection and radiation. EXAM: MRI HEAD WITHOUT AND WITH CONTRAST TECHNIQUE: Multiplanar, multiecho pulse sequences of the brain and surrounding structures were obtained without and with intravenous contrast. CONTRAST:  86m GADAVIST GADOBUTROL 1 MMOL/ML IV SOLN COMPARISON:  01/01/2020 and multiple previous FINDINGS: Brain: The only change since the previous study is slightly more extensive FLAIR and T2 signal within the white matter inferior and superior to the lesion. Actual  treated lesion itself does not show a changes in size. Post resection space is similar as well. No change in mass effect. No significant change in the enhancement pattern or pattern of restricted diffusion. No evidence of new satellite lesion. The brain is otherwise unremarkable. No stroke, hydrocephalus or extra-axial collection Vascular: Major vessels at the base of the brain show flow. Skull and upper cervical spine: Negative Sinuses/Orbits: Clear/normal Other: None IMPRESSION: Slight increased FLAIR and T2 signal within the white matter inferior and superior to the treated lesion. No evidence of increasing mass effect, restricted diffusion or progressive enhancement. The significance of this adjacent white matter signal is doubtful. I think the examination is stable with respect to the significant findings since December. Electronically Signed   By: MNelson ChimesM.D.   On: 02/24/2020 19:57   Assessment/Plan Glioblastoma with isocitrate dehydrogenase gene wildtype ([place] [C71.9]   RAREEBA SULSERis clinically stable today.  MRI demonstrates stable post-contrast series, with very subtle changing involving T2 signal regions surrounding resection cavity.  These changes are of unclear significance and can be followed radiographically.  Labs continue to demonstrate thrombocytopenia, 88k today. Although we will recommend proceeding with cycle #4 oral CCNU 987mm2, we will defer next cycle until next set of labs in 2 weeks.  We will also add supportive care plan with neulasta 24h post dosing of CCNU.   She is safe, however, to dose Avastin today 1045mg IV q2 weeks.  Chemotherapy should be held for the following:  ANC less than 1,000  Platelets less than 100,000  LFT or creatinine greater than 2x ULN  If clinical concerns/contraindications develop  Avastin should be held for the following:  ANC less than 500  Platelets less than 50,000  LFT or creatinine greater than 2x ULN  If clinical  concerns/contraindications develop  We ask that RonKORIANNA WASHERturn to clinic in 2 weeks with labs for avastin and consideration of 4th cycle of CCNU+avastin.  All questions were answered. The patient knows to call the clinic with any problems, questions or concerns. No barriers to learning were detected.  I have spent a total of 40 minutes of face-to-face and non-face-to-face time, excluding clinical staff time, preparing to see patient, ordering tests and/or medications, counseling the patient, and independently interpreting results and communicating results to the patient/family/caregiver    ZacVentura SellersD Medical Director of Neuro-Oncology [facility] Wes[place]gett/21/22 9:40 AM

## 2020-02-26 NOTE — Telephone Encounter (Signed)
Scheduled follow-up appointments per 2/21 los. Patient's sister is aware. °

## 2020-02-26 NOTE — Telephone Encounter (Signed)
Scheduled follow-up appointments per 2/21 los. Patient's sister is aware.

## 2020-02-27 ENCOUNTER — Telehealth: Payer: Self-pay | Admitting: Internal Medicine

## 2020-02-27 ENCOUNTER — Ambulatory Visit: Payer: BC Managed Care – PPO

## 2020-02-27 NOTE — Telephone Encounter (Signed)
Scheduled per 02/21 los, patient has been called and notified. °

## 2020-03-01 MED FILL — GLEOSTINE 100 MG CAPSULE: 100 | 28 days supply | Qty: 1 | Fill #0

## 2020-03-01 MED FILL — GLEOSTINE 40 MG CAPSULE: 40 | 28 days supply | Qty: 1 | Fill #0

## 2020-03-06 ENCOUNTER — Ambulatory Visit
Admission: RE | Admit: 2020-03-06 | Discharge: 2020-03-06 | Disposition: A | Discharge status: Home / Self Care | Payer: BC Managed Care – PPO | Source: Ambulatory Visit | Attending: Obstetrics and Gynecology | Admitting: Obstetrics and Gynecology

## 2020-03-06 ENCOUNTER — Other Ambulatory Visit: Payer: Self-pay

## 2020-03-06 DIAGNOSIS — Z1231 Encounter for screening mammogram for malignant neoplasm of breast: Secondary | ICD-10-CM

## 2020-03-06 IMAGING — MG MM DIGITAL SCREENING BILAT W/ TOMO AND CAD
6 of 10 series · 6 of 30 positions shown · non-contrast
Comparison: Previous exam(s).

CLINICAL DATA: Screening.

EXAM:
DIGITAL SCREENING BILATERAL MAMMOGRAM WITH TOMOSYNTHESIS AND CAD
TECHNIQUE: Bilateral screening digital craniocaudal and mediolateral oblique
mammograms were obtained. Bilateral screening digital breast
tomosynthesis was performed. The images were evaluated with
computer-aided detection.

[L CC synth-2D]
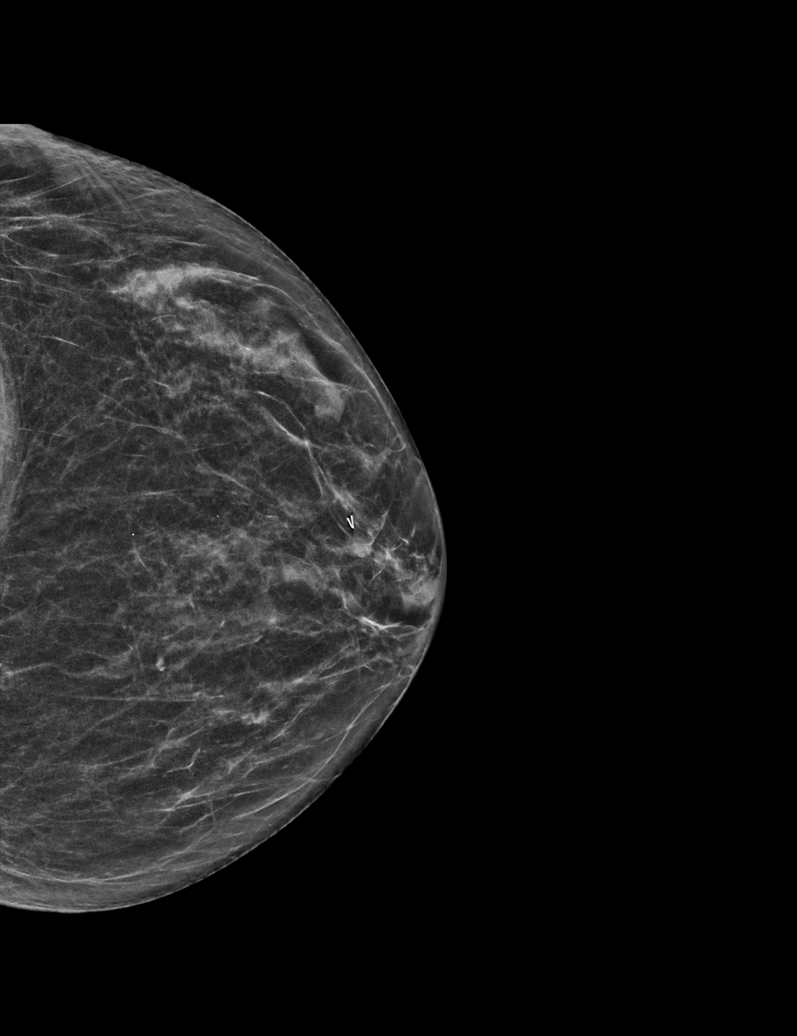

[R MLO synth-2D (1 of 2)]
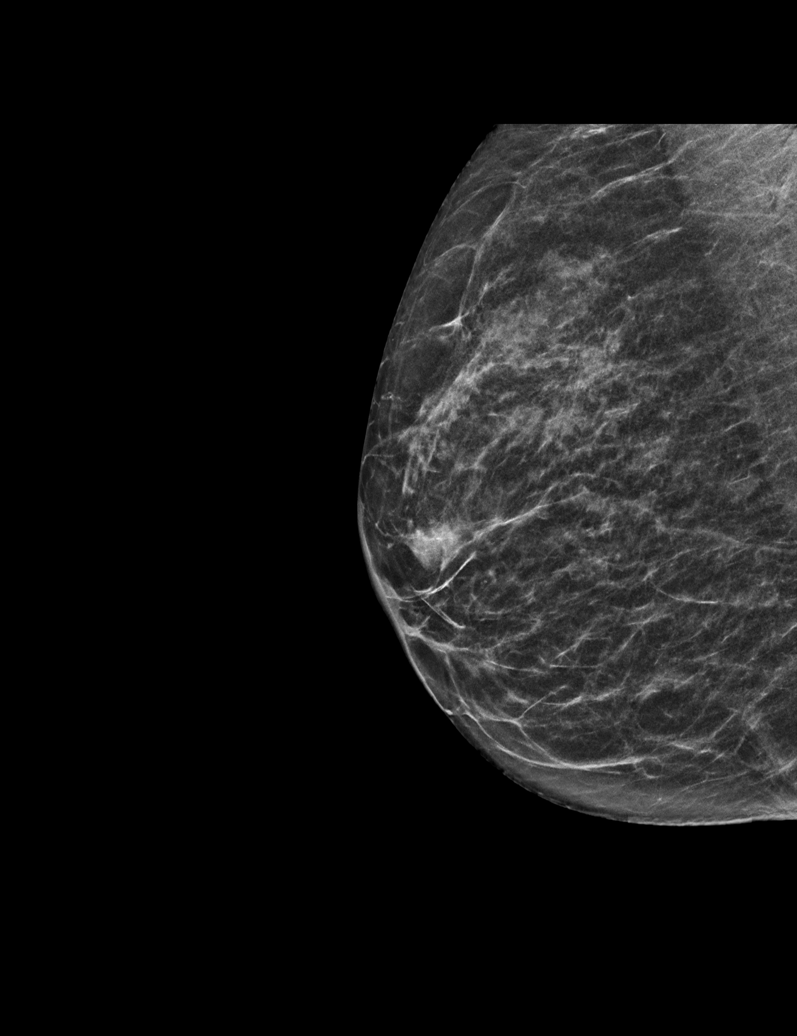

[L MLO synth-2D]
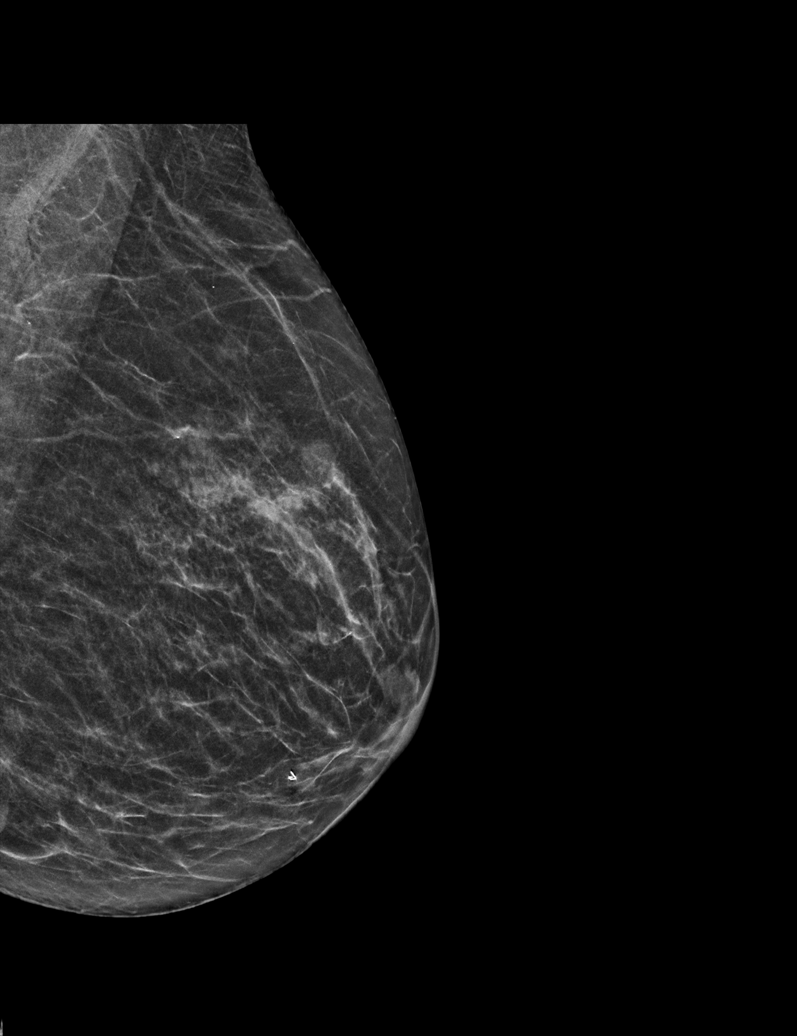

[R CC synth-2D]
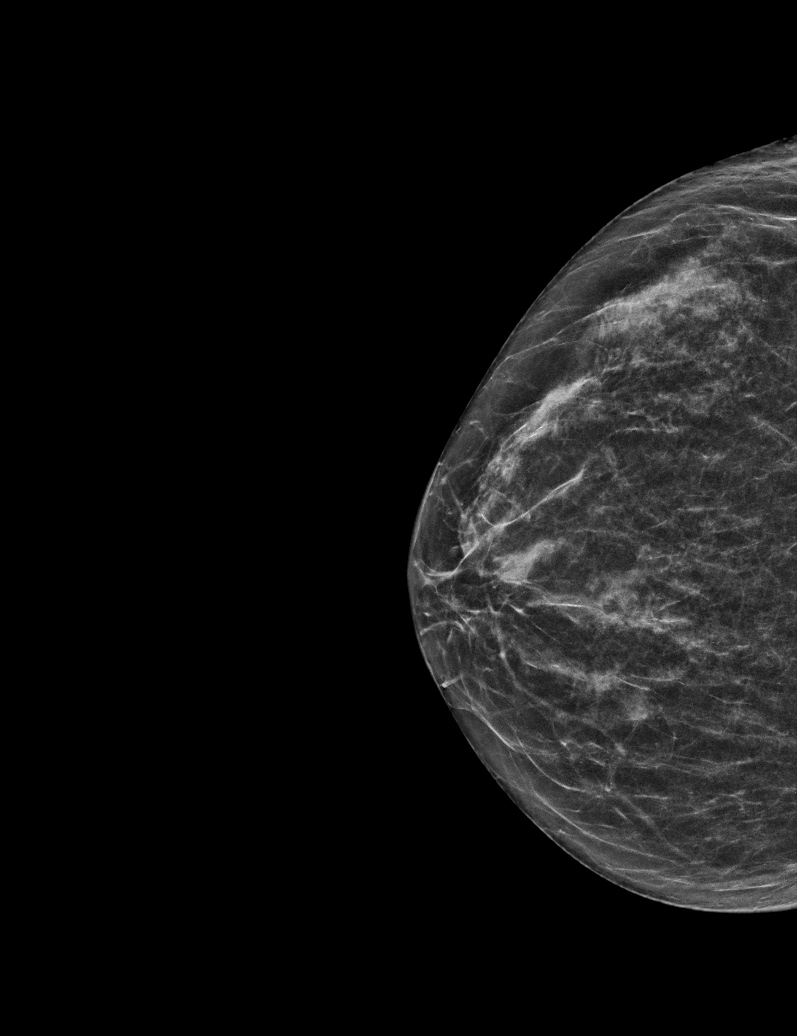

[R MLO synth-2D (2 of 2)]
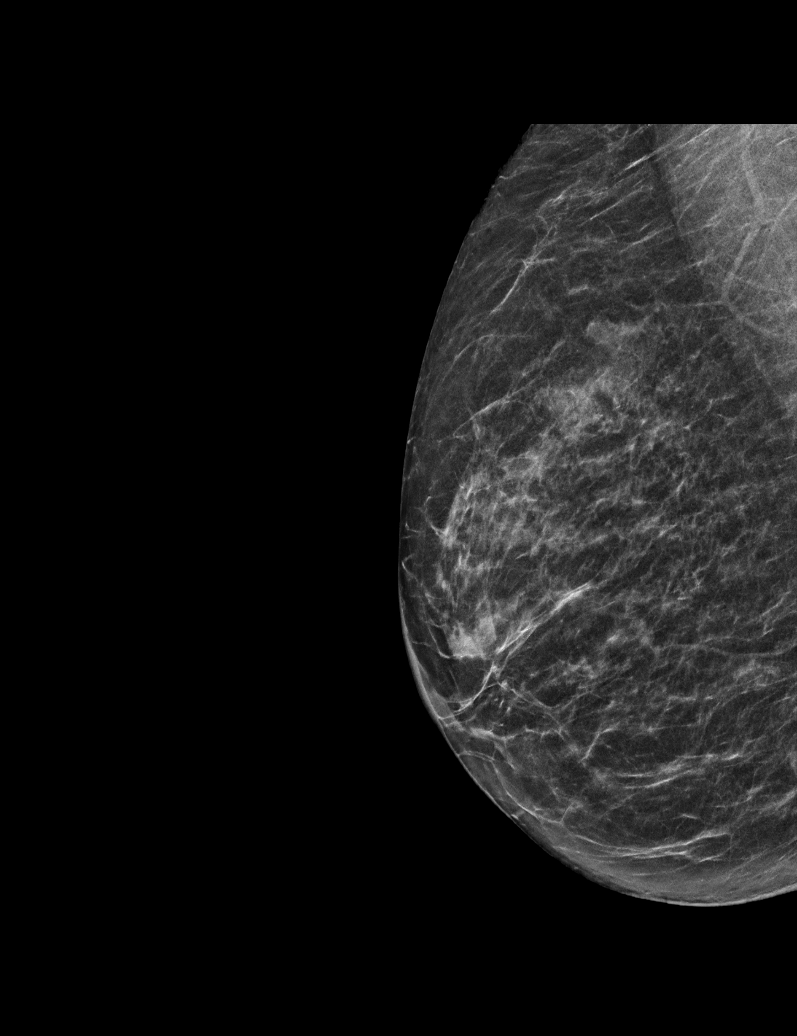

[L CC tomo · tomo slice 24/47.0]
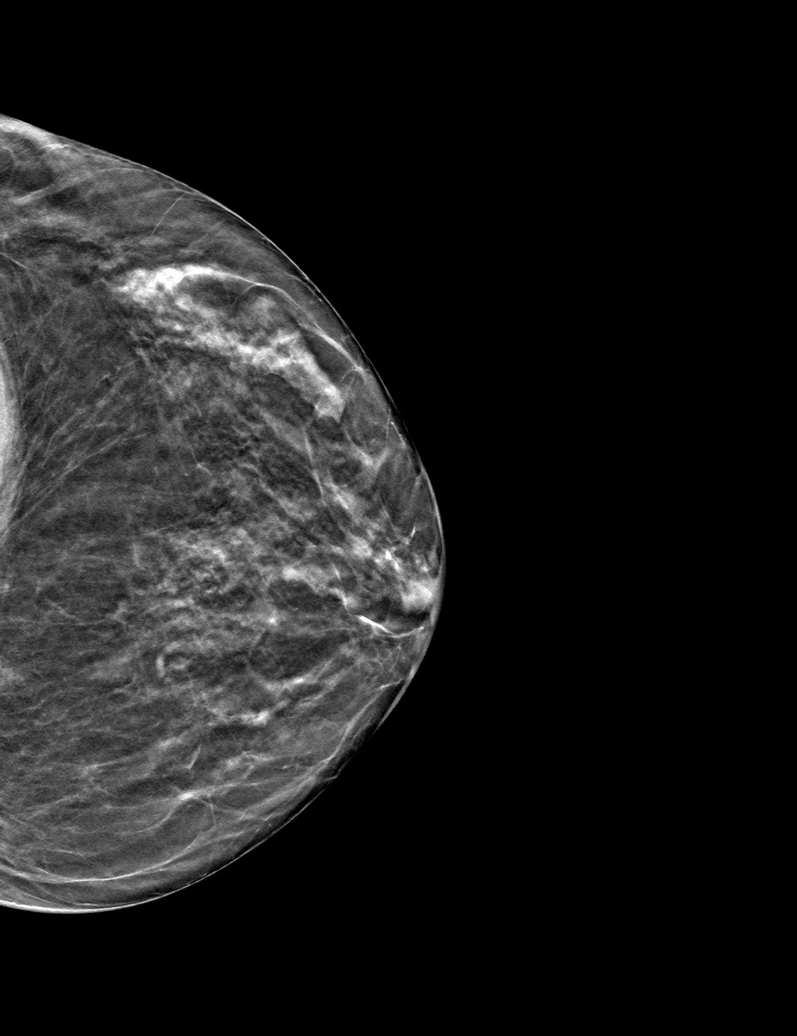

[6 of 30 positions shown; findings below may reference images not displayed]

ACR Breast Density Category b: There are scattered areas of
fibroglandular density.
FINDINGS: There are no findings suspicious for malignancy. The images were
evaluated with computer-aided detection.
IMPRESSION: No mammographic evidence of malignancy. A result letter of this
screening mammogram will be mailed directly to the patient.

RECOMMENDATION:
Screening mammogram in one year. (Code:[OD])

BI-RADS CATEGORY  1: Negative.

## 2020-03-12 ENCOUNTER — Inpatient Hospital Stay: Payer: BC Managed Care – PPO | Attending: Internal Medicine

## 2020-03-12 ENCOUNTER — Inpatient Hospital Stay: Payer: BC Managed Care – PPO

## 2020-03-12 ENCOUNTER — Inpatient Hospital Stay: Payer: BC Managed Care – PPO | Admitting: Internal Medicine

## 2020-03-12 ENCOUNTER — Other Ambulatory Visit: Payer: Self-pay

## 2020-03-12 VITALS — BP 147/78 | HR 88 | Temp 97.0°F | Resp 17 | Ht 62.0 in | Wt 102.8 lb

## 2020-03-12 DIAGNOSIS — C719 Malignant neoplasm of brain, unspecified: Secondary | ICD-10-CM

## 2020-03-12 DIAGNOSIS — D701 Agranulocytosis secondary to cancer chemotherapy: Secondary | ICD-10-CM | POA: Insufficient documentation

## 2020-03-12 DIAGNOSIS — T451X5A Adverse effect of antineoplastic and immunosuppressive drugs, initial encounter: Secondary | ICD-10-CM | POA: Insufficient documentation

## 2020-03-12 DIAGNOSIS — Z9221 Personal history of antineoplastic chemotherapy: Secondary | ICD-10-CM | POA: Insufficient documentation

## 2020-03-12 DIAGNOSIS — C714 Malignant neoplasm of occipital lobe: Secondary | ICD-10-CM | POA: Diagnosis not present

## 2020-03-12 DIAGNOSIS — D6959 Other secondary thrombocytopenia: Secondary | ICD-10-CM | POA: Diagnosis not present

## 2020-03-12 DIAGNOSIS — Z923 Personal history of irradiation: Secondary | ICD-10-CM | POA: Diagnosis not present

## 2020-03-12 DIAGNOSIS — Z5189 Encounter for other specified aftercare: Secondary | ICD-10-CM | POA: Diagnosis not present

## 2020-03-12 DIAGNOSIS — Z5112 Encounter for antineoplastic immunotherapy: Secondary | ICD-10-CM | POA: Insufficient documentation

## 2020-03-12 LAB — CMP (CANCER CENTER ONLY)
ALT: 26 U/L (ref 0–44)
AST: 31 U/L (ref 15–41)
Albumin: 3.9 g/dL (ref 3.5–5.0)
Alkaline Phosphatase: 89 U/L (ref 38–126)
Anion gap: 8 (ref 5–15)
BUN: 15 mg/dL (ref 8–23)
CO2: 23 mmol/L (ref 22–32)
Calcium: 9.6 mg/dL (ref 8.9–10.3)
Chloride: 108 mmol/L (ref 98–111)
Creatinine: 0.92 mg/dL (ref 0.44–1.00)
GFR, Estimated: >60 mL/min (ref >60)
Glucose, Bld: 85 mg/dL (ref 70–99)
Potassium: 4 mmol/L (ref 3.5–5.1)
Sodium: 139 mmol/L (ref 135–145)
Total Bilirubin: 0.3 mg/dL (ref 0.3–1.2)
Total Protein: 7.1 g/dL (ref 6.5–8.1)

## 2020-03-12 LAB — CBC WITH DIFFERENTIAL (CANCER CENTER ONLY)
Abs Immature Granulocytes: 0.01 10*3/uL (ref 0.00–0.07)
Basophils Absolute: 0 10*3/uL (ref 0.0–0.1)
Basophils Relative: 1 %
Eosinophils Absolute: 0 10*3/uL (ref 0.0–0.5)
Eosinophils Relative: 1 %
HCT: 41.6 % (ref 36.0–46.0)
Hemoglobin: 13.7 g/dL (ref 12.0–15.0)
Immature Granulocytes: 0 %
Lymphocytes Relative: 27 %
Lymphs Abs: 1.3 10*3/uL (ref 0.7–4.0)
MCH: 32.6 pg (ref 26.0–34.0)
MCHC: 32.9 g/dL (ref 30.0–36.0)
MCV: 99 fL (ref 80.0–100.0)
Monocytes Absolute: 0.6 10*3/uL (ref 0.1–1.0)
Monocytes Relative: 12 %
Neutro Abs: 2.7 10*3/uL (ref 1.7–7.7)
Neutrophils Relative %: 59 %
Platelet Count: 242 10*3/uL (ref 150–400)
RBC: 4.2 MIL/uL (ref 3.87–5.11)
RDW: 14.6 % (ref 11.5–15.5)
WBC Count: 4.7 10*3/uL (ref 4.0–10.5)
nRBC: 0 % (ref 0.0–0.2)

## 2020-03-12 LAB — TOTAL PROTEIN, URINE DIPSTICK: Protein, ur: 30 mg/dL — AB

## 2020-03-12 MED ORDER — SODIUM CHLORIDE 0.9 % IV SOLN
Freq: Once | INTRAVENOUS | Status: AC
  Filled 2020-03-12: qty 250

## 2020-03-12 MED ORDER — SODIUM CHLORIDE 0.9 % IV SOLN
10.0000 mg/kg | Freq: Once | INTRAVENOUS | Status: AC
  Administered 2020-03-12: 500 mg via INTRAVENOUS
  Filled 2020-03-12: qty 16

## 2020-03-12 NOTE — Patient Instructions (Signed)
[facility] Discharge Instructions for Patients Receiving Chemotherapy  Today you received the following chemotherapy agents Bevacizumab-BVZR(Ziravbev/Avastin)  To help prevent nausea and vomiting after your treatment, we encourage you to take your nausea medication as directed.   If you develop nausea and vomiting that is not controlled by your nausea medication, call the clinic.   BELOW ARE SYMPTOMS THAT SHOULD BE REPORTED IMMEDIATELY:  *FEVER GREATER THAN 100.5 F  *CHILLS WITH OR WITHOUT FEVER  NAUSEA AND VOMITING THAT IS NOT CONTROLLED WITH YOUR NAUSEA MEDICATION  *UNUSUAL SHORTNESS OF BREATH  *UNUSUAL BRUISING OR BLEEDING  TENDERNESS IN MOUTH AND THROAT WITH OR WITHOUT PRESENCE OF ULCERS  *URINARY PROBLEMS  *BOWEL PROBLEMS  UNUSUAL RASH Items with * indicate a potential emergency and should be followed up as soon as possible.  Feel free to call the clinic should you have any questions or concerns. The clinic phone number is [phone].  Please show the CHEMO ALERT CARD at check-in to the Emergency Department and triage nurse.   

## 2020-03-12 NOTE — Progress Notes (Signed)
[facility] at [place], [place] [address] [phone]   Interval Evaluation  Date of Service: 03/12/20 Patient Name: Jillian Gomez Patient MRN: [id number] Patient DOB: November 26, 1955 Provider: Ventura Sellers, MD  Identifying Statement:  Jillian Gomez is a 65 y.o. 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 Kathyrn Sheriff.  Path is GBM IDH-wt   01/23/2019 - 03/03/2019 Radiation Therapy   IMRT with concurrent Temozolomide 13m/m2   03/28/2019 - 09/04/2019 Chemotherapy   5 cycles of adjuvant 5-day Temozolomide 2058mm2    09/01/2019 Progression   Progression of disease #1   09/19/2019 -  Chemotherapy   Begins second line therapy with oral CCNU 9061m2 q6 weeks, concurrent Avastin 74m63m IV q2 weeks     Biomarkers:  MGMT Unknown.  IDH 1/2 Wild type.  EGFR Unknown  TERT Unknown   Interval History:  Jillian CRENSHAWsents today for avastin infusion. No new or progressive neurologic symptoms today.  No recurrence in abdominal symptoms.  Continues to have deficits with reading and right sided vision, no clear change from prior.  No right sided weakness. No seizures or headaches.  H+P (01/09/19) Patient presented to medical attention in early December with ~2 months history of right sided visual impairment.  She describes fuzzy or blurry vision on that side which was progressive over time, leading to at least one fall.  MRI brain demonstrated enhancing left occipital mass; this was subsequently resected by Dr. NundKathyrn Sheriff12/18/20.  She had no issues with surgery and has completed her steroid taper.  Continues on keppra daily but no history of any seizure. She has returned to work with only modest difficutly.  No issues walking or performing ADLs; lives alone but her sister and  other family members are nearby.  Medications: Current Outpatient Medications on File Prior to Visit  Medication Sig Dispense Refill  . atorvastatin (LIPITOR) 40 MG tablet Take 20 mg by mouth daily. (Patient not taking: Reported on 01/09/2020)    . Biotin 1 MG CAPS Take 1 mg by mouth daily.    . CAMarland KitchenCIUM PO Take 1 tablet by mouth daily.    . doMarland Kitchenycycline (VIBRA-TABS) 100 MG tablet Take 1 tablet by mouth daily.    . EUArna MediciMCG tablet Take 50 mcg by mouth daily.    . naproxen sodium (ALEVE) 220 MG tablet Take 1 tablet (220 mg total) by mouth daily as needed (pain).    . omMarland Kitchenprazole (PRILOSEC) 20 MG capsule Take 1 capsule (20 mg total) by mouth daily. 30 capsule 3  . ondansetron (ZOFRAN) 8 MG tablet Take 1 tablet (8 mg total) by mouth 2 (two) times daily as needed. Start on the third day after chemotherapy. 30 tablet 1  . potassium chloride SA (KLOR-CON) 10 MEQ tablet Take 1 tablet (10 mEq total) by mouth daily. (Patient not taking: No sig reported) 30 tablet 3   No current facility-administered medications on file prior to visit.    Allergies:  Allergies  Allergen Reactions  . Etodolac Other (See Comments)    GI upset  . Macrobid [Nitrofurantoin] Rash   Past Medical History:  Past Medical History:  Diagnosis Date  . High cholesterol   . Hypothyroidism   . Joint pain   . Thrombocytopenia (HCC)[place]21/2022   Past Surgical History:  Past Surgical History:  Procedure Laterality Date  . APPLICATION OF CRANIAL NAVIGATION Left 12/23/2018   Procedure: APPLICATION OF CRANIAL NAVIGATION;  Surgeon: Consuella Lose, MD;  Location: [facility];  Service: Neurosurgery;  Laterality: Left;  APPLICATION OF CRANIAL NAVIGATION  . BREAST BIOPSY Left   . COLONOSCOPY    . CRANIOTOMY Left 12/23/2018   Procedure: STEREOTACTIC LEFT OCCIPITAL CRANIOTOMY FOR RESECTION OF TUMOR;  Surgeon: Consuella Lose, MD;  Location: [facility];  Service: Neurosurgery;  Laterality: Left;  STEREOTACTIC LEFT OCCIPITAL  CRANIOTOMY FOR RESECTION OF TUMOR  . LASIK     Social History:  Social History   Socioeconomic History  . Marital status: Single    Spouse name: Not on file  . Number of children: Not on file  . Years of education: Not on file  . Highest education level: Not on file  Occupational History  . Not on file  Tobacco Use  . Smoking status: Never Smoker  . Smokeless tobacco: Never Used  Vaping Use  . Vaping Use: Never used  Substance and Sexual Activity  . Alcohol use: Not Currently  . Drug use: Never  . Sexual activity: Not on file  Other Topics Concern  . Not on file  Social History Narrative  . Not on file   Social Determinants of Health   Financial Resource Strain: Not on file  Food Insecurity: Not on file  Transportation Needs: Not on file  Physical Activity: Not on file  Stress: Not on file  Social Connections: Not on file  Intimate Partner Violence: Not on file   Family History: No family history on file.  Review of Systems: Constitutional: Denies fevers, chills or abnormal weight loss Eyes: Denies blurriness of vision Gastrointestinal:  Denies nausea, constipation, diarrhea GU: Denies dysuria or incontinence Skin: Denies abnormal skin rashes Musculoskeletal: Denies joint pain, back or neck discomfort.  Behavioral/Psych: Denies anxiety, mood instability  Physical Exam: Vitals:   03/12/20 0945  BP: (!) 147/78  Pulse: 88  Resp: 17  Temp: (!) 97 F (36.1 C)  SpO2: 100%   KPS: 80. General: Alert, cooperative, pleasant, in no acute distress Head: Craniotomy scar noted, dry and intact. EENT: No conjunctival injection or scleral icterus. Oral mucosa moist Lungs: Resp effort normal Cardiac: Regular rate and rhythm Abdomen: Soft, non-distended abdomen Skin: No rashes cyanosis or petechiae. Extremities: No clubbing or edema  Neurologic Exam: Mental Status: Awake, alert, attentive to examiner. Oriented to self and environment. Language is notable for mild  transcortical expressive dyshpasia. Cranial Nerves: Visual acuity is grossly normal. Right homonymous hemianopia. Extra-ocular movements intact. No ptosis. Face is symmetric, tongue midline. Motor: Tone and bulk are normal. Power is full in both arms and legs. Reflexes are symmetric, no pathologic reflexes present. Intact finger to nose bilaterally Sensory: Intact to light touch and temperature Gait: Normal and tandem gait is deferred.   Labs: I have reviewed the data as listed    Component Value Date/Time   NA 139 02/26/2020 0925   K 4.1 02/26/2020 0925   CL 106 02/26/2020 0925   CO2 23 02/26/2020 0925   GLUCOSE 93 02/26/2020 0925   BUN 18 02/26/2020 0925   CREATININE 0.94 02/26/2020 0925   CALCIUM 9.5 02/26/2020 0925   PROT 6.8 02/26/2020 0925   ALBUMIN 3.8 02/26/2020 0925   AST 20 02/26/2020 0925   ALT 12 02/26/2020 0925   ALKPHOS 81 02/26/2020 0925   BILITOT 0.4 02/26/2020 0925   GFRNONAA >60 02/26/2020 0925   GFRAA >60 10/03/2019 1133  Lab Results  Component Value Date   WBC 4.7 03/12/2020   NEUTROABS 2.7 03/12/2020   HGB 13.7 03/12/2020   HCT 41.6 03/12/2020   MCV 99.0 03/12/2020   PLT 242 03/12/2020    Assessment/Plan Glioblastoma with isocitrate dehydrogenase gene wildtype ([place]) [C71.9]   Jillian Gomez is clinically stable today.    Labs demonstrate improvement in thrombocytopenia and neutropenia. We will recommend proceeding with cycle #4 oral CCNU 95m/m2.  We will also add supportive care plan with neulasta 24h post dosing of CCNU (tomorrow).   She is safe to dose Avastin today 145mkg IV q2 weeks.  Chemotherapy should be held for the following:  ANC less than 1,000  Platelets less than 100,000  LFT or creatinine greater than 2x ULN  If clinical concerns/contraindications develop  Avastin should be held for the following:  ANC less than 500  Platelets less than 50,000  LFT or creatinine greater than 2x ULN  If clinical  concerns/contraindications develop  We ask that Jillian Gomez to clinic in 2 weeks with labs for avastin with labs for review.  All questions were answered. The patient knows to call the clinic with any problems, questions or concerns. No barriers to learning were detected.  I have spent a total of 30 minutes of face-to-face and non-face-to-face time, excluding clinical staff time, preparing to see patient, ordering tests and/or medications, counseling the patient, and independently interpreting results and communicating results to the patient/family/caregiver    ZaVentura SellersMD Medical Director of Neuro-Oncology [facility] We[place]3/08/22 9:46 AM

## 2020-03-13 ENCOUNTER — Ambulatory Visit: Payer: BC Managed Care – PPO

## 2020-03-13 ENCOUNTER — Inpatient Hospital Stay: Payer: BC Managed Care – PPO

## 2020-03-13 ENCOUNTER — Other Ambulatory Visit: Payer: Self-pay

## 2020-03-14 ENCOUNTER — Inpatient Hospital Stay: Payer: BC Managed Care – PPO

## 2020-03-14 ENCOUNTER — Other Ambulatory Visit: Payer: Self-pay

## 2020-03-14 VITALS — BP 133/70 | HR 84 | Temp 98.3°F | Resp 18

## 2020-03-14 DIAGNOSIS — D701 Agranulocytosis secondary to cancer chemotherapy: Secondary | ICD-10-CM

## 2020-03-14 DIAGNOSIS — D696 Thrombocytopenia, unspecified: Secondary | ICD-10-CM

## 2020-03-14 DIAGNOSIS — C714 Malignant neoplasm of occipital lobe: Secondary | ICD-10-CM | POA: Diagnosis not present

## 2020-03-14 DIAGNOSIS — Z923 Personal history of irradiation: Secondary | ICD-10-CM | POA: Diagnosis not present

## 2020-03-14 DIAGNOSIS — D6959 Other secondary thrombocytopenia: Secondary | ICD-10-CM | POA: Diagnosis not present

## 2020-03-14 DIAGNOSIS — Z5189 Encounter for other specified aftercare: Secondary | ICD-10-CM | POA: Diagnosis not present

## 2020-03-14 DIAGNOSIS — T451X5A Adverse effect of antineoplastic and immunosuppressive drugs, initial encounter: Secondary | ICD-10-CM

## 2020-03-14 DIAGNOSIS — Z9221 Personal history of antineoplastic chemotherapy: Secondary | ICD-10-CM | POA: Diagnosis not present

## 2020-03-14 DIAGNOSIS — Z5112 Encounter for antineoplastic immunotherapy: Secondary | ICD-10-CM | POA: Diagnosis not present

## 2020-03-14 MED ORDER — PEGFILGRASTIM-CBQV 6 MG/0.6ML ~~LOC~~ SOSY
PREFILLED_SYRINGE | SUBCUTANEOUS | Status: AC
  Filled 2020-03-14: qty 0.6

## 2020-03-14 MED ORDER — PEGFILGRASTIM-CBQV 6 MG/0.6ML ~~LOC~~ SOSY
6.0000 mg | PREFILLED_SYRINGE | Freq: Once | SUBCUTANEOUS | Status: AC
  Administered 2020-03-14: 6 mg via SUBCUTANEOUS

## 2020-03-14 NOTE — Patient Instructions (Signed)
Pegfilgrastim injection What is this medicine? PEGFILGRASTIM (PEG fil gra stim) is a long-acting granulocyte colony-stimulating factor that stimulates the growth of neutrophils, a type of white blood cell important in the body's fight against infection. It is used to reduce the incidence of fever and infection in patients with certain types of cancer who are receiving chemotherapy that affects the bone marrow, and to increase survival after being exposed to high doses of radiation. This medicine may be used for other purposes; ask your health care provider or pharmacist if you have questions. COMMON BRAND NAME(S): Fulphila, Neulasta, Nyvepria, UDENYCA, Ziextenzo What should I tell my health care provider before I take this medicine? They need to know if you have any of these conditions:  kidney disease  latex allergy  ongoing radiation therapy  sickle cell disease  skin reactions to acrylic adhesives (On-Body Injector only)  an unusual or allergic reaction to pegfilgrastim, filgrastim, other medicines, foods, dyes, or preservatives  pregnant or trying to get pregnant  breast-feeding How should I use this medicine? This medicine is for injection under the skin. If you get this medicine at home, you will be taught how to prepare and give the pre-filled syringe or how to use the On-body Injector. Refer to the patient Instructions for Use for detailed instructions. Use exactly as directed. Tell your healthcare provider immediately if you suspect that the On-body Injector may not have performed as intended or if you suspect the use of the On-body Injector resulted in a missed or partial dose. It is important that you put your used needles and syringes in a special sharps container. Do not put them in a trash can. If you do not have a sharps container, call your pharmacist or healthcare provider to get one. Talk to your pediatrician regarding the use of this medicine in children. While this drug  may be prescribed for selected conditions, precautions do apply. Overdosage: If you think you have taken too much of this medicine contact a poison control center or emergency room at once. NOTE: This medicine is only for you. Do not share this medicine with others. What if I miss a dose? It is important not to miss your dose. Call your doctor or health care professional if you miss your dose. If you miss a dose due to an On-body Injector failure or leakage, a new dose should be administered as soon as possible using a single prefilled syringe for manual use. What may interact with this medicine? Interactions have not been studied. This list may not describe all possible interactions. Give your health care provider a list of all the medicines, herbs, non-prescription drugs, or dietary supplements you use. Also tell them if you smoke, drink alcohol, or use illegal drugs. Some items may interact with your medicine. What should I watch for while using this medicine? Your condition will be monitored carefully while you are receiving this medicine. You may need blood work done while you are taking this medicine. Talk to your health care provider about your risk of cancer. You may be more at risk for certain types of cancer if you take this medicine. If you are going to need a MRI, CT scan, or other procedure, tell your doctor that you are using this medicine (On-Body Injector only). What side effects may I notice from receiving this medicine? Side effects that you should report to your doctor or health care professional as soon as possible:  allergic reactions (skin rash, itching or hives, swelling of   the face, lips, or tongue)  back pain  dizziness  fever  pain, redness, or irritation at site where injected  pinpoint red spots on the skin  red or dark-brown urine  shortness of breath or breathing problems  stomach or side pain, or pain at the shoulder  swelling  tiredness  trouble  passing urine or change in the amount of urine  unusual bruising or bleeding Side effects that usually do not require medical attention (report to your doctor or health care professional if they continue or are bothersome):  bone pain  muscle pain This list may not describe all possible side effects. Call your doctor for medical advice about side effects. You may report side effects to FDA at 1-800-FDA-1088. Where should I keep my medicine? Keep out of the reach of children. If you are using this medicine at home, you will be instructed on how to store it. Throw away any unused medicine after the expiration date on the label. NOTE: This sheet is a summary. It may not cover all possible information. If you have questions about this medicine, talk to your doctor, pharmacist, or health care provider.  2021 Elsevier/Gold Standard (2019-01-13 13:20:51)  

## 2020-03-22 ENCOUNTER — Telehealth: Payer: Self-pay | Admitting: Internal Medicine

## 2020-03-22 NOTE — Telephone Encounter (Signed)
Scheduled apt per MD request. Provider not in office on 3/22 rescheduled to 3/24. Left message for patient with new appt date and time

## 2020-03-26 ENCOUNTER — Inpatient Hospital Stay: Payer: BC Managed Care – PPO | Admitting: Internal Medicine

## 2020-03-26 ENCOUNTER — Inpatient Hospital Stay: Payer: BC Managed Care – PPO

## 2020-03-26 ENCOUNTER — Telehealth: Payer: Self-pay | Admitting: Internal Medicine

## 2020-03-26 NOTE — Telephone Encounter (Signed)
Rescheduled 03/24 appointment to 03/25 per patient's request. Patient received calender and should be notified of next appointment.

## 2020-03-28 ENCOUNTER — Inpatient Hospital Stay: Payer: BC Managed Care – PPO | Admitting: Internal Medicine

## 2020-03-28 ENCOUNTER — Inpatient Hospital Stay: Payer: BC Managed Care – PPO

## 2020-03-28 DIAGNOSIS — H53461 Homonymous bilateral field defects, right side: Secondary | ICD-10-CM | POA: Diagnosis not present

## 2020-03-28 DIAGNOSIS — H52202 Unspecified astigmatism, left eye: Secondary | ICD-10-CM | POA: Diagnosis not present

## 2020-03-28 DIAGNOSIS — H04123 Dry eye syndrome of bilateral lacrimal glands: Secondary | ICD-10-CM | POA: Diagnosis not present

## 2020-03-28 DIAGNOSIS — H25813 Combined forms of age-related cataract, bilateral: Secondary | ICD-10-CM | POA: Diagnosis not present

## 2020-03-28 DIAGNOSIS — C719 Malignant neoplasm of brain, unspecified: Secondary | ICD-10-CM | POA: Diagnosis not present

## 2020-03-28 DIAGNOSIS — H524 Presbyopia: Secondary | ICD-10-CM | POA: Diagnosis not present

## 2020-03-28 DIAGNOSIS — H5203 Hypermetropia, bilateral: Secondary | ICD-10-CM | POA: Diagnosis not present

## 2020-03-29 ENCOUNTER — Inpatient Hospital Stay: Payer: BC Managed Care – PPO | Admitting: Internal Medicine

## 2020-03-29 ENCOUNTER — Inpatient Hospital Stay: Payer: BC Managed Care – PPO

## 2020-03-29 ENCOUNTER — Other Ambulatory Visit: Payer: Self-pay

## 2020-03-29 VITALS — BP 137/66

## 2020-03-29 VITALS — BP 142/88 | HR 100 | Temp 97.4°F | Resp 17 | Ht 62.0 in | Wt 102.1 lb

## 2020-03-29 DIAGNOSIS — C719 Malignant neoplasm of brain, unspecified: Secondary | ICD-10-CM | POA: Diagnosis not present

## 2020-03-29 DIAGNOSIS — Z923 Personal history of irradiation: Secondary | ICD-10-CM | POA: Diagnosis not present

## 2020-03-29 DIAGNOSIS — T451X5A Adverse effect of antineoplastic and immunosuppressive drugs, initial encounter: Secondary | ICD-10-CM | POA: Diagnosis not present

## 2020-03-29 DIAGNOSIS — C714 Malignant neoplasm of occipital lobe: Secondary | ICD-10-CM | POA: Diagnosis not present

## 2020-03-29 DIAGNOSIS — Z5189 Encounter for other specified aftercare: Secondary | ICD-10-CM | POA: Diagnosis not present

## 2020-03-29 DIAGNOSIS — D6959 Other secondary thrombocytopenia: Secondary | ICD-10-CM | POA: Diagnosis not present

## 2020-03-29 DIAGNOSIS — Z9221 Personal history of antineoplastic chemotherapy: Secondary | ICD-10-CM | POA: Diagnosis not present

## 2020-03-29 DIAGNOSIS — D701 Agranulocytosis secondary to cancer chemotherapy: Secondary | ICD-10-CM | POA: Diagnosis not present

## 2020-03-29 DIAGNOSIS — Z5112 Encounter for antineoplastic immunotherapy: Secondary | ICD-10-CM | POA: Diagnosis not present

## 2020-03-29 LAB — CBC WITH DIFFERENTIAL (CANCER CENTER ONLY)
Abs Immature Granulocytes: 0.05 10*3/uL (ref 0.00–0.07)
Basophils Absolute: 0.1 10*3/uL (ref 0.0–0.1)
Basophils Relative: 0 %
Eosinophils Absolute: 0.1 10*3/uL (ref 0.0–0.5)
Eosinophils Relative: 1 %
HCT: 38.8 % (ref 36.0–46.0)
Hemoglobin: 13 g/dL (ref 12.0–15.0)
Immature Granulocytes: 0 %
Lymphocytes Relative: 10 %
Lymphs Abs: 1.3 10*3/uL (ref 0.7–4.0)
MCH: 32.8 pg (ref 26.0–34.0)
MCHC: 33.5 g/dL (ref 30.0–36.0)
MCV: 98 fL (ref 80.0–100.0)
Monocytes Absolute: 1 10*3/uL (ref 0.1–1.0)
Monocytes Relative: 8 %
Neutro Abs: 10.5 10*3/uL — ABNORMAL HIGH (ref 1.7–7.7)
Neutrophils Relative %: 81 %
Platelet Count: 159 10*3/uL (ref 150–400)
RBC: 3.96 MIL/uL (ref 3.87–5.11)
RDW: 14.9 % (ref 11.5–15.5)
WBC Count: 12.8 10*3/uL — ABNORMAL HIGH (ref 4.0–10.5)
nRBC: 0 % (ref 0.0–0.2)

## 2020-03-29 LAB — CMP (CANCER CENTER ONLY)
ALT: 53 U/L — ABNORMAL HIGH (ref 0–44)
AST: 52 U/L — ABNORMAL HIGH (ref 15–41)
Albumin: 3.7 g/dL (ref 3.5–5.0)
Alkaline Phosphatase: 165 U/L — ABNORMAL HIGH (ref 38–126)
Anion gap: 13 (ref 5–15)
BUN: 19 mg/dL (ref 8–23)
CO2: 22 mmol/L (ref 22–32)
Calcium: 9.4 mg/dL (ref 8.9–10.3)
Chloride: 106 mmol/L (ref 98–111)
Creatinine: 0.84 mg/dL (ref 0.44–1.00)
GFR, Estimated: >60 mL/min (ref >60)
Glucose, Bld: 89 mg/dL (ref 70–99)
Potassium: 4.3 mmol/L (ref 3.5–5.1)
Sodium: 141 mmol/L (ref 135–145)
Total Bilirubin: 0.3 mg/dL (ref 0.3–1.2)
Total Protein: 7 g/dL (ref 6.5–8.1)

## 2020-03-29 MED ORDER — SODIUM CHLORIDE 0.9 % IV SOLN
10.0000 mg/kg | Freq: Once | INTRAVENOUS | Status: AC
  Administered 2020-03-29: 500 mg via INTRAVENOUS
  Filled 2020-03-29: qty 16

## 2020-03-29 MED ORDER — SODIUM CHLORIDE 0.9 % IV SOLN
Freq: Once | INTRAVENOUS | Status: AC
  Filled 2020-03-29: qty 250

## 2020-03-29 NOTE — Progress Notes (Signed)
[facility] at [place], [place] [address] [phone]   Interval Evaluation  Date of Service: 03/29/20 Patient Name: Jillian Gomez Patient MRN: [id number] Patient DOB: 1955-04-03 Provider: Ventura Sellers, MD  Identifying Statement:  Jillian Gomez is a 65 y.o. 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 Kathyrn Sheriff.  Path is GBM IDH-wt   01/23/2019 - 03/03/2019 Radiation Therapy   IMRT with concurrent Temozolomide 36m/m2   03/28/2019 - 09/04/2019 Chemotherapy   5 cycles of adjuvant 5-day Temozolomide 2066mm2    09/01/2019 Progression   Progression of disease #1   09/19/2019 -  Chemotherapy   Begins second line therapy with oral CCNU 9037m2 q6 weeks, concurrent Avastin 31m22m IV q2 weeks     Biomarkers:  MGMT Unknown.  IDH 1/2 Wild type.  EGFR Unknown  TERT Unknown   Interval History:  RondKEVA DARTYsents today for avastin infusion. Stable neurologic complaints today, she did see ophthalmologist this week.  No recurrence in abdominal symptoms.  Continues to have deficits with reading and right sided vision, no clear change from prior.  No right sided weakness. No seizures or headaches.  H+P (01/09/19) Patient presented to medical attention in early December with ~2 months history of right sided visual impairment.  She describes fuzzy or blurry vision on that side which was progressive over time, leading to at least one fall.  MRI brain demonstrated enhancing left occipital mass; this was subsequently resected by Dr. NundKathyrn Sheriff12/18/20.  She had no issues with surgery and has completed her steroid taper.  Continues on keppra daily but no history of any seizure. She has returned to work with only modest difficutly.  No issues walking or performing ADLs; lives  alone but her sister and other family members are nearby.  Medications: Current Outpatient Medications on File Prior to Visit  Medication Sig Dispense Refill  . atorvastatin (LIPITOR) 40 MG tablet Take 20 mg by mouth daily. (Patient not taking: Reported on 01/09/2020)    . Biotin 1 MG CAPS Take 1 mg by mouth daily.    . CAMarland KitchenCIUM PO Take 1 tablet by mouth daily.    . doMarland Kitchenycycline (VIBRA-TABS) 100 MG tablet Take 1 tablet by mouth daily.    . EUArna MediciMCG tablet Take 50 mcg by mouth daily.    . naproxen sodium (ALEVE) 220 MG tablet Take 1 tablet (220 mg total) by mouth daily as needed (pain).    . omMarland Kitchenprazole (PRILOSEC) 20 MG capsule Take 1 capsule (20 mg total) by mouth daily. 30 capsule 3  . ondansetron (ZOFRAN) 8 MG tablet Take 1 tablet (8 mg total) by mouth 2 (two) times daily as needed. Start on the third day after chemotherapy. 30 tablet 1  . potassium chloride SA (KLOR-CON) 10 MEQ tablet Take 1 tablet (10 mEq total) by mouth daily. (Patient not taking: No sig reported) 30 tablet 3   No current facility-administered medications on file prior to visit.    Allergies:  Allergies  Allergen Reactions  . Etodolac Other (See Comments)    GI upset  . Macrobid [Nitrofurantoin] Rash   Past Medical History:  Past Medical History:  Diagnosis Date  . High cholesterol   . Hypothyroidism   . Joint pain   . Thrombocytopenia (HCC)[place]21/2022   Past Surgical History:  Past Surgical History:  Procedure Laterality Date  . APPLICATION OF CRANIAL NAVIGATION Left 12/23/2018   Procedure: APPLICATION OF CRANIAL NAVIGATION;  Surgeon: Consuella Lose, MD;  Location: [facility];  Service: Neurosurgery;  Laterality: Left;  APPLICATION OF CRANIAL NAVIGATION  . BREAST BIOPSY Left   . COLONOSCOPY    . CRANIOTOMY Left 12/23/2018   Procedure: STEREOTACTIC LEFT OCCIPITAL CRANIOTOMY FOR RESECTION OF TUMOR;  Surgeon: Consuella Lose, MD;  Location: [facility];  Service: Neurosurgery;  Laterality: Left;  STEREOTACTIC  LEFT OCCIPITAL CRANIOTOMY FOR RESECTION OF TUMOR  . LASIK     Social History:  Social History   Socioeconomic History  . Marital status: Single    Spouse name: Not on file  . Number of children: Not on file  . Years of education: Not on file  . Highest education level: Not on file  Occupational History  . Not on file  Tobacco Use  . Smoking status: Never Smoker  . Smokeless tobacco: Never Used  Vaping Use  . Vaping Use: Never used  Substance and Sexual Activity  . Alcohol use: Not Currently  . Drug use: Never  . Sexual activity: Not on file  Other Topics Concern  . Not on file  Social History Narrative  . Not on file   Social Determinants of Health   Financial Resource Strain: Not on file  Food Insecurity: Not on file  Transportation Needs: Not on file  Physical Activity: Not on file  Stress: Not on file  Social Connections: Not on file  Intimate Partner Violence: Not on file   Family History: No family history on file.  Review of Systems: Constitutional: Denies fevers, chills or abnormal weight loss Eyes: Denies blurriness of vision Gastrointestinal:  Denies nausea, constipation, diarrhea GU: Denies dysuria or incontinence Skin: Denies abnormal skin rashes Musculoskeletal: Denies joint pain, back or neck discomfort.  Behavioral/Psych: Denies anxiety, mood instability  Physical Exam: Vitals:   03/29/20 1229  BP: (!) 142/88  Pulse: 100  Resp: 17  Temp: (!) 97.4 F (36.3 C)  SpO2: 100%   KPS: 80. General: Alert, cooperative, pleasant, in no acute distress Head: Craniotomy scar noted, dry and intact. EENT: No conjunctival injection or scleral icterus. Oral mucosa moist Lungs: Resp effort normal Cardiac: Regular rate and rhythm Abdomen: Soft, non-distended abdomen Skin: No rashes cyanosis or petechiae. Extremities: No clubbing or edema  Neurologic Exam: Mental Status: Awake, alert, attentive to examiner. Oriented to self and environment. Language is  notable for mild transcortical expressive dyshpasia. Cranial Nerves: Visual acuity is grossly normal. Right homonymous hemianopia. Extra-ocular movements intact. No ptosis. Face is symmetric, tongue midline. Motor: Tone and bulk are normal. Power is full in both arms and legs. Reflexes are symmetric, no pathologic reflexes present. Intact finger to nose bilaterally Sensory: Intact to light touch and temperature Gait: Normal and tandem gait is deferred.   Labs: I have reviewed the data as listed    Component Value Date/Time   NA 139 03/12/2020 0910   K 4.0 03/12/2020 0910   CL 108 03/12/2020 0910   CO2 23 03/12/2020 0910   GLUCOSE 85 03/12/2020 0910   BUN 15 03/12/2020 0910   CREATININE 0.92 03/12/2020 0910   CALCIUM 9.6 03/12/2020 0910   PROT 7.1 03/12/2020 0910   ALBUMIN 3.9 03/12/2020 0910   AST 31 03/12/2020 0910   ALT 26 03/12/2020 0910   ALKPHOS 89 03/12/2020 0910   BILITOT 0.3 03/12/2020 0910   GFRNONAA >60 03/12/2020 0910   GFRAA >  60 10/03/2019 1133   Lab Results  Component Value Date   WBC 12.8 (H) 03/29/2020   NEUTROABS 10.5 (H) 03/29/2020   HGB 13.0 03/29/2020   HCT 38.8 03/29/2020   MCV 98.0 03/29/2020   PLT 159 03/29/2020    Assessment/Plan Glioblastoma with isocitrate dehydrogenase gene wildtype (HCC) [C71.9]   REJEANA FADNESS is clinically stable today.    Labs demonstrate improvement following rx with neulasta. We will recommend continuing with day 17/42 cycle #4 oral CCNU 55m/m2.    She is safe to dose Avastin today 138mkg IV q2 weeks.  Chemotherapy should be held for the following:  ANC less than 1,000  Platelets less than 100,000  LFT or creatinine greater than 2x ULN  If clinical concerns/contraindications develop  Avastin should be held for the following:  ANC less than 500  Platelets less than 50,000  LFT or creatinine greater than 2x ULN  If clinical concerns/contraindications develop  No other medication changes today.  We  ask that RoMAURI TEMKINeturn to clinic in 2 weeks with labs for avastin with labs for review.  All questions were answered. The patient knows to call the clinic with any problems, questions or concerns. No barriers to learning were detected.  I have spent a total of 30 minutes of face-to-face and non-face-to-face time, excluding clinical staff time, preparing to see patient, ordering tests and/or medications, counseling the patient, and independently interpreting results and communicating results to the patient/family/caregiver    ZaVentura SellersMD Medical Director of Neuro-Oncology [facility]3/25/22 12:38 PM

## 2020-03-29 NOTE — Patient Instructions (Signed)
[facility] Discharge Instructions for Patients Receiving Chemotherapy  Today you received the following chemotherapy agents Bevacizumab-BVZR(Ziravbev/Avastin)  To help prevent nausea and vomiting after your treatment, we encourage you to take your nausea medication as directed.   If you develop nausea and vomiting that is not controlled by your nausea medication, call the clinic.   BELOW ARE SYMPTOMS THAT SHOULD BE REPORTED IMMEDIATELY:  *FEVER GREATER THAN 100.5 F  *CHILLS WITH OR WITHOUT FEVER  NAUSEA AND VOMITING THAT IS NOT CONTROLLED WITH YOUR NAUSEA MEDICATION  *UNUSUAL SHORTNESS OF BREATH  *UNUSUAL BRUISING OR BLEEDING  TENDERNESS IN MOUTH AND THROAT WITH OR WITHOUT PRESENCE OF ULCERS  *URINARY PROBLEMS  *BOWEL PROBLEMS  UNUSUAL RASH Items with * indicate a potential emergency and should be followed up as soon as possible.  Feel free to call the clinic should you have any questions or concerns. The clinic phone number is [phone].  Please show the [place] at check-in to the Emergency Department and triage nurse.

## 2020-04-03 DIAGNOSIS — Z681 Body mass index (BMI) 19 or less, adult: Secondary | ICD-10-CM | POA: Diagnosis not present

## 2020-04-03 DIAGNOSIS — Z01419 Encounter for gynecological examination (general) (routine) without abnormal findings: Secondary | ICD-10-CM | POA: Diagnosis not present

## 2020-04-03 DIAGNOSIS — Z124 Encounter for screening for malignant neoplasm of cervix: Secondary | ICD-10-CM | POA: Diagnosis not present

## 2020-04-04 ENCOUNTER — Other Ambulatory Visit (HOSPITAL_COMMUNITY): Payer: Self-pay

## 2020-04-11 ENCOUNTER — Other Ambulatory Visit: Payer: Self-pay | Admitting: Radiation Therapy

## 2020-04-16 ENCOUNTER — Other Ambulatory Visit: Payer: Self-pay

## 2020-04-16 ENCOUNTER — Inpatient Hospital Stay: Payer: BC Managed Care – PPO

## 2020-04-16 ENCOUNTER — Inpatient Hospital Stay: Payer: BC Managed Care – PPO | Admitting: Internal Medicine

## 2020-04-16 ENCOUNTER — Inpatient Hospital Stay: Payer: BC Managed Care – PPO | Attending: Internal Medicine

## 2020-04-16 VITALS — BP 134/85 | HR 88 | Temp 97.1°F | Resp 18 | Wt 100.2 lb

## 2020-04-16 DIAGNOSIS — Z5112 Encounter for antineoplastic immunotherapy: Secondary | ICD-10-CM | POA: Insufficient documentation

## 2020-04-16 DIAGNOSIS — Z79899 Other long term (current) drug therapy: Secondary | ICD-10-CM | POA: Diagnosis not present

## 2020-04-16 DIAGNOSIS — C719 Malignant neoplasm of brain, unspecified: Secondary | ICD-10-CM | POA: Diagnosis not present

## 2020-04-16 DIAGNOSIS — C714 Malignant neoplasm of occipital lobe: Secondary | ICD-10-CM | POA: Diagnosis not present

## 2020-04-16 LAB — CMP (CANCER CENTER ONLY)
ALT: 31 U/L (ref 0–44)
AST: 43 U/L — ABNORMAL HIGH (ref 15–41)
Albumin: 3.6 g/dL (ref 3.5–5.0)
Alkaline Phosphatase: 139 U/L — ABNORMAL HIGH (ref 38–126)
Anion gap: 14 (ref 5–15)
BUN: 17 mg/dL (ref 8–23)
CO2: 22 mmol/L (ref 22–32)
Calcium: 9.2 mg/dL (ref 8.9–10.3)
Chloride: 106 mmol/L (ref 98–111)
Creatinine: 0.97 mg/dL (ref 0.44–1.00)
GFR, Estimated: >60 mL/min (ref >60)
Glucose, Bld: 109 mg/dL — ABNORMAL HIGH (ref 70–99)
Potassium: 3.8 mmol/L (ref 3.5–5.1)
Sodium: 142 mmol/L (ref 135–145)
Total Bilirubin: 0.6 mg/dL (ref 0.3–1.2)
Total Protein: 6.7 g/dL (ref 6.5–8.1)

## 2020-04-16 LAB — CBC WITH DIFFERENTIAL (CANCER CENTER ONLY)
Abs Immature Granulocytes: 0.01 10*3/uL (ref 0.00–0.07)
Basophils Absolute: 0 10*3/uL (ref 0.0–0.1)
Basophils Relative: 1 %
Eosinophils Absolute: 0.2 10*3/uL (ref 0.0–0.5)
Eosinophils Relative: 4 %
HCT: 38 % (ref 36.0–46.0)
Hemoglobin: 12.9 g/dL (ref 12.0–15.0)
Immature Granulocytes: 0 %
Lymphocytes Relative: 21 %
Lymphs Abs: 1.1 10*3/uL (ref 0.7–4.0)
MCH: 33.2 pg (ref 26.0–34.0)
MCHC: 33.9 g/dL (ref 30.0–36.0)
MCV: 97.9 fL (ref 80.0–100.0)
Monocytes Absolute: 0.6 10*3/uL (ref 0.1–1.0)
Monocytes Relative: 11 %
Neutro Abs: 3.3 10*3/uL (ref 1.7–7.7)
Neutrophils Relative %: 63 %
Platelet Count: 45 10*3/uL — ABNORMAL LOW (ref 150–400)
RBC: 3.88 MIL/uL (ref 3.87–5.11)
RDW: 14.6 % (ref 11.5–15.5)
WBC Count: 5.2 10*3/uL (ref 4.0–10.5)
nRBC: 0 % (ref 0.0–0.2)

## 2020-04-16 LAB — TOTAL PROTEIN, URINE DIPSTICK: Protein, ur: 100 mg/dL — AB

## 2020-04-16 NOTE — Progress Notes (Signed)
[facility] at [place], [place] [address] [phone]   Interval Evaluation  Date of Service: 04/16/20 Patient Name: Jillian Gomez Patient MRN: [id number] Patient DOB: [birth year] Provider: Ventura Sellers, MD  Identifying Statement:  Jillian Gomez is a 65 y.o. female with left occipital glioblastoma   Oncologic History: Oncology History  Glioblastoma with isocitrate dehydrogenase gene wildtype ([facility])  12/23/2018 Surgery   Craniotomy, left occipital resection by Dr. Kathyrn Sheriff.  Path is GBM IDH-wt   01/23/2019 - 03/03/2019 Radiation Therapy   IMRT with concurrent Temozolomide 46m/m2   03/28/2019 - 09/04/2019 Chemotherapy   5 cycles of adjuvant 5-day Temozolomide 2070mm2    09/01/2019 Progression   Progression of disease #1   09/19/2019 -  Chemotherapy   Begins second line therapy with oral CCNU 9078m2 q6 weeks, concurrent Avastin 93m40m IV q2 weeks     Biomarkers:  MGMT Unknown.  IDH 1/2 Wild type.  EGFR Unknown  TERT Unknown   Interval History:  Jillian MABEsents today for avastin infusion. No new or progressive neurologic symptoms, aside from occasional lapses in memory or focus.  No recurrence in abdominal symptoms.  Continues to have deficits with reading and right sided vision, no clear change from prior.  No right sided weakness. No seizures or headaches.  H+P (01/09/19) Patient presented to medical attention in early December with ~2 months history of right sided visual impairment.  She describes fuzzy or blurry vision on that side which was progressive over time, leading to at least one fall.  MRI brain demonstrated enhancing left occipital mass; this was subsequently resected by Dr. NundKathyrn Sheriff12/18/20.  She had no issues with surgery and has completed her steroid taper.  Continues on keppra daily but no history of any seizure. She has returned to work with only modest difficutly.  No issues walking or  performing ADLs; lives alone but her sister and other family members are nearby.  Medications: Current Outpatient Medications on File Prior to Visit  Medication Sig Dispense Refill  . Biotin 1 MG CAPS Take 1 mg by mouth daily.    . CAMarland KitchenCIUM PO Take 1 tablet by mouth daily.    . EUArna MediciMCG tablet Take 50 mcg by mouth daily.    . naproxen sodium (ALEVE) 220 MG tablet Take 1 tablet (220 mg total) by mouth daily as needed (pain).    . omMarland Kitchenprazole (PRILOSEC) 20 MG capsule Take 1 capsule (20 mg total) by mouth daily. 30 capsule 3  . atorvastatin (LIPITOR) 40 MG tablet Take 20 mg by mouth daily. (Patient not taking: No sig reported)    . doxycycline (VIBRA-TABS) 100 MG tablet Take 1 tablet by mouth daily. (Patient not taking: Reported on 04/16/2020)    . ondansetron (ZOFRAN) 8 MG tablet Take 1 tablet (8 mg total) by mouth 2 (two) times daily as needed. Start on the third day after chemotherapy. (Patient not taking: Reported on 04/16/2020) 30 tablet 1  . potassium chloride SA (KLOR-CON) 10 MEQ tablet Take 1 tablet (10 mEq total) by mouth daily. (Patient not taking: No sig reported) 30 tablet 3   No current facility-administered medications on file prior to visit.    Allergies:  Allergies  Allergen Reactions  . Etodolac Other (See Comments)    GI upset  . Macrobid [Nitrofurantoin] Rash   Past Medical History:  Past Medical History:  Diagnosis Date  . High cholesterol   . Hypothyroidism   .  Joint pain   . Thrombocytopenia ([facility]) 02/26/2020   Past Surgical History:  Past Surgical History:  Procedure Laterality Date  . APPLICATION OF CRANIAL NAVIGATION Left 12/23/2018   Procedure: APPLICATION OF CRANIAL NAVIGATION;  Surgeon: Consuella Lose, MD;  Location: [facility];  Service: Neurosurgery;  Laterality: Left;  APPLICATION OF CRANIAL NAVIGATION  . BREAST BIOPSY Left   . COLONOSCOPY    . CRANIOTOMY Left 12/23/2018   Procedure: STEREOTACTIC LEFT OCCIPITAL CRANIOTOMY FOR RESECTION OF TUMOR;   Surgeon: Consuella Lose, MD;  Location: [facility];  Service: Neurosurgery;  Laterality: Left;  STEREOTACTIC LEFT OCCIPITAL CRANIOTOMY FOR RESECTION OF TUMOR  . LASIK     Social History:  Social History   Socioeconomic History  . Marital status: Single    Spouse name: Not on file  . Number of children: Not on file  . Years of education: Not on file  . Highest education level: Not on file  Occupational History  . Not on file  Tobacco Use  . Smoking status: Never Smoker  . Smokeless tobacco: Never Used  Vaping Use  . Vaping Use: Never used  Substance and Sexual Activity  . Alcohol use: Not Currently  . Drug use: Never  . Sexual activity: Not on file  Other Topics Concern  . Not on file  Social History Narrative  . Not on file   Social Determinants of Health   Financial Resource Strain: Not on file  Food Insecurity: Not on file  Transportation Needs: Not on file  Physical Activity: Not on file  Stress: Not on file  Social Connections: Not on file  Intimate Partner Violence: Not on file   Family History: No family history on file.  Review of Systems: Constitutional: Denies fevers, chills or abnormal weight loss Eyes: Denies blurriness of vision Gastrointestinal:  Denies nausea, constipation, diarrhea GU: Denies dysuria or incontinence Skin: Denies abnormal skin rashes Musculoskeletal: Denies joint pain, back or neck discomfort.  Behavioral/Psych: Denies anxiety, mood instability  Physical Exam: Vitals:   04/16/20 1330  BP: 134/85  Pulse: 88  Resp: 18  Temp: (!) 97.1 F (36.2 C)  SpO2: 100%   KPS: 80. General: Alert, cooperative, pleasant, in no acute distress Head: Craniotomy scar noted, dry and intact. EENT: No conjunctival injection or scleral icterus. Oral mucosa moist Lungs: Resp effort normal Cardiac: Regular rate and rhythm Abdomen: Soft, non-distended abdomen Skin: No rashes cyanosis or petechiae. Extremities: No clubbing or edema  Neurologic  Exam: Mental Status: Awake, alert, attentive to examiner. Oriented to self and environment. Language is notable for mild transcortical expressive dyshpasia. Cranial Nerves: Visual acuity is grossly normal. Right homonymous hemianopia. Extra-ocular movements intact. No ptosis. Face is symmetric, tongue midline. Motor: Tone and bulk are normal. Power is full in both arms and legs. Reflexes are symmetric, no pathologic reflexes present. Intact finger to nose bilaterally Sensory: Intact to light touch and temperature Gait: Normal and tandem gait is deferred.   Labs: I have reviewed the data as listed    Component Value Date/Time   NA 141 03/29/2020 1210   K 4.3 03/29/2020 1210   CL 106 03/29/2020 1210   CO2 22 03/29/2020 1210   GLUCOSE 89 03/29/2020 1210   BUN 19 03/29/2020 1210   CREATININE 0.84 03/29/2020 1210   CALCIUM 9.4 03/29/2020 1210   PROT 7.0 03/29/2020 1210   ALBUMIN 3.7 03/29/2020 1210   AST 52 (H) 03/29/2020 1210   ALT 53 (H) 03/29/2020 1210   ALKPHOS 165 (H) 03/29/2020  1210   BILITOT 0.3 03/29/2020 1210   GFRNONAA >60 03/29/2020 1210   GFRAA >60 10/03/2019 1133   Lab Results  Component Value Date   WBC 5.2 04/16/2020   NEUTROABS 3.3 04/16/2020   HGB 12.9 04/16/2020   HCT 38.0 04/16/2020   MCV 97.9 04/16/2020   PLT 45 (L) 04/16/2020    Assessment/Plan Glioblastoma with isocitrate dehydrogenase gene wildtype (HCC) [C71.9]   LEONOR DARNELL is clinically stable today.    Labs demonstrate thrombocytopenia and proteinuria; we will hold avastin infusion today.   Will continuing with day 17/42 cycle #4 oral CCNU 50m/m2.    Chemotherapy should be held for the following:  ANC less than 1,000  Platelets less than 100,000  LFT or creatinine greater than 2x ULN  If clinical concerns/contraindications develop  Avastin should be held for the following:  ANC less than 500  Platelets less than 50,000  LFT or creatinine greater than 2x ULN  If clinical  concerns/contraindications develop  We ask that RNadyne Coombesreturn to clinic in 2 weeks with MRI brain for review, prior to cycle #5.  All questions were answered. The patient knows to call the clinic with any problems, questions or concerns. No barriers to learning were detected.  I have spent a total of 30 minutes of face-to-face and non-face-to-face time, excluding clinical staff time, preparing to see patient, ordering tests and/or medications, counseling the patient, and independently interpreting results and communicating results to the patient/family/caregiver    ZVentura Sellers MD Medical Director of Neuro-Oncology [facility]04/12/22 1:43 PM

## 2020-04-17 ENCOUNTER — Other Ambulatory Visit (HOSPITAL_COMMUNITY): Payer: Self-pay

## 2020-04-22 ENCOUNTER — Telehealth: Payer: Self-pay | Admitting: Internal Medicine

## 2020-04-22 NOTE — Telephone Encounter (Signed)
Scheduled appt per 4/18 sch msg. Pt aware.

## 2020-04-25 ENCOUNTER — Ambulatory Visit: Payer: BC Managed Care – PPO

## 2020-04-26 ENCOUNTER — Other Ambulatory Visit: Payer: Self-pay

## 2020-04-26 ENCOUNTER — Ambulatory Visit (HOSPITAL_COMMUNITY)
Admission: RE | Admit: 2020-04-26 | Discharge: 2020-04-26 | Disposition: A | Discharge status: Home / Self Care | Payer: BC Managed Care – PPO | Source: Ambulatory Visit | Attending: Internal Medicine | Admitting: Internal Medicine

## 2020-04-26 DIAGNOSIS — Z9889 Other specified postprocedural states: Secondary | ICD-10-CM | POA: Diagnosis not present

## 2020-04-26 DIAGNOSIS — Z5111 Encounter for antineoplastic chemotherapy: Secondary | ICD-10-CM | POA: Diagnosis not present

## 2020-04-26 DIAGNOSIS — C719 Malignant neoplasm of brain, unspecified: Secondary | ICD-10-CM | POA: Diagnosis not present

## 2020-04-26 IMAGING — MR MR HEAD WO/W CM
16 series · 48 of 48 positions shown · IV contrast (gadavist)
Comparison: [DATE]

CLINICAL DATA: Glioblastoma follow-up, on chemotherapy and Avastin

EXAM:
MRI HEAD WITHOUT AND WITH CONTRAST
TECHNIQUE: Multiplanar, multiecho pulse sequences of the brain and surrounding
structures were obtained without and with intravenous contrast.
CONTRAST:  5mL GADAVIST GADOBUTROL 1 MMOL/ML IV SOLN

[Series 5: DWI · axial · 3.0mm · 1.36mm/px · z∈[-26,+115]mm · 6 of 96 slices shown (1 of 2)]
[im 1/96]
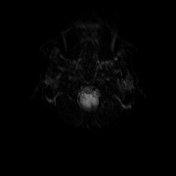
[im 20/96]
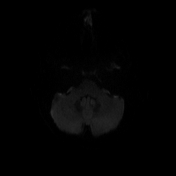
[im 39/96]
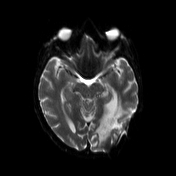
[im 58/96]
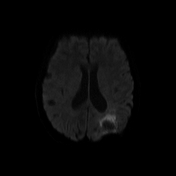
[im 77/96]
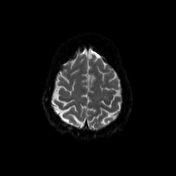
[im 96/96]
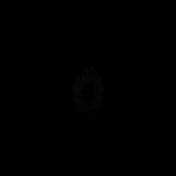

[Series 6: DWI · axial · 3.0mm · 1.36mm/px · z∈[-26,+115]mm · 4 of 48 slices shown (2 of 2)]
[im 1/48]
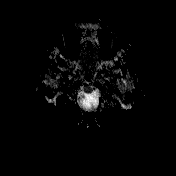
[im 16/48]
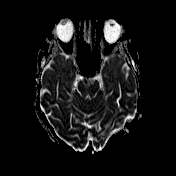
[im 32/48]
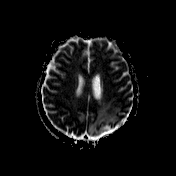
[im 48/48]
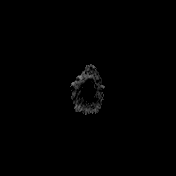

[Series 7: T1 · sagittal · 5.0mm · 0.75mm/px · 1 of 24 slices shown (1 of 4)]
[im 1/24]
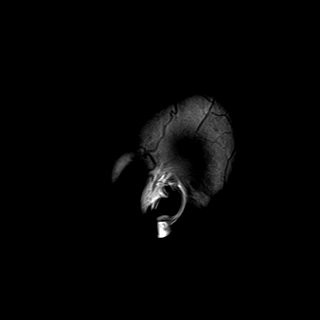

[Series 8: T2 · axial · 5.0mm · 0.62mm/px · 1 of 24 slices shown]
[im 1/24]
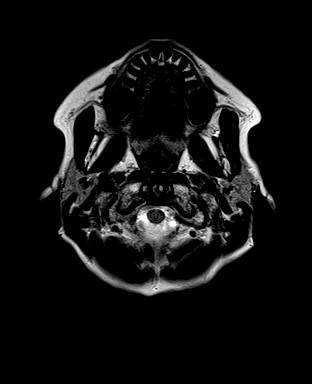

[Series 9: mip_images(sw) · axial · 24.0mm · 0.75mm/px · z∈[-29,+115]mm · 3 of 49 slices shown]
[im 1/49]
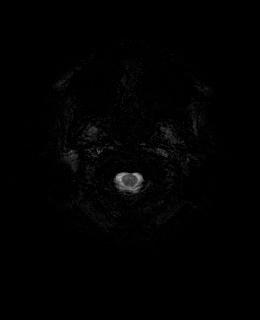
[im 25/49]
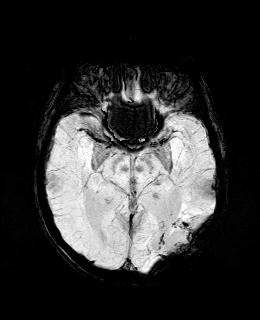
[im 49/49]
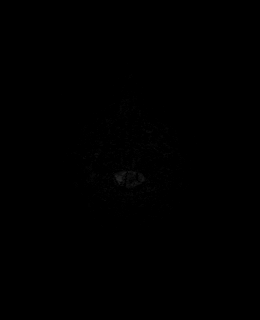

[Series 10: swi_images · axial · 3.0mm · 0.75mm/px · z∈[-39,+125]mm · 3 of 56 slices shown]
[im 1/56]
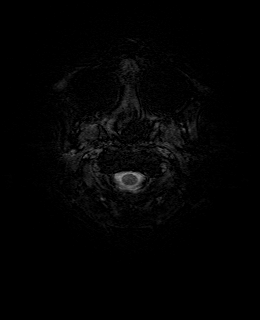
[im 28/56]
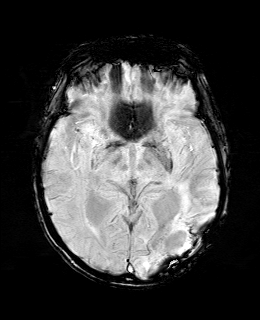
[im 56/56]
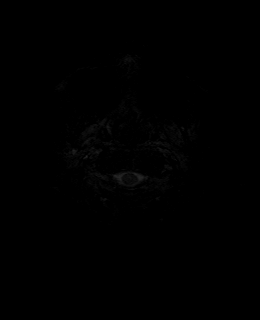

[Series 11: FLAIR · axial · 3.0mm · 0.75mm/px · z∈[-33,+119]mm · 3 of 52 slices shown]
[im 1/52]
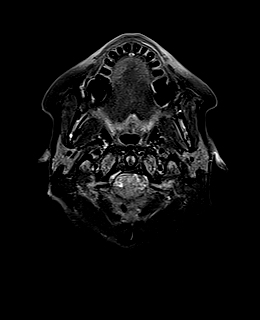
[im 26/52]
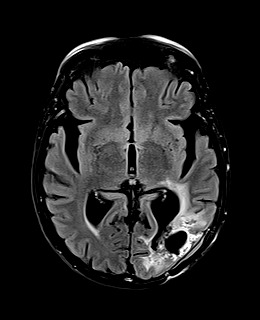
[im 52/52]
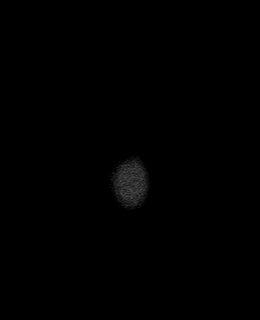

[Series 12: T1 · axial · 1.0mm · 0.94mm/px · z∈[-28,+114]mm · 8 of 144 slices shown (2 of 4)]
[im 1/144]
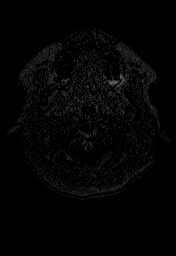
[im 21/144]
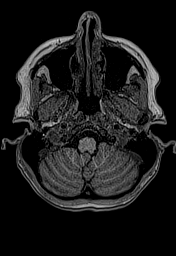
[im 41/144]
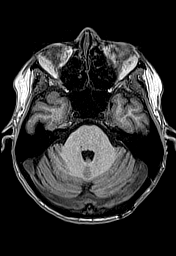
[im 62/144]
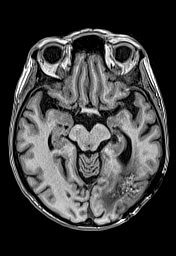
[im 82/144]
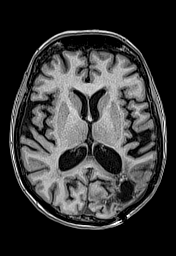
[im 103/144]
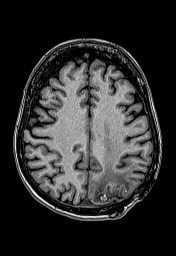
[im 123/144]
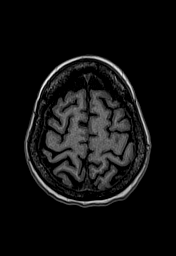
[im 144/144]
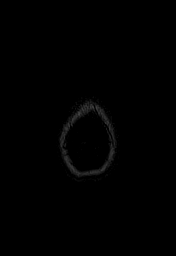

[Series 13: cor dwi_tracew · coronal · 5.0mm · 1.53mm/px · 3 of 48 slices shown]
[im 1/48]
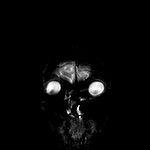
[im 24/48]
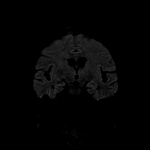
[im 48/48]
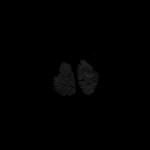

[Series 14: cor dwi_adc · coronal · 5.0mm · 1.53mm/px · 1 of 24 slices shown]
[im 1/24]
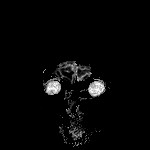

[Series 15: T2 post-contrast · coronal · 5.0mm · 0.57mm/px · 1 of 24 slices shown]
[im 1/24]
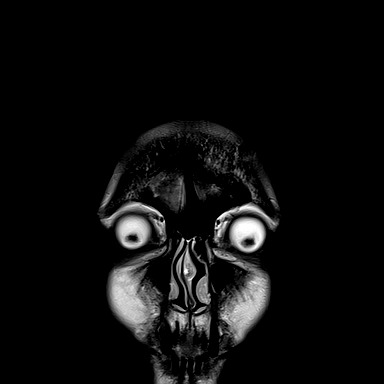

[Series 16: T1 post-contrast · axial · 1.0mm · 0.94mm/px · z∈[-28,+114]mm · 8 of 144 slices shown (1 of 3)]
[im 1/144]
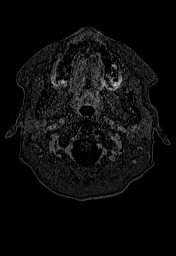
[im 21/144]
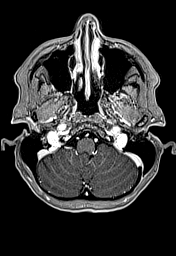
[im 41/144]
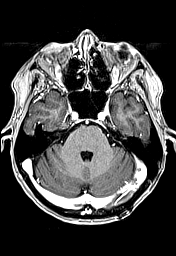
[im 62/144]
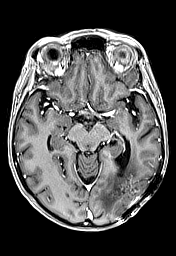
[im 82/144]
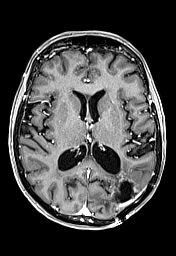
[im 103/144]
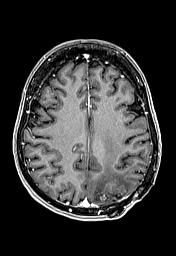
[im 123/144]
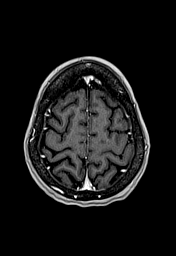
[im 144/144]
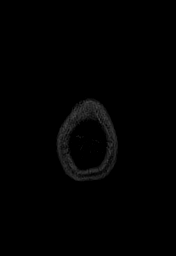

[Series 17: T1 · sagittal · 4.0mm · 0.94mm/px · 2 of 30 slices shown (3 of 4)]
[im 1/30]
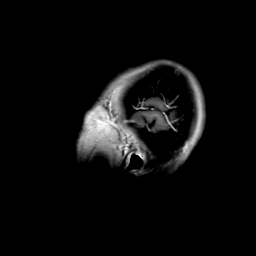
[im 30/30]
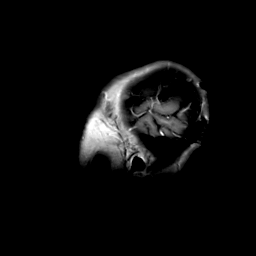

[Series 18: T1 · coronal · 4.0mm · 0.94mm/px · 2 of 30 slices shown (4 of 4)]
[im 1/30]
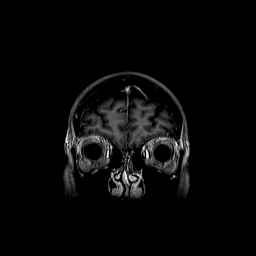
[im 30/30]
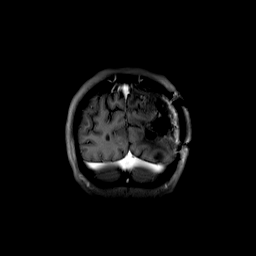

[Series 19: T1 post-contrast · coronal · 5.0mm · 0.43mm/px · 1 of 24 slices shown (2 of 3)]
[im 1/24]
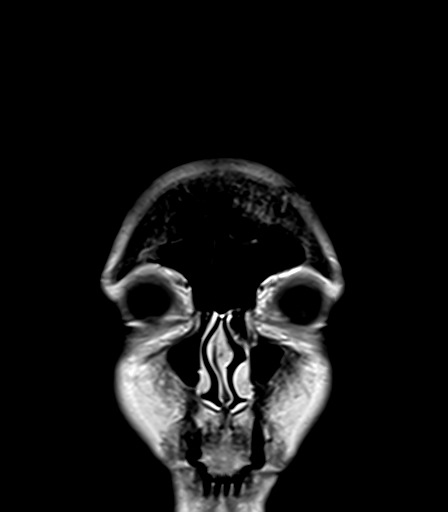

[Series 20: T1 post-contrast · sagittal · 5.0mm · 0.75mm/px · 1 of 24 slices shown (3 of 3)]
[im 1/24]
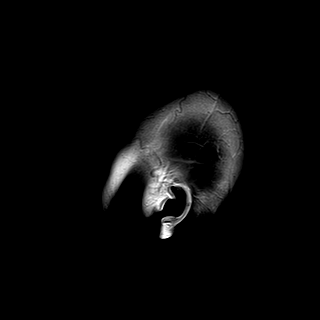

[48 of 48 positions shown; findings below may reference images not displayed]

FINDINGS: Brain: Postoperative changes are again identified with CSF intensity
resection cavity in the lateral left occipital lobe. Adjacent foci
of T1 hyperintensity are again identified. Minimal, if any
enhancement is identified. Extent of surrounding mildly reduced
diffusion and T2 FLAIR hyperintensity not substantially changed.
There may be slightly increased T2 FLAIR hyperintensity within a
left superior parietal lobule gyrus (series 11, image 38 versus also
series 11, image 38 on prior). T2 FLAIR hyperintensity in the left
parietal lobe has more definitively increased compared to the
[DATE] study.

No acute infarction.  Ventricles are stable in size.

Vascular: Major vessel flow voids at the skull base are preserved.

Skull and upper cervical spine: Normal marrow signal is preserved.

Sinuses/Orbits: Paranasal sinuses are aerated. Orbits are
unremarkable.

Other: Sella is unremarkable.  Mastoid air cells are clear.
IMPRESSION: No new enhancement.

No substantial change in extent of abnormal T2 FLAIR hyperintensity.
Question of minimal increase in the left parietal lobe (more
definitively increased compared to [DATE]).[DATE] reflect
progression of infiltrating tumor; continued attention on follow-up.

## 2020-04-26 MED ORDER — GADOBUTROL 1 MMOL/ML IV SOLN
5.0000 mL | Freq: Once | INTRAVENOUS | Status: AC | PRN
  Administered 2020-04-26: 5 mL via INTRAVENOUS

## 2020-04-29 ENCOUNTER — Inpatient Hospital Stay: Payer: BC Managed Care – PPO

## 2020-04-30 ENCOUNTER — Inpatient Hospital Stay: Payer: BC Managed Care – PPO

## 2020-04-30 ENCOUNTER — Other Ambulatory Visit: Payer: Self-pay

## 2020-04-30 ENCOUNTER — Inpatient Hospital Stay: Payer: BC Managed Care – PPO | Admitting: Internal Medicine

## 2020-04-30 VITALS — BP 126/78 | HR 91 | Temp 97.5°F | Resp 18 | Ht 62.0 in | Wt 100.4 lb

## 2020-04-30 DIAGNOSIS — C719 Malignant neoplasm of brain, unspecified: Secondary | ICD-10-CM

## 2020-04-30 DIAGNOSIS — Z79899 Other long term (current) drug therapy: Secondary | ICD-10-CM | POA: Diagnosis not present

## 2020-04-30 DIAGNOSIS — Z5112 Encounter for antineoplastic immunotherapy: Secondary | ICD-10-CM | POA: Diagnosis not present

## 2020-04-30 DIAGNOSIS — C714 Malignant neoplasm of occipital lobe: Secondary | ICD-10-CM | POA: Diagnosis not present

## 2020-04-30 LAB — CBC WITH DIFFERENTIAL (CANCER CENTER ONLY)
Abs Immature Granulocytes: 0.01 10*3/uL (ref 0.00–0.07)
Basophils Absolute: 0 10*3/uL (ref 0.0–0.1)
Basophils Relative: 0 %
Eosinophils Absolute: 0.2 10*3/uL (ref 0.0–0.5)
Eosinophils Relative: 4 %
HCT: 32.8 % — ABNORMAL LOW (ref 36.0–46.0)
Hemoglobin: 11 g/dL — ABNORMAL LOW (ref 12.0–15.0)
Immature Granulocytes: 0 %
Lymphocytes Relative: 14 %
Lymphs Abs: 0.7 10*3/uL (ref 0.7–4.0)
MCH: 33.6 pg (ref 26.0–34.0)
MCHC: 33.5 g/dL (ref 30.0–36.0)
MCV: 100.3 fL — ABNORMAL HIGH (ref 80.0–100.0)
Monocytes Absolute: 0.4 10*3/uL (ref 0.1–1.0)
Monocytes Relative: 9 %
Neutro Abs: 3.5 10*3/uL (ref 1.7–7.7)
Neutrophils Relative %: 73 %
Platelet Count: 46 10*3/uL — ABNORMAL LOW (ref 150–400)
RBC: 3.27 MIL/uL — ABNORMAL LOW (ref 3.87–5.11)
RDW: 14.4 % (ref 11.5–15.5)
WBC Count: 4.8 10*3/uL (ref 4.0–10.5)
nRBC: 0 % (ref 0.0–0.2)

## 2020-04-30 LAB — CMP (CANCER CENTER ONLY)
ALT: 38 U/L (ref 0–44)
AST: 47 U/L — ABNORMAL HIGH (ref 15–41)
Albumin: 3.4 g/dL — ABNORMAL LOW (ref 3.5–5.0)
Alkaline Phosphatase: 172 U/L — ABNORMAL HIGH (ref 38–126)
Anion gap: 9 (ref 5–15)
BUN: 26 mg/dL — ABNORMAL HIGH (ref 8–23)
CO2: 24 mmol/L (ref 22–32)
Calcium: 9.3 mg/dL (ref 8.9–10.3)
Chloride: 109 mmol/L (ref 98–111)
Creatinine: 0.84 mg/dL (ref 0.44–1.00)
GFR, Estimated: >60 mL/min (ref >60)
Glucose, Bld: 83 mg/dL (ref 70–99)
Potassium: 3.9 mmol/L (ref 3.5–5.1)
Sodium: 142 mmol/L (ref 135–145)
Total Bilirubin: 0.7 mg/dL (ref 0.3–1.2)
Total Protein: 6.5 g/dL (ref 6.5–8.1)

## 2020-04-30 LAB — TOTAL PROTEIN, URINE DIPSTICK: Protein, ur: 100 mg/dL — AB

## 2020-04-30 MED ORDER — SODIUM CHLORIDE 0.9 % IV SOLN
Freq: Once | INTRAVENOUS | Status: AC
Start: 2020-04-30 — End: 2020-04-30
  Filled 2020-04-30: qty 250

## 2020-04-30 MED ORDER — SODIUM CHLORIDE 0.9 % IV SOLN
10.0000 mg/kg | Freq: Once | INTRAVENOUS | Status: AC
  Administered 2020-04-30: 500 mg via INTRAVENOUS
  Filled 2020-04-30: qty 16

## 2020-04-30 NOTE — Progress Notes (Signed)
Per Dr Mickeal Skinner ok to proceed with Avastin infusion today 04/30/2020.  He plans to stop her oral chemo instead.

## 2020-04-30 NOTE — Progress Notes (Signed)
[facility] at [place], [place] [address] [phone]   Interval Evaluation  Date of Service: 04/30/20 Patient Name: Jillian Gomez Patient MRN: [id number] Patient DOB: 04-30-1955 Provider: Ventura Sellers, MD  Identifying Statement:  Jillian Gomez is a 65 y.o. female with left occipital glioblastoma   Oncologic History: Oncology History  Glioblastoma with isocitrate dehydrogenase gene wildtype ([facility])  12/23/2018 Surgery   Craniotomy, left occipital resection by Dr. Kathyrn Sheriff.  Path is GBM IDH-wt   01/23/2019 - 03/03/2019 Radiation Therapy   IMRT with concurrent Temozolomide 75mg /m2   03/28/2019 - 09/04/2019 Chemotherapy   5 cycles of adjuvant 5-day Temozolomide 200mg /m2    09/01/2019 Progression   Progression of disease #1   09/19/2019 -  Chemotherapy   Begins second line therapy with oral CCNU 90mg /m2 q6 weeks, concurrent Avastin 10mg /kg IV q2 weeks     Biomarkers:  MGMT Unknown.  IDH 1/2 Wild type.  EGFR Unknown  TERT Unknown   Interval History:  Jillian Gomez presents today for evaluation following MRI brain. She denies new or progressive neurologic symptoms today.  No recurrence in abdominal symptoms.  Continues to have deficits with reading and right sided vision, no clear change from prior.  No right sided weakness. No seizures or headaches.  H+P (01/09/19) Patient presented to medical attention in early December with ~2 months history of right sided visual impairment.  She describes fuzzy or blurry vision on that side which was progressive over time, leading to at least one fall.  MRI brain demonstrated enhancing left occipital mass; this was subsequently resected by Dr. Kathyrn Sheriff on 12/23/18.  She had no issues with surgery and has completed her steroid taper.  Continues on keppra daily but no history of any seizure. She has returned to work with only modest difficutly.  No issues walking or performing ADLs; lives  alone but her sister and other family members are nearby.  Medications: Current Outpatient Medications on File Prior to Visit  Medication Sig Dispense Refill  . Biotin 1 MG CAPS Take 1 mg by mouth daily.    Marland Kitchen CALCIUM PO Take 1 tablet by mouth daily.    Arna Medici 50 MCG tablet Take 50 mcg by mouth daily.    . naproxen sodium (ALEVE) 220 MG tablet Take 1 tablet (220 mg total) by mouth daily as needed (pain).    Marland Kitchen omeprazole (PRILOSEC) 20 MG capsule Take 1 capsule (20 mg total) by mouth daily. 30 capsule 3  . atorvastatin (LIPITOR) 40 MG tablet Take 20 mg by mouth daily. (Patient not taking: No sig reported)    . doxycycline (VIBRA-TABS) 100 MG tablet Take 1 tablet by mouth daily. (Patient not taking: No sig reported)    . ondansetron (ZOFRAN) 8 MG tablet Take 1 tablet (8 mg total) by mouth 2 (two) times daily as needed. Start on the third day after chemotherapy. (Patient not taking: No sig reported) 30 tablet 1  . potassium chloride SA (KLOR-CON) 10 MEQ tablet Take 1 tablet (10 mEq total) by mouth daily. (Patient not taking: No sig reported) 30 tablet 3   No current facility-administered medications on file prior to visit.    Allergies:  Allergies  Allergen Reactions  . Etodolac Other (See Comments)    GI upset  . Macrobid [Nitrofurantoin] Rash   Past Medical History:  Past Medical History:  Diagnosis Date  . High cholesterol   . Hypothyroidism   . Joint pain   .  Thrombocytopenia ([facility]) 02/26/2020   Past Surgical History:  Past Surgical History:  Procedure Laterality Date  . APPLICATION OF CRANIAL NAVIGATION Left 12/23/2018   Procedure: APPLICATION OF CRANIAL NAVIGATION;  Surgeon: Consuella Lose, MD;  Location: [facility];  Service: Neurosurgery;  Laterality: Left;  APPLICATION OF CRANIAL NAVIGATION  . BREAST BIOPSY Left   . COLONOSCOPY    . CRANIOTOMY Left 12/23/2018   Procedure: STEREOTACTIC LEFT OCCIPITAL CRANIOTOMY FOR RESECTION OF TUMOR;  Surgeon: Consuella Lose, MD;   Location: [facility];  Service: Neurosurgery;  Laterality: Left;  STEREOTACTIC LEFT OCCIPITAL CRANIOTOMY FOR RESECTION OF TUMOR  . LASIK     Social History:  Social History   Socioeconomic History  . Marital status: Single    Spouse name: Not on file  . Number of children: Not on file  . Years of education: Not on file  . Highest education level: Not on file  Occupational History  . Not on file  Tobacco Use  . Smoking status: Never Smoker  . Smokeless tobacco: Never Used  Vaping Use  . Vaping Use: Never used  Substance and Sexual Activity  . Alcohol use: Not Currently  . Drug use: Never  . Sexual activity: Not on file  Other Topics Concern  . Not on file  Social History Narrative  . Not on file   Social Determinants of Health   Financial Resource Strain: Not on file  Food Insecurity: Not on file  Transportation Needs: Not on file  Physical Activity: Not on file  Stress: Not on file  Social Connections: Not on file  Intimate Partner Violence: Not on file   Family History: No family history on file.  Review of Systems: Constitutional: Denies fevers, chills or abnormal weight loss Eyes: Denies blurriness of vision Gastrointestinal:  Denies nausea, constipation, diarrhea GU: Denies dysuria or incontinence Skin: Denies abnormal skin rashes Musculoskeletal: Denies joint pain, back or neck discomfort.  Behavioral/Psych: Denies anxiety, mood instability  Physical Exam: Vitals:   04/30/20 1043  BP: 126/78  Pulse: 91  Resp: 18  Temp: (!) 97.5 F (36.4 C)  SpO2: 100%   KPS: 80. General: Alert, cooperative, pleasant, in no acute distress Head: Craniotomy scar noted, dry and intact. EENT: No conjunctival injection or scleral icterus. Oral mucosa moist Lungs: Resp effort normal Cardiac: Regular rate and rhythm Abdomen: Soft, non-distended abdomen Skin: No rashes cyanosis or petechiae. Extremities: No clubbing or edema  Neurologic Exam: Mental Status: Awake, alert,  attentive to examiner. Oriented to self and environment. Language is notable for mild transcortical expressive dyshpasia. Cranial Nerves: Visual acuity is grossly normal. Right homonymous hemianopia. Extra-ocular movements intact. No ptosis. Face is symmetric, tongue midline. Motor: Tone and bulk are normal. Power is full in both arms and legs. Reflexes are symmetric, no pathologic reflexes present. Intact finger to nose bilaterally Sensory: Intact to light touch and temperature Gait: Normal and tandem gait is deferred.   Labs: I have reviewed the data as listed    Component Value Date/Time   NA 142 04/30/2020 1027   K 3.9 04/30/2020 1027   CL 109 04/30/2020 1027   CO2 24 04/30/2020 1027   GLUCOSE 83 04/30/2020 1027   BUN 26 (H) 04/30/2020 1027   CREATININE 0.84 04/30/2020 1027   CALCIUM 9.3 04/30/2020 1027   PROT 6.5 04/30/2020 1027   ALBUMIN 3.4 (L) 04/30/2020 1027   AST 47 (H) 04/30/2020 1027   ALT 38 04/30/2020 1027   ALKPHOS 172 (H) 04/30/2020 1027   BILITOT  0.7 04/30/2020 1027   GFRNONAA >60 04/30/2020 1027   GFRAA >60 10/03/2019 1133   Lab Results  Component Value Date   WBC 4.8 04/30/2020   NEUTROABS 3.5 04/30/2020   HGB 11.0 (L) 04/30/2020   HCT 32.8 (L) 04/30/2020   MCV 100.3 (H) 04/30/2020   PLT 46 (L) 04/30/2020   Imaging:  [facility] Clinician Interpretation: I have personally reviewed the CNS images as listed.  My interpretation, in the context of the patient's clinical presentation, is stable disease  MR BRAIN W WO CONTRAST  Result Date: 04/26/2020 CLINICAL DATA:  Glioblastoma follow-up, on chemotherapy and Avastin EXAM: MRI HEAD WITHOUT AND WITH CONTRAST TECHNIQUE: Multiplanar, multiecho pulse sequences of the brain and surrounding structures were obtained without and with intravenous contrast. CONTRAST:  62m GADAVIST GADOBUTROL 1 MMOL/ML IV SOLN COMPARISON:  02/24/2020 FINDINGS: Brain: Postoperative changes are again identified with CSF intensity resection cavity  in the lateral left occipital lobe. Adjacent foci of T1 hyperintensity are again identified. Minimal, if any enhancement is identified. Extent of surrounding mildly reduced diffusion and T2 FLAIR hyperintensity not substantially changed. There may be slightly increased T2 FLAIR hyperintensity within a left superior parietal lobule gyrus (series 11, image 38 versus also series 11, image 38 on prior). T2 FLAIR hyperintensity in the left parietal lobe has more definitively increased compared to the December 2021 study. No acute infarction.  Ventricles are stable in size. Vascular: Major vessel flow voids at the skull base are preserved. Skull and upper cervical spine: Normal marrow signal is preserved. Sinuses/Orbits: Paranasal sinuses are aerated. Orbits are unremarkable. Other: Sella is unremarkable.  Mastoid air cells are clear. IMPRESSION: No new enhancement. No substantial change in extent of abnormal T2 FLAIR hyperintensity. Question of minimal increase in the left parietal lobe (more definitively increased compared to December 2021). May reflect progression of infiltrating tumor; continued attention on follow-up. Electronically Signed   By: PMacy MisM.D.   On: 04/26/2020 15:07   Assessment/Plan Glioblastoma with isocitrate dehydrogenase gene wildtype ([place] [C71.9]   RSIDNEY SILBERMANis clinically stable today, now having complete 4 cycles of CCNU+avastin. MRI demonstrates stability of enhancing disease, very subtle increase in T2/FLAIR burden is likely secondary to leukomalacia.   She is cleared to dose avastin today.  We will hold off on further CCNU/chemo given progressive thromobcytopenia.  With no chemo, she will not require neulasta injection Thursday.  Avastin should be held for the following:  ANC less than 500  Platelets less than 50,000  LFT or creatinine greater than 2x ULN  If clinical concerns/contraindications develop  We ask that RDONA WALBYreturn to clinic in 2 weeks  with labs for review prior to avastin infusion.  Next MRI can be in 8 weeks.  All questions were answered. The patient knows to call the clinic with any problems, questions or concerns. No barriers to learning were detected.  I have spent a total of 40 minutes of face-to-face and non-face-to-face time, excluding clinical staff time, preparing to see patient, ordering tests and/or medications, counseling the patient, and independently interpreting results and communicating results to the patient/family/caregiver    ZVentura Sellers MD Medical Director of Neuro-Oncology [facility]04/26/22 3:55 PM

## 2020-04-30 NOTE — Patient Instructions (Signed)
[facility]  Discharge Instructions: Thank you for choosing [facility] to provide your oncology and hematology care.   If you have a lab appointment with the [facility], please go directly to the [facility] and check in at the registration area.   Wear comfortable clothing and clothing appropriate for easy access to any Portacath or PICC line.   We strive to give you quality time with your provider. You may need to reschedule your appointment if you arrive late (15 or more minutes).  Arriving late affects you and other patients whose appointments are after yours.  Also, if you miss three or more appointments without notifying the office, you may be dismissed from the clinic at the provider's discretion.      For prescription refill requests, have your pharmacy contact our office and allow 72 hours for refills to be completed.    Today you received the following chemotherapy and/or immunotherapy agents Avastin       To help prevent nausea and vomiting after your treatment, we encourage you to take your nausea medication as directed.  BELOW ARE SYMPTOMS THAT SHOULD BE REPORTED IMMEDIATELY: *FEVER GREATER THAN 100.4 F (38 C) OR HIGHER *CHILLS OR SWEATING *NAUSEA AND VOMITING THAT IS NOT CONTROLLED WITH YOUR NAUSEA MEDICATION *UNUSUAL SHORTNESS OF BREATH *UNUSUAL BRUISING OR BLEEDING *URINARY PROBLEMS (pain or burning when urinating, or frequent urination) *BOWEL PROBLEMS (unusual diarrhea, constipation, pain near the anus) TENDERNESS IN MOUTH AND THROAT WITH OR WITHOUT PRESENCE OF ULCERS (sore throat, sores in mouth, or a toothache) UNUSUAL RASH, SWELLING OR PAIN  UNUSUAL VAGINAL DISCHARGE OR ITCHING   Items with * indicate a potential emergency and should be followed up as soon as possible or go to the Emergency Department if any problems should occur.  Please show the CHEMOTHERAPY ALERT CARD or IMMUNOTHERAPY ALERT CARD at check-in to  the Emergency Department and triage nurse.  Should you have questions after your visit or need to cancel or reschedule your appointment, please contact [facility]  Dept: [phone]  and follow the prompts.  Office hours are 8:00 a.m. to 4:30 p.m. Monday - Friday. Please note that voicemails left after 4:00 p.m. may not be returned until the following business day.  We are closed weekends and major holidays. You have access to a nurse at all times for urgent questions. Please call the main number to the clinic Dept: [phone] and follow the prompts.   For any non-urgent questions, you may also contact your provider using MyChart. We now offer e-Visits for anyone 18 and older to request care online for non-urgent symptoms. For details visit [email or web address].   Also download the MyChart app! Go to the app store, search "MyChart", open the app, select [facility], and log in with your MyChart username and password.  Due to Covid, a mask is required upon entering the hospital/clinic. If you do not have a mask, one will be given to you upon arrival. For doctor visits, patients may have 1 support person aged 18 or older with them. For treatment visits, patients cannot have anyone with them due to current Covid guidelines and our immunocompromised population.   

## 2020-05-01 ENCOUNTER — Ambulatory Visit: Payer: BC Managed Care – PPO

## 2020-05-01 ENCOUNTER — Other Ambulatory Visit (HOSPITAL_COMMUNITY): Payer: Self-pay

## 2020-05-02 ENCOUNTER — Inpatient Hospital Stay: Payer: BC Managed Care – PPO

## 2020-05-02 ENCOUNTER — Telehealth: Payer: Self-pay | Admitting: Internal Medicine

## 2020-05-02 NOTE — Telephone Encounter (Signed)
Scheduled follow-up appointment per 4/26 los. Patient is aware. 

## 2020-05-14 ENCOUNTER — Inpatient Hospital Stay (HOSPITAL_BASED_OUTPATIENT_CLINIC_OR_DEPARTMENT_OTHER): Payer: BC Managed Care – PPO | Admitting: Internal Medicine

## 2020-05-14 ENCOUNTER — Inpatient Hospital Stay: Payer: BC Managed Care – PPO

## 2020-05-14 ENCOUNTER — Inpatient Hospital Stay: Payer: BC Managed Care – PPO | Attending: Internal Medicine

## 2020-05-14 ENCOUNTER — Other Ambulatory Visit: Payer: Self-pay

## 2020-05-14 VITALS — BP 118/66 | HR 79 | Resp 18

## 2020-05-14 VITALS — BP 138/74 | HR 87 | Temp 97.2°F | Resp 18 | Ht 62.0 in | Wt 102.6 lb

## 2020-05-14 DIAGNOSIS — C714 Malignant neoplasm of occipital lobe: Secondary | ICD-10-CM | POA: Insufficient documentation

## 2020-05-14 DIAGNOSIS — D6959 Other secondary thrombocytopenia: Secondary | ICD-10-CM | POA: Insufficient documentation

## 2020-05-14 DIAGNOSIS — E039 Hypothyroidism, unspecified: Secondary | ICD-10-CM | POA: Insufficient documentation

## 2020-05-14 DIAGNOSIS — Z923 Personal history of irradiation: Secondary | ICD-10-CM | POA: Diagnosis not present

## 2020-05-14 DIAGNOSIS — C719 Malignant neoplasm of brain, unspecified: Secondary | ICD-10-CM

## 2020-05-14 DIAGNOSIS — Z9221 Personal history of antineoplastic chemotherapy: Secondary | ICD-10-CM | POA: Diagnosis not present

## 2020-05-14 DIAGNOSIS — Z79899 Other long term (current) drug therapy: Secondary | ICD-10-CM | POA: Insufficient documentation

## 2020-05-14 DIAGNOSIS — Z5112 Encounter for antineoplastic immunotherapy: Secondary | ICD-10-CM | POA: Insufficient documentation

## 2020-05-14 LAB — CMP (CANCER CENTER ONLY)
ALT: 42 U/L (ref 0–44)
AST: 47 U/L — ABNORMAL HIGH (ref 15–41)
Albumin: 3.3 g/dL — ABNORMAL LOW (ref 3.5–5.0)
Alkaline Phosphatase: 282 U/L — ABNORMAL HIGH (ref 38–126)
Anion gap: 10 (ref 5–15)
BUN: 21 mg/dL (ref 8–23)
CO2: 25 mmol/L (ref 22–32)
Calcium: 8.8 mg/dL — ABNORMAL LOW (ref 8.9–10.3)
Chloride: 107 mmol/L (ref 98–111)
Creatinine: 0.87 mg/dL (ref 0.44–1.00)
GFR, Estimated: >60 mL/min (ref >60)
Glucose, Bld: 101 mg/dL — ABNORMAL HIGH (ref 70–99)
Potassium: 3.7 mmol/L (ref 3.5–5.1)
Sodium: 142 mmol/L (ref 135–145)
Total Bilirubin: 0.5 mg/dL (ref 0.3–1.2)
Total Protein: 6.3 g/dL — ABNORMAL LOW (ref 6.5–8.1)

## 2020-05-14 LAB — CBC WITH DIFFERENTIAL (CANCER CENTER ONLY)
Abs Immature Granulocytes: 0.01 10*3/uL (ref 0.00–0.07)
Basophils Absolute: 0 10*3/uL (ref 0.0–0.1)
Basophils Relative: 0 %
Eosinophils Absolute: 0.2 10*3/uL (ref 0.0–0.5)
Eosinophils Relative: 5 %
HCT: 33.5 % — ABNORMAL LOW (ref 36.0–46.0)
Hemoglobin: 11.2 g/dL — ABNORMAL LOW (ref 12.0–15.0)
Immature Granulocytes: 0 %
Lymphocytes Relative: 21 %
Lymphs Abs: 0.8 10*3/uL (ref 0.7–4.0)
MCH: 34 pg (ref 26.0–34.0)
MCHC: 33.4 g/dL (ref 30.0–36.0)
MCV: 101.8 fL — ABNORMAL HIGH (ref 80.0–100.0)
Monocytes Absolute: 0.5 10*3/uL (ref 0.1–1.0)
Monocytes Relative: 14 %
Neutro Abs: 2.1 10*3/uL (ref 1.7–7.7)
Neutrophils Relative %: 60 %
Platelet Count: 82 10*3/uL — ABNORMAL LOW (ref 150–400)
RBC: 3.29 MIL/uL — ABNORMAL LOW (ref 3.87–5.11)
RDW: 13.9 % (ref 11.5–15.5)
WBC Count: 3.6 10*3/uL — ABNORMAL LOW (ref 4.0–10.5)
nRBC: 0 % (ref 0.0–0.2)

## 2020-05-14 LAB — TOTAL PROTEIN, URINE DIPSTICK: Protein, ur: 300 mg/dL — AB

## 2020-05-14 MED ORDER — SODIUM CHLORIDE 0.9 % IV SOLN
10.0000 mg/kg | Freq: Once | INTRAVENOUS | Status: AC
  Administered 2020-05-14: 500 mg via INTRAVENOUS
  Filled 2020-05-14: qty 16

## 2020-05-14 MED ORDER — SODIUM CHLORIDE 0.9 % IV SOLN
Freq: Once | INTRAVENOUS | Status: AC
Start: 2020-05-14 — End: 2020-05-14
  Filled 2020-05-14: qty 250

## 2020-05-14 NOTE — Progress Notes (Signed)
  [facility] [address]  [place], [place] [address] [phone]   Interval Evaluation  Date of Service: 05/14/20 Patient Name: [facility] Patient MRN: [id number] Patient DOB: 08/03/1955 Provider: Zachary K Vaslow, MD  Identifying Statement:  [facility] is a 65 y.o. 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 Nundkumar.  Path is GBM IDH-wt   01/23/2019 - 03/03/2019 Radiation Therapy   IMRT with concurrent Temozolomide 75mg/m2   03/28/2019 - 09/04/2019 Chemotherapy   5 cycles of adjuvant 5-day Temozolomide 200mg/m2    09/01/2019 Progression   Progression of disease #1   09/19/2019 -  Chemotherapy   Begins second line therapy with oral CCNU 90mg/m2 q6 weeks, concurrent Avastin 10mg/kg IV q2 weeks     Biomarkers:  MGMT Unknown.  IDH 1/2 Wild type.  EGFR Unknown  TERT Unknown   Interval History:  [facility] presents today for evaluation and avastin infusion. No new or progressive deficits.  No recurrence in abdominal symptoms.  Continues to have deficits with reading and right sided vision, no clear change from prior.  No right sided weakness. No seizures or headaches.  H+P (01/09/19) Patient presented to medical attention in early December with ~2 months history of right sided visual impairment.  She describes fuzzy or blurry vision on that side which was progressive over time, leading to at least one fall.  MRI brain demonstrated enhancing left occipital mass; this was subsequently resected by Dr. Nundkumar on 12/23/18.  She had no issues with surgery and has completed her steroid taper.  Continues on keppra daily but no history of any seizure. She has returned to work with only modest difficutly.  No issues walking or performing ADLs; lives alone but her sister and  other family members are nearby.  Medications: Current Outpatient Medications on File Prior to Visit  Medication Sig Dispense Refill  . Biotin 1 MG CAPS Take 1 mg by mouth daily.    . CALCIUM PO Take 1 tablet by mouth daily.    . EUTHYROX 50 MCG tablet Take 50 mcg by mouth daily.    . naproxen sodium (ALEVE) 220 MG tablet Take 1 tablet (220 mg total) by mouth daily as needed (pain).    . omeprazole (PRILOSEC) 20 MG capsule Take 1 capsule (20 mg total) by mouth daily. 30 capsule 3  . ondansetron (ZOFRAN) 8 MG tablet Take 1 tablet (8 mg total) by mouth 2 (two) times daily as needed. Start on the third day after chemotherapy. 30 tablet 1  . doxycycline (VIBRA-TABS) 100 MG tablet Take 1 tablet by mouth daily. (Patient not taking: No sig reported)     No current facility-administered medications on file prior to visit.    Allergies:  Allergies  Allergen Reactions  . Etodolac Other (See Comments)    GI upset  . Macrobid [Nitrofurantoin] Rash   Past Medical History:  Past Medical History:  Diagnosis Date  . High cholesterol   . Hypothyroidism   . Joint pain   . Thrombocytopenia (HCC) 02/26/2020   Past Surgical History:  Past Surgical History:  Procedure Laterality Date  . APPLICATION OF CRANIAL NAVIGATION Left 12/23/2018   Procedure: APPLICATION OF CRANIAL NAVIGATION;  Surgeon: Nundkumar, Neelesh, MD;  Location: [facility];  Service: Neurosurgery;  Laterality: Left;  APPLICATION OF CRANIAL NAVIGATION  . BREAST   BIOPSY Left   . COLONOSCOPY    . CRANIOTOMY Left 12/23/2018   Procedure: STEREOTACTIC LEFT OCCIPITAL CRANIOTOMY FOR RESECTION OF TUMOR;  Surgeon: Nundkumar, Neelesh, MD;  Location: [facility];  Service: Neurosurgery;  Laterality: Left;  STEREOTACTIC LEFT OCCIPITAL CRANIOTOMY FOR RESECTION OF TUMOR  . LASIK     Social History:  Social History   Socioeconomic History  . Marital status: Single    Spouse name: Not on file  . Number of children: Not on file  . Years of education: Not  on file  . Highest education level: Not on file  Occupational History  . Not on file  Tobacco Use  . Smoking status: Never Smoker  . Smokeless tobacco: Never Used  Vaping Use  . Vaping Use: Never used  Substance and Sexual Activity  . Alcohol use: Not Currently  . Drug use: Never  . Sexual activity: Not on file  Other Topics Concern  . Not on file  Social History Narrative  . Not on file   Social Determinants of Health   Financial Resource Strain: Not on file  Food Insecurity: Not on file  Transportation Needs: Not on file  Physical Activity: Not on file  Stress: Not on file  Social Connections: Not on file  Intimate Partner Violence: Not on file   Family History: No family history on file.  Review of Systems: Constitutional: Denies fevers, chills or abnormal weight loss Eyes: Denies blurriness of vision Gastrointestinal:  Denies nausea, constipation, diarrhea GU: Denies dysuria or incontinence Skin: Denies abnormal skin rashes Musculoskeletal: Denies joint pain, back or neck discomfort.  Behavioral/Psych: Denies anxiety, mood instability  Physical Exam: Vitals:   05/14/20 1357  BP: 138/74  Pulse: 87  Resp: 18  Temp: (!) 97.2 F (36.2 C)  SpO2: 100%   KPS: 80. General: Alert, cooperative, pleasant, in no acute distress Head: Craniotomy scar noted, dry and intact. EENT: No conjunctival injection or scleral icterus. Oral mucosa moist Lungs: Resp effort normal Cardiac: Regular rate and rhythm Abdomen: Soft, non-distended abdomen Skin: No rashes cyanosis or petechiae. Extremities: No clubbing or edema  Neurologic Exam: Mental Status: Awake, alert, attentive to examiner. Oriented to self and environment. Language is notable for mild transcortical expressive dyshpasia. Cranial Nerves: Visual acuity is grossly normal. Right homonymous hemianopia. Extra-ocular movements intact. No ptosis. Face is symmetric, tongue midline. Motor: Tone and bulk are normal. Power is  full in both arms and legs. Reflexes are symmetric, no pathologic reflexes present. Intact finger to nose bilaterally Sensory: Intact to light touch and temperature Gait: Normal and tandem gait is deferred.   Labs: I have reviewed the data as listed    Component Value Date/Time   NA 142 05/14/2020 1342   K 3.7 05/14/2020 1342   CL 107 05/14/2020 1342   CO2 25 05/14/2020 1342   GLUCOSE 101 (H) 05/14/2020 1342   BUN 21 05/14/2020 1342   CREATININE 0.87 05/14/2020 1342   CALCIUM 8.8 (L) 05/14/2020 1342   PROT 6.3 (L) 05/14/2020 1342   ALBUMIN 3.3 (L) 05/14/2020 1342   AST 47 (H) 05/14/2020 1342   ALT 42 05/14/2020 1342   ALKPHOS 282 (H) 05/14/2020 1342   BILITOT 0.5 05/14/2020 1342   GFRNONAA >60 05/14/2020 1342   GFRAA >60 10/03/2019 1133   Lab Results  Component Value Date   WBC 3.6 (L) 05/14/2020   NEUTROABS 2.1 05/14/2020   HGB 11.2 (L) 05/14/2020   HCT 33.5 (L) 05/14/2020   MCV 101.8 (H) 05/14/2020     PLT 82 (L) 05/14/2020   Assessment/Plan Glioblastoma with isocitrate dehydrogenase gene wildtype ([facility]) [C71.9]   [facility] is clinically stable today.  Labs demonstrate proteinuria and thrombocytopenia (improved).  She is cleared to dose avastin today, as proteinuria is at threshold of "safe to treat".  We will continue to hold off on further CCNU/chemo given progressive thromobcytopenia.    Avastin should be held for the following:  ANC less than 500  Platelets less than 50,000  LFT or creatinine greater than 2x ULN  If clinical concerns/contraindications develop  We ask that [facility] return to clinic in 2 weeks with labs for review prior to avastin infusion.  Next MRI can be in 6 weeks.  All questions were answered. The patient knows to call the clinic with any problems, questions or concerns. No barriers to learning were detected.  I have spent a total of 30 minutes of face-to-face and non-face-to-face time, excluding clinical staff time, preparing  to see patient, ordering tests and/or medications, counseling the patient, and independently interpreting results and communicating results to the patient/family/caregiver    Ventura Sellers, MD Medical Director of Neuro-Oncology [facility] 05/14/20 2:25 PM

## 2020-05-14 NOTE — Progress Notes (Signed)
Okay to proceed with treatment with urine protein 300 per Dr. Mickeal Skinner

## 2020-05-22 ENCOUNTER — Other Ambulatory Visit (HOSPITAL_COMMUNITY): Payer: Self-pay

## 2020-05-28 ENCOUNTER — Inpatient Hospital Stay (HOSPITAL_BASED_OUTPATIENT_CLINIC_OR_DEPARTMENT_OTHER): Payer: BC Managed Care – PPO | Admitting: Internal Medicine

## 2020-05-28 ENCOUNTER — Inpatient Hospital Stay: Payer: BC Managed Care – PPO

## 2020-05-28 ENCOUNTER — Other Ambulatory Visit: Payer: Self-pay

## 2020-05-28 ENCOUNTER — Ambulatory Visit: Payer: BC Managed Care – PPO

## 2020-05-28 VITALS — BP 137/81 | HR 84 | Temp 97.8°F | Resp 18 | Wt 99.7 lb

## 2020-05-28 DIAGNOSIS — D696 Thrombocytopenia, unspecified: Secondary | ICD-10-CM

## 2020-05-28 DIAGNOSIS — D6959 Other secondary thrombocytopenia: Secondary | ICD-10-CM | POA: Diagnosis not present

## 2020-05-28 DIAGNOSIS — Z79899 Other long term (current) drug therapy: Secondary | ICD-10-CM | POA: Diagnosis not present

## 2020-05-28 DIAGNOSIS — Z9221 Personal history of antineoplastic chemotherapy: Secondary | ICD-10-CM | POA: Diagnosis not present

## 2020-05-28 DIAGNOSIS — Z5112 Encounter for antineoplastic immunotherapy: Secondary | ICD-10-CM | POA: Diagnosis not present

## 2020-05-28 DIAGNOSIS — Z923 Personal history of irradiation: Secondary | ICD-10-CM | POA: Diagnosis not present

## 2020-05-28 DIAGNOSIS — C719 Malignant neoplasm of brain, unspecified: Secondary | ICD-10-CM

## 2020-05-28 DIAGNOSIS — C714 Malignant neoplasm of occipital lobe: Secondary | ICD-10-CM | POA: Diagnosis not present

## 2020-05-28 DIAGNOSIS — E039 Hypothyroidism, unspecified: Secondary | ICD-10-CM | POA: Diagnosis not present

## 2020-05-28 LAB — CMP (CANCER CENTER ONLY)
ALT: 62 U/L — ABNORMAL HIGH (ref 0–44)
AST: 80 U/L — ABNORMAL HIGH (ref 15–41)
Albumin: 3.4 g/dL — ABNORMAL LOW (ref 3.5–5.0)
Alkaline Phosphatase: 304 U/L — ABNORMAL HIGH (ref 38–126)
Anion gap: 9 (ref 5–15)
BUN: 19 mg/dL (ref 8–23)
CO2: 24 mmol/L (ref 22–32)
Calcium: 9.3 mg/dL (ref 8.9–10.3)
Chloride: 106 mmol/L (ref 98–111)
Creatinine: 0.89 mg/dL (ref 0.44–1.00)
GFR, Estimated: >60 mL/min (ref >60)
Glucose, Bld: 91 mg/dL (ref 70–99)
Potassium: 4.1 mmol/L (ref 3.5–5.1)
Sodium: 139 mmol/L (ref 135–145)
Total Bilirubin: 0.5 mg/dL (ref 0.3–1.2)
Total Protein: 6.9 g/dL (ref 6.5–8.1)

## 2020-05-28 LAB — TOTAL PROTEIN, URINE DIPSTICK: Protein, ur: 100 mg/dL — AB

## 2020-05-28 LAB — VITAMIN B12: Vitamin B-12: 389 pg/mL (ref 180–914)

## 2020-05-28 LAB — CBC WITH DIFFERENTIAL (CANCER CENTER ONLY)
Abs Immature Granulocytes: 0.01 10*3/uL (ref 0.00–0.07)
Basophils Absolute: 0.1 10*3/uL (ref 0.0–0.1)
Basophils Relative: 1 %
Eosinophils Absolute: 0.5 10*3/uL (ref 0.0–0.5)
Eosinophils Relative: 9 %
HCT: 39.2 % (ref 36.0–46.0)
Hemoglobin: 12.7 g/dL (ref 12.0–15.0)
Immature Granulocytes: 0 %
Lymphocytes Relative: 19 %
Lymphs Abs: 1 10*3/uL (ref 0.7–4.0)
MCH: 33.3 pg (ref 26.0–34.0)
MCHC: 32.4 g/dL (ref 30.0–36.0)
MCV: 102.9 fL — ABNORMAL HIGH (ref 80.0–100.0)
Monocytes Absolute: 0.6 10*3/uL (ref 0.1–1.0)
Monocytes Relative: 11 %
Neutro Abs: 3.2 10*3/uL (ref 1.7–7.7)
Neutrophils Relative %: 60 %
Platelet Count: 126 10*3/uL — ABNORMAL LOW (ref 150–400)
RBC: 3.81 MIL/uL — ABNORMAL LOW (ref 3.87–5.11)
RDW: 13.2 % (ref 11.5–15.5)
WBC Count: 5.4 10*3/uL (ref 4.0–10.5)
nRBC: 0 % (ref 0.0–0.2)

## 2020-05-28 MED ORDER — SODIUM CHLORIDE 0.9 % IV SOLN
10.0000 mg/kg | Freq: Once | INTRAVENOUS | Status: AC
  Administered 2020-05-28: 500 mg via INTRAVENOUS
  Filled 2020-05-28: qty 16

## 2020-05-28 MED ORDER — SODIUM CHLORIDE 0.9 % IV SOLN
Freq: Once | INTRAVENOUS | Status: AC
Start: 2020-05-28 — End: 2020-05-28
  Filled 2020-05-28: qty 250

## 2020-05-28 NOTE — Patient Instructions (Signed)
[facility]  Discharge Instructions: Thank you for choosing [facility] to provide your oncology and hematology care.   If you have a lab appointment with the [facility], please go directly to the [facility] and check in at the registration area.   Wear comfortable clothing and clothing appropriate for easy access to any Portacath or PICC line.   We strive to give you quality time with your provider. You may need to reschedule your appointment if you arrive late (15 or more minutes).  Arriving late affects you and other patients whose appointments are after yours.  Also, if you miss three or more appointments without notifying the office, you may be dismissed from the clinic at the provider's discretion.      For prescription refill requests, have your pharmacy contact our office and allow 72 hours for refills to be completed.    Today you received the following chemotherapy and/or immunotherapy agent: Bevacizumab (Avastin)      To help prevent nausea and vomiting after your treatment, we encourage you to take your nausea medication as directed.  BELOW ARE SYMPTOMS THAT SHOULD BE REPORTED IMMEDIATELY: . *FEVER GREATER THAN 100.4 F (38 C) OR HIGHER . *CHILLS OR SWEATING . *NAUSEA AND VOMITING THAT IS NOT CONTROLLED WITH YOUR NAUSEA MEDICATION . *UNUSUAL SHORTNESS OF BREATH . *UNUSUAL BRUISING OR BLEEDING . *URINARY PROBLEMS (pain or burning when urinating, or frequent urination) . *BOWEL PROBLEMS (unusual diarrhea, constipation, pain near the anus) . TENDERNESS IN MOUTH AND THROAT WITH OR WITHOUT PRESENCE OF ULCERS (sore throat, sores in mouth, or a toothache) . UNUSUAL RASH, SWELLING OR PAIN  . UNUSUAL VAGINAL DISCHARGE OR ITCHING   Items with * indicate a potential emergency and should be followed up as soon as possible or go to the Emergency Department if any problems should occur.  Please show the CHEMOTHERAPY ALERT CARD or  IMMUNOTHERAPY ALERT CARD at check-in to the Emergency Department and triage nurse.  Should you have questions after your visit or need to cancel or reschedule your appointment, please contact [facility]  Dept: [phone]  and follow the prompts.  Office hours are 8:00 a.m. to 4:30 p.m. Monday - Friday. Please note that voicemails left after 4:00 p.m. may not be returned until the following business day.  We are closed weekends and major holidays. You have access to a nurse at all times for urgent questions. Please call the main number to the clinic Dept: [phone] and follow the prompts.   For any non-urgent questions, you may also contact your provider using MyChart. We now offer e-Visits for anyone 11 and older to request care online for non-urgent symptoms. For details visit [email or web address].   Also download the MyChart app! Go to the app store, search "MyChart", open the app, select [facility], and log in with your MyChart username and password.  Due to Covid, a mask is required upon entering the hospital/clinic. If you do not have a mask, one will be given to you upon arrival. For doctor visits, patients may have 1 support person aged 64 or older with them. For treatment visits, patients cannot have anyone with them due to current Covid guidelines and our immunocompromised population.

## 2020-05-28 NOTE — Progress Notes (Signed)
[facility] [place], [place] [address] [phone]   Interval Evaluation  Date of Service: 05/28/20 Patient Name: [facility] Patient MRN: [id number] Patient DOB: April 20, 1955 Provider: Ventura Sellers, MD  Identifying Statement:  [facility] is a 65 y.o. female with left occipital glioblastoma   Oncologic History: Oncology History  Glioblastoma with isocitrate dehydrogenase gene wildtype ([facility])  12/23/2018 Surgery   Craniotomy, left occipital resection by Dr. Kathyrn Sheriff.  Path is GBM IDH-wt   01/23/2019 - 03/03/2019 Radiation Therapy   IMRT with concurrent Temozolomide 76m/m2   03/28/2019 - 09/04/2019 Chemotherapy   5 cycles of adjuvant 5-day Temozolomide 2019mm2    09/01/2019 Progression   Progression of disease #1   09/19/2019 -  Chemotherapy   Begins second line therapy with oral CCNU 9024m2 q6 weeks, concurrent Avastin 58m67m IV q2 weeks     Biomarkers:  MGMT Unknown.  IDH 1/2 Wild type.  EGFR Unknown  TERT Unknown   Interval History:  [facility] today for evaluation and avastin infusion. She denies new or progressive neurologic deficits today.  Continues to have deficits with reading and right sided vision, no clear change from prior.  No right sided weakness. No seizures or headaches.  H+P (01/09/19) Patient presented to medical attention in early December with ~2 months history of right sided visual impairment.  She describes fuzzy or blurry vision on that side which was progressive over time, leading to at least one fall.  MRI brain demonstrated enhancing left occipital mass; this was subsequently resected by Dr. NundKathyrn Sheriff12/18/20.  She had no issues with surgery and has completed her steroid taper.  Continues on keppra daily but no history of any seizure. She has returned to work with only modest difficutly.  No issues walking or performing ADLs; lives alone but her sister and other family  members are nearby.  Medications: Current Outpatient Medications on File Prior to Visit  Medication Sig Dispense Refill  . Biotin 1 MG CAPS Take 1 mg by mouth daily.    . CAMarland KitchenCIUM PO Take 1 tablet by mouth daily.    . doMarland Kitchenycycline (VIBRA-TABS) 100 MG tablet Take 1 tablet by mouth daily. (Patient not taking: No sig reported)    . EUTHYROX 50 MCG tablet Take 50 mcg by mouth daily.    . naproxen sodium (ALEVE) 220 MG tablet Take 1 tablet (220 mg total) by mouth daily as needed (pain).    . omMarland Kitchenprazole (PRILOSEC) 20 MG capsule Take 1 capsule (20 mg total) by mouth daily. 30 capsule 3  . ondansetron (ZOFRAN) 8 MG tablet Take 1 tablet (8 mg total) by mouth 2 (two) times daily as needed. Start on the third day after chemotherapy. 30 tablet 1   No current facility-administered medications on file prior to visit.    Allergies:  Allergies  Allergen Reactions  . Etodolac Other (See Comments)    GI upset  . Macrobid [Nitrofurantoin] Rash   Past Medical History:  Past Medical History:  Diagnosis Date  . High cholesterol   . Hypothyroidism   . Joint pain   . Thrombocytopenia (HCC)[place]21/2022   Past Surgical History:  Past Surgical History:  Procedure Laterality Date  . APPLICATION OF CRANIAL NAVIGATION Left 12/23/2018   Procedure: APPLICATION OF CRANIAL NAVIGATION;  Surgeon: NundConsuella Lose;  [facility];  Laterality: Left;  APPLICATION OF CRANIAL NAVIGATION  . BREAST BIOPSY Left   .  COLONOSCOPY    . CRANIOTOMY Left 12/23/2018   Procedure: STEREOTACTIC LEFT OCCIPITAL CRANIOTOMY FOR RESECTION OF TUMOR;  Surgeon: Consuella Lose, MD;  Location: [facility];  Service: Neurosurgery;  Laterality: Left;  STEREOTACTIC LEFT OCCIPITAL CRANIOTOMY FOR RESECTION OF TUMOR  . LASIK     Social History:  Social History   Socioeconomic History  . Marital status: Single    Spouse name: Not on file  . Number of children: Not on file  . Years of education: Not on file  .  Highest education level: Not on file  Occupational History  . Not on file  Tobacco Use  . Smoking status: Never Smoker  . Smokeless tobacco: Never Used  Vaping Use  . Vaping Use: Never used  Substance and Sexual Activity  . Alcohol use: Not Currently  . Drug use: Never  . Sexual activity: Not on file  Other Topics Concern  . Not on file  Social History Narrative  . Not on file   Social Determinants of Health   Financial Resource Strain: Not on file  Food Insecurity: Not on file  Transportation Needs: Not on file  Physical Activity: Not on file  Stress: Not on file  Social Connections: Not on file  Intimate Partner Violence: Not on file   Family History: No family history on file.  Review of Systems: Constitutional: Denies fevers, chills or abnormal weight loss Eyes: Denies blurriness of vision Gastrointestinal:  Denies nausea, constipation, diarrhea GU: Denies dysuria or incontinence Skin: Denies abnormal skin rashes Musculoskeletal: Denies joint pain, back or neck discomfort.  Behavioral/Psych: Denies anxiety, mood instability  Physical Exam: Vitals:   05/28/20 1236  BP: 137/81  Pulse: 84  Resp: 18  Temp: 97.8 F (36.6 C)  SpO2: 100%   KPS: 80. General: Alert, cooperative, pleasant, in no acute distress Head: Craniotomy scar noted, dry and intact. EENT: No conjunctival injection or scleral icterus. Oral mucosa moist Lungs: Resp effort normal Cardiac: Regular rate and rhythm Abdomen: Soft, non-distended abdomen Skin: No rashes cyanosis or petechiae. Extremities: No clubbing or edema  Neurologic Exam: Mental Status: Awake, alert, attentive to examiner. Oriented to self and environment. Language is notable for mild transcortical expressive dyshpasia. Cranial Nerves: Visual acuity is grossly normal. Right homonymous hemianopia. Extra-ocular movements intact. No ptosis. Face is symmetric, tongue midline. Motor: Tone and bulk are normal. Power is full in both  arms and legs. Reflexes are symmetric, no pathologic reflexes present. Intact finger to nose bilaterally Sensory: Intact to light touch and temperature Gait: Normal and tandem gait is deferred.   Labs: I have reviewed the data as listed    Component Value Date/Time   NA 139 05/28/2020 1207   K 4.1 05/28/2020 1207   CL 106 05/28/2020 1207   CO2 24 05/28/2020 1207   GLUCOSE 91 05/28/2020 1207   BUN 19 05/28/2020 1207   CREATININE 0.89 05/28/2020 1207   CALCIUM 9.3 05/28/2020 1207   PROT 6.9 05/28/2020 1207   ALBUMIN 3.4 (L) 05/28/2020 1207   AST 80 (H) 05/28/2020 1207   ALT 62 (H) 05/28/2020 1207   ALKPHOS 304 (H) 05/28/2020 1207   BILITOT 0.5 05/28/2020 1207   GFRNONAA >60 05/28/2020 1207   GFRAA >60 10/03/2019 1133   Lab Results  Component Value Date   WBC 5.4 05/28/2020   NEUTROABS 3.2 05/28/2020   HGB 12.7 05/28/2020   HCT 39.2 05/28/2020   MCV 102.9 (H) 05/28/2020   PLT 126 (L) 05/28/2020   Assessment/Plan Glioblastoma, IDH-1  WT   J[facility] is clinically stable today.  Labs demonstrate improvement in proteinuria and thrombocytopenia from prior visit.  Liver enzymes continue to trend upwards modestly.  She is cleared to dose avastin today. We will continue to hold off on further CCNU/chemo given progressive thromobcytopenia.    Avastin should be held for the following:  ANC less than 500  Platelets less than 50,000  LFT or creatinine greater than 2x ULN  If clinical concerns/contraindications develop  We ask that Jillian Gomez return to clinic in 2 weeks with labs for review prior to avastin infusion.  Next MRI can be in 4 weeks.  All questions were answered. The patient knows to call the clinic with any problems, questions or concerns. No barriers to learning were detected.  I have spent a total of 30 minutes of face-to-face and non-face-to-face time, excluding clinical staff time, preparing to see patient, ordering tests and/or medications, counseling  the patient, and independently interpreting results and communicating results to the patient/family/caregiver    Ventura Sellers, MD Medical Director of Neuro-Oncology [facility] 05/28/20 12:59 PM

## 2020-05-28 NOTE — Progress Notes (Signed)
Per Dr. Mickeal Skinner, no B12 injection today.

## 2020-06-06 ENCOUNTER — Other Ambulatory Visit: Payer: Self-pay | Admitting: Radiation Therapy

## 2020-06-07 ENCOUNTER — Telehealth: Payer: Self-pay | Admitting: *Deleted

## 2020-06-07 NOTE — Telephone Encounter (Signed)
Received call from patient asking about her appts for next week. Per Dr. Mickeal Skinner 's LOS, she is due for labs and her Avastin around 06/11/20. High priority message sent.  Vm message left her to expect call from one of our schedulers.

## 2020-06-11 ENCOUNTER — Inpatient Hospital Stay: Payer: BC Managed Care – PPO

## 2020-06-11 ENCOUNTER — Ambulatory Visit: Payer: BC Managed Care – PPO | Admitting: Internal Medicine

## 2020-06-11 ENCOUNTER — Other Ambulatory Visit: Payer: Self-pay

## 2020-06-11 ENCOUNTER — Inpatient Hospital Stay: Payer: BC Managed Care – PPO | Attending: Internal Medicine | Admitting: Internal Medicine

## 2020-06-11 VITALS — BP 114/76 | HR 75 | Temp 97.4°F | Resp 17 | Wt 99.6 lb

## 2020-06-11 DIAGNOSIS — E039 Hypothyroidism, unspecified: Secondary | ICD-10-CM | POA: Diagnosis not present

## 2020-06-11 DIAGNOSIS — C719 Malignant neoplasm of brain, unspecified: Secondary | ICD-10-CM | POA: Diagnosis not present

## 2020-06-11 DIAGNOSIS — E7800 Pure hypercholesterolemia, unspecified: Secondary | ICD-10-CM | POA: Insufficient documentation

## 2020-06-11 DIAGNOSIS — Z923 Personal history of irradiation: Secondary | ICD-10-CM | POA: Diagnosis not present

## 2020-06-11 DIAGNOSIS — Z5112 Encounter for antineoplastic immunotherapy: Secondary | ICD-10-CM | POA: Diagnosis not present

## 2020-06-11 DIAGNOSIS — C714 Malignant neoplasm of occipital lobe: Secondary | ICD-10-CM | POA: Insufficient documentation

## 2020-06-11 DIAGNOSIS — Z79899 Other long term (current) drug therapy: Secondary | ICD-10-CM | POA: Insufficient documentation

## 2020-06-11 LAB — CBC WITH DIFFERENTIAL (CANCER CENTER ONLY)
Abs Immature Granulocytes: 0.06 10*3/uL (ref 0.00–0.07)
Basophils Absolute: 0.1 10*3/uL (ref 0.0–0.1)
Basophils Relative: 1 %
Eosinophils Absolute: 0.4 10*3/uL (ref 0.0–0.5)
Eosinophils Relative: 8 %
HCT: 38.1 % (ref 36.0–46.0)
Hemoglobin: 12.6 g/dL (ref 12.0–15.0)
Immature Granulocytes: 1 %
Lymphocytes Relative: 21 %
Lymphs Abs: 1.1 10*3/uL (ref 0.7–4.0)
MCH: 33.3 pg (ref 26.0–34.0)
MCHC: 33.1 g/dL (ref 30.0–36.0)
MCV: 100.8 fL — ABNORMAL HIGH (ref 80.0–100.0)
Monocytes Absolute: 0.6 10*3/uL (ref 0.1–1.0)
Monocytes Relative: 11 %
Neutro Abs: 3.1 10*3/uL (ref 1.7–7.7)
Neutrophils Relative %: 58 %
Platelet Count: 116 10*3/uL — ABNORMAL LOW (ref 150–400)
RBC: 3.78 MIL/uL — ABNORMAL LOW (ref 3.87–5.11)
RDW: 12.5 % (ref 11.5–15.5)
WBC Count: 5.3 10*3/uL (ref 4.0–10.5)
nRBC: 0 % (ref 0.0–0.2)

## 2020-06-11 LAB — CMP (CANCER CENTER ONLY)
ALT: 36 U/L (ref 0–44)
AST: 46 U/L — ABNORMAL HIGH (ref 15–41)
Albumin: 3.5 g/dL (ref 3.5–5.0)
Alkaline Phosphatase: 209 U/L — ABNORMAL HIGH (ref 38–126)
Anion gap: 11 (ref 5–15)
BUN: 19 mg/dL (ref 8–23)
CO2: 23 mmol/L (ref 22–32)
Calcium: 9.4 mg/dL (ref 8.9–10.3)
Chloride: 107 mmol/L (ref 98–111)
Creatinine: 0.93 mg/dL (ref 0.44–1.00)
GFR, Estimated: >60 mL/min (ref >60)
Glucose, Bld: 86 mg/dL (ref 70–99)
Potassium: 4.1 mmol/L (ref 3.5–5.1)
Sodium: 141 mmol/L (ref 135–145)
Total Bilirubin: 0.4 mg/dL (ref 0.3–1.2)
Total Protein: 6.7 g/dL (ref 6.5–8.1)

## 2020-06-11 LAB — TOTAL PROTEIN, URINE DIPSTICK: Protein, ur: 100 mg/dL — AB

## 2020-06-11 MED ORDER — SODIUM CHLORIDE 0.9 % IV SOLN
Freq: Once | INTRAVENOUS | Status: AC
  Filled 2020-06-11: qty 250

## 2020-06-11 MED ORDER — SODIUM CHLORIDE 0.9 % IV SOLN
10.0000 mg/kg | Freq: Once | INTRAVENOUS | Status: AC
  Administered 2020-06-11: 500 mg via INTRAVENOUS
  Filled 2020-06-11: qty 16

## 2020-06-11 NOTE — Progress Notes (Signed)
[facility] [place], [place] [address] [phone]   Interval Evaluation  Date of Service: 06/11/20 Patient Name: Jillian Gomez Patient MRN: [id number] Patient DOB: [birth year] Provider: Ventura Sellers, MD  Identifying Statement:  SINCERITY CEDAR is a 65 y.o. female with left occipital glioblastoma   Oncologic History: Oncology History  Glioblastoma with isocitrate dehydrogenase gene wildtype ([facility])  12/23/2018 Surgery   Craniotomy, left occipital resection by Dr. Kathyrn Sheriff.  Path is GBM IDH-wt   01/23/2019 - 03/03/2019 Radiation Therapy   IMRT with concurrent Temozolomide 71m/m2   03/28/2019 - 09/04/2019 Chemotherapy   5 cycles of adjuvant 5-day Temozolomide 2058mm2    09/01/2019 Progression   Progression of disease #1   09/19/2019 -  Chemotherapy   Begins second line therapy with oral CCNU 9056m2 q6 weeks, concurrent Avastin 47m39m IV q2 weeks     Biomarkers:  MGMT Unknown.  IDH 1/2 Wild type.  EGFR Unknown  TERT Unknown   Interval History:  Jillian Gomez today for evaluation and avastin infusion. No new or progressive neurologic deficits.  Stable right sided vision, no clear change from prior.  No right sided weakness. No seizures or headaches.  H+P (01/09/19) Patient presented to medical attention in early December with ~2 months history of right sided visual impairment.  She describes fuzzy or blurry vision on that side which was progressive over time, leading to at least one fall.  MRI brain demonstrated enhancing left occipital mass; this was subsequently resected by Dr. NundKathyrn Sheriff12/18/20.  She had no issues with surgery and has completed her steroid taper.  Continues on keppra daily but no history of any seizure. She has returned to work with only modest difficutly.  No issues walking or performing ADLs; lives alone but her sister and other family members are nearby.  Medications: Current  Outpatient Medications on File Prior to Visit  Medication Sig Dispense Refill  . Biotin 1 MG CAPS Take 1 mg by mouth daily.    . CAMarland KitchenCIUM PO Take 1 tablet by mouth daily.    . doMarland Kitchenycycline (VIBRA-TABS) 100 MG tablet Take 1 tablet by mouth daily. (Patient not taking: No sig reported)    . EUTHYROX 50 MCG tablet Take 50 mcg by mouth daily.    . naproxen sodium (ALEVE) 220 MG tablet Take 1 tablet (220 mg total) by mouth daily as needed (pain).    . omMarland Kitchenprazole (PRILOSEC) 20 MG capsule Take 1 capsule (20 mg total) by mouth daily. 30 capsule 3  . ondansetron (ZOFRAN) 8 MG tablet Take 1 tablet (8 mg total) by mouth 2 (two) times daily as needed. Start on the third day after chemotherapy. 30 tablet 1   No current facility-administered medications on file prior to visit.    Allergies:  Allergies  Allergen Reactions  . Etodolac Other (See Comments)    GI upset  . Macrobid [Nitrofurantoin] Rash   Past Medical History:  Past Medical History:  Diagnosis Date  . High cholesterol   . Hypothyroidism   . Joint pain   . Thrombocytopenia (HCC)[place]21/2022   Past Surgical History:  Past Surgical History:  Procedure Laterality Date  . APPLICATION OF CRANIAL NAVIGATION Left 12/23/2018   Procedure: APPLICATION OF CRANIAL NAVIGATION;  Surgeon: NundConsuella Lose;  [facility] OValderservice: Neurosurgery;  Laterality: Left;  APPLICATION OF CRANIAL NAVIGATION  . BREAST BIOPSY Left   . COLONOSCOPY    . CRANIOTOMY  Left 12/23/2018   Procedure: STEREOTACTIC LEFT OCCIPITAL CRANIOTOMY FOR RESECTION OF TUMOR;  Surgeon: Consuella Lose, MD;  Location: [facility];  Service: Neurosurgery;  Laterality: Left;  STEREOTACTIC LEFT OCCIPITAL CRANIOTOMY FOR RESECTION OF TUMOR  . LASIK     Social History:  Social History   Socioeconomic History  . Marital status: Single    Spouse name: Not on file  . Number of children: Not on file  . Years of education: Not on file  . Highest education level: Not on file   Occupational History  . Not on file  Tobacco Use  . Smoking status: Never Smoker  . Smokeless tobacco: Never Used  Vaping Use  . Vaping Use: Never used  Substance and Sexual Activity  . Alcohol use: Not Currently  . Drug use: Never  . Sexual activity: Not on file  Other Topics Concern  . Not on file  Social History Narrative  . Not on file   Social Determinants of Health   Financial Resource Strain: Not on file  Food Insecurity: Not on file  Transportation Needs: Not on file  Physical Activity: Not on file  Stress: Not on file  Social Connections: Not on file  Intimate Partner Violence: Not on file   Family History: No family history on file.  Review of Systems: Constitutional: Denies fevers, chills or abnormal weight loss Eyes: Denies blurriness of vision Gastrointestinal:  Denies nausea, constipation, diarrhea GU: Denies dysuria or incontinence Skin: Denies abnormal skin rashes Musculoskeletal: Denies joint pain, back or neck discomfort.  Behavioral/Psych: Denies anxiety, mood instability  Physical Exam: Vitals:   06/11/20 1432  BP: 114/76  Pulse: 75  Resp: 17  Temp: (!) 97.4 F (36.3 C)  SpO2: 100%   KPS: 80. General: Alert, cooperative, pleasant, in no acute distress Head: Craniotomy scar noted, dry and intact. EENT: No conjunctival injection or scleral icterus. Oral mucosa moist Lungs: Resp effort normal Cardiac: Regular rate and rhythm Abdomen: Soft, non-distended abdomen Skin: No rashes cyanosis or petechiae. Extremities: No clubbing or edema  Neurologic Exam: Mental Status: Awake, alert, attentive to examiner. Oriented to self and environment. Language is notable for mild transcortical expressive dyshpasia. Cranial Nerves: Visual acuity is grossly normal. Right homonymous hemianopia. Extra-ocular movements intact. No ptosis. Face is symmetric, tongue midline. Motor: Tone and bulk are normal. Power is full in both arms and legs. Reflexes are  symmetric, no pathologic reflexes present. Intact finger to nose bilaterally Sensory: Intact to light touch and temperature Gait: Normal and tandem gait is deferred.   Labs: I have reviewed the data as listed    Component Value Date/Time   NA 141 06/11/2020 1405   K 4.1 06/11/2020 1405   CL 107 06/11/2020 1405   CO2 23 06/11/2020 1405   GLUCOSE 86 06/11/2020 1405   BUN 19 06/11/2020 1405   CREATININE 0.93 06/11/2020 1405   CALCIUM 9.4 06/11/2020 1405   PROT 6.7 06/11/2020 1405   ALBUMIN 3.5 06/11/2020 1405   AST 46 (H) 06/11/2020 1405   ALT 36 06/11/2020 1405   ALKPHOS 209 (H) 06/11/2020 1405   BILITOT 0.4 06/11/2020 1405   GFRNONAA >60 06/11/2020 1405   GFRAA >60 10/03/2019 1133   Lab Results  Component Value Date   WBC 5.3 06/11/2020   NEUTROABS 3.1 06/11/2020   HGB 12.6 06/11/2020   HCT 38.1 06/11/2020   MCV 100.8 (H) 06/11/2020   PLT 116 (L) 06/11/2020   Assessment/Plan Glioblastoma, IDH-1 WT   Jillian Gomez is  clinically stable today.  Labs are within normal limits today, with liver enzymes downtrending.  She is cleared to dose avastin today. We will continue to hold off on further CCNU/chemo given progressive thromobcytopenia.    Avastin should be held for the following:  ANC less than 500  Platelets less than 50,000  LFT or creatinine greater than 2x ULN  If clinical concerns/contraindications develop  We ask that Jillian Gomez return to clinic in 2 weeks with brain imaging for review.  MRI scheduled for end of next week.  All questions were answered. The patient knows to call the clinic with any problems, questions or concerns. No barriers to learning were detected.  I have spent a total of 30 minutes of face-to-face and non-face-to-face time, excluding clinical staff time, preparing to see patient, ordering tests and/or medications, counseling the patient, and independently interpreting results and communicating results to the  patient/family/caregiver    Ventura Sellers, MD Medical Director of Neuro-Oncology [facility] 06/11/20 2:26 PM

## 2020-06-11 NOTE — Patient Instructions (Signed)
[facility]  Discharge Instructions: Thank you for choosing [facility] to provide your oncology and hematology care.   If you have a lab appointment with the [facility], please go directly to the [facility] and check in at the registration area.   Wear comfortable clothing and clothing appropriate for easy access to any Portacath or PICC line.   We strive to give you quality time with your provider. You may need to reschedule your appointment if you arrive late (15 or more minutes).  Arriving late affects you and other patients whose appointments are after yours.  Also, if you miss three or more appointments without notifying the office, you may be dismissed from the clinic at the provider's discretion.      For prescription refill requests, have your pharmacy contact our office and allow 72 hours for refills to be completed.    Today you received the following chemotherapy and/or immunotherapy agent: Bevacizumab (Avastin)      To help prevent nausea and vomiting after your treatment, we encourage you to take your nausea medication as directed.  BELOW ARE SYMPTOMS THAT SHOULD BE REPORTED IMMEDIATELY: . *FEVER GREATER THAN 100.4 F (38 C) OR HIGHER . *CHILLS OR SWEATING . *NAUSEA AND VOMITING THAT IS NOT CONTROLLED WITH YOUR NAUSEA MEDICATION . *UNUSUAL SHORTNESS OF BREATH . *UNUSUAL BRUISING OR BLEEDING . *URINARY PROBLEMS (pain or burning when urinating, or frequent urination) . *BOWEL PROBLEMS (unusual diarrhea, constipation, pain near the anus) . TENDERNESS IN MOUTH AND THROAT WITH OR WITHOUT PRESENCE OF ULCERS (sore throat, sores in mouth, or a toothache) . UNUSUAL RASH, SWELLING OR PAIN  . UNUSUAL VAGINAL DISCHARGE OR ITCHING   Items with * indicate a potential emergency and should be followed up as soon as possible or go to the Emergency Department if any problems should occur.  Please show the CHEMOTHERAPY ALERT CARD or  IMMUNOTHERAPY ALERT CARD at check-in to the Emergency Department and triage nurse.  Should you have questions after your visit or need to cancel or reschedule your appointment, please contact [facility]  Dept: [phone]  and follow the prompts.  Office hours are 8:00 a.m. to 4:30 p.m. Monday - Friday. Please note that voicemails left after 4:00 p.m. may not be returned until the following business day.  We are closed weekends and major holidays. You have access to a nurse at all times for urgent questions. Please call the main number to the clinic Dept: [phone] and follow the prompts.   For any non-urgent questions, you may also contact your provider using MyChart. We now offer e-Visits for anyone 74 and older to request care online for non-urgent symptoms. For details visit [email or web address].   Also download the MyChart app! Go to the app store, search "MyChart", open the app, select [facility], and log in with your MyChart username and password.  Due to Covid, a mask is required upon entering the hospital/clinic. If you do not have a mask, one will be given to you upon arrival. For doctor visits, patients may have 1 support person aged 36 or older with them. For treatment visits, patients cannot have anyone with them due to current Covid guidelines and our immunocompromised population.

## 2020-06-13 ENCOUNTER — Ambulatory Visit: Payer: BC Managed Care – PPO | Admitting: Internal Medicine

## 2020-06-13 ENCOUNTER — Ambulatory Visit: Payer: BC Managed Care – PPO

## 2020-06-13 ENCOUNTER — Other Ambulatory Visit: Payer: BC Managed Care – PPO

## 2020-06-21 ENCOUNTER — Other Ambulatory Visit: Payer: Self-pay

## 2020-06-21 ENCOUNTER — Ambulatory Visit (HOSPITAL_COMMUNITY)
Admission: RE | Admit: 2020-06-21 | Discharge: 2020-06-21 | Disposition: A | Discharge status: Home / Self Care | Payer: BC Managed Care – PPO | Source: Ambulatory Visit | Attending: Internal Medicine | Admitting: Internal Medicine

## 2020-06-21 DIAGNOSIS — C719 Malignant neoplasm of brain, unspecified: Secondary | ICD-10-CM | POA: Insufficient documentation

## 2020-06-21 DIAGNOSIS — G319 Degenerative disease of nervous system, unspecified: Secondary | ICD-10-CM | POA: Diagnosis not present

## 2020-06-21 IMAGING — MR MR HEAD WO/W CM
13 series · 48 of 48 positions shown · IV contrast (gadavist)
Comparison: Multiple prior MRI scans, most recently [DATE] and
[DATE].

CLINICAL DATA: Post CNS neoplasm. Assess response. GBM.
Chemotherapy and Avastin.

EXAM:
MRI HEAD WITHOUT AND WITH CONTRAST
TECHNIQUE: Multiplanar, multiecho pulse sequences of the brain and surrounding
structures were obtained without and with intravenous contrast.
CONTRAST:  4mL GADAVIST GADOBUTROL 1 MMOL/ML IV SOLN

[Series 5: DWI · axial · 3.0mm · 1.36mm/px · z∈[-88,+71]mm · 5 of 108 slices shown (1 of 2)]
[im 1/108]
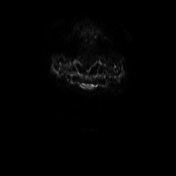
[im 27/108]
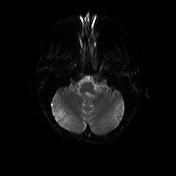
[im 54/108]
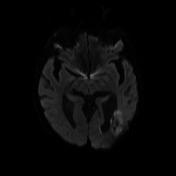
[im 81/108]
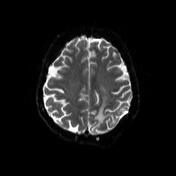
[im 108/108]
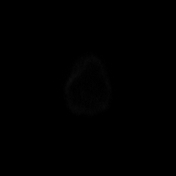

[Series 6: DWI · axial · 3.0mm · 1.36mm/px · z∈[-88,+71]mm · 2 of 53 slices shown (2 of 2)]
[im 1/53]
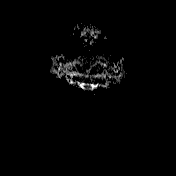
[im 53/53]
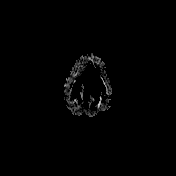

[Series 7: T1 · sagittal · 5.0mm · 0.75mm/px · 2 of 26 slices shown (1 of 2)]
[im 1/26]
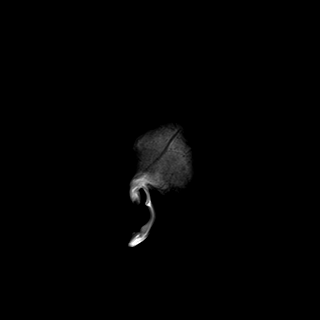
[im 26/26]
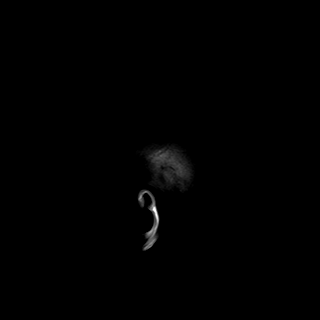

[Series 8: T2 · axial · 5.0mm · 0.62mm/px · z∈[-91,+71]mm · 2 of 26 slices shown]
[im 1/26]
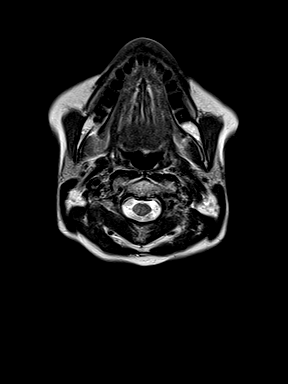
[im 26/26]
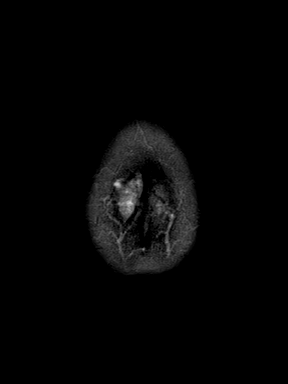

[Series 9: swi_images · axial · 3.0mm · 0.75mm/px · z∈[-93,+72]mm · 3 of 56 slices shown]
[im 1/56]
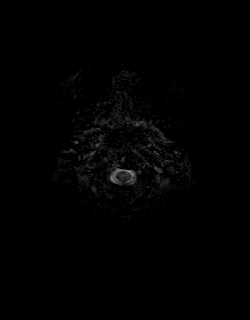
[im 28/56]
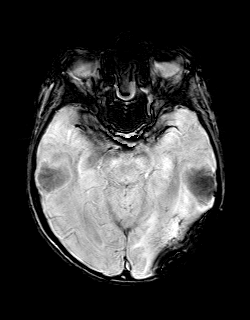
[im 56/56]
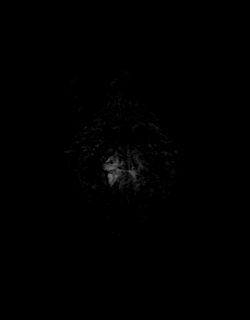

[Series 11: FLAIR · axial · 3.0mm · 0.75mm/px · z∈[-90,+69]mm · 3 of 54 slices shown]
[im 1/54]
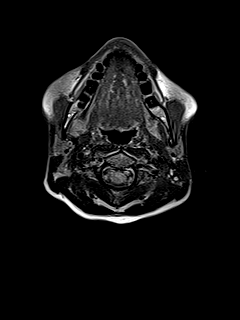
[im 27/54]
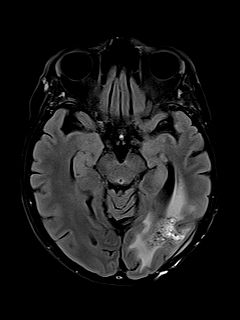
[im 54/54]
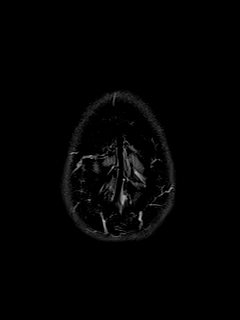

[Series 12: T1 · axial · 1.0mm · 0.94mm/px · z∈[-90,+69]mm · 10 of 160 slices shown (2 of 2)]
[im 1/160]
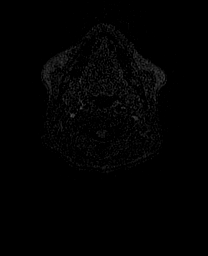
[im 18/160]
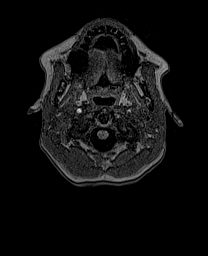
[im 36/160]
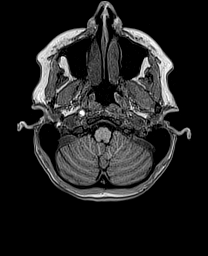
[im 54/160]
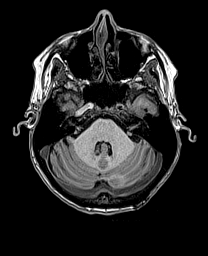
[im 71/160]
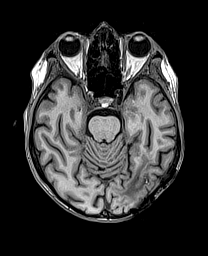
[im 89/160]
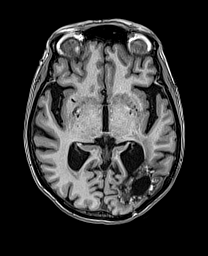
[im 107/160]
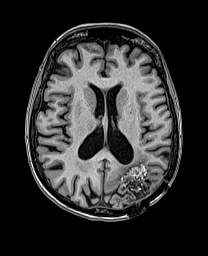
[im 124/160]
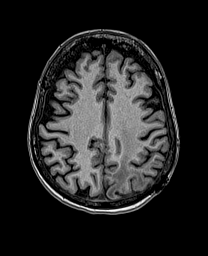
[im 142/160]
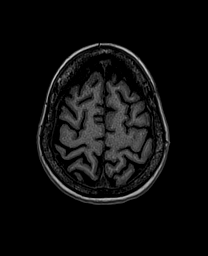
[im 160/160]
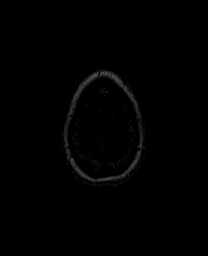

[Series 13: cor dwi_tracew · coronal · 5.0mm · 1.53mm/px · 3 of 52 slices shown]
[im 1/52]
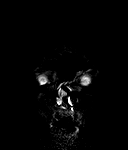
[im 26/52]
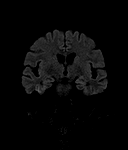
[im 52/52]
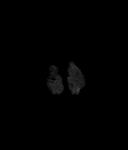

[Series 14: cor dwi_adc · coronal · 5.0mm · 1.53mm/px · 2 of 26 slices shown]
[im 1/26]
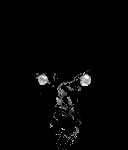
[im 26/26]
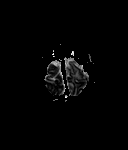

[Series 15: T2 post-contrast · coronal · 5.0mm · 0.57mm/px · 2 of 27 slices shown]
[im 1/27]
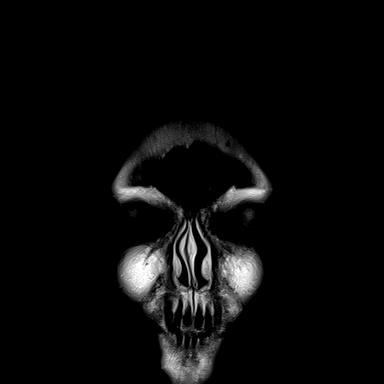
[im 27/27]
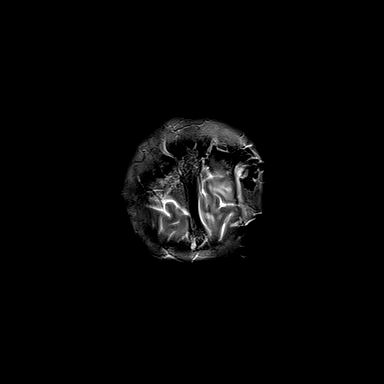

[Series 16: T1 post-contrast · axial · 1.0mm · 0.94mm/px · z∈[-90,+69]mm · 10 of 160 slices shown (1 of 3)]
[im 1/160]
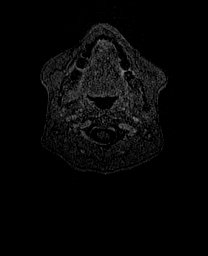
[im 18/160]
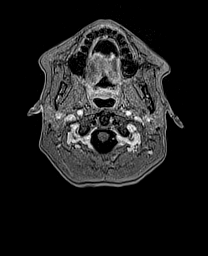
[im 36/160]
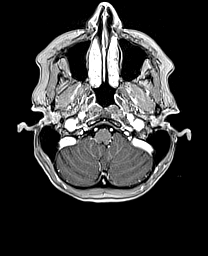
[im 54/160]
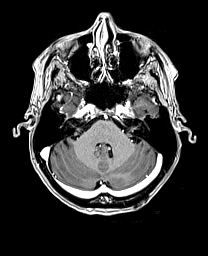
[im 71/160]
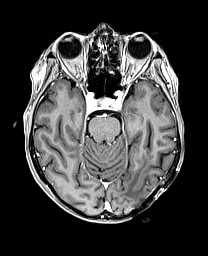
[im 89/160]
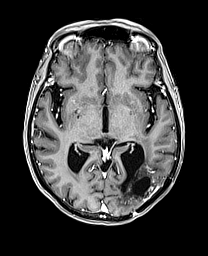
[im 107/160]
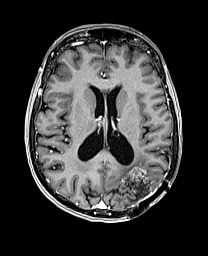
[im 124/160]
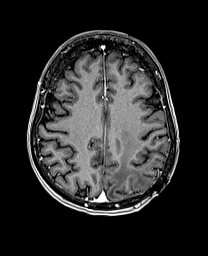
[im 142/160]
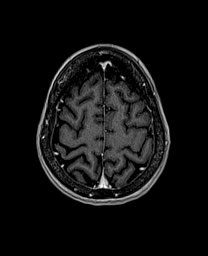
[im 160/160]
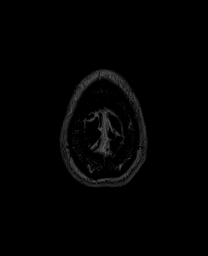

[Series 17: T1 post-contrast · coronal · 5.0mm · 0.43mm/px · 2 of 27 slices shown (2 of 3)]
[im 1/27]
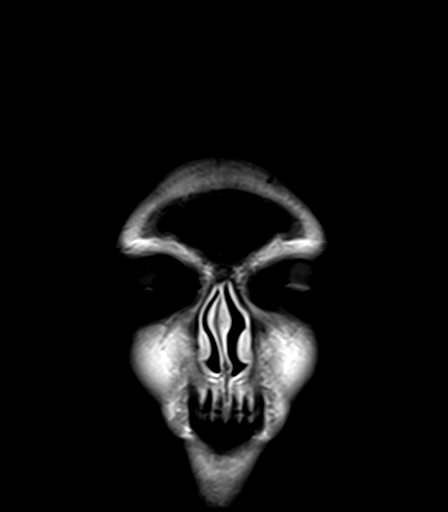
[im 27/27]
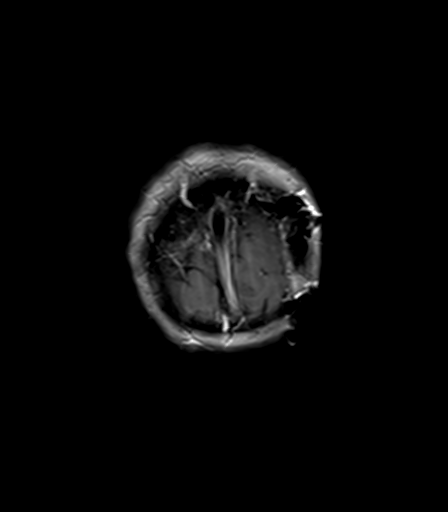

[Series 18: T1 post-contrast · sagittal · 5.0mm · 0.75mm/px · 2 of 26 slices shown (3 of 3)]
[im 1/26]
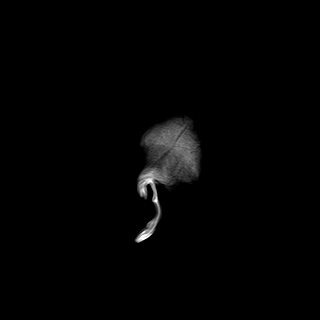
[im 26/26]
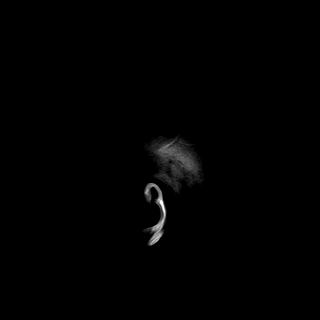

[48 of 48 positions shown; findings below may reference images not displayed]

FINDINGS: Brain: Postoperative changes again noted in the left parietal lobe.
Areas of T1 shortening around the operative bed are stable. Minimal
if any enhancement is present, stable from prior exams.

No new T1 signal abnormality or enhancement is present.

FLAIR and T2 signal changes extend more superiorly within the left
parietal lobe than on previous exams. Signal changes on image 45 and
superiorly of series 11, the FLAIR sequence are new from the prior
exams. No associated enhancement is present.

T2 signal changes surround the posterolateral ventricles
bilaterally, compatible with prior radiation change.

Ventricles are proportionate to the degree of atrophy.

The internal auditory canals are within normal limits. Insert normal
brainstem

Vascular: Flow is present in the major intracranial arteries.

Skull and upper cervical spine: The craniocervical junction is
normal. Upper cervical spine is within normal limits. Marrow signal
is unremarkable.

Sinuses/Orbits: The paranasal sinuses and mastoid air cells are
clear. The globes and orbits are within normal limits.
IMPRESSION: 1. Stable post treatment changes in the left parietal lobe without
evidence for residual or recurrent tumor.
2. Increased FLAIR and T2 signal changes in the left parietal lobe
superior to the operative bed, concerning for tumor progression.
3. No other acute intracranial abnormality.

## 2020-06-21 MED ORDER — GADOBUTROL 1 MMOL/ML IV SOLN
4.0000 mL | Freq: Once | INTRAVENOUS | Status: AC | PRN
  Administered 2020-06-21: 4 mL via INTRAVENOUS

## 2020-06-25 ENCOUNTER — Other Ambulatory Visit: Payer: Self-pay

## 2020-06-25 ENCOUNTER — Inpatient Hospital Stay: Payer: BC Managed Care – PPO

## 2020-06-25 ENCOUNTER — Inpatient Hospital Stay: Payer: BC Managed Care – PPO | Admitting: Internal Medicine

## 2020-06-25 VITALS — BP 122/62 | HR 64

## 2020-06-25 VITALS — BP 132/64 | HR 75 | Temp 97.7°F | Resp 17 | Ht 62.0 in | Wt 98.5 lb

## 2020-06-25 DIAGNOSIS — Z5112 Encounter for antineoplastic immunotherapy: Secondary | ICD-10-CM | POA: Diagnosis not present

## 2020-06-25 DIAGNOSIS — C719 Malignant neoplasm of brain, unspecified: Secondary | ICD-10-CM

## 2020-06-25 DIAGNOSIS — Z79899 Other long term (current) drug therapy: Secondary | ICD-10-CM | POA: Diagnosis not present

## 2020-06-25 DIAGNOSIS — E039 Hypothyroidism, unspecified: Secondary | ICD-10-CM | POA: Diagnosis not present

## 2020-06-25 DIAGNOSIS — E7800 Pure hypercholesterolemia, unspecified: Secondary | ICD-10-CM | POA: Diagnosis not present

## 2020-06-25 DIAGNOSIS — Z923 Personal history of irradiation: Secondary | ICD-10-CM | POA: Diagnosis not present

## 2020-06-25 DIAGNOSIS — C714 Malignant neoplasm of occipital lobe: Secondary | ICD-10-CM | POA: Diagnosis not present

## 2020-06-25 LAB — CBC WITH DIFFERENTIAL (CANCER CENTER ONLY)
Abs Immature Granulocytes: 0.01 10*3/uL (ref 0.00–0.07)
Basophils Absolute: 0.1 10*3/uL (ref 0.0–0.1)
Basophils Relative: 1 %
Eosinophils Absolute: 0.3 10*3/uL (ref 0.0–0.5)
Eosinophils Relative: 6 %
HCT: 39.9 % (ref 36.0–46.0)
Hemoglobin: 13.2 g/dL (ref 12.0–15.0)
Immature Granulocytes: 0 %
Lymphocytes Relative: 19 %
Lymphs Abs: 1 10*3/uL (ref 0.7–4.0)
MCH: 32.4 pg (ref 26.0–34.0)
MCHC: 33.1 g/dL (ref 30.0–36.0)
MCV: 98 fL (ref 80.0–100.0)
Monocytes Absolute: 0.6 10*3/uL (ref 0.1–1.0)
Monocytes Relative: 11 %
Neutro Abs: 3.3 10*3/uL (ref 1.7–7.7)
Neutrophils Relative %: 63 %
Platelet Count: 117 10*3/uL — ABNORMAL LOW (ref 150–400)
RBC: 4.07 MIL/uL (ref 3.87–5.11)
RDW: 12.2 % (ref 11.5–15.5)
WBC Count: 5.2 10*3/uL (ref 4.0–10.5)
nRBC: 0 % (ref 0.0–0.2)

## 2020-06-25 LAB — CMP (CANCER CENTER ONLY)
ALT: 38 U/L (ref 0–44)
AST: 46 U/L — ABNORMAL HIGH (ref 15–41)
Albumin: 3.8 g/dL (ref 3.5–5.0)
Alkaline Phosphatase: 177 U/L — ABNORMAL HIGH (ref 38–126)
Anion gap: 12 (ref 5–15)
BUN: 28 mg/dL — ABNORMAL HIGH (ref 8–23)
CO2: 21 mmol/L — ABNORMAL LOW (ref 22–32)
Calcium: 9.6 mg/dL (ref 8.9–10.3)
Chloride: 107 mmol/L (ref 98–111)
Creatinine: 1.06 mg/dL — ABNORMAL HIGH (ref 0.44–1.00)
GFR, Estimated: 59 mL/min — ABNORMAL LOW (ref >60)
Glucose, Bld: 73 mg/dL (ref 70–99)
Potassium: 4.2 mmol/L (ref 3.5–5.1)
Sodium: 140 mmol/L (ref 135–145)
Total Bilirubin: 0.5 mg/dL (ref 0.3–1.2)
Total Protein: 6.9 g/dL (ref 6.5–8.1)

## 2020-06-25 LAB — TOTAL PROTEIN, URINE DIPSTICK: Protein, ur: 300 mg/dL — AB

## 2020-06-25 MED ORDER — SODIUM CHLORIDE 0.9 % IV SOLN
Freq: Once | INTRAVENOUS | Status: AC
  Filled 2020-06-25: qty 250

## 2020-06-25 MED ORDER — SODIUM CHLORIDE 0.9 % IV SOLN: 10.0000 mg/kg | Freq: Once | INTRAVENOUS | Status: DC

## 2020-06-25 MED ORDER — SODIUM CHLORIDE 0.9 % IV SOLN
10.0000 mg/kg | Freq: Once | INTRAVENOUS | Status: AC
  Administered 2020-06-25: 450 mg via INTRAVENOUS
  Filled 2020-06-25: qty 16

## 2020-06-25 NOTE — Progress Notes (Signed)
Per Dr Mickeal Skinner 06/25/2020 ok to proceed with Avastin treatment with urine protein of 300.

## 2020-06-25 NOTE — Progress Notes (Signed)
Ok to use today's weight per Dr. Mickeal Skinner.  Bevacizumab dose adjusted accordingly.  Kennith Center, Pharm.D., CPP 06/25/2020@10 :40 AM

## 2020-06-25 NOTE — Progress Notes (Signed)
[facility] at [place], [place] [address] [phone]   Interval Evaluation  Date of Service: 06/25/20 Patient Name: Jillian Gomez Patient MRN: [id number] Patient DOB: 02-26-1955 Provider: Ventura Sellers, MD  Identifying Statement:  [name] is a 65 y.o. female with left occipital glioblastoma   Oncologic History: Oncology History  Glioblastoma with isocitrate dehydrogenase gene wildtype ([place])  12/23/2018 Surgery   Craniotomy, left occipital resection by Dr. Kathyrn Sheriff.  Path is GBM IDH-wt   01/23/2019 - 03/03/2019 Radiation Therapy   IMRT with concurrent Temozolomide 66m/m2   03/28/2019 - 09/04/2019 Chemotherapy   5 cycles of adjuvant 5-day Temozolomide 2066mm2    09/01/2019 Progression   Progression of disease #1   09/19/2019 -  Chemotherapy   Begins second line therapy with oral CCNU 9039m2 q6 weeks, concurrent Avastin 91m34m IV q2 weeks     Biomarkers:  MGMT Unknown.  IDH 1/2 Wild type.  EGFR Unknown  TERT Unknown   Interval History:  RondOSMARA DRUMMONDSsents today for evaluation following recent MRI brain. She denies new or progressive deficits today.  Stable right sided vision, no clear change from prior.  No right sided weakness. No seizures or headaches.  H+P (01/09/19) Patient presented to medical attention in early December with ~2 months history of right sided visual impairment.  She describes fuzzy or blurry vision on that side which was progressive over time, leading to at least one fall.  MRI brain demonstrated enhancing left occipital mass; this was subsequently resected by Dr. NundKathyrn Sheriff12/18/20.  She had no issues with surgery and has completed her steroid taper.  Continues on keppra daily but no history of any seizure. She has returned to work with only modest difficutly.  No issues walking or performing ADLs; lives alone but her sister and other family members are nearby.  Medications: Current  Outpatient Medications on File Prior to Visit  Medication Sig Dispense Refill   Biotin 1 MG CAPS Take 1 mg by mouth daily.     CALCIUM PO Take 1 tablet by mouth daily.     EUTHYROX 50 MCG tablet Take 50 mcg by mouth daily.     naproxen sodium (ALEVE) 220 MG tablet Take 1 tablet (220 mg total) by mouth daily as needed (pain).     omeprazole (PRILOSEC) 20 MG capsule Take 1 capsule (20 mg total) by mouth daily. 30 capsule 3   ondansetron (ZOFRAN) 8 MG tablet Take 1 tablet (8 mg total) by mouth 2 (two) times daily as needed. Start on the third day after chemotherapy. 30 tablet 1   doxycycline (VIBRA-TABS) 100 MG tablet Take 1 tablet by mouth daily. (Patient not taking: No sig reported)     No current facility-administered medications on file prior to visit.    Allergies:  Allergies  Allergen Reactions   Etodolac Other (See Comments)    GI upset   Macrobid [Nitrofurantoin] Rash   Past Medical History:  Past Medical History:  Diagnosis Date   High cholesterol    Hypothyroidism    Joint pain    Thrombocytopenia (HCC)[place]21/2022   Past Surgical History:  Past Surgical History:  Procedure Laterality Date   APPLICATION OF CRANIAL NAVIGATION Left 12/23/2018   Procedure: APPLICATION OF CRANIAL NAVIGATION;  Surgeon: NundConsuella Lose;  [facility];  Laterality: Left;  APPLICATION OF CRANIAL NAVIGATION   BREAST BIOPSY Left    COLONOSCOPY  CRANIOTOMY Left 12/23/2018   Procedure: STEREOTACTIC LEFT OCCIPITAL CRANIOTOMY FOR RESECTION OF TUMOR;  Surgeon: Consuella Lose, MD;  Location: [facility];  Service: Neurosurgery;  Laterality: Left;  STEREOTACTIC LEFT OCCIPITAL CRANIOTOMY FOR RESECTION OF TUMOR   LASIK     Social History:  Social History   Socioeconomic History   Marital status: Single    Spouse name: Not on file   Number of children: Not on file   Years of education: Not on file   Highest education level: Not on file  Occupational History   Not  on file  Tobacco Use   Smoking status: Never   Smokeless tobacco: Never  Vaping Use   Vaping Use: Never used  Substance and Sexual Activity   Alcohol use: Not Currently   Drug use: Never   Sexual activity: Not on file  Other Topics Concern   Not on file  Social History Narrative   Not on file   Social Determinants of Health   Financial Resource Strain: Not on file  Food Insecurity: Not on file  Transportation Needs: Not on file  Physical Activity: Not on file  Stress: Not on file  Social Connections: Not on file  Intimate Partner Violence: Not on file   Family History: No family history on file.  Review of Systems: Constitutional: Denies fevers, chills or abnormal weight loss Eyes: Denies blurriness of vision Gastrointestinal:  Denies nausea, constipation, diarrhea GU: Denies dysuria or incontinence Skin: Denies abnormal skin rashes Musculoskeletal: Denies joint pain, back or neck discomfort.  Behavioral/Psych: Denies anxiety, mood instability  Physical Exam: Vitals:   06/25/20 0930  BP: 132/64  Pulse: 75  Resp: 17  Temp: 97.7 F (36.5 C)  SpO2: 100%   KPS: 80. General: Alert, cooperative, pleasant, in no acute distress Head: Craniotomy scar noted, dry and intact. EENT: No conjunctival injection or scleral icterus. Oral mucosa moist Lungs: Resp effort normal Cardiac: Regular rate and rhythm Abdomen: Soft, non-distended abdomen Skin: No rashes cyanosis or petechiae. Extremities: No clubbing or edema  Neurologic Exam: Mental Status: Awake, alert, attentive to examiner. Oriented to self and environment. Language is notable for mild transcortical expressive dyshpasia. Cranial Nerves: Visual acuity is grossly normal. Right homonymous hemianopia. Extra-ocular movements intact. No ptosis. Face is symmetric, tongue midline. Motor: Tone and bulk are normal. Power is full in both arms and legs. Reflexes are symmetric, no pathologic reflexes present. Intact finger to  nose bilaterally Sensory: Intact to light touch and temperature Gait: Normal and tandem gait is deferred.   Labs: I have reviewed the data as listed    Component Value Date/Time   NA 141 06/11/2020 1405   K 4.1 06/11/2020 1405   CL 107 06/11/2020 1405   CO2 23 06/11/2020 1405   GLUCOSE 86 06/11/2020 1405   BUN 19 06/11/2020 1405   CREATININE 0.93 06/11/2020 1405   CALCIUM 9.4 06/11/2020 1405   PROT 6.7 06/11/2020 1405   ALBUMIN 3.5 06/11/2020 1405   AST 46 (H) 06/11/2020 1405   ALT 36 06/11/2020 1405   ALKPHOS 209 (H) 06/11/2020 1405   BILITOT 0.4 06/11/2020 1405   GFRNONAA >60 06/11/2020 1405   GFRAA >60 10/03/2019 1133   Lab Results  Component Value Date   WBC 5.2 06/25/2020   NEUTROABS 3.3 06/25/2020   HGB 13.2 06/25/2020   HCT 39.9 06/25/2020   MCV 98.0 06/25/2020   PLT 117 (L) 06/25/2020   Imaging:  [facility] Clinician Interpretation: I have personally reviewed the CNS images as  listed.  My interpretation, in the context of the patient's clinical presentation, is stable disease  MR BRAIN W WO CONTRAST  Result Date: 06/22/2020 CLINICAL DATA:  Post CNS neoplasm. Assess response. GBM. Chemotherapy and Avastin. EXAM: MRI HEAD WITHOUT AND WITH CONTRAST TECHNIQUE: Multiplanar, multiecho pulse sequences of the brain and surrounding structures were obtained without and with intravenous contrast. CONTRAST:  97m GADAVIST GADOBUTROL 1 MMOL/ML IV SOLN COMPARISON:  Multiple prior MRI scans, most recently 04/26/2020 and 02/24/2020. FINDINGS: Brain: Postoperative changes again noted in the left parietal lobe. Areas of T1 shortening around the operative bed are stable. Minimal if any enhancement is present, stable from prior exams. No new T1 signal abnormality or enhancement is present. FLAIR and T2 signal changes extend more superiorly within the left parietal lobe than on previous exams. Signal changes on image 45 and superiorly of series 11, the FLAIR sequence are new from the prior  exams. No associated enhancement is present. T2 signal changes surround the posterolateral ventricles bilaterally, compatible with prior radiation change. Ventricles are proportionate to the degree of atrophy. The internal auditory canals are within normal limits. Insert normal brainstem Vascular: Flow is present in the major intracranial arteries. Skull and upper cervical spine: The craniocervical junction is normal. Upper cervical spine is within normal limits. Marrow signal is unremarkable. Sinuses/Orbits: The paranasal sinuses and mastoid air cells are clear. The globes and orbits are within normal limits. IMPRESSION: 1. Stable post treatment changes in the left parietal lobe without evidence for residual or recurrent tumor. 2. Increased FLAIR and T2 signal changes in the left parietal lobe superior to the operative bed, concerning for tumor progression. 3. No other acute intracranial abnormality. Electronically Signed   By: CSan MorelleM.D.   On: 06/22/2020 13:37     Assessment/Plan Glioblastoma, IDH-1 WT   RWILLEEN NOVAKis clinically and radiographically stable today.  No new or progressive deficits.  We will continue with single agent Avastin, given good response and cytopenias with cytotoxic chemotherapy.   She is cleared to dose avastin today. Avastin should be held for the following:   ANC less than 500  Platelets less than 50,000  LFT or creatinine greater than 2x ULN  If clinical concerns/contraindications develop  We ask that RALEJA YEARWOODreturn to clinic in 2 weeks with labs for review prior to next infusion.  All questions were answered. The patient knows to call the clinic with any problems, questions or concerns. No barriers to learning were detected.  I have spent a total of 30 minutes of face-to-face and non-face-to-face time, excluding clinical staff time, preparing to see patient, ordering tests and/or medications, counseling the patient, and independently  interpreting results and communicating results to the patient/family/caregiver    ZVentura Sellers MD Medical Director of Neuro-Oncology [facility]06/21/22 9:42 AM

## 2020-06-25 NOTE — Patient Instructions (Signed)
[facility]ICAL [facility]COLOG[facility]  Discharge Instructions: °Thank you for choosing [facility] to provide your oncology and hematology care.  ° °If you have a lab appointment with the Cancer Center, please go directly to the Cancer Center and check in at the registration area. °  °Wear comfortable clothing and clothing appropriate for easy access to any Portacath or PICC line.  ° °We strive to give you quality time with your provider. You may need to reschedule your appointment if you arrive late (15 or more minutes).  Arriving late affects you and other patients whose appointments are after yours.  Also, if you miss three or more appointments without notifying the office, you may be dismissed from the clinic at the provider’s discretion.    °  °For prescription refill requests, have your pharmacy contact our office and allow 72 hours for refills to be completed.   ° °Today you received the following chemotherapy and/or immunotherapy agents: bevacizumab    °  °To help prevent nausea and vomiting after your treatment, we encourage you to take your nausea medication as directed. ° °BELOW ARE SYMPTOMS THAT SHOULD BE REPORTED IMMEDIATELY: °*FEVER GREATER THAN 100.4 F (38 °C) OR HIGHER °*CHILLS OR SWEATING °*NAUSEA AND VOMITING THAT IS NOT CONTROLLED WITH YOUR NAUSEA MEDICATION °*UNUSUAL SHORTNESS OF BREATH °*UNUSUAL BRUISING OR BLEEDING °*URINARY PROBLEMS (pain or burning when urinating, or frequent urination) °*BOWEL PROBLEMS (unusual diarrhea, constipation, pain near the anus) °TENDERNESS IN MOUTH AND THROAT WITH OR WITHOUT PRESENCE OF ULCERS (sore throat, sores in mouth, or a toothache) °UNUSUAL RASH, SWELLING OR PAIN  °UNUSUAL VAGINAL DISCHARGE OR ITCHING  ° °Items with * indicate a potential emergency and should be followed up as soon as possible or go to the Emergency Department if any problems should occur. ° °Please show the CHEMOTHERAPY ALERT CARD or IMMUNOTHERAPY ALERT CARD at check-in  to the Emergency Department and triage nurse. ° °Should you have questions after your visit or need to cancel or reschedule your appointment, please contact [facility]  Dept: [phone]  and follow the prompts.  Office hours are 8:00 a.m. to 4:30 p.m. Monday - Friday. Please note that voicemails left after 4:00 p.m. may not be returned until the following business day.  We are closed weekends and major holidays. You have access to a nurse at all times for urgent questions. Please call the main number to the clinic Dept: [phone] and follow the prompts. ° ° °For any non-urgent questions, you may also contact your provider using MyChart. We now offer e-Visits for anyone 18 and older to request care online for non-urgent symptoms. For details visit [email or web address]. °  °Also download the MyChart app! Go to the app store, search "MyChart", open the app, select [facility], and log in with your MyChart username and password. ° °Due to Covid, a mask is required upon entering the hospital/clinic. If you do not have a mask, one will be given to you upon arrival. For doctor visits, patients may have 1 support person aged 18 or older with them. For treatment visits, patients cannot have anyone with them due to current Covid guidelines and our immunocompromised population.  ° °

## 2020-07-04 ENCOUNTER — Other Ambulatory Visit: Payer: Self-pay | Admitting: Internal Medicine

## 2020-07-09 ENCOUNTER — Inpatient Hospital Stay: Payer: BC Managed Care – PPO | Attending: Internal Medicine

## 2020-07-09 ENCOUNTER — Inpatient Hospital Stay: Payer: BC Managed Care – PPO

## 2020-07-09 ENCOUNTER — Inpatient Hospital Stay (HOSPITAL_BASED_OUTPATIENT_CLINIC_OR_DEPARTMENT_OTHER): Payer: BC Managed Care – PPO | Admitting: Internal Medicine

## 2020-07-09 ENCOUNTER — Other Ambulatory Visit: Payer: Self-pay

## 2020-07-09 VITALS — BP 141/82 | HR 83 | Temp 97.7°F | Resp 17 | Ht 62.0 in | Wt 99.5 lb

## 2020-07-09 DIAGNOSIS — E039 Hypothyroidism, unspecified: Secondary | ICD-10-CM | POA: Diagnosis not present

## 2020-07-09 DIAGNOSIS — Z79899 Other long term (current) drug therapy: Secondary | ICD-10-CM | POA: Diagnosis not present

## 2020-07-09 DIAGNOSIS — C713 Malignant neoplasm of parietal lobe: Secondary | ICD-10-CM | POA: Diagnosis not present

## 2020-07-09 DIAGNOSIS — E7800 Pure hypercholesterolemia, unspecified: Secondary | ICD-10-CM | POA: Insufficient documentation

## 2020-07-09 DIAGNOSIS — C714 Malignant neoplasm of occipital lobe: Secondary | ICD-10-CM | POA: Diagnosis not present

## 2020-07-09 DIAGNOSIS — C719 Malignant neoplasm of brain, unspecified: Secondary | ICD-10-CM

## 2020-07-09 DIAGNOSIS — Z5112 Encounter for antineoplastic immunotherapy: Secondary | ICD-10-CM | POA: Diagnosis not present

## 2020-07-09 LAB — CBC WITH DIFFERENTIAL (CANCER CENTER ONLY)
Abs Immature Granulocytes: 0.01 10*3/uL (ref 0.00–0.07)
Basophils Absolute: 0.1 10*3/uL (ref 0.0–0.1)
Basophils Relative: 1 %
Eosinophils Absolute: 0.2 10*3/uL (ref 0.0–0.5)
Eosinophils Relative: 3 %
HCT: 39.8 % (ref 36.0–46.0)
Hemoglobin: 13.5 g/dL (ref 12.0–15.0)
Immature Granulocytes: 0 %
Lymphocytes Relative: 17 %
Lymphs Abs: 1.3 10*3/uL (ref 0.7–4.0)
MCH: 32.5 pg (ref 26.0–34.0)
MCHC: 33.9 g/dL (ref 30.0–36.0)
MCV: 95.9 fL (ref 80.0–100.0)
Monocytes Absolute: 0.6 10*3/uL (ref 0.1–1.0)
Monocytes Relative: 8 %
Neutro Abs: 5.6 10*3/uL (ref 1.7–7.7)
Neutrophils Relative %: 71 %
Platelet Count: 123 10*3/uL — ABNORMAL LOW (ref 150–400)
RBC: 4.15 MIL/uL (ref 3.87–5.11)
RDW: 12.4 % (ref 11.5–15.5)
WBC Count: 7.7 10*3/uL (ref 4.0–10.5)
nRBC: 0 % (ref 0.0–0.2)

## 2020-07-09 LAB — CMP (CANCER CENTER ONLY)
ALT: 46 U/L — ABNORMAL HIGH (ref 0–44)
AST: 40 U/L (ref 15–41)
Albumin: 3.5 g/dL (ref 3.5–5.0)
Alkaline Phosphatase: 214 U/L — ABNORMAL HIGH (ref 38–126)
Anion gap: 10 (ref 5–15)
BUN: 35 mg/dL — ABNORMAL HIGH (ref 8–23)
CO2: 24 mmol/L (ref 22–32)
Calcium: 9.6 mg/dL (ref 8.9–10.3)
Chloride: 106 mmol/L (ref 98–111)
Creatinine: 1.13 mg/dL — ABNORMAL HIGH (ref 0.44–1.00)
GFR, Estimated: 54 mL/min — ABNORMAL LOW (ref >60)
Glucose, Bld: 78 mg/dL (ref 70–99)
Potassium: 4.3 mmol/L (ref 3.5–5.1)
Sodium: 140 mmol/L (ref 135–145)
Total Bilirubin: 0.5 mg/dL (ref 0.3–1.2)
Total Protein: 6.8 g/dL (ref 6.5–8.1)

## 2020-07-09 LAB — TOTAL PROTEIN, URINE DIPSTICK: Protein, ur: 300 mg/dL — AB

## 2020-07-09 MED ORDER — SODIUM CHLORIDE 0.9 % IV SOLN
Freq: Once | INTRAVENOUS | Status: AC
  Filled 2020-07-09: qty 250

## 2020-07-09 MED ORDER — SODIUM CHLORIDE 0.9 % IV SOLN
10.0000 mg/kg | Freq: Once | INTRAVENOUS | Status: AC
  Administered 2020-07-09: 450 mg via INTRAVENOUS
  Filled 2020-07-09: qty 16

## 2020-07-09 NOTE — Progress Notes (Signed)
[facility] at [place], [place] [address] [phone]   Interval Evaluation  Date of Service: 07/09/20 Patient Name: Jillian Gomez Patient MRN: [id number] Patient DOB: 04-02-1955 Provider: Ventura Sellers, MD  Identifying Statement:  FEVEN ALDERFER is a 65 y.o. female with left occipital glioblastoma   Oncologic History: Oncology History  Glioblastoma with isocitrate dehydrogenase gene wildtype ([place])  12/23/2018 Surgery   Craniotomy, left occipital resection by Dr. Kathyrn Sheriff.  Path is GBM IDH-wt   01/23/2019 - 03/03/2019 Radiation Therapy   IMRT with concurrent Temozolomide 89m/m2   03/28/2019 - 09/04/2019 Chemotherapy   5 cycles of adjuvant 5-day Temozolomide 2071mm2    09/01/2019 Progression   Progression of disease #1   09/19/2019 -  Chemotherapy   Begins second line therapy with oral CCNU 9080m2 q6 weeks, concurrent Avastin 77m33m IV q2 weeks     Biomarkers:  MGMT Unknown.  IDH 1/2 Wild type.  EGFR Unknown  TERT Unknown   Interval History:  Jillian HALBERTsents today for evaluation following recent MRI brain. No new or progressive deficits.  Stable right sided vision, no clear change from prior.  No right sided weakness. No seizures or headaches.  H+P (01/09/19) Patient presented to medical attention in early December with ~2 months history of right sided visual impairment.  She describes fuzzy or blurry vision on that side which was progressive over time, leading to at least one fall.  MRI brain demonstrated enhancing left occipital mass; this was subsequently resected by Dr. NundKathyrn Sheriff12/18/20.  She had no issues with surgery and has completed her steroid taper.  Continues on keppra daily but no history of any seizure. She has returned to work with only modest difficutly.  No issues walking or performing ADLs; lives alone but her sister and other family members are nearby.  Medications: Current Outpatient  Medications on File Prior to Visit  Medication Sig Dispense Refill   Biotin 1 MG CAPS Take 1 mg by mouth daily.     CALCIUM PO Take 1 tablet by mouth daily.     EUTHYROX 50 MCG tablet Take 50 mcg by mouth daily.     naproxen sodium (ALEVE) 220 MG tablet Take 1 tablet (220 mg total) by mouth daily as needed (pain).     omeprazole (PRILOSEC) 20 MG capsule Take 1 capsule (20 mg total) by mouth daily. 30 capsule 3   ondansetron (ZOFRAN) 8 MG tablet Take 1 tablet (8 mg total) by mouth 2 (two) times daily as needed. Start on the third day after chemotherapy. 30 tablet 1   doxycycline (VIBRA-TABS) 100 MG tablet Take 1 tablet by mouth daily. (Patient not taking: No sig reported)     No current facility-administered medications on file prior to visit.    Allergies:  Allergies  Allergen Reactions   Etodolac Other (See Comments)    GI upset   Macrobid [Nitrofurantoin] Rash   Past Medical History:  Past Medical History:  Diagnosis Date   High cholesterol    Hypothyroidism    Joint pain    Thrombocytopenia (HCC)[place]21/2022   Past Surgical History:  Past Surgical History:  Procedure Laterality Date   APPLICATION OF CRANIAL NAVIGATION Left 12/23/2018   Procedure: APPLICATION OF CRANIAL NAVIGATION;  Surgeon: NundConsuella Lose;  [facility];  Laterality: Left;  APPLICATION OF CRANIAL NAVIGATION   BREAST BIOPSY Left    COLONOSCOPY     CRANIOTOMY  Left 12/23/2018   Procedure: STEREOTACTIC LEFT OCCIPITAL CRANIOTOMY FOR RESECTION OF TUMOR;  Surgeon: Consuella Lose, MD;  Location: [facility];  Service: Neurosurgery;  Laterality: Left;  STEREOTACTIC LEFT OCCIPITAL CRANIOTOMY FOR RESECTION OF TUMOR   LASIK     Social History:  Social History   Socioeconomic History   Marital status: Single    Spouse name: Not on file   Number of children: Not on file   Years of education: Not on file   Highest education level: Not on file  Occupational History   Not on file   Tobacco Use   Smoking status: Never   Smokeless tobacco: Never  Vaping Use   Vaping Use: Never used  Substance and Sexual Activity   Alcohol use: Not Currently   Drug use: Never   Sexual activity: Not on file  Other Topics Concern   Not on file  Social History Narrative   Not on file   Social Determinants of Health   Financial Resource Strain: Not on file  Food Insecurity: Not on file  Transportation Needs: Not on file  Physical Activity: Not on file  Stress: Not on file  Social Connections: Not on file  Intimate Partner Violence: Not on file   Family History: No family history on file.  Review of Systems: Constitutional: Denies fevers, chills or abnormal weight loss Eyes: Denies blurriness of vision Gastrointestinal:  Denies nausea, constipation, diarrhea GU: Denies dysuria or incontinence Skin: Denies abnormal skin rashes Musculoskeletal: Denies joint pain, back or neck discomfort.  Behavioral/Psych: Denies anxiety, mood instability  Physical Exam: Vitals:   07/09/20 1232  BP: (!) 141/82  Pulse: 83  Resp: 17  Temp: 97.7 F (36.5 C)  SpO2: 100%   KPS: 80. General: Alert, cooperative, pleasant, in no acute distress Head: Craniotomy scar noted, dry and intact. EENT: No conjunctival injection or scleral icterus. Oral mucosa moist Lungs: Resp effort normal Cardiac: Regular rate and rhythm Abdomen: Soft, non-distended abdomen Skin: No rashes cyanosis or petechiae. Extremities: No clubbing or edema  Neurologic Exam: Mental Status: Awake, alert, attentive to examiner. Oriented to self and environment. Language is notable for mild transcortical expressive dyshpasia. Cranial Nerves: Visual acuity is grossly normal. Right homonymous hemianopia. Extra-ocular movements intact. No ptosis. Face is symmetric, tongue midline. Motor: Tone and bulk are normal. Power is full in both arms and legs. Reflexes are symmetric, no pathologic reflexes present. Intact finger to nose  bilaterally Sensory: Intact to light touch and temperature Gait: Normal and tandem gait is deferred.   Labs: I have reviewed the data as listed    Component Value Date/Time   NA 140 06/25/2020 0905   K 4.2 06/25/2020 0905   CL 107 06/25/2020 0905   CO2 21 (L) 06/25/2020 0905   GLUCOSE 73 06/25/2020 0905   BUN 28 (H) 06/25/2020 0905   CREATININE 1.06 (H) 06/25/2020 0905   CALCIUM 9.6 06/25/2020 0905   PROT 6.9 06/25/2020 0905   ALBUMIN 3.8 06/25/2020 0905   AST 46 (H) 06/25/2020 0905   ALT 38 06/25/2020 0905   ALKPHOS 177 (H) 06/25/2020 0905   BILITOT 0.5 06/25/2020 0905   GFRNONAA 59 (L) 06/25/2020 0905   GFRAA >60 10/03/2019 1133   Lab Results  Component Value Date   WBC 7.7 07/09/2020   NEUTROABS 5.6 07/09/2020   HGB 13.5 07/09/2020   HCT 39.8 07/09/2020   MCV 95.9 07/09/2020   PLT 123 (L) 07/09/2020    Assessment/Plan Glioblastoma, IDH-1 WT   Jillian American  Gomez is clinically stable today.  Labs notable for proteinuria, within safe range for avastin rx.  We will continue with single agent Avastin, given good response and cytopenias with cytotoxic chemotherapy.   Avastin should be held for the following:   ANC less than 500  Platelets less than 50,000  LFT or creatinine greater than 2x ULN  If clinical concerns/contraindications develop  We ask that Jillian Gomez return to clinic in 3 weeks with labs for review prior to next infusion.  Next MRI can be for 08/23/20.  All questions were answered. The patient knows to call the clinic with any problems, questions or concerns. No barriers to learning were detected.  I have spent a total of 30 minutes of face-to-face and non-face-to-face time, excluding clinical staff time, preparing to see patient, ordering tests and/or medications, counseling the patient, and independently interpreting results and communicating results to the patient/family/caregiver    Ventura Sellers, MD Medical Director of  Neuro-Oncology [facility] at [facility] 07/09/20 12:40 PM

## 2020-07-09 NOTE — Progress Notes (Signed)
07/09/2020 ok to treat with Avastin today with current labs and urine protein per Dr Mickeal Skinner

## 2020-07-23 ENCOUNTER — Ambulatory Visit: Payer: BC Managed Care – PPO

## 2020-07-23 ENCOUNTER — Ambulatory Visit: Payer: BC Managed Care – PPO | Admitting: Internal Medicine

## 2020-07-23 ENCOUNTER — Other Ambulatory Visit: Payer: BC Managed Care – PPO

## 2020-07-29 ENCOUNTER — Ambulatory Visit: Payer: BC Managed Care – PPO

## 2020-07-29 ENCOUNTER — Other Ambulatory Visit: Payer: BC Managed Care – PPO

## 2020-07-29 ENCOUNTER — Ambulatory Visit: Payer: BC Managed Care – PPO | Admitting: Internal Medicine

## 2020-07-29 DIAGNOSIS — H04123 Dry eye syndrome of bilateral lacrimal glands: Secondary | ICD-10-CM | POA: Diagnosis not present

## 2020-07-29 DIAGNOSIS — H53461 Homonymous bilateral field defects, right side: Secondary | ICD-10-CM | POA: Diagnosis not present

## 2020-07-29 DIAGNOSIS — C719 Malignant neoplasm of brain, unspecified: Secondary | ICD-10-CM | POA: Diagnosis not present

## 2020-07-29 DIAGNOSIS — H25813 Combined forms of age-related cataract, bilateral: Secondary | ICD-10-CM | POA: Diagnosis not present

## 2020-07-30 ENCOUNTER — Other Ambulatory Visit: Payer: Self-pay

## 2020-07-30 ENCOUNTER — Inpatient Hospital Stay: Payer: BC Managed Care – PPO

## 2020-07-30 ENCOUNTER — Inpatient Hospital Stay: Payer: BC Managed Care – PPO | Admitting: Internal Medicine

## 2020-07-30 VITALS — BP 136/82 | HR 76 | Temp 98.1°F | Resp 17 | Ht 62.0 in | Wt 103.1 lb

## 2020-07-30 DIAGNOSIS — Z79899 Other long term (current) drug therapy: Secondary | ICD-10-CM | POA: Diagnosis not present

## 2020-07-30 DIAGNOSIS — C719 Malignant neoplasm of brain, unspecified: Secondary | ICD-10-CM

## 2020-07-30 DIAGNOSIS — Z5112 Encounter for antineoplastic immunotherapy: Secondary | ICD-10-CM | POA: Diagnosis not present

## 2020-07-30 DIAGNOSIS — E7800 Pure hypercholesterolemia, unspecified: Secondary | ICD-10-CM | POA: Diagnosis not present

## 2020-07-30 DIAGNOSIS — E039 Hypothyroidism, unspecified: Secondary | ICD-10-CM | POA: Diagnosis not present

## 2020-07-30 DIAGNOSIS — C714 Malignant neoplasm of occipital lobe: Secondary | ICD-10-CM | POA: Diagnosis not present

## 2020-07-30 LAB — CBC WITH DIFFERENTIAL (CANCER CENTER ONLY)
Abs Immature Granulocytes: 0.02 10*3/uL (ref 0.00–0.07)
Basophils Absolute: 0.1 10*3/uL (ref 0.0–0.1)
Basophils Relative: 1 %
Eosinophils Absolute: 0.3 10*3/uL (ref 0.0–0.5)
Eosinophils Relative: 4 %
HCT: 38.6 % (ref 36.0–46.0)
Hemoglobin: 13 g/dL (ref 12.0–15.0)
Immature Granulocytes: 0 %
Lymphocytes Relative: 20 %
Lymphs Abs: 1.5 10*3/uL (ref 0.7–4.0)
MCH: 32.3 pg (ref 26.0–34.0)
MCHC: 33.7 g/dL (ref 30.0–36.0)
MCV: 95.8 fL (ref 80.0–100.0)
Monocytes Absolute: 0.5 10*3/uL (ref 0.1–1.0)
Monocytes Relative: 6 %
Neutro Abs: 5.1 10*3/uL (ref 1.7–7.7)
Neutrophils Relative %: 69 %
Platelet Count: 124 10*3/uL — ABNORMAL LOW (ref 150–400)
RBC: 4.03 MIL/uL (ref 3.87–5.11)
RDW: 12.3 % (ref 11.5–15.5)
WBC Count: 7.5 10*3/uL (ref 4.0–10.5)
nRBC: 0 % (ref 0.0–0.2)

## 2020-07-30 LAB — CMP (CANCER CENTER ONLY)
ALT: 31 U/L (ref 0–44)
AST: 35 U/L (ref 15–41)
Albumin: 3.4 g/dL — ABNORMAL LOW (ref 3.5–5.0)
Alkaline Phosphatase: 175 U/L — ABNORMAL HIGH (ref 38–126)
Anion gap: 10 (ref 5–15)
BUN: 27 mg/dL — ABNORMAL HIGH (ref 8–23)
CO2: 25 mmol/L (ref 22–32)
Calcium: 9.1 mg/dL (ref 8.9–10.3)
Chloride: 104 mmol/L (ref 98–111)
Creatinine: 1.08 mg/dL — ABNORMAL HIGH (ref 0.44–1.00)
GFR, Estimated: 57 mL/min — ABNORMAL LOW (ref >60)
Glucose, Bld: 86 mg/dL (ref 70–99)
Potassium: 4.2 mmol/L (ref 3.5–5.1)
Sodium: 139 mmol/L (ref 135–145)
Total Bilirubin: 0.5 mg/dL (ref 0.3–1.2)
Total Protein: 6.5 g/dL (ref 6.5–8.1)

## 2020-07-30 LAB — TOTAL PROTEIN, URINE DIPSTICK: Protein, ur: 300 mg/dL — AB

## 2020-07-30 MED ORDER — SODIUM CHLORIDE 0.9 % IV SOLN
Freq: Once | INTRAVENOUS | Status: AC
  Filled 2020-07-30: qty 250

## 2020-07-30 MED ORDER — SODIUM CHLORIDE 0.9 % IV SOLN
10.0000 mg/kg | Freq: Once | INTRAVENOUS | Status: AC
  Administered 2020-07-30: 450 mg via INTRAVENOUS
  Filled 2020-07-30: qty 16

## 2020-07-30 NOTE — Progress Notes (Signed)
Per Dr. Renda Rolls note from today, ok to treat with urine protein 300

## 2020-07-30 NOTE — Progress Notes (Signed)
[facility] at [place], [place] [address] [phone]   Interval Evaluation  Date of Service: 07/30/20 Patient Name: Jillian Gomez Patient MRN: [id number] Patient DOB: [birth year] Provider: Ventura Sellers, MD  Identifying Statement:  Jillian Gomez is a 65 y.o. female with left occipital glioblastoma   Oncologic History: Oncology History  Glioblastoma with isocitrate dehydrogenase gene wildtype ([place])  12/23/2018 Surgery   Craniotomy, left occipital resection by Dr. Kathyrn Sheriff.  Path is GBM IDH-wt   01/23/2019 - 03/03/2019 Radiation Therapy   IMRT with concurrent Temozolomide 47m/m2   03/28/2019 - 09/04/2019 Chemotherapy   5 cycles of adjuvant 5-day Temozolomide 2039mm2    09/01/2019 Progression   Progression of disease #1   09/19/2019 -  Chemotherapy   Begins second line therapy with oral CCNU 9092m2 q6 weeks, concurrent Avastin 29m48m IV q2 weeks     Biomarkers:  MGMT Unknown.  IDH 1/2 Wild type.  EGFR Unknown  TERT Unknown   Interval History:  Jillian COUSEsents today for evaluation following recent MRI brain. She denies new or progressive deficits today.  Stable right sided vision, no clear change from prior.  No right sided weakness. No seizures or headaches.  H+P (01/09/19) Patient presented to medical attention in early December with ~2 months history of right sided visual impairment.  She describes fuzzy or blurry vision on that side which was progressive over time, leading to at least one fall.  MRI brain demonstrated enhancing left occipital mass; this was subsequently resected by Dr. NundKathyrn Sheriff12/18/20.  She had no issues with surgery and has completed her steroid taper.  Continues on keppra daily but no history of any seizure. She has returned to work with only modest difficutly.  No issues walking or performing ADLs; lives alone but her sister and other family members are nearby.  Medications: Current  Outpatient Medications on File Prior to Visit  Medication Sig Dispense Refill   Biotin 1 MG CAPS Take 1 mg by mouth daily.     CALCIUM PO Take 1 tablet by mouth daily.     EUTHYROX 50 MCG tablet Take 50 mcg by mouth daily.     naproxen sodium (ALEVE) 220 MG tablet Take 1 tablet (220 mg total) by mouth daily as needed (pain).     omeprazole (PRILOSEC) 20 MG capsule Take 1 capsule (20 mg total) by mouth daily. 30 capsule 3   ondansetron (ZOFRAN) 8 MG tablet Take 1 tablet (8 mg total) by mouth 2 (two) times daily as needed. Start on the third day after chemotherapy. 30 tablet 1   doxycycline (VIBRA-TABS) 100 MG tablet Take 1 tablet by mouth daily. (Patient not taking: No sig reported)     No current facility-administered medications on file prior to visit.    Allergies:  Allergies  Allergen Reactions   Etodolac Other (See Comments)    GI upset   Macrobid [Nitrofurantoin] Rash   Past Medical History:  Past Medical History:  Diagnosis Date   High cholesterol    Hypothyroidism    Joint pain    Thrombocytopenia (HCC)[place]21/2022   Past Surgical History:  Past Surgical History:  Procedure Laterality Date   APPLICATION OF CRANIAL NAVIGATION Left 12/23/2018   Procedure: APPLICATION OF CRANIAL NAVIGATION;  Surgeon: NundConsuella Lose;  [facility];  Laterality: Left;  APPLICATION OF CRANIAL NAVIGATION   BREAST BIOPSY Left    COLONOSCOPY  CRANIOTOMY Left 12/23/2018   Procedure: STEREOTACTIC LEFT OCCIPITAL CRANIOTOMY FOR RESECTION OF TUMOR;  Surgeon: Consuella Lose, MD;  Location: [facility];  Service: Neurosurgery;  Laterality: Left;  STEREOTACTIC LEFT OCCIPITAL CRANIOTOMY FOR RESECTION OF TUMOR   LASIK     Social History:  Social History   Socioeconomic History   Marital status: Single    Spouse name: Not on file   Number of children: Not on file   Years of education: Not on file   Highest education level: Not on file  Occupational History   Not  on file  Tobacco Use   Smoking status: Never   Smokeless tobacco: Never  Vaping Use   Vaping Use: Never used  Substance and Sexual Activity   Alcohol use: Not Currently   Drug use: Never   Sexual activity: Not on file  Other Topics Concern   Not on file  Social History Narrative   Not on file   Social Determinants of Health   Financial Resource Strain: Not on file  Food Insecurity: Not on file  Transportation Needs: Not on file  Physical Activity: Not on file  Stress: Not on file  Social Connections: Not on file  Intimate Partner Violence: Not on file   Family History: No family history on file.  Review of Systems: Constitutional: Denies fevers, chills or abnormal weight loss Eyes: Denies blurriness of vision Gastrointestinal:  Denies nausea, constipation, diarrhea GU: Denies dysuria or incontinence Skin: Denies abnormal skin rashes Musculoskeletal: Denies joint pain, back or neck discomfort.  Behavioral/Psych: Denies anxiety, mood instability  Physical Exam: Vitals:   07/30/20 1246  BP: 136/82  Pulse: 76  Resp: 17  Temp: 98.1 F (36.7 C)  SpO2: 100%   KPS: 80. General: Alert, cooperative, pleasant, in no acute distress Head: Craniotomy scar noted, dry and intact. EENT: No conjunctival injection or scleral icterus. Oral mucosa moist Lungs: Resp effort normal Cardiac: Regular rate and rhythm Abdomen: Soft, non-distended abdomen Skin: No rashes cyanosis or petechiae. Extremities: No clubbing or edema  Neurologic Exam: Mental Status: Awake, alert, attentive to examiner. Oriented to self and environment. Language is notable for mild transcortical expressive dyshpasia. Cranial Nerves: Visual acuity is grossly normal. Right homonymous hemianopia. Extra-ocular movements intact. No ptosis. Face is symmetric, tongue midline. Motor: Tone and bulk are normal. Power is full in both arms and legs. Reflexes are symmetric, no pathologic reflexes present. Intact finger to  nose bilaterally Sensory: Intact to light touch and temperature Gait: Normal and tandem gait is deferred.   Labs: I have reviewed the data as listed    Component Value Date/Time   NA 140 07/09/2020 1209   K 4.3 07/09/2020 1209   CL 106 07/09/2020 1209   CO2 24 07/09/2020 1209   GLUCOSE 78 07/09/2020 1209   BUN 35 (H) 07/09/2020 1209   CREATININE 1.13 (H) 07/09/2020 1209   CALCIUM 9.6 07/09/2020 1209   PROT 6.8 07/09/2020 1209   ALBUMIN 3.5 07/09/2020 1209   AST 40 07/09/2020 1209   ALT 46 (H) 07/09/2020 1209   ALKPHOS 214 (H) 07/09/2020 1209   BILITOT 0.5 07/09/2020 1209   GFRNONAA 54 (L) 07/09/2020 1209   GFRAA >60 10/03/2019 1133   Lab Results  Component Value Date   WBC 7.5 07/30/2020   NEUTROABS 5.1 07/30/2020   HGB 13.0 07/30/2020   HCT 38.6 07/30/2020   MCV 95.8 07/30/2020   PLT 124 (L) 07/30/2020    Assessment/Plan Glioblastoma, IDH-1 WT   Nadyne Coombes  is clinically stable today.  Labs continue to demonstrate modest proteinuria, still within appropriate range for avastin.  We will continue with single agent Avastin, given good response and cytopenias with cytotoxic chemotherapy.   Avastin should be held for the following:   ANC less than 500  Platelets less than 50,000  LFT or creatinine greater than 2x ULN  If clinical concerns/contraindications develop  We ask that NANA VASTINE return to clinic in 2 weeks with labs for review prior to next infusion.  Next MRI can be for 08/23/20.  All questions were answered. The patient knows to call the clinic with any problems, questions or concerns. No barriers to learning were detected.  I have spent a total of 30 minutes of face-to-face and non-face-to-face time, excluding clinical staff time, preparing to see patient, ordering tests and/or medications, counseling the patient, and independently interpreting results and communicating results to the patient/family/caregiver    Ventura Sellers, MD Medical  Director of Neuro-Oncology [facility] [place] 07/30/20 1:14 PM

## 2020-08-06 ENCOUNTER — Other Ambulatory Visit: Payer: BC Managed Care – PPO

## 2020-08-06 ENCOUNTER — Ambulatory Visit: Payer: BC Managed Care – PPO | Admitting: Internal Medicine

## 2020-08-06 ENCOUNTER — Ambulatory Visit: Payer: BC Managed Care – PPO

## 2020-08-13 ENCOUNTER — Other Ambulatory Visit: Payer: Self-pay | Admitting: *Deleted

## 2020-08-13 ENCOUNTER — Inpatient Hospital Stay: Payer: BC Managed Care – PPO | Admitting: Internal Medicine

## 2020-08-13 ENCOUNTER — Other Ambulatory Visit: Payer: Self-pay

## 2020-08-13 ENCOUNTER — Inpatient Hospital Stay: Payer: BC Managed Care – PPO | Attending: Internal Medicine

## 2020-08-13 ENCOUNTER — Inpatient Hospital Stay: Payer: BC Managed Care – PPO

## 2020-08-13 VITALS — BP 127/80 | HR 81 | Temp 97.5°F | Resp 17 | Ht 62.0 in | Wt 102.1 lb

## 2020-08-13 DIAGNOSIS — C719 Malignant neoplasm of brain, unspecified: Secondary | ICD-10-CM

## 2020-08-13 DIAGNOSIS — E039 Hypothyroidism, unspecified: Secondary | ICD-10-CM | POA: Insufficient documentation

## 2020-08-13 DIAGNOSIS — Z79899 Other long term (current) drug therapy: Secondary | ICD-10-CM | POA: Diagnosis not present

## 2020-08-13 DIAGNOSIS — Z5112 Encounter for antineoplastic immunotherapy: Secondary | ICD-10-CM | POA: Diagnosis not present

## 2020-08-13 DIAGNOSIS — E7800 Pure hypercholesterolemia, unspecified: Secondary | ICD-10-CM | POA: Insufficient documentation

## 2020-08-13 DIAGNOSIS — C714 Malignant neoplasm of occipital lobe: Secondary | ICD-10-CM | POA: Diagnosis not present

## 2020-08-13 DIAGNOSIS — Z5189 Encounter for other specified aftercare: Secondary | ICD-10-CM | POA: Diagnosis not present

## 2020-08-13 LAB — CBC WITH DIFFERENTIAL (CANCER CENTER ONLY)
Abs Immature Granulocytes: 0.02 10*3/uL (ref 0.00–0.07)
Basophils Absolute: 0.1 10*3/uL (ref 0.0–0.1)
Basophils Relative: 1 %
Eosinophils Absolute: 0.3 10*3/uL (ref 0.0–0.5)
Eosinophils Relative: 4 %
HCT: 37.6 % (ref 36.0–46.0)
Hemoglobin: 12.8 g/dL (ref 12.0–15.0)
Immature Granulocytes: 0 %
Lymphocytes Relative: 17 %
Lymphs Abs: 1.4 10*3/uL (ref 0.7–4.0)
MCH: 32.2 pg (ref 26.0–34.0)
MCHC: 34 g/dL (ref 30.0–36.0)
MCV: 94.7 fL (ref 80.0–100.0)
Monocytes Absolute: 0.6 10*3/uL (ref 0.1–1.0)
Monocytes Relative: 7 %
Neutro Abs: 5.7 10*3/uL (ref 1.7–7.7)
Neutrophils Relative %: 71 %
Platelet Count: 122 10*3/uL — ABNORMAL LOW (ref 150–400)
RBC: 3.97 MIL/uL (ref 3.87–5.11)
RDW: 12.3 % (ref 11.5–15.5)
WBC Count: 8.1 10*3/uL (ref 4.0–10.5)
nRBC: 0 % (ref 0.0–0.2)

## 2020-08-13 LAB — CMP (CANCER CENTER ONLY)
ALT: 29 U/L (ref 0–44)
AST: 35 U/L (ref 15–41)
Albumin: 3.3 g/dL — ABNORMAL LOW (ref 3.5–5.0)
Alkaline Phosphatase: 165 U/L — ABNORMAL HIGH (ref 38–126)
Anion gap: 8 (ref 5–15)
BUN: 31 mg/dL — ABNORMAL HIGH (ref 8–23)
CO2: 25 mmol/L (ref 22–32)
Calcium: 9.3 mg/dL (ref 8.9–10.3)
Chloride: 107 mmol/L (ref 98–111)
Creatinine: 1.16 mg/dL — ABNORMAL HIGH (ref 0.44–1.00)
GFR, Estimated: 53 mL/min — ABNORMAL LOW (ref >60)
Glucose, Bld: 94 mg/dL (ref 70–99)
Potassium: 4.4 mmol/L (ref 3.5–5.1)
Sodium: 140 mmol/L (ref 135–145)
Total Bilirubin: 0.5 mg/dL (ref 0.3–1.2)
Total Protein: 6.2 g/dL — ABNORMAL LOW (ref 6.5–8.1)

## 2020-08-13 LAB — TOTAL PROTEIN, URINE DIPSTICK: Protein, ur: 300 mg/dL — AB

## 2020-08-13 MED ORDER — SODIUM CHLORIDE 0.9 % IV SOLN
Freq: Once | INTRAVENOUS | Status: AC
  Filled 2020-08-13: qty 250

## 2020-08-13 MED ORDER — SODIUM CHLORIDE 0.9 % IV SOLN
10.0000 mg/kg | Freq: Once | INTRAVENOUS | Status: AC
  Administered 2020-08-13: 450 mg via INTRAVENOUS
  Filled 2020-08-13: qty 4

## 2020-08-13 NOTE — Progress Notes (Signed)
[facility] at [name], [place] [address] [phone]   Interval Evaluation  Date of Service: 08/13/20 Patient Name: Jillian Gomez Patient MRN: [id number] Patient DOB: 01/21/1955 Provider: Ventura Sellers, MD  Identifying Statement:  Jillian Gomez is a 65 y.o. female with left occipital glioblastoma   Oncologic History: Oncology History  Glioblastoma with isocitrate dehydrogenase gene wildtype ([place])  12/23/2018 Surgery   Craniotomy, left occipital resection by Dr. Kathyrn Sheriff.  Path is GBM IDH-wt   01/23/2019 - 03/03/2019 Radiation Therapy   IMRT with concurrent Temozolomide 107m/m2   03/28/2019 - 09/04/2019 Chemotherapy   5 cycles of adjuvant 5-day Temozolomide 208mm2    09/01/2019 Progression   Progression of disease #1   09/19/2019 -  Chemotherapy   Begins second line therapy with oral CCNU 905m2 q6 weeks, concurrent Avastin 6m69m IV q2 weeks     Biomarkers:  MGMT Unknown.  IDH 1/2 Wild type.  EGFR Unknown  TERT Unknown   Interval History:  Jillian TROWBRIDGEsents today for evaluation following recent MRI brain. No new or progressive neurologic complaints.  Stable right sided vision, no clear change from prior.  No right sided weakness. No seizures or headaches.  H+P (01/09/19) Patient presented to medical attention in early December with ~2 months history of right sided visual impairment.  She describes fuzzy or blurry vision on that side which was progressive over time, leading to at least one fall.  MRI brain demonstrated enhancing left occipital mass; this was subsequently resected by Dr. NundKathyrn Sheriff12/18/20.  She had no issues with surgery and has completed her steroid taper.  Continues on keppra daily but no history of any seizure. She has returned to work with only modest difficutly.  No issues walking or performing ADLs; lives alone but her sister and other family members are nearby.  Medications: Current  Outpatient Medications on File Prior to Visit  Medication Sig Dispense Refill   Biotin 1 MG CAPS Take 1 mg by mouth daily.     CALCIUM PO Take 1 tablet by mouth daily.     doxycycline (VIBRA-TABS) 100 MG tablet Take 1 tablet by mouth daily. (Patient not taking: No sig reported)     EUTHYROX 50 MCG tablet Take 50 mcg by mouth daily.     naproxen sodium (ALEVE) 220 MG tablet Take 1 tablet (220 mg total) by mouth daily as needed (pain).     omeprazole (PRILOSEC) 20 MG capsule Take 1 capsule (20 mg total) by mouth daily. 30 capsule 3   ondansetron (ZOFRAN) 8 MG tablet Take 1 tablet (8 mg total) by mouth 2 (two) times daily as needed. Start on the third day after chemotherapy. 30 tablet 1   No current facility-administered medications on file prior to visit.    Allergies:  Allergies  Allergen Reactions   Etodolac Other (See Comments)    GI upset   Macrobid [Nitrofurantoin] Rash   Past Medical History:  Past Medical History:  Diagnosis Date   High cholesterol    Hypothyroidism    Joint pain    Thrombocytopenia (HCC)[place]21/2022   Past Surgical History:  Past Surgical History:  Procedure Laterality Date   APPLICATION OF CRANIAL NAVIGATION Left 12/23/2018   Procedure: APPLICATION OF CRANIAL NAVIGATION;  Surgeon: NundConsuella Lose;  [facility];  Laterality: Left;  APPLICATION OF CRANIAL NAVIGATION   BREAST BIOPSY Left    COLONOSCOPY  CRANIOTOMY Left 12/23/2018   Procedure: STEREOTACTIC LEFT OCCIPITAL CRANIOTOMY FOR RESECTION OF TUMOR;  Surgeon: Consuella Lose, MD;  Location: [facility];  Service: Neurosurgery;  Laterality: Left;  STEREOTACTIC LEFT OCCIPITAL CRANIOTOMY FOR RESECTION OF TUMOR   LASIK     Social History:  Social History   Socioeconomic History   Marital status: Single    Spouse name: Not on file   Number of children: Not on file   Years of education: Not on file   Highest education level: Not on file  Occupational History   Not  on file  Tobacco Use   Smoking status: Never   Smokeless tobacco: Never  Vaping Use   Vaping Use: Never used  Substance and Sexual Activity   Alcohol use: Not Currently   Drug use: Never   Sexual activity: Not on file  Other Topics Concern   Not on file  Social History Narrative   Not on file   Social Determinants of Health   Financial Resource Strain: Not on file  Food Insecurity: Not on file  Transportation Needs: Not on file  Physical Activity: Not on file  Stress: Not on file  Social Connections: Not on file  Intimate Partner Violence: Not on file   Family History: No family history on file.  Review of Systems: Constitutional: Denies fevers, chills or abnormal weight loss Eyes: Denies blurriness of vision Gastrointestinal:  Denies nausea, constipation, diarrhea GU: Denies dysuria or incontinence Skin: Denies abnormal skin rashes Musculoskeletal: Denies joint pain, back or neck discomfort.  Behavioral/Psych: Denies anxiety, mood instability  Physical Exam: Vitals:   08/13/20 1226  BP: 127/80  Pulse: 81  Resp: 17  Temp: (!) 97.5 F (36.4 C)  SpO2: 100%   KPS: 80. General: Alert, cooperative, pleasant, in no acute distress Head: Craniotomy scar noted, dry and intact. EENT: No conjunctival injection or scleral icterus. Oral mucosa moist Lungs: Resp effort normal Cardiac: Regular rate and rhythm Abdomen: Soft, non-distended abdomen Skin: No rashes cyanosis or petechiae. Extremities: No clubbing or edema  Neurologic Exam: Mental Status: Awake, alert, attentive to examiner. Oriented to self and environment. Language is notable for mild transcortical expressive dyshpasia. Cranial Nerves: Visual acuity is grossly normal. Right homonymous hemianopia. Extra-ocular movements intact. No ptosis. Face is symmetric, tongue midline. Motor: Tone and bulk are normal. Power is full in both arms and legs. Reflexes are symmetric, no pathologic reflexes present. Intact finger  to nose bilaterally Sensory: Intact to light touch and temperature Gait: Normal and tandem gait is deferred.   Labs: I have reviewed the data as listed    Component Value Date/Time   NA 139 07/30/2020 1218   K 4.2 07/30/2020 1218   CL 104 07/30/2020 1218   CO2 25 07/30/2020 1218   GLUCOSE 86 07/30/2020 1218   BUN 27 (H) 07/30/2020 1218   CREATININE 1.08 (H) 07/30/2020 1218   CALCIUM 9.1 07/30/2020 1218   PROT 6.5 07/30/2020 1218   ALBUMIN 3.4 (L) 07/30/2020 1218   AST 35 07/30/2020 1218   ALT 31 07/30/2020 1218   ALKPHOS 175 (H) 07/30/2020 1218   BILITOT 0.5 07/30/2020 1218   GFRNONAA 57 (L) 07/30/2020 1218   GFRAA >60 10/03/2019 1133   Lab Results  Component Value Date   WBC 8.1 08/13/2020   NEUTROABS 5.7 08/13/2020   HGB 12.8 08/13/2020   HCT 37.6 08/13/2020   MCV 94.7 08/13/2020   PLT 122 (L) 08/13/2020    Assessment/Plan Glioblastoma, IDH-1 WT   Jillian American  Gomez is clinically stable today.  Labs continue to demonstrate modest proteinuria, still within appropriate range for avastin.  No new or progressive deficits.  We will continue with single agent Avastin, given good response and cytopenias with cytotoxic chemotherapy.   Avastin should be held for the following:   ANC less than 500  Platelets less than 50,000  LFT or creatinine greater than 2x ULN  If clinical concerns/contraindications develop  We ask that Jillian Gomez return to clinic in 2 weeks with labs for review prior to next infusion.  MRI scheduled for 08/23/20.  All questions were answered. The patient knows to call the clinic with any problems, questions or concerns. No barriers to learning were detected.  I have spent a total of 30 minutes of face-to-face and non-face-to-face time, excluding clinical staff time, preparing to see patient, ordering tests and/or medications, counseling the patient, and independently interpreting results and communicating results to the  patient/family/caregiver    Ventura Sellers, MD Medical Director of Neuro-Oncology [facility] at [place] 08/13/20 12:34 PM

## 2020-08-13 NOTE — Patient Instructions (Signed)
[facility] MEDICAL ONCOLOGY  Discharge Instructions: °Thank you for choosing [facility] to provide your oncology and hematology care.  ° °If you have a lab appointment with the Cancer Center, please go directly to the Cancer Center and check in at the registration area. °  °Wear comfortable clothing and clothing appropriate for easy access to any Portacath or PICC line.  ° °We strive to give you quality time with your provider. You may need to reschedule your appointment if you arrive late (15 or more minutes).  Arriving late affects you and other patients whose appointments are after yours.  Also, if you miss three or more appointments without notifying the office, you may be dismissed from the clinic at the provider’s discretion.    °  °For prescription refill requests, have your pharmacy contact our office and allow 72 hours for refills to be completed.   ° °Today you received the following chemotherapy and/or immunotherapy agents: bevacizumab    °  °To help prevent nausea and vomiting after your treatment, we encourage you to take your nausea medication as directed. ° °BELOW ARE SYMPTOMS THAT SHOULD BE REPORTED IMMEDIATELY: °*FEVER GREATER THAN 100.4 F (38 °C) OR HIGHER °*CHILLS OR SWEATING °*NAUSEA AND VOMITING THAT IS NOT CONTROLLED WITH YOUR NAUSEA MEDICATION °*UNUSUAL SHORTNESS OF BREATH °*UNUSUAL BRUISING OR BLEEDING °*URINARY PROBLEMS (pain or burning when urinating, or frequent urination) °*BOWEL PROBLEMS (unusual diarrhea, constipation, pain near the anus) °TENDERNESS IN MOUTH AND THROAT WITH OR WITHOUT PRESENCE OF ULCERS (sore throat, sores in mouth, or a toothache) °UNUSUAL RASH, SWELLING OR PAIN  °UNUSUAL VAGINAL DISCHARGE OR ITCHING  ° °Items with * indicate a potential emergency and should be followed up as soon as possible or go to the Emergency Department if any problems should occur. ° °Please show the CHEMOTHERAPY ALERT CARD or IMMUNOTHERAPY ALERT CARD at check-in  to the Emergency Department and triage nurse. ° °Should you have questions after your visit or need to cancel or reschedule your appointment, please contact [facility] MEDICAL ONCOLOGY  Dept: [phone]  and follow the prompts.  Office hours are 8:00 a.m. to 4:30 p.m. Monday - Friday. Please note that voicemails left after 4:00 p.m. may not be returned until the following business day.  We are closed weekends and major holidays. You have access to a nurse at all times for urgent questions. Please call the main number to the clinic Dept: [phone] and follow the prompts. ° ° °For any non-urgent questions, you may also contact your provider using MyChart. We now offer e-Visits for anyone 18 and older to request care online for non-urgent symptoms. For details visit [email or web address]. °  °Also download the MyChart app! Go to the app store, search "MyChart", open the app, select [facility], and log in with your MyChart username and password. ° °Due to Covid, a mask is required upon entering the hospital/clinic. If you do not have a mask, one will be given to you upon arrival. For doctor visits, patients may have 1 support person aged 18 or older with them. For treatment visits, patients cannot have anyone with them due to current Covid guidelines and our immunocompromised population.  ° °

## 2020-08-15 ENCOUNTER — Other Ambulatory Visit: Payer: Self-pay | Admitting: Radiation Therapy

## 2020-08-23 ENCOUNTER — Ambulatory Visit (HOSPITAL_COMMUNITY)
Admission: RE | Admit: 2020-08-23 | Discharge: 2020-08-23 | Disposition: A | Discharge status: Home / Self Care | Payer: BC Managed Care – PPO | Source: Ambulatory Visit | Attending: Internal Medicine | Admitting: Internal Medicine

## 2020-08-23 ENCOUNTER — Other Ambulatory Visit: Payer: Self-pay

## 2020-08-23 DIAGNOSIS — C719 Malignant neoplasm of brain, unspecified: Secondary | ICD-10-CM | POA: Insufficient documentation

## 2020-08-23 DIAGNOSIS — R220 Localized swelling, mass and lump, head: Secondary | ICD-10-CM | POA: Diagnosis not present

## 2020-08-23 IMAGING — MR MR HEAD WO/W CM
15 series · 48 of 48 positions shown · IV contrast (gadavist)
Comparison: [DATE]

CLINICAL DATA: Follow-up glioblastoma

EXAM:
MRI HEAD WITHOUT AND WITH CONTRAST
TECHNIQUE: Multiplanar, multiecho pulse sequences of the brain and surrounding
structures were obtained without and with intravenous contrast.
CONTRAST:  5mL GADAVIST GADOBUTROL 1 MMOL/ML IV SOLN

[Series 5: DWI · axial · 3.0mm · 1.36mm/px · z∈[-56,+83]mm · 5 of 96 slices shown (1 of 2)]
[im 1/96]
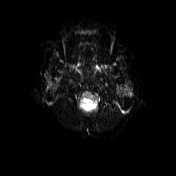
[im 24/96]
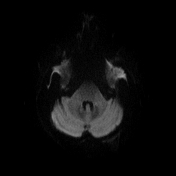
[im 48/96]
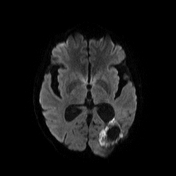
[im 72/96]
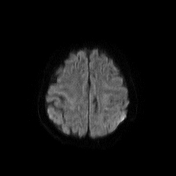
[im 96/96]
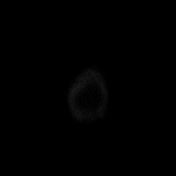

[Series 6: DWI · axial · 3.0mm · 1.36mm/px · z∈[-56,+83]mm · 3 of 48 slices shown (2 of 2)]
[im 1/48]
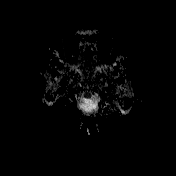
[im 24/48]
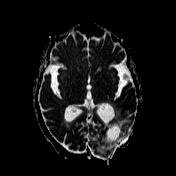
[im 48/48]
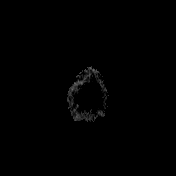

[Series 7: T1 · sagittal · 5.0mm · 0.75mm/px · 1 of 24 slices shown (1 of 4)]
[im 1/24]
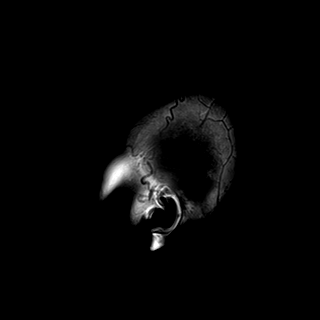

[Series 8: T2 · axial · 5.0mm · 0.62mm/px · 1 of 24 slices shown]
[im 1/24]
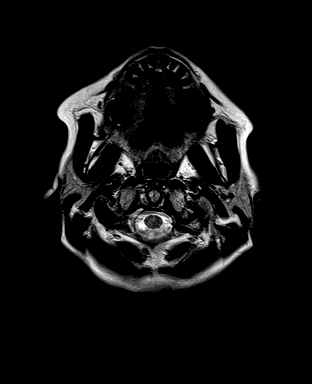

[Series 9: swi_images · axial · 3.0mm · 0.75mm/px · z∈[-66,+85]mm · 3 of 52 slices shown]
[im 1/52]
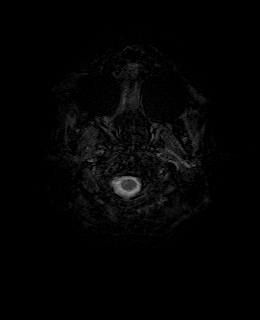
[im 26/52]
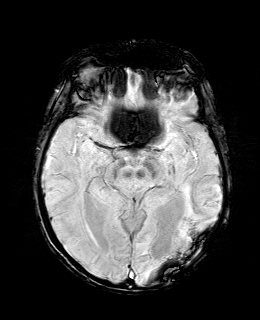
[im 52/52]
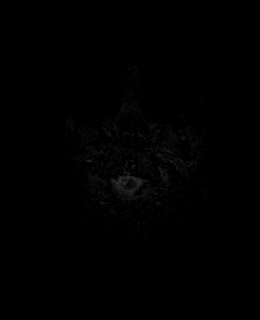

[Series 11: FLAIR · axial · 3.0mm · 0.75mm/px · z∈[-66,+85]mm · 3 of 52 slices shown]
[im 1/52]
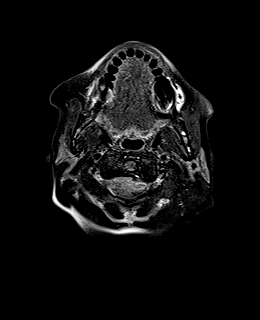
[im 26/52]
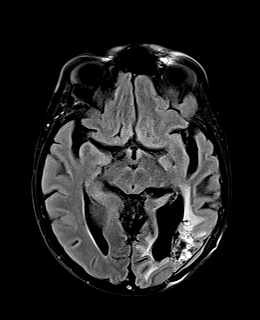
[im 52/52]
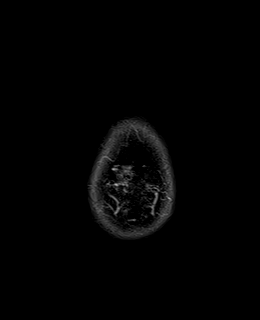

[Series 12: T1 · axial · 1.0mm · 0.94mm/px · z∈[-65,+92]mm · 9 of 160 slices shown (2 of 4)]
[im 1/160]
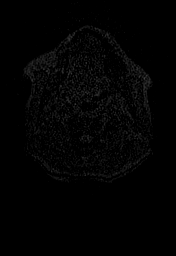
[im 20/160]
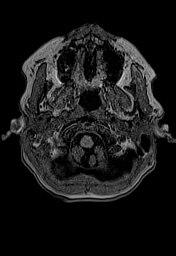
[im 40/160]
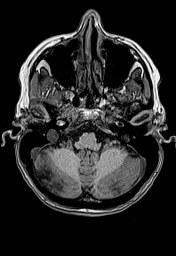
[im 60/160]
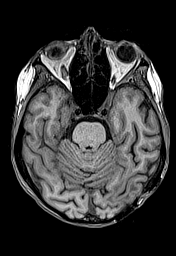
[im 80/160]
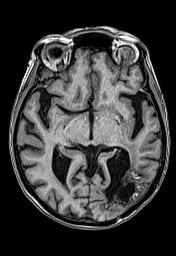
[im 100/160]
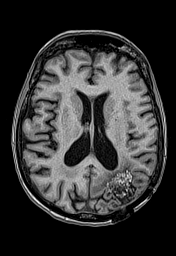
[im 120/160]
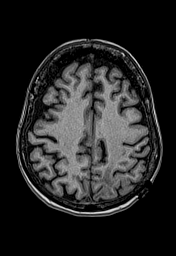
[im 140/160]
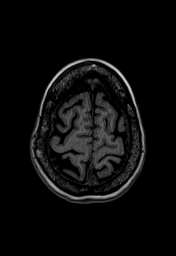
[im 160/160]
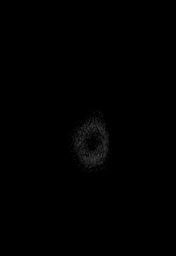

[Series 13: cor dwi_tracew · coronal · 5.0mm · 1.53mm/px · 3 of 58 slices shown]
[im 1/58]
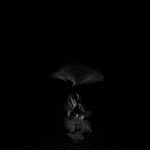
[im 29/58]
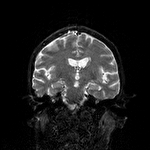
[im 58/58]
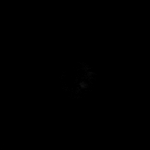

[Series 14: cor dwi_adc · coronal · 5.0mm · 1.53mm/px · 2 of 29 slices shown]
[im 1/29]
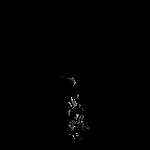
[im 29/29]
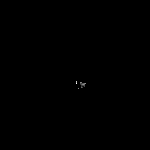

[Series 15: T2 post-contrast · coronal · 5.0mm · 0.57mm/px · 2 of 29 slices shown]
[im 1/29]
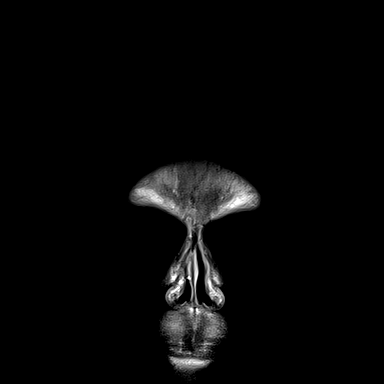
[im 29/29]
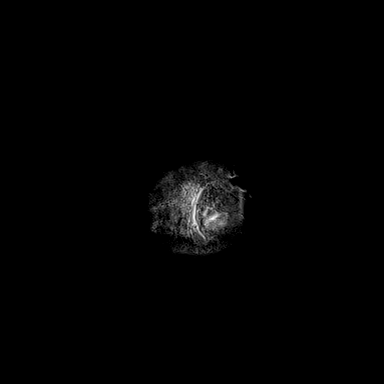

[Series 16: T1 post-contrast · axial · 1.0mm · 0.94mm/px · z∈[-65,+92]mm · 9 of 160 slices shown (1 of 3)]
[im 1/160]
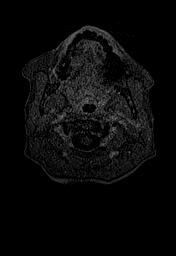
[im 20/160]
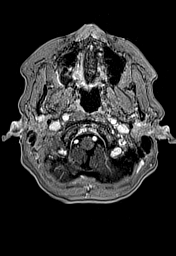
[im 40/160]
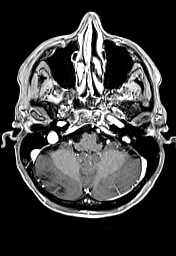
[im 60/160]
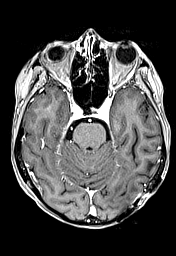
[im 80/160]
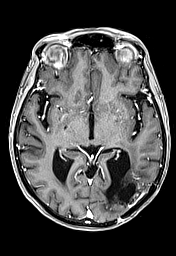
[im 100/160]
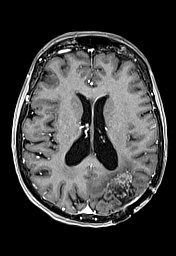
[im 120/160]
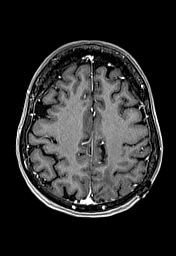
[im 140/160]
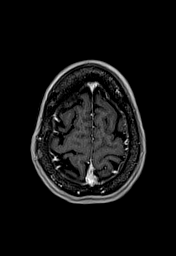
[im 160/160]
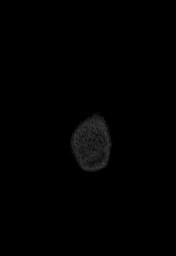

[Series 17: T1 · sagittal · 4.0mm · 0.94mm/px · 2 of 35 slices shown (3 of 4)]
[im 1/35]
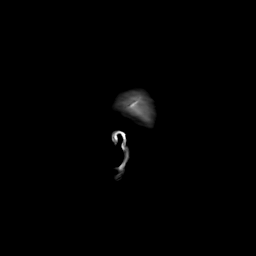
[im 35/35]
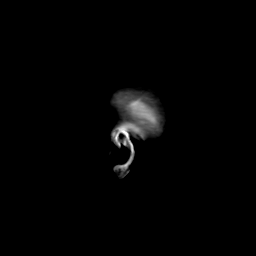

[Series 18: T1 · coronal · 4.0mm · 0.94mm/px · 2 of 45 slices shown (4 of 4)]
[im 1/45]
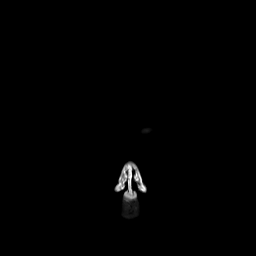
[im 45/45]
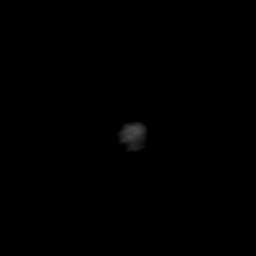

[Series 19: T1 post-contrast · coronal · 5.0mm · 0.43mm/px · 2 of 29 slices shown (2 of 3)]
[im 1/29]
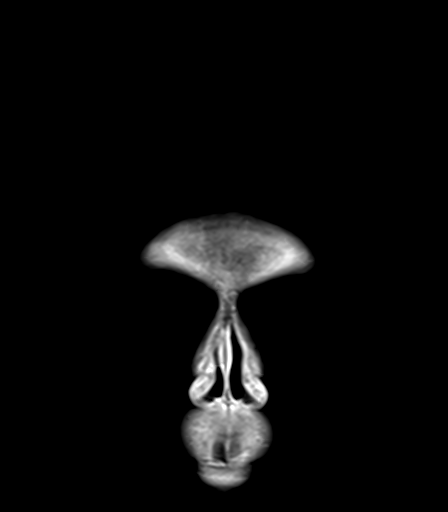
[im 29/29]
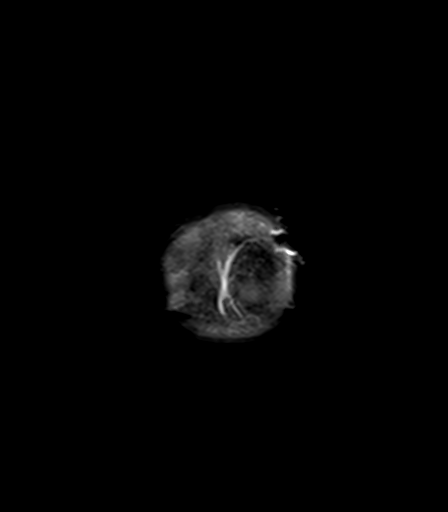

[Series 20: T1 post-contrast · sagittal · 5.0mm · 0.75mm/px · 1 of 26 slices shown (3 of 3)]
[im 1/26]
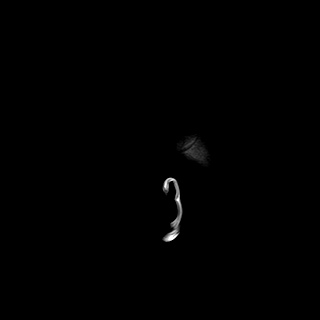

[48 of 48 positions shown; findings below may reference images not displayed]

FINDINGS: Brain: Left occipital parietal mass with resection cavity and
peripheral mineralization. Limited enhancement in the setting of
Avastin. There is a nodule along the atrium of the left lateral
ventricle which shows no clear enhancement but does restrict
diffusion and is T2 hypointense, pattern likely related to tumor
with Avastin changes. The nodule measures 11 mm today and was only a
thin crescent previously.

T2 signal in the treatment region and elsewhere is unchanged. No
acute hemorrhage, hydrocephalus, or collection.

Vascular: Normal flow voids

Skull and upper cervical spine: Normal marrow signal

Sinuses/Orbits: Negative
IMPRESSION: Small but intervally enlarged left periatrial nodule most apparent
on diffusion imaging in the setting of Avastin use. No progressive
finding at the resection cavity.

## 2020-08-23 MED ORDER — GADOBUTROL 1 MMOL/ML IV SOLN
5.0000 mL | Freq: Once | INTRAVENOUS | Status: AC | PRN
  Administered 2020-08-23: 5 mL via INTRAVENOUS

## 2020-08-26 ENCOUNTER — Inpatient Hospital Stay: Payer: BC Managed Care – PPO

## 2020-08-27 ENCOUNTER — Other Ambulatory Visit (HOSPITAL_COMMUNITY): Payer: Self-pay

## 2020-08-27 ENCOUNTER — Inpatient Hospital Stay: Payer: BC Managed Care – PPO | Admitting: Internal Medicine

## 2020-08-27 ENCOUNTER — Inpatient Hospital Stay: Payer: BC Managed Care – PPO

## 2020-08-27 ENCOUNTER — Other Ambulatory Visit: Payer: Self-pay

## 2020-08-27 ENCOUNTER — Telehealth: Payer: Self-pay | Admitting: Pharmacist

## 2020-08-27 VITALS — BP 157/77 | HR 68 | Temp 97.7°F | Resp 18 | Ht 62.0 in | Wt 101.4 lb

## 2020-08-27 DIAGNOSIS — C719 Malignant neoplasm of brain, unspecified: Secondary | ICD-10-CM

## 2020-08-27 DIAGNOSIS — E039 Hypothyroidism, unspecified: Secondary | ICD-10-CM | POA: Diagnosis not present

## 2020-08-27 DIAGNOSIS — Z79899 Other long term (current) drug therapy: Secondary | ICD-10-CM | POA: Diagnosis not present

## 2020-08-27 DIAGNOSIS — Z5189 Encounter for other specified aftercare: Secondary | ICD-10-CM | POA: Diagnosis not present

## 2020-08-27 DIAGNOSIS — Z5112 Encounter for antineoplastic immunotherapy: Secondary | ICD-10-CM | POA: Diagnosis not present

## 2020-08-27 DIAGNOSIS — C714 Malignant neoplasm of occipital lobe: Secondary | ICD-10-CM | POA: Diagnosis not present

## 2020-08-27 DIAGNOSIS — E7800 Pure hypercholesterolemia, unspecified: Secondary | ICD-10-CM | POA: Diagnosis not present

## 2020-08-27 LAB — CMP (CANCER CENTER ONLY)
ALT: 25 U/L (ref 0–44)
AST: 28 U/L (ref 15–41)
Albumin: 3.4 g/dL — ABNORMAL LOW (ref 3.5–5.0)
Alkaline Phosphatase: 143 U/L — ABNORMAL HIGH (ref 38–126)
Anion gap: 10 (ref 5–15)
BUN: 25 mg/dL — ABNORMAL HIGH (ref 8–23)
CO2: 25 mmol/L (ref 22–32)
Calcium: 9.6 mg/dL (ref 8.9–10.3)
Chloride: 107 mmol/L (ref 98–111)
Creatinine: 1.2 mg/dL — ABNORMAL HIGH (ref 0.44–1.00)
GFR, Estimated: 51 mL/min — ABNORMAL LOW (ref >60)
Glucose, Bld: 75 mg/dL (ref 70–99)
Potassium: 4 mmol/L (ref 3.5–5.1)
Sodium: 142 mmol/L (ref 135–145)
Total Bilirubin: 0.5 mg/dL (ref 0.3–1.2)
Total Protein: 6.4 g/dL — ABNORMAL LOW (ref 6.5–8.1)

## 2020-08-27 LAB — CBC WITH DIFFERENTIAL (CANCER CENTER ONLY)
Abs Immature Granulocytes: 0.01 10*3/uL (ref 0.00–0.07)
Basophils Absolute: 0 10*3/uL (ref 0.0–0.1)
Basophils Relative: 1 %
Eosinophils Absolute: 0.3 10*3/uL (ref 0.0–0.5)
Eosinophils Relative: 6 %
HCT: 38.2 % (ref 36.0–46.0)
Hemoglobin: 13.1 g/dL (ref 12.0–15.0)
Immature Granulocytes: 0 %
Lymphocytes Relative: 22 %
Lymphs Abs: 1.1 10*3/uL (ref 0.7–4.0)
MCH: 32 pg (ref 26.0–34.0)
MCHC: 34.3 g/dL (ref 30.0–36.0)
MCV: 93.4 fL (ref 80.0–100.0)
Monocytes Absolute: 0.5 10*3/uL (ref 0.1–1.0)
Monocytes Relative: 9 %
Neutro Abs: 3.2 10*3/uL (ref 1.7–7.7)
Neutrophils Relative %: 62 %
Platelet Count: 129 10*3/uL — ABNORMAL LOW (ref 150–400)
RBC: 4.09 MIL/uL (ref 3.87–5.11)
RDW: 12.4 % (ref 11.5–15.5)
WBC Count: 5.1 10*3/uL (ref 4.0–10.5)
nRBC: 0 % (ref 0.0–0.2)

## 2020-08-27 LAB — TOTAL PROTEIN, URINE DIPSTICK: Protein, ur: 300 mg/dL — AB

## 2020-08-27 MED ORDER — BEVACIZUMAB-AWWB CHEMO INJECTION 400 MG/16ML
10.0000 mg/kg | Freq: Once | INTRAVENOUS | Status: AC
  Administered 2020-08-27: 450 mg via INTRAVENOUS
  Filled 2020-08-27: qty 16

## 2020-08-27 MED ORDER — LOMUSTINE 40 MG PO CAPS
40.0000 mg | ORAL_CAPSULE | Freq: Once | ORAL | 0 refills | Status: DC
  Filled 2020-08-27: qty 1, 1d supply, fill #0

## 2020-08-27 MED ORDER — LOMUSTINE 100 MG PO CAPS
100.0000 mg | ORAL_CAPSULE | Freq: Once | ORAL | 0 refills | Status: AC
  Filled 2020-08-27 – 2020-08-28 (×2): qty 1, 1d supply, fill #0

## 2020-08-27 MED ORDER — SODIUM CHLORIDE 0.9 % IV SOLN: Freq: Once | INTRAVENOUS | Status: AC

## 2020-08-27 NOTE — Progress Notes (Signed)
[facility] [place], [place] [address] [phone]   Interval Evaluation  Date of Service: 08/27/20 Patient Name: Jillian Gomez Patient MRN: [id number] Patient DOB: 12-12-1955 Provider: Ventura Sellers, MD  Identifying Statement:  Jillian Gomez is a 65 y.o. female with left occipital glioblastoma   Oncologic History: Oncology History  Glioblastoma with isocitrate dehydrogenase gene wildtype ([place])  12/23/2018 Surgery   Craniotomy, left occipital resection by Dr. Kathyrn Sheriff.  Path is GBM IDH-wt   01/23/2019 - 03/03/2019 Radiation Therapy   IMRT with concurrent Temozolomide 44m/m2   03/28/2019 - 09/04/2019 Chemotherapy   5 cycles of adjuvant 5-day Temozolomide 2077mm2    09/01/2019 Progression   Progression of disease #1   09/19/2019 -  Chemotherapy   Begins second line therapy with oral CCNU 9031m2 q6 weeks, concurrent Avastin 6m71m IV q2 weeks   04/30/2020 -  Chemotherapy   Completes 4th cycle of CCNU/Avastin, transitions to Avastin monotherapy due to cytopenias   08/27/2020 -  Chemotherapy   Resumes CCNU+Avastin after periventricular DWI signal change noted on MRI      Biomarkers:  MGMT Unknown.  IDH 1/2 Wild type.  EGFR Unknown  TERT Unknown   Interval History:  Jillian TAPLINsents today for evaluation following recent MRI brain. She did experience a provoked fall this past week, after tripping over one of her cats.  Otherwise no new or progressive neurologic complaints.  Stable right sided vision, no clear change from prior.  No right sided weakness. No seizures or headaches.  H+P (01/09/19) Patient presented to medical attention in early December with ~2 months history of right sided visual impairment.  She describes fuzzy or blurry vision on that side which was progressive over time, leading to at least one fall.  MRI brain demonstrated enhancing left occipital mass; this was subsequently resected by Dr.  NundKathyrn Sheriff12/18/20.  She had no issues with surgery and has completed her steroid taper.  Continues on keppra daily but no history of any seizure. She has returned to work with only modest difficutly.  No issues walking or performing ADLs; lives alone but her sister and other family members are nearby.  Medications: Current Outpatient Medications on File Prior to Visit  Medication Sig Dispense Refill   Biotin 1 MG CAPS Take 1 mg by mouth daily.     CALCIUM PO Take 1 tablet by mouth daily.     EUTHYROX 50 MCG tablet Take 50 mcg by mouth daily.     naproxen sodium (ALEVE) 220 MG tablet Take 1 tablet (220 mg total) by mouth daily as needed (pain).     omeprazole (PRILOSEC) 20 MG capsule Take 1 capsule (20 mg total) by mouth daily. 30 capsule 3   ondansetron (ZOFRAN) 8 MG tablet Take 1 tablet (8 mg total) by mouth 2 (two) times daily as needed. Start on the third day after chemotherapy. 30 tablet 1   doxycycline (VIBRA-TABS) 100 MG tablet Take 1 tablet by mouth daily. (Patient not taking: No sig reported)     No current facility-administered medications on file prior to visit.    Allergies:  Allergies  Allergen Reactions   Etodolac Other (See Comments)    GI upset   Macrobid [Nitrofurantoin] Rash   Past Medical History:  Past Medical History:  Diagnosis Date   High cholesterol    Hypothyroidism    Joint pain    Thrombocytopenia (HCC)[place]21/2022   Past Surgical  History:  Past Surgical History:  Procedure Laterality Date   APPLICATION OF CRANIAL NAVIGATION Left 12/23/2018   Procedure: APPLICATION OF CRANIAL NAVIGATION;  Surgeon: Consuella Lose, MD;  Location: [facility];  Service: Neurosurgery;  Laterality: Left;  APPLICATION OF CRANIAL NAVIGATION   BREAST BIOPSY Left    COLONOSCOPY     CRANIOTOMY Left 12/23/2018   Procedure: STEREOTACTIC LEFT OCCIPITAL CRANIOTOMY FOR RESECTION OF TUMOR;  Surgeon: Consuella Lose, MD;  Location: [facility];  Service: Neurosurgery;  Laterality: Left;   STEREOTACTIC LEFT OCCIPITAL CRANIOTOMY FOR RESECTION OF TUMOR   LASIK     Social History:  Social History   Socioeconomic History   Marital status: Single    Spouse name: Not on file   Number of children: Not on file   Years of education: Not on file   Highest education level: Not on file  Occupational History   Not on file  Tobacco Use   Smoking status: Never   Smokeless tobacco: Never  Vaping Use   Vaping Use: Never used  Substance and Sexual Activity   Alcohol use: Not Currently   Drug use: Never   Sexual activity: Not on file  Other Topics Concern   Not on file  Social History Narrative   Not on file   Social Determinants of Health   Financial Resource Strain: Not on file  Food Insecurity: Not on file  Transportation Needs: Not on file  Physical Activity: Not on file  Stress: Not on file  Social Connections: Not on file  Intimate Partner Violence: Not on file   Family History: No family history on file.  Review of Systems: Constitutional: Denies fevers, chills or abnormal weight loss Eyes: Denies blurriness of vision Gastrointestinal:  Denies nausea, constipation, diarrhea GU: Denies dysuria or incontinence Skin: Denies abnormal skin rashes Musculoskeletal: Denies joint pain, back or neck discomfort.  Behavioral/Psych: Denies anxiety, mood instability  Physical Exam: Vitals:   08/27/20 0934  BP: (!) 157/77  Pulse: 68  Resp: 18  Temp: 97.7 F (36.5 C)  SpO2: 100%   KPS: 80. General: Alert, cooperative, pleasant, in no acute distress Head: Craniotomy scar noted, dry and intact. EENT: No conjunctival injection or scleral icterus. Oral mucosa moist Lungs: Resp effort normal Cardiac: Regular rate and rhythm Abdomen: Soft, non-distended abdomen Skin: No rashes cyanosis or petechiae. Extremities: No clubbing or edema  Neurologic Exam: Mental Status: Awake, alert, attentive to examiner. Oriented to self and environment. Language is notable for mild  transcortical expressive dyshpasia. Cranial Nerves: Visual acuity is grossly normal. Right homonymous hemianopia. Extra-ocular movements intact. No ptosis. Face is symmetric, tongue midline. Motor: Tone and bulk are normal. Power is full in both arms and legs. Reflexes are symmetric, no pathologic reflexes present. Intact finger to nose bilaterally Sensory: Intact to light touch and temperature Gait: Normal and tandem gait is deferred.   Labs: I have reviewed the data as listed    Component Value Date/Time   NA 142 08/27/2020 0910   K 4.0 08/27/2020 0910   CL 107 08/27/2020 0910   CO2 25 08/27/2020 0910   GLUCOSE 75 08/27/2020 0910   BUN 25 (H) 08/27/2020 0910   CREATININE 1.20 (H) 08/27/2020 0910   CALCIUM 9.6 08/27/2020 0910   PROT 6.4 (L) 08/27/2020 0910   ALBUMIN 3.4 (L) 08/27/2020 0910   AST 28 08/27/2020 0910   ALT 25 08/27/2020 0910   ALKPHOS 143 (H) 08/27/2020 0910   BILITOT 0.5 08/27/2020 0910   GFRNONAA 51 (L)  08/27/2020 0910   GFRAA >60 10/03/2019 1133   Lab Results  Component Value Date   WBC 5.1 08/27/2020   NEUTROABS 3.2 08/27/2020   HGB 13.1 08/27/2020   HCT 38.2 08/27/2020   MCV 93.4 08/27/2020   PLT 129 (L) 08/27/2020   Imaging:  [facility] Clinician Interpretation: I have personally reviewed the CNS images as listed.  My interpretation, in the context of the patient's clinical presentation, is progressive disease  MR BRAIN W WO CONTRAST  Result Date: 08/25/2020 CLINICAL DATA:  Follow-up glioblastoma EXAM: MRI HEAD WITHOUT AND WITH CONTRAST TECHNIQUE: Multiplanar, multiecho pulse sequences of the brain and surrounding structures were obtained without and with intravenous contrast. CONTRAST:  2m GADAVIST GADOBUTROL 1 MMOL/ML IV SOLN COMPARISON:  06/21/2020 FINDINGS: Brain: Left occipital parietal mass with resection cavity and peripheral mineralization. Limited enhancement in the setting of Avastin. There is a nodule along the atrium of the left lateral ventricle  which shows no clear enhancement but does restrict diffusion and is T2 hypointense, pattern likely related to tumor with Avastin changes. The nodule measures 11 mm today and was only a thin crescent previously. T2 signal in the treatment region and elsewhere is unchanged. No acute hemorrhage, hydrocephalus, or collection. Vascular: Normal flow voids Skull and upper cervical spine: Normal marrow signal Sinuses/Orbits: Negative IMPRESSION: Small but intervally enlarged left periatrial nodule most apparent on diffusion imaging in the setting of Avastin use. No progressive finding at the resection cavity. Electronically Signed   By: JMonte FantasiaM.D.   On: 08/25/2020 05:42    Assessment/Plan Glioblastoma, IDH-1 WT  Jillian PRIOLEAUis clinically stable today.  MRI demonstrates increase in volume of periventricular DWI signal abnormality, often seen as an early sign of tumor progression in avastin patients.    We discussed returning to prior regimen of CCNU+Avastin with neulasta support, which she was agreeable with.    Labs continue to demonstrate modest proteinuria, still within appropriate range for avastin.    We will continue with single agent Avastin today, and resume cycle #5 of oral CCNU at 947mm2 dose level.  Pegfilgrastim should be administered at least 24 hours s/p CCNU dosing.  Chemotherapy should be held for the following:   ANC less than 1000  Platelets less than 100,000  LFT or creatinine greater than 2x ULN  If clinical concerns/contraindications develop  Avastin should be held for the following:   ANC less than 500  Platelets less than 50,000  LFT or creatinine greater than 2x ULN  If clinical concerns/contraindications develop  We ask that Jillian Coombeseturn to clinic in 2 weeks with labs for review prior to next infusion.    All questions were answered. The patient knows to call the clinic with any problems, questions or concerns. No barriers to learning were  detected.  I have spent a total of 40 minutes of face-to-face and non-face-to-face time, excluding clinical staff time, preparing to see patient, ordering tests and/or medications, counseling the patient, and independently interpreting results and communicating results to the patient/family/caregiver    ZaVentura SellersMD Medical Director of Neuro-Oncology [facility] [place]8/23/22 2:43 PM

## 2020-08-27 NOTE — Telephone Encounter (Signed)
Oral Oncology Pharmacist Encounter  Received prescription for Gleostine (lomustine) for the treatment of glioblastoma multiforme in conjunction with bevacizumab, planned duration until disease progression or unacceptable drug toxicity.  Prescription dose and frequency assessed for appropriateness. CBC w/ Diff and CMP from 08/27/20 assessed, noted pt with renal dysfunction with Scr 1.20 mg/dL (CrCl ~ 34 mL/min). Lomustine dose has been reduced 25% to 100 mg by mouth once for 1 dose to account for renal impairment.  Current medication list in Epic reviewed, no relevant/significant DDIs with Lomustine identified.  Evaluated chart and no patient barriers to medication adherence noted.   Patient agreement for treatment documented in MD note on 08/27/20.  Prescription has been e-scribed to the [facility] for benefits analysis and approval.  Oral Oncology Clinic will continue to follow for insurance authorization, copayment issues, initial counseling and start date.  Leron Croak, PharmD, BCPS Hematology/Oncology Clinical Pharmacist [facility] [phone] 08/27/2020 3:42 PM

## 2020-08-27 NOTE — Progress Notes (Signed)
08/27/2020 per Dr Mickeal Skinner ok to treat today with current labs.

## 2020-08-27 NOTE — Patient Instructions (Signed)
[facility] ONCOLOGY  Discharge Instructions: Thank you for choosing [facility] to provide your oncology and hematology care.   If you have a lab appointment with the [facility], please go directly to the [facility] and check in at the registration area.   Wear comfortable clothing and clothing appropriate for easy access to any Portacath or PICC line.   We strive to give you quality time with your provider. You may need to reschedule your appointment if you arrive late (15 or more minutes).  Arriving late affects you and other patients whose appointments are after yours.  Also, if you miss three or more appointments without notifying the office, you may be dismissed from the clinic at the provider's discretion.      For prescription refill requests, have your pharmacy contact our office and allow 72 hours for refills to be completed.    Today you received the following chemotherapy and/or immunotherapy agents Bevacizumab-awwb      To help prevent nausea and vomiting after your treatment, we encourage you to take your nausea medication as directed.  BELOW ARE SYMPTOMS THAT SHOULD BE REPORTED IMMEDIATELY: *FEVER GREATER THAN 100.4 F (38 C) OR HIGHER *CHILLS OR SWEATING *NAUSEA AND VOMITING THAT IS NOT CONTROLLED WITH YOUR NAUSEA MEDICATION *UNUSUAL SHORTNESS OF BREATH *UNUSUAL BRUISING OR BLEEDING *URINARY PROBLEMS (pain or burning when urinating, or frequent urination) *BOWEL PROBLEMS (unusual diarrhea, constipation, pain near the anus) TENDERNESS IN MOUTH AND THROAT WITH OR WITHOUT PRESENCE OF ULCERS (sore throat, sores in mouth, or a toothache) UNUSUAL RASH, SWELLING OR PAIN  UNUSUAL VAGINAL DISCHARGE OR ITCHING   Items with * indicate a potential emergency and should be followed up as soon as possible or go to the Emergency Department if any problems should occur.  Please show the CHEMOTHERAPY ALERT CARD or IMMUNOTHERAPY ALERT CARD at  check-in to the Emergency Department and triage nurse.  Should you have questions after your visit or need to cancel or reschedule your appointment, please contact [facility]  Dept: [phone]  and follow the prompts.  Office hours are 8:00 a.m. to 4:30 p.m. Monday - Friday. Please note that voicemails left after 4:00 p.m. may not be returned until the following business day.  We are closed weekends and major holidays. You have access to a nurse at all times for urgent questions. Please call the main number to the clinic Dept: [phone] and follow the prompts.   For any non-urgent questions, you may also contact your provider using MyChart. We now offer e-Visits for anyone 3 and older to request care online for non-urgent symptoms. For details visit [email or web address].   Also download the MyChart app! Go to the app store, search "MyChart", open the app, select [facility], and log in with your MyChart username and password.  Due to Covid, a mask is required upon entering the hospital/clinic. If you do not have a mask, one will be given to you upon arrival. For doctor visits, patients may have 1 support person aged 9 or older with them. For treatment visits, patients cannot have anyone with them due to current Covid guidelines and our immunocompromised population.

## 2020-08-28 ENCOUNTER — Other Ambulatory Visit (HOSPITAL_COMMUNITY): Payer: Self-pay

## 2020-08-28 NOTE — Telephone Encounter (Signed)
Oral Chemotherapy Pharmacist Encounter  I spoke with patient for review of: lomustine for the treatment of glioblastoma multiforme in conjunction with bevacizumab, planned duration until disease progression or unacceptable toxicity.  Counseled patient on administration, dosing, side effects, monitoring, drug-food interactions, safe handling, storage, and disposal.  Patient will take lomustine '100mg'$  capsules, 1 capsule ('100mg'$ ) by mouth once daily, may take at bedtime and on an empty stomach to decrease nausea and vomiting.  Lomustine will be administered once every 6 weeks.  Lomustine start date: 09/01/20 Patient will receive GCSF support > 24 hours after dose of lomustine. Tentatively planned for 09/03/20   Patient will take Zofran '8mg'$  tablet, 1 tablet by mouth 30-60 min prior to lomustine dose to help decrease N/V.   Adverse effects include but are not limited to: nausea, vomiting, stomatitis, alopecia, decreased blood counts, alopecia, hepatotoxicity, and pulmonary toxicity.  Reviewed with patient importance of keeping a medication schedule and plan for any missed doses. No barriers to medication adherence identified.  Medication reconciliation performed and medication/allergy list updated.  Insurance authorization for lomustine has been obtained. Test claim at the pharmacy revealed copayment $100 for 1st fill of Lomustine. This will ship from the [facility] on 08/28/20 Ms. Grigoryan to deliver to patient's home on 08/29/20.  Patient informed the pharmacy will reach out 5-7 days prior to needing next fill of lomustine to coordinate continued medication acquisition to prevent break in therapy.  All questions answered.  Ms. Manfull voiced understanding and appreciation.   Medication education handout placed in mail for patient. Patient knows to call the office with questions or concerns. Oral Chemotherapy Clinic phone number provided to patient.   Leron Croak,  PharmD, BCPS Hematology/Oncology Clinical Pharmacist [facility] [phone] 08/28/2020 9:44 AM

## 2020-08-30 ENCOUNTER — Telehealth: Payer: Self-pay | Admitting: *Deleted

## 2020-08-30 NOTE — Telephone Encounter (Signed)
Received call from pt requesting to have her GCSF injection done in [place] on 09/03/20. Pt is taking her dose of CCNU on Sunday, 09/01/20 and will need GCSF on Tuesday. Advised that we cannot do any type of treatments at [facility] at this time. Pt voiced understanding. Scheduling message sent for injection appt @ 9am here on 09/03/20

## 2020-08-31 DIAGNOSIS — E785 Hyperlipidemia, unspecified: Secondary | ICD-10-CM | POA: Diagnosis not present

## 2020-08-31 DIAGNOSIS — L089 Local infection of the skin and subcutaneous tissue, unspecified: Secondary | ICD-10-CM | POA: Diagnosis not present

## 2020-08-31 DIAGNOSIS — E039 Hypothyroidism, unspecified: Secondary | ICD-10-CM | POA: Diagnosis not present

## 2020-08-31 DIAGNOSIS — B958 Unspecified staphylococcus as the cause of diseases classified elsewhere: Secondary | ICD-10-CM | POA: Diagnosis not present

## 2020-09-02 ENCOUNTER — Telehealth: Payer: Self-pay | Admitting: Internal Medicine

## 2020-09-02 NOTE — Telephone Encounter (Signed)
Scheduled appointment per 08/27 sch msg. Patient is aware.

## 2020-09-03 ENCOUNTER — Other Ambulatory Visit: Payer: Self-pay

## 2020-09-03 ENCOUNTER — Inpatient Hospital Stay: Payer: BC Managed Care – PPO

## 2020-09-03 VITALS — BP 158/80 | HR 75 | Temp 98.0°F | Resp 16

## 2020-09-03 DIAGNOSIS — Z5112 Encounter for antineoplastic immunotherapy: Secondary | ICD-10-CM | POA: Diagnosis not present

## 2020-09-03 DIAGNOSIS — D696 Thrombocytopenia, unspecified: Secondary | ICD-10-CM

## 2020-09-03 DIAGNOSIS — Z79899 Other long term (current) drug therapy: Secondary | ICD-10-CM | POA: Diagnosis not present

## 2020-09-03 DIAGNOSIS — C714 Malignant neoplasm of occipital lobe: Secondary | ICD-10-CM | POA: Diagnosis not present

## 2020-09-03 DIAGNOSIS — Z5189 Encounter for other specified aftercare: Secondary | ICD-10-CM | POA: Diagnosis not present

## 2020-09-03 DIAGNOSIS — E039 Hypothyroidism, unspecified: Secondary | ICD-10-CM | POA: Diagnosis not present

## 2020-09-03 DIAGNOSIS — D701 Agranulocytosis secondary to cancer chemotherapy: Secondary | ICD-10-CM

## 2020-09-03 DIAGNOSIS — E7800 Pure hypercholesterolemia, unspecified: Secondary | ICD-10-CM | POA: Diagnosis not present

## 2020-09-03 MED ORDER — PEGFILGRASTIM-CBQV 6 MG/0.6ML ~~LOC~~ SOSY
6.0000 mg | PREFILLED_SYRINGE | Freq: Once | SUBCUTANEOUS | Status: AC
  Administered 2020-09-03: 6 mg via SUBCUTANEOUS
  Filled 2020-09-03: qty 0.6

## 2020-09-10 ENCOUNTER — Other Ambulatory Visit: Payer: Self-pay

## 2020-09-10 ENCOUNTER — Inpatient Hospital Stay: Payer: BC Managed Care – PPO

## 2020-09-10 ENCOUNTER — Inpatient Hospital Stay: Payer: BC Managed Care – PPO | Attending: Internal Medicine

## 2020-09-10 ENCOUNTER — Inpatient Hospital Stay: Payer: BC Managed Care – PPO | Admitting: Internal Medicine

## 2020-09-10 VITALS — BP 139/76 | HR 72

## 2020-09-10 VITALS — BP 161/78 | HR 67 | Temp 98.4°F | Resp 16 | Ht 62.0 in | Wt 100.5 lb

## 2020-09-10 DIAGNOSIS — Z79899 Other long term (current) drug therapy: Secondary | ICD-10-CM | POA: Diagnosis not present

## 2020-09-10 DIAGNOSIS — Z5112 Encounter for antineoplastic immunotherapy: Secondary | ICD-10-CM | POA: Insufficient documentation

## 2020-09-10 DIAGNOSIS — C719 Malignant neoplasm of brain, unspecified: Secondary | ICD-10-CM

## 2020-09-10 DIAGNOSIS — E7800 Pure hypercholesterolemia, unspecified: Secondary | ICD-10-CM | POA: Diagnosis not present

## 2020-09-10 DIAGNOSIS — E039 Hypothyroidism, unspecified: Secondary | ICD-10-CM | POA: Insufficient documentation

## 2020-09-10 DIAGNOSIS — D696 Thrombocytopenia, unspecified: Secondary | ICD-10-CM | POA: Insufficient documentation

## 2020-09-10 DIAGNOSIS — C714 Malignant neoplasm of occipital lobe: Secondary | ICD-10-CM | POA: Insufficient documentation

## 2020-09-10 LAB — CBC WITH DIFFERENTIAL (CANCER CENTER ONLY)
Abs Immature Granulocytes: 0.31 10*3/uL — ABNORMAL HIGH (ref 0.00–0.07)
Basophils Absolute: 0 10*3/uL (ref 0.0–0.1)
Basophils Relative: 0 %
Eosinophils Absolute: 0.1 10*3/uL (ref 0.0–0.5)
Eosinophils Relative: 1 %
HCT: 40 % (ref 36.0–46.0)
Hemoglobin: 13.3 g/dL (ref 12.0–15.0)
Immature Granulocytes: 1 %
Lymphocytes Relative: 7 %
Lymphs Abs: 1.9 10*3/uL (ref 0.7–4.0)
MCH: 32 pg (ref 26.0–34.0)
MCHC: 33.3 g/dL (ref 30.0–36.0)
MCV: 96.2 fL (ref 80.0–100.0)
Monocytes Absolute: 1.3 10*3/uL — ABNORMAL HIGH (ref 0.1–1.0)
Monocytes Relative: 5 %
Neutro Abs: 22.8 10*3/uL — ABNORMAL HIGH (ref 1.7–7.7)
Neutrophils Relative %: 86 %
Platelet Count: 91 10*3/uL — ABNORMAL LOW (ref 150–400)
RBC: 4.16 MIL/uL (ref 3.87–5.11)
RDW: 13.5 % (ref 11.5–15.5)
WBC Count: 26.5 10*3/uL — ABNORMAL HIGH (ref 4.0–10.5)
nRBC: 0 % (ref 0.0–0.2)

## 2020-09-10 LAB — CMP (CANCER CENTER ONLY)
ALT: 43 U/L (ref 0–44)
AST: 53 U/L — ABNORMAL HIGH (ref 15–41)
Albumin: 3.7 g/dL (ref 3.5–5.0)
Alkaline Phosphatase: 299 U/L — ABNORMAL HIGH (ref 38–126)
Anion gap: 12 (ref 5–15)
BUN: 17 mg/dL (ref 8–23)
CO2: 23 mmol/L (ref 22–32)
Calcium: 9.2 mg/dL (ref 8.9–10.3)
Chloride: 107 mmol/L (ref 98–111)
Creatinine: 1.11 mg/dL — ABNORMAL HIGH (ref 0.44–1.00)
GFR, Estimated: 56 mL/min — ABNORMAL LOW (ref >60)
Glucose, Bld: 64 mg/dL — ABNORMAL LOW (ref 70–99)
Potassium: 4.2 mmol/L (ref 3.5–5.1)
Sodium: 142 mmol/L (ref 135–145)
Total Bilirubin: 0.4 mg/dL (ref 0.3–1.2)
Total Protein: 6.8 g/dL (ref 6.5–8.1)

## 2020-09-10 LAB — TOTAL PROTEIN, URINE DIPSTICK: Protein, ur: 300 mg/dL — AB

## 2020-09-10 MED ORDER — SODIUM CHLORIDE 0.9 % IV SOLN: Freq: Once | INTRAVENOUS | Status: AC

## 2020-09-10 MED ORDER — SODIUM CHLORIDE 0.9 % IV SOLN
10.0000 mg/kg | Freq: Once | INTRAVENOUS | Status: AC
  Administered 2020-09-10: 450 mg via INTRAVENOUS
  Filled 2020-09-10: qty 16

## 2020-09-10 NOTE — Progress Notes (Signed)
[facility] at [address], [place] [address] [phone]   Interval Evaluation  Date of Service: 09/10/20 Patient Name: Jillian Gomez Patient MRN: [id number] Patient DOB: Dec 05, 1955 Provider: Ventura Sellers, MD  Identifying Statement:  Jillian Gomez is a 65 y.o. female with left occipital glioblastoma   Oncologic History: Oncology History  Glioblastoma with isocitrate dehydrogenase gene wildtype ([place])  12/23/2018 Surgery   Craniotomy, left occipital resection by Dr. Kathyrn Sheriff.  Path is GBM IDH-wt   01/23/2019 - 03/03/2019 Radiation Therapy   IMRT with concurrent Temozolomide 32m/m2   03/28/2019 - 09/04/2019 Chemotherapy   5 cycles of adjuvant 5-day Temozolomide 2041mm2    09/01/2019 Progression   Progression of disease #1   09/19/2019 -  Chemotherapy   Begins second line therapy with oral CCNU 9038m2 q6 weeks, concurrent Avastin 73m85m IV q2 weeks   04/30/2020 -  Chemotherapy   Completes 4th cycle of CCNU/Avastin, transitions to Avastin monotherapy due to cytopenias   08/27/2020 -  Chemotherapy   Resumes CCNU+Avastin after periventricular DWI signal change noted on MRI      Biomarkers:  MGMT Unknown.  IDH 1/2 Wild type.  EGFR Unknown  TERT Unknown   Interval History:  Jillian DEPAULAsents today for avastin infusion. Experienced modest fatigue after dosing additional cycle of CCNU and neulasta.  Otherwise no new or progressive neurologic complaints.  Stable right sided vision, no clear change from prior.  No right sided weakness. No seizures or headaches.  H+P (01/09/19) Patient presented to medical attention in early December with ~2 months history of right sided visual impairment.  She describes fuzzy or blurry vision on that side which was progressive over time, leading to at least one fall.  MRI brain demonstrated enhancing left occipital mass; this was subsequently resected by Dr. NundKathyrn Sheriff12/18/20.  She had  no issues with surgery and has completed her steroid taper.  Continues on keppra daily but no history of any seizure. She has returned to work with only modest difficutly.  No issues walking or performing ADLs; lives alone but her sister and other family members are nearby.  Medications: Current Outpatient Medications on File Prior to Visit  Medication Sig Dispense Refill   Biotin 1 MG CAPS Take 1 mg by mouth daily.     CALCIUM PO Take 1 tablet by mouth daily.     doxycycline (VIBRA-TABS) 100 MG tablet Take 1 tablet by mouth daily. (Patient not taking: No sig reported)     EUTHYROX 50 MCG tablet Take 50 mcg by mouth daily.     levothyroxine (SYNTHROID) 75 MCG tablet Take 75 mcg by mouth daily.     naproxen sodium (ALEVE) 220 MG tablet Take 1 tablet (220 mg total) by mouth daily as needed (pain).     omeprazole (PRILOSEC) 20 MG capsule Take 1 capsule (20 mg total) by mouth daily. 30 capsule 3   ondansetron (ZOFRAN) 8 MG tablet Take 1 tablet (8 mg total) by mouth 2 (two) times daily as needed. Start on the third day after chemotherapy. 30 tablet 1   rosuvastatin (CRESTOR) 10 MG tablet Take 10 mg by mouth at bedtime.     No current facility-administered medications on file prior to visit.    Allergies:  Allergies  Allergen Reactions   Etodolac Other (See Comments)    GI upset   Macrobid [Nitrofurantoin] Rash   Past Medical History:  Past Medical History:  Diagnosis Date  High cholesterol    Hypothyroidism    Joint pain    Thrombocytopenia ([place]) 02/26/2020   Past Surgical History:  Past Surgical History:  Procedure Laterality Date   APPLICATION OF CRANIAL NAVIGATION Left 12/23/2018   Procedure: APPLICATION OF CRANIAL NAVIGATION;  Surgeon: Consuella Lose, MD;  Location: [facility];  Service: Neurosurgery;  Laterality: Left;  APPLICATION OF CRANIAL NAVIGATION   BREAST BIOPSY Left    COLONOSCOPY     CRANIOTOMY Left 12/23/2018   Procedure: STEREOTACTIC LEFT OCCIPITAL CRANIOTOMY FOR  RESECTION OF TUMOR;  Surgeon: Consuella Lose, MD;  Location: [facility];  Service: Neurosurgery;  Laterality: Left;  STEREOTACTIC LEFT OCCIPITAL CRANIOTOMY FOR RESECTION OF TUMOR   LASIK     Social History:  Social History   Socioeconomic History   Marital status: Single    Spouse name: Not on file   Number of children: Not on file   Years of education: Not on file   Highest education level: Not on file  Occupational History   Not on file  Tobacco Use   Smoking status: Never   Smokeless tobacco: Never  Vaping Use   Vaping Use: Never used  Substance and Sexual Activity   Alcohol use: Not Currently   Drug use: Never   Sexual activity: Not on file  Other Topics Concern   Not on file  Social History Narrative   Not on file   Social Determinants of Health   Financial Resource Strain: Not on file  Food Insecurity: Not on file  Transportation Needs: Not on file  Physical Activity: Not on file  Stress: Not on file  Social Connections: Not on file  Intimate Partner Violence: Not on file   Family History: No family history on file.  Review of Systems: Constitutional: Denies fevers, chills or abnormal weight loss Eyes: Denies blurriness of vision Gastrointestinal:  Denies nausea, constipation, diarrhea GU: Denies dysuria or incontinence Skin: Denies abnormal skin rashes Musculoskeletal: Denies joint pain, back or neck discomfort.  Behavioral/Psych: Denies anxiety, mood instability  Physical Exam: Vitals:   09/10/20 1246  BP: (!) 161/78  Pulse: 67  Resp: 16  Temp: 98.4 F (36.9 C)  SpO2: 100%   KPS: 80. General: Alert, cooperative, pleasant, in no acute distress Head: Craniotomy scar noted, dry and intact. EENT: No conjunctival injection or scleral icterus. Oral mucosa moist Lungs: Resp effort normal Cardiac: Regular rate and rhythm Abdomen: Soft, non-distended abdomen Skin: No rashes cyanosis or petechiae. Extremities: No clubbing or edema  Neurologic  Exam: Mental Status: Awake, alert, attentive to examiner. Oriented to self and environment. Language is notable for mild transcortical expressive dyshpasia. Cranial Nerves: Visual acuity is grossly normal. Right homonymous hemianopia. Extra-ocular movements intact. No ptosis. Face is symmetric, tongue midline. Motor: Tone and bulk are normal. Power is full in both arms and legs. Reflexes are symmetric, no pathologic reflexes present. Intact finger to nose bilaterally Sensory: Intact to light touch and temperature Gait: Normal and tandem gait is deferred.   Labs: I have reviewed the data as listed    Component Value Date/Time   NA 142 08/27/2020 0910   K 4.0 08/27/2020 0910   CL 107 08/27/2020 0910   CO2 25 08/27/2020 0910   GLUCOSE 75 08/27/2020 0910   BUN 25 (H) 08/27/2020 0910   CREATININE 1.20 (H) 08/27/2020 0910   CALCIUM 9.6 08/27/2020 0910   PROT 6.4 (L) 08/27/2020 0910   ALBUMIN 3.4 (L) 08/27/2020 0910   AST 28 08/27/2020 0910   ALT  25 08/27/2020 0910   ALKPHOS 143 (H) 08/27/2020 0910   BILITOT 0.5 08/27/2020 0910   GFRNONAA 51 (L) 08/27/2020 0910   GFRAA >60 10/03/2019 1133   Lab Results  Component Value Date   WBC 26.5 (H) 09/10/2020   NEUTROABS PENDING 09/10/2020   HGB 13.3 09/10/2020   HCT 40.0 09/10/2020   MCV 96.2 09/10/2020   PLT 91 (L) 09/10/2020     Assessment/Plan Glioblastoma, IDH-1 WT  SOMAYA GRASSI is clinically stable today.  Labs demonstrate modest thrombocytopenia, proteinuria.  CCNU is Day 10/42 of cycle #5.  We will continue with single agent Avastin today, and con't cycle #5 of oral CCNU at 37m/m2 dose level.  Pegfilgrastim should be administered at least 24 hours s/p CCNU dosing.  Chemotherapy should be held for the following:   ANC less than 1000  Platelets less than 100,000  LFT or creatinine greater than 2x ULN  If clinical concerns/contraindications develop  Avastin should be held for the following:   ANC less than 500   Platelets less than 50,000  LFT or creatinine greater than 2x ULN  If clinical concerns/contraindications develop  We ask that RNadyne Coombesreturn to clinic in 2 weeks with labs for review prior to next infusion.    All questions were answered. The patient knows to call the clinic with any problems, questions or concerns. No barriers to learning were detected.  I have spent a total of 30 minutes of face-to-face and non-face-to-face time, excluding clinical staff time, preparing to see patient, ordering tests and/or medications, counseling the patient, and independently interpreting results and communicating results to the patient/family/caregiver    ZVentura Sellers MD Medical Director of Neuro-Oncology [facility]09/06/22 1:05 PM

## 2020-09-10 NOTE — Progress Notes (Signed)
Per Dr. Renda Rolls tx note for today/tx parameters in treatment plan, okay to proceed with tx as scheduled today.

## 2020-09-10 NOTE — Patient Instructions (Signed)
[facility]  Discharge Instructions: Thank you for choosing [facility] to provide your oncology and hematology care.   If you have a lab appointment with the Cancer Center, please go directly to the Cancer Center and check in at the registration area.   Wear comfortable clothing and clothing appropriate for easy access to any Portacath or PICC line.   We strive to give you quality time with your provider. You may need to reschedule your appointment if you arrive late (15 or more minutes).  Arriving late affects you and other patients whose appointments are after yours.  Also, if you miss three or more appointments without notifying the office, you may be dismissed from the clinic at the provider's discretion.      For prescription refill requests, have your pharmacy contact our office and allow 72 hours for refills to be completed.    Today you received the following chemotherapy and/or immunotherapy agents Avastin       To help prevent nausea and vomiting after your treatment, we encourage you to take your nausea medication as directed.  BELOW ARE SYMPTOMS THAT SHOULD BE REPORTED IMMEDIATELY: *FEVER GREATER THAN 100.4 F (38 C) OR HIGHER *CHILLS OR SWEATING *NAUSEA AND VOMITING THAT IS NOT CONTROLLED WITH YOUR NAUSEA MEDICATION *UNUSUAL SHORTNESS OF BREATH *UNUSUAL BRUISING OR BLEEDING *URINARY PROBLEMS (pain or burning when urinating, or frequent urination) *BOWEL PROBLEMS (unusual diarrhea, constipation, pain near the anus) TENDERNESS IN MOUTH AND THROAT WITH OR WITHOUT PRESENCE OF ULCERS (sore throat, sores in mouth, or a toothache) UNUSUAL RASH, SWELLING OR PAIN  UNUSUAL VAGINAL DISCHARGE OR ITCHING   Items with * indicate a potential emergency and should be followed up as soon as possible or go to the Emergency Department if any problems should occur.  Please show the CHEMOTHERAPY ALERT CARD or IMMUNOTHERAPY ALERT CARD at check-in to  the Emergency Department and triage nurse.  Should you have questions after your visit or need to cancel or reschedule your appointment, please contact [facility]  Dept: [phone]  and follow the prompts.  Office hours are 8:00 a.m. to 4:30 p.m. Monday - Friday. Please note that voicemails left after 4:00 p.m. may not be returned until the following business day.  We are closed weekends and major holidays. You have access to a nurse at all times for urgent questions. Please call the main number to the clinic Dept: [phone] and follow the prompts.   For any non-urgent questions, you may also contact your provider using MyChart. We now offer e-Visits for anyone 18 and older to request care online for non-urgent symptoms. For details visit [email or web address].   Also download the MyChart app! Go to the app store, search "MyChart", open the app, select [facility], and log in with your MyChart username and password.  Due to Covid, a mask is required upon entering the hospital/clinic. If you do not have a mask, one will be given to you upon arrival. For doctor visits, patients may have 1 support person aged 18 or older with them. For treatment visits, patients cannot have anyone with them due to current Covid guidelines and our immunocompromised population.   

## 2020-09-24 ENCOUNTER — Inpatient Hospital Stay: Payer: BC Managed Care – PPO

## 2020-09-24 ENCOUNTER — Other Ambulatory Visit: Payer: Self-pay

## 2020-09-24 ENCOUNTER — Inpatient Hospital Stay: Payer: BC Managed Care – PPO | Admitting: Internal Medicine

## 2020-09-24 VITALS — BP 141/85 | HR 88 | Temp 98.1°F | Resp 16 | Ht 62.0 in | Wt 100.5 lb

## 2020-09-24 DIAGNOSIS — C719 Malignant neoplasm of brain, unspecified: Secondary | ICD-10-CM

## 2020-09-24 DIAGNOSIS — D696 Thrombocytopenia, unspecified: Secondary | ICD-10-CM | POA: Diagnosis not present

## 2020-09-24 DIAGNOSIS — E7800 Pure hypercholesterolemia, unspecified: Secondary | ICD-10-CM | POA: Diagnosis not present

## 2020-09-24 DIAGNOSIS — Z5112 Encounter for antineoplastic immunotherapy: Secondary | ICD-10-CM | POA: Diagnosis not present

## 2020-09-24 DIAGNOSIS — C714 Malignant neoplasm of occipital lobe: Secondary | ICD-10-CM | POA: Diagnosis not present

## 2020-09-24 DIAGNOSIS — E039 Hypothyroidism, unspecified: Secondary | ICD-10-CM | POA: Diagnosis not present

## 2020-09-24 DIAGNOSIS — Z79899 Other long term (current) drug therapy: Secondary | ICD-10-CM | POA: Diagnosis not present

## 2020-09-24 LAB — CBC WITH DIFFERENTIAL (CANCER CENTER ONLY)
Abs Immature Granulocytes: 0.04 10*3/uL (ref 0.00–0.07)
Basophils Absolute: 0.1 10*3/uL (ref 0.0–0.1)
Basophils Relative: 1 %
Eosinophils Absolute: 0.1 10*3/uL (ref 0.0–0.5)
Eosinophils Relative: 1 %
HCT: 36.4 % (ref 36.0–46.0)
Hemoglobin: 12.5 g/dL (ref 12.0–15.0)
Immature Granulocytes: 0 %
Lymphocytes Relative: 13 %
Lymphs Abs: 1.5 10*3/uL (ref 0.7–4.0)
MCH: 32.6 pg (ref 26.0–34.0)
MCHC: 34.3 g/dL (ref 30.0–36.0)
MCV: 94.8 fL (ref 80.0–100.0)
Monocytes Absolute: 0.9 10*3/uL (ref 0.1–1.0)
Monocytes Relative: 8 %
Neutro Abs: 8.9 10*3/uL — ABNORMAL HIGH (ref 1.7–7.7)
Neutrophils Relative %: 77 %
Platelet Count: 98 10*3/uL — ABNORMAL LOW (ref 150–400)
RBC: 3.84 MIL/uL — ABNORMAL LOW (ref 3.87–5.11)
RDW: 14.2 % (ref 11.5–15.5)
WBC Count: 11.5 10*3/uL — ABNORMAL HIGH (ref 4.0–10.5)
nRBC: 0 % (ref 0.0–0.2)

## 2020-09-24 LAB — CMP (CANCER CENTER ONLY)
ALT: 25 U/L (ref 0–44)
AST: 34 U/L (ref 15–41)
Albumin: 3.4 g/dL — ABNORMAL LOW (ref 3.5–5.0)
Alkaline Phosphatase: 175 U/L — ABNORMAL HIGH (ref 38–126)
Anion gap: 9 (ref 5–15)
BUN: 35 mg/dL — ABNORMAL HIGH (ref 8–23)
CO2: 24 mmol/L (ref 22–32)
Calcium: 9.6 mg/dL (ref 8.9–10.3)
Chloride: 106 mmol/L (ref 98–111)
Creatinine: 1.17 mg/dL — ABNORMAL HIGH (ref 0.44–1.00)
GFR, Estimated: 52 mL/min — ABNORMAL LOW (ref >60)
Glucose, Bld: 86 mg/dL (ref 70–99)
Potassium: 4.2 mmol/L (ref 3.5–5.1)
Sodium: 139 mmol/L (ref 135–145)
Total Bilirubin: 0.5 mg/dL (ref 0.3–1.2)
Total Protein: 6.2 g/dL — ABNORMAL LOW (ref 6.5–8.1)

## 2020-09-24 LAB — TOTAL PROTEIN, URINE DIPSTICK: Protein, ur: 300 mg/dL — AB

## 2020-09-24 MED ORDER — SODIUM CHLORIDE 0.9 % IV SOLN: Freq: Once | INTRAVENOUS | Status: AC

## 2020-09-24 MED ORDER — SODIUM CHLORIDE 0.9 % IV SOLN
10.0000 mg/kg | Freq: Once | INTRAVENOUS | Status: AC
  Administered 2020-09-24: 450 mg via INTRAVENOUS
  Filled 2020-09-24: qty 16

## 2020-09-24 NOTE — Progress Notes (Signed)
[facility] at [address], [place] [address] [phone]   Interval Evaluation  Date of Service: 09/24/20 Patient Name: Jillian Gomez Patient MRN: [id number] Patient DOB: 1955-01-19 Provider: Ventura Sellers, MD  Identifying Statement:  Jillian Gomez is a 65 y.o. female with left occipital glioblastoma   Oncologic History: Oncology History  Glioblastoma with isocitrate dehydrogenase gene wildtype ([place])  12/23/2018 Surgery   Craniotomy, left occipital resection by Dr. Kathyrn Sheriff.  Path is GBM IDH-wt   01/23/2019 - 03/03/2019 Radiation Therapy   IMRT with concurrent Temozolomide 65m/m2   03/28/2019 - 09/04/2019 Chemotherapy   5 cycles of adjuvant 5-day Temozolomide 2074mm2    09/01/2019 Progression   Progression of disease #1   09/19/2019 -  Chemotherapy   Begins second line therapy with oral CCNU 9055m2 q6 weeks, concurrent Avastin 62m3m IV q2 weeks   04/30/2020 -  Chemotherapy   Completes 4th cycle of CCNU/Avastin, transitions to Avastin monotherapy due to cytopenias   08/27/2020 -  Chemotherapy   Resumes CCNU+Avastin after periventricular DWI signal change noted on MRI      Biomarkers:  MGMT Unknown.  IDH 1/2 Wild type.  EGFR Unknown  TERT Unknown   Interval History:  Jillian AKARDsents today for avastin infusion. No worsening of fatigue described at prior visit.  Otherwise no new or progressive neurologic complaints.  Stable right sided vision, no clear change from prior.  No right sided weakness. No seizures or headaches.  H+P (01/09/19) Patient presented to medical attention in early December with ~2 months history of right sided visual impairment.  She describes fuzzy or blurry vision on that side which was progressive over time, leading to at least one fall.  MRI brain demonstrated enhancing left occipital mass; this was subsequently resected by Dr. NundKathyrn Sheriff12/18/20.  She had no issues with surgery and  has completed her steroid taper.  Continues on keppra daily but no history of any seizure. She has returned to work with only modest difficutly.  No issues walking or performing ADLs; lives alone but her sister and other family members are nearby.  Medications: Current Outpatient Medications on File Prior to Visit  Medication Sig Dispense Refill   Biotin 1 MG CAPS Take 1 mg by mouth daily.     CALCIUM PO Take 1 tablet by mouth daily.     doxycycline (VIBRA-TABS) 100 MG tablet Take 1 tablet by mouth daily.     EUTHYROX 50 MCG tablet Take 50 mcg by mouth daily.     levothyroxine (SYNTHROID) 75 MCG tablet Take 75 mcg by mouth daily.     naproxen sodium (ALEVE) 220 MG tablet Take 1 tablet (220 mg total) by mouth daily as needed (pain).     omeprazole (PRILOSEC) 20 MG capsule Take 1 capsule (20 mg total) by mouth daily. 30 capsule 3   ondansetron (ZOFRAN) 8 MG tablet Take 1 tablet (8 mg total) by mouth 2 (two) times daily as needed. Start on the third day after chemotherapy. 30 tablet 1   rosuvastatin (CRESTOR) 10 MG tablet Take 5 mg by mouth at bedtime.     No current facility-administered medications on file prior to visit.    Allergies:  Allergies  Allergen Reactions   Etodolac Other (See Comments)    GI upset   Macrobid [Nitrofurantoin] Rash   Past Medical History:  Past Medical History:  Diagnosis Date   High cholesterol    Hypothyroidism  Joint pain    Thrombocytopenia ([place]) 02/26/2020   Past Surgical History:  Past Surgical History:  Procedure Laterality Date   APPLICATION OF CRANIAL NAVIGATION Left 12/23/2018   Procedure: APPLICATION OF CRANIAL NAVIGATION;  Surgeon: Consuella Lose, MD;  Location: [facility];  Service: Neurosurgery;  Laterality: Left;  APPLICATION OF CRANIAL NAVIGATION   BREAST BIOPSY Left    COLONOSCOPY     CRANIOTOMY Left 12/23/2018   Procedure: STEREOTACTIC LEFT OCCIPITAL CRANIOTOMY FOR RESECTION OF TUMOR;  Surgeon: Consuella Lose, MD;  Location: [facility];  Service: Neurosurgery;  Laterality: Left;  STEREOTACTIC LEFT OCCIPITAL CRANIOTOMY FOR RESECTION OF TUMOR   LASIK     Social History:  Social History   Socioeconomic History   Marital status: Single    Spouse name: Not on file   Number of children: Not on file   Years of education: Not on file   Highest education level: Not on file  Occupational History   Not on file  Tobacco Use   Smoking status: Never   Smokeless tobacco: Never  Vaping Use   Vaping Use: Never used  Substance and Sexual Activity   Alcohol use: Not Currently   Drug use: Never   Sexual activity: Not on file  Other Topics Concern   Not on file  Social History Narrative   Not on file   Social Determinants of Health   Financial Resource Strain: Not on file  Food Insecurity: Not on file  Transportation Needs: Not on file  Physical Activity: Not on file  Stress: Not on file  Social Connections: Not on file  Intimate Partner Violence: Not on file   Family History: No family history on file.  Review of Systems: Constitutional: Denies fevers, chills or abnormal weight loss Eyes: Denies blurriness of vision Gastrointestinal:  Denies nausea, constipation, diarrhea GU: Denies dysuria or incontinence Skin: Denies abnormal skin rashes Musculoskeletal: Denies joint pain, back or neck discomfort.  Behavioral/Psych: Denies anxiety, mood instability  Physical Exam: Vitals:   09/24/20 1227  BP: (!) 141/85  Pulse: 88  Resp: 16  Temp: 98.1 F (36.7 C)  SpO2: 100%   KPS: 80. General: Alert, cooperative, pleasant, in no acute distress Head: Craniotomy scar noted, dry and intact. EENT: No conjunctival injection or scleral icterus. Oral mucosa moist Lungs: Resp effort normal Cardiac: Regular rate and rhythm Abdomen: Soft, non-distended abdomen Skin: No rashes cyanosis or petechiae. Extremities: No clubbing or edema  Neurologic Exam: Mental Status: Awake, alert, attentive to examiner. Oriented to self  and environment. Language is notable for mild transcortical expressive dyshpasia. Cranial Nerves: Visual acuity is grossly normal. Right homonymous hemianopia. Extra-ocular movements intact. No ptosis. Face is symmetric, tongue midline. Motor: Tone and bulk are normal. Power is full in both arms and legs. Reflexes are symmetric, no pathologic reflexes present. Intact finger to nose bilaterally Sensory: Intact to light touch and temperature Gait: Normal and tandem gait is deferred.   Labs: I have reviewed the data as listed    Component Value Date/Time   NA 142 09/10/2020 1228   K 4.2 09/10/2020 1228   CL 107 09/10/2020 1228   CO2 23 09/10/2020 1228   GLUCOSE 64 (L) 09/10/2020 1228   BUN 17 09/10/2020 1228   CREATININE 1.11 (H) 09/10/2020 1228   CALCIUM 9.2 09/10/2020 1228   PROT 6.8 09/10/2020 1228   ALBUMIN 3.7 09/10/2020 1228   AST 53 (H) 09/10/2020 1228   ALT 43 09/10/2020 1228   ALKPHOS 299 (H) 09/10/2020 1228  BILITOT 0.4 09/10/2020 1228   GFRNONAA 56 (L) 09/10/2020 1228   GFRAA >60 10/03/2019 1133   Lab Results  Component Value Date   WBC 11.5 (H) 09/24/2020   NEUTROABS 8.9 (H) 09/24/2020   HGB 12.5 09/24/2020   HCT 36.4 09/24/2020   MCV 94.8 09/24/2020   PLT 98 (L) 09/24/2020     Assessment/Plan Glioblastoma, IDH-1 WT  Jillian Gomez is clinically stable today.  No new or progressive deficits. Labs demonstrate modest thrombocytopenia, proteinuria.  CCNU is Day 24/42 of cycle #5.  We will continue with single agent Avastin today, and con't cycle #5 of oral CCNU at 80m/m2 dose level.  Pegfilgrastim should be administered at least 24 hours s/p CCNU dosing.  Chemotherapy should be held for the following:   ANC less than 1000  Platelets less than 100,000  LFT or creatinine greater than 2x ULN  If clinical concerns/contraindications develop  Avastin should be held for the following:   ANC less than 500  Platelets less than 50,000  LFT or creatinine  greater than 2x ULN  If clinical concerns/contraindications develop  We ask that RNadyne Coombesreturn to clinic in 2 weeks with labs for review prior to next infusion.    All questions were answered. The patient knows to call the clinic with any problems, questions or concerns. No barriers to learning were detected.  I have spent a total of 30 minutes of face-to-face and non-face-to-face time, excluding clinical staff time, preparing to see patient, ordering tests and/or medications, counseling the patient, and independently interpreting results and communicating results to the patient/family/caregiver    ZVentura Sellers MD Medical Director of Neuro-Oncology [facility]09/20/22 12:40 PM

## 2020-09-24 NOTE — Patient Instructions (Signed)
[facility]  Discharge Instructions: Thank you for choosing [facility] to provide your oncology and hematology care.   If you have a lab appointment with the Cancer Center, please go directly to the Cancer Center and check in at the registration area.   Wear comfortable clothing and clothing appropriate for easy access to any Portacath or PICC line.   We strive to give you quality time with your provider. You may need to reschedule your appointment if you arrive late (15 or more minutes).  Arriving late affects you and other patients whose appointments are after yours.  Also, if you miss three or more appointments without notifying the office, you may be dismissed from the clinic at the provider's discretion.      For prescription refill requests, have your pharmacy contact our office and allow 72 hours for refills to be completed.    Today you received the following chemotherapy and/or immunotherapy agents Avastin       To help prevent nausea and vomiting after your treatment, we encourage you to take your nausea medication as directed.  BELOW ARE SYMPTOMS THAT SHOULD BE REPORTED IMMEDIATELY: *FEVER GREATER THAN 100.4 F (38 C) OR HIGHER *CHILLS OR SWEATING *NAUSEA AND VOMITING THAT IS NOT CONTROLLED WITH YOUR NAUSEA MEDICATION *UNUSUAL SHORTNESS OF BREATH *UNUSUAL BRUISING OR BLEEDING *URINARY PROBLEMS (pain or burning when urinating, or frequent urination) *BOWEL PROBLEMS (unusual diarrhea, constipation, pain near the anus) TENDERNESS IN MOUTH AND THROAT WITH OR WITHOUT PRESENCE OF ULCERS (sore throat, sores in mouth, or a toothache) UNUSUAL RASH, SWELLING OR PAIN  UNUSUAL VAGINAL DISCHARGE OR ITCHING   Items with * indicate a potential emergency and should be followed up as soon as possible or go to the Emergency Department if any problems should occur.  Please show the CHEMOTHERAPY ALERT CARD or IMMUNOTHERAPY ALERT CARD at check-in to  the Emergency Department and triage nurse.  Should you have questions after your visit or need to cancel or reschedule your appointment, please contact [facility]  Dept: [phone]  and follow the prompts.  Office hours are 8:00 a.m. to 4:30 p.m. Monday - Friday. Please note that voicemails left after 4:00 p.m. may not be returned until the following business day.  We are closed weekends and major holidays. You have access to a nurse at all times for urgent questions. Please call the main number to the clinic Dept: [phone] and follow the prompts.   For any non-urgent questions, you may also contact your provider using MyChart. We now offer e-Visits for anyone 18 and older to request care online for non-urgent symptoms. For details visit [email or web address].   Also download the MyChart app! Go to the app store, search "MyChart", open the app, select [facility], and log in with your MyChart username and password.  Due to Covid, a mask is required upon entering the hospital/clinic. If you do not have a mask, one will be given to you upon arrival. For doctor visits, patients may have 1 support person aged 18 or older with them. For treatment visits, patients cannot have anyone with them due to current Covid guidelines and our immunocompromised population.   

## 2020-09-30 DIAGNOSIS — E039 Hypothyroidism, unspecified: Secondary | ICD-10-CM | POA: Diagnosis not present

## 2020-10-08 ENCOUNTER — Inpatient Hospital Stay: Payer: BC Managed Care – PPO | Attending: Internal Medicine

## 2020-10-08 ENCOUNTER — Other Ambulatory Visit (HOSPITAL_COMMUNITY): Payer: Self-pay

## 2020-10-08 ENCOUNTER — Inpatient Hospital Stay: Payer: BC Managed Care – PPO | Admitting: Internal Medicine

## 2020-10-08 ENCOUNTER — Inpatient Hospital Stay: Payer: BC Managed Care – PPO

## 2020-10-08 ENCOUNTER — Other Ambulatory Visit: Payer: Self-pay

## 2020-10-08 VITALS — BP 160/90 | HR 89 | Temp 97.8°F | Resp 16 | Ht 62.0 in | Wt 101.1 lb

## 2020-10-08 DIAGNOSIS — Z9221 Personal history of antineoplastic chemotherapy: Secondary | ICD-10-CM | POA: Diagnosis not present

## 2020-10-08 DIAGNOSIS — R809 Proteinuria, unspecified: Secondary | ICD-10-CM | POA: Insufficient documentation

## 2020-10-08 DIAGNOSIS — Z923 Personal history of irradiation: Secondary | ICD-10-CM | POA: Insufficient documentation

## 2020-10-08 DIAGNOSIS — C719 Malignant neoplasm of brain, unspecified: Secondary | ICD-10-CM

## 2020-10-08 DIAGNOSIS — Z79899 Other long term (current) drug therapy: Secondary | ICD-10-CM | POA: Insufficient documentation

## 2020-10-08 DIAGNOSIS — C714 Malignant neoplasm of occipital lobe: Secondary | ICD-10-CM | POA: Insufficient documentation

## 2020-10-08 DIAGNOSIS — E039 Hypothyroidism, unspecified: Secondary | ICD-10-CM | POA: Insufficient documentation

## 2020-10-08 DIAGNOSIS — D696 Thrombocytopenia, unspecified: Secondary | ICD-10-CM | POA: Diagnosis not present

## 2020-10-08 DIAGNOSIS — Z23 Encounter for immunization: Secondary | ICD-10-CM | POA: Diagnosis not present

## 2020-10-08 LAB — CBC WITH DIFFERENTIAL (CANCER CENTER ONLY)
Abs Immature Granulocytes: 0.03 10*3/uL (ref 0.00–0.07)
Basophils Absolute: 0 10*3/uL (ref 0.0–0.1)
Basophils Relative: 0 %
Eosinophils Absolute: 0.2 10*3/uL (ref 0.0–0.5)
Eosinophils Relative: 3 %
HCT: 34.6 % — ABNORMAL LOW (ref 36.0–46.0)
Hemoglobin: 11.9 g/dL — ABNORMAL LOW (ref 12.0–15.0)
Immature Granulocytes: 0 %
Lymphocytes Relative: 13 %
Lymphs Abs: 1.2 10*3/uL (ref 0.7–4.0)
MCH: 32.9 pg (ref 26.0–34.0)
MCHC: 34.4 g/dL (ref 30.0–36.0)
MCV: 95.6 fL (ref 80.0–100.0)
Monocytes Absolute: 0.6 10*3/uL (ref 0.1–1.0)
Monocytes Relative: 7 %
Neutro Abs: 7.2 10*3/uL (ref 1.7–7.7)
Neutrophils Relative %: 77 %
Platelet Count: 85 10*3/uL — ABNORMAL LOW (ref 150–400)
RBC: 3.62 MIL/uL — ABNORMAL LOW (ref 3.87–5.11)
RDW: 14 % (ref 11.5–15.5)
WBC Count: 9.3 10*3/uL (ref 4.0–10.5)
nRBC: 0 % (ref 0.0–0.2)

## 2020-10-08 LAB — CMP (CANCER CENTER ONLY)
ALT: 34 U/L (ref 0–44)
AST: 40 U/L (ref 15–41)
Albumin: 3.5 g/dL (ref 3.5–5.0)
Alkaline Phosphatase: 146 U/L — ABNORMAL HIGH (ref 38–126)
Anion gap: 8 (ref 5–15)
BUN: 43 mg/dL — ABNORMAL HIGH (ref 8–23)
CO2: 23 mmol/L (ref 22–32)
Calcium: 9.2 mg/dL (ref 8.9–10.3)
Chloride: 107 mmol/L (ref 98–111)
Creatinine: 1.29 mg/dL — ABNORMAL HIGH (ref 0.44–1.00)
GFR, Estimated: 46 mL/min — ABNORMAL LOW (ref >60)
Glucose, Bld: 89 mg/dL (ref 70–99)
Potassium: 4.4 mmol/L (ref 3.5–5.1)
Sodium: 138 mmol/L (ref 135–145)
Total Bilirubin: 0.5 mg/dL (ref 0.3–1.2)
Total Protein: 6.2 g/dL — ABNORMAL LOW (ref 6.5–8.1)

## 2020-10-08 LAB — TOTAL PROTEIN, URINE DIPSTICK: Protein, ur: 300 mg/dL — AB

## 2020-10-08 NOTE — Progress Notes (Signed)
[facility] at [place], [place] [address] [phone]   Interval Evaluation  Date of Service: 10/08/20 Patient Name: Jillian Gomez Patient MRN: [id number] Patient DOB: 23-Jul-1955 Provider: Ventura Sellers, MD  Identifying Statement:  Jillian Gomez is a 65 y.o. female with left occipital glioblastoma   Oncologic History: Oncology History  Glioblastoma with isocitrate dehydrogenase gene wildtype ([place])  12/23/2018 Surgery   Craniotomy, left occipital resection by Dr. Kathyrn Sheriff.  Path is GBM IDH-wt   01/23/2019 - 03/03/2019 Radiation Therapy   IMRT with concurrent Temozolomide 19m/m2   03/28/2019 - 09/04/2019 Chemotherapy   5 cycles of adjuvant 5-day Temozolomide 2054mm2    09/01/2019 Progression   Progression of disease #1   09/19/2019 -  Chemotherapy   Begins second line therapy with oral CCNU 9030m2 q6 weeks, concurrent Avastin 103m71m IV q2 weeks   04/30/2020 -  Chemotherapy   Completes 4th cycle of CCNU/Avastin, transitions to Avastin monotherapy due to cytopenias   08/27/2020 -  Chemotherapy   Resumes CCNU+Avastin after periventricular DWI signal change noted on MRI      Biomarkers:  MGMT Unknown.  IDH 1/2 Wild type.  EGFR Unknown  TERT Unknown   Interval History:  Jillian MANKAsents today for avastin infusion. Fatigue has been stable, unchanged.  She does describe left lower leg cut injury with active bleeding; occurred after hitting leg accidentally against table.  Otherwise no new or progressive neurologic complaints.  Stable right sided vision, no clear change from prior.  No right sided weakness. No seizures or headaches.  H+P (01/09/19) Patient presented to medical attention in early December with ~2 months history of right sided visual impairment.  She describes fuzzy or blurry vision on that side which was progressive over time, leading to at least one fall.  MRI brain demonstrated enhancing left  occipital mass; this was subsequently resected by Dr. NundKathyrn Sheriff12/18/20.  She had no issues with surgery and has completed her steroid taper.  Continues on keppra daily but no history of any seizure. She has returned to work with only modest difficutly.  No issues walking or performing ADLs; lives alone but her sister and other family members are nearby.  Medications: Current Outpatient Medications on File Prior to Visit  Medication Sig Dispense Refill   Biotin 1 MG CAPS Take 1 mg by mouth daily.     CALCIUM PO Take 1 tablet by mouth daily.     doxycycline (VIBRA-TABS) 100 MG tablet Take 1 tablet by mouth daily.     EUTHYROX 50 MCG tablet Take 50 mcg by mouth daily.     levothyroxine (SYNTHROID) 75 MCG tablet Take 75 mcg by mouth daily.     naproxen sodium (ALEVE) 220 MG tablet Take 1 tablet (220 mg total) by mouth daily as needed (pain).     omeprazole (PRILOSEC) 20 MG capsule Take 1 capsule (20 mg total) by mouth daily. 30 capsule 3   ondansetron (ZOFRAN) 8 MG tablet Take 1 tablet (8 mg total) by mouth 2 (two) times daily as needed. Start on the third day after chemotherapy. 30 tablet 1   rosuvastatin (CRESTOR) 10 MG tablet Take 5 mg by mouth at bedtime.     No current facility-administered medications on file prior to visit.    Allergies:  Allergies  Allergen Reactions   Etodolac Other (See Comments)    GI upset   Macrobid [Nitrofurantoin] Rash   Past Medical History:  Past Medical History:  Diagnosis Date   High cholesterol    Hypothyroidism    Joint pain    Thrombocytopenia ([place]) 02/26/2020   Past Surgical History:  Past Surgical History:  Procedure Laterality Date   APPLICATION OF CRANIAL NAVIGATION Left 12/23/2018   Procedure: APPLICATION OF CRANIAL NAVIGATION;  Surgeon: Consuella Lose, MD;  Location: [facility];  Service: Neurosurgery;  Laterality: Left;  APPLICATION OF CRANIAL NAVIGATION   BREAST BIOPSY Left    COLONOSCOPY     CRANIOTOMY Left 12/23/2018    Procedure: STEREOTACTIC LEFT OCCIPITAL CRANIOTOMY FOR RESECTION OF TUMOR;  Surgeon: Consuella Lose, MD;  Location: [facility];  Service: Neurosurgery;  Laterality: Left;  STEREOTACTIC LEFT OCCIPITAL CRANIOTOMY FOR RESECTION OF TUMOR   LASIK     Social History:  Social History   Socioeconomic History   Marital status: Single    Spouse name: Not on file   Number of children: Not on file   Years of education: Not on file   Highest education level: Not on file  Occupational History   Not on file  Tobacco Use   Smoking status: Never   Smokeless tobacco: Never  Vaping Use   Vaping Use: Never used  Substance and Sexual Activity   Alcohol use: Not Currently   Drug use: Never   Sexual activity: Not on file  Other Topics Concern   Not on file  Social History Narrative   Not on file   Social Determinants of Health   Financial Resource Strain: Not on file  Food Insecurity: Not on file  Transportation Needs: Not on file  Physical Activity: Not on file  Stress: Not on file  Social Connections: Not on file  Intimate Partner Violence: Not on file   Family History: No family history on file.  Review of Systems: Constitutional: Denies fevers, chills or abnormal weight loss Eyes: Denies blurriness of vision Gastrointestinal:  Denies nausea, constipation, diarrhea GU: Denies dysuria or incontinence Skin: Denies abnormal skin rashes Musculoskeletal: Denies joint pain, back or neck discomfort.  Behavioral/Psych: Denies anxiety, mood instability  Physical Exam: Vitals:   10/08/20 1247  BP: (!) 160/90  Pulse: 89  Resp: 16  Temp: 97.8 F (36.6 C)  SpO2: 100%   KPS: 80. General: Alert, cooperative, pleasant, in no acute distress Head: Craniotomy scar noted, dry and intact. EENT: No conjunctival injection or scleral icterus. Oral mucosa moist Lungs: Resp effort normal Cardiac: Regular rate and rhythm Abdomen: Soft, non-distended abdomen Skin: No rashes cyanosis or  petechiae. Extremities: Left shin skin level cut, bleeding  Neurologic Exam: Mental Status: Awake, alert, attentive to examiner. Oriented to self and environment. Language is notable for mild transcortical expressive dyshpasia. Cranial Nerves: Visual acuity is grossly normal. Right homonymous hemianopia. Extra-ocular movements intact. No ptosis. Face is symmetric, tongue midline. Motor: Tone and bulk are normal. Power is full in both arms and legs. Reflexes are symmetric, no pathologic reflexes present. Intact finger to nose bilaterally Sensory: Intact to light touch and temperature Gait: Normal and tandem gait is deferred.   Labs: I have reviewed the data as listed    Component Value Date/Time   NA 139 09/24/2020 1216   K 4.2 09/24/2020 1216   CL 106 09/24/2020 1216   CO2 24 09/24/2020 1216   GLUCOSE 86 09/24/2020 1216   BUN 35 (H) 09/24/2020 1216   CREATININE 1.17 (H) 09/24/2020 1216   CALCIUM 9.6 09/24/2020 1216   PROT 6.2 (L) 09/24/2020 1216   ALBUMIN 3.4 (L) 09/24/2020  1216   AST 34 09/24/2020 1216   ALT 25 09/24/2020 1216   ALKPHOS 175 (H) 09/24/2020 1216   BILITOT 0.5 09/24/2020 1216   GFRNONAA 52 (L) 09/24/2020 1216   GFRAA >60 10/03/2019 1133   Lab Results  Component Value Date   WBC 11.5 (H) 09/24/2020   NEUTROABS 8.9 (H) 09/24/2020   HGB 12.5 09/24/2020   HCT 36.4 09/24/2020   MCV 94.8 09/24/2020   PLT 98 (L) 09/24/2020     Assessment/Plan Glioblastoma, IDH-1 WT  Jillian Gomez is clinically stable today.  No new or progressive deficits.  Labs again demonstrate modest thrombocytopenia, proteinuria.  CCNU is Day 42/42 of cycle #5.  We will defer Avastin today due to active bleeding soft tissue injury (dressed at bedside today).  Further CCNU will be deferred 2 weeks given thrombocytopenia today.  Pegfilgrastim will still be administered at least 24 hours s/p CCNU dosing after next cycle.  Chemotherapy should be held for the following:   ANC less than  1000  Platelets less than 100,000  LFT or creatinine greater than 2x ULN  If clinical concerns/contraindications develop  Avastin should be held for the following:   ANC less than 500  Platelets less than 50,000  LFT or creatinine greater than 2x ULN  If clinical concerns/contraindications develop  We ask that Jillian Gomez return to clinic in 2 weeks with labs for review prior to next infusion and start of CCNU cycle #6.    All questions were answered. The patient knows to call the clinic with any problems, questions or concerns. No barriers to learning were detected.  I have spent a total of 30 minutes of face-to-face and non-face-to-face time, excluding clinical staff time, preparing to see patient, ordering tests and/or medications, counseling the patient, and independently interpreting results and communicating results to the patient/family/caregiver    Ventura Sellers, MD Medical Director of Neuro-Oncology [facility] [place] 10/08/20 12:26 PM

## 2020-10-10 ENCOUNTER — Other Ambulatory Visit (HOSPITAL_COMMUNITY): Payer: Self-pay

## 2020-10-11 ENCOUNTER — Encounter: Payer: Self-pay | Admitting: Gastroenterology

## 2020-10-22 ENCOUNTER — Inpatient Hospital Stay: Payer: BC Managed Care – PPO

## 2020-10-22 ENCOUNTER — Other Ambulatory Visit (HOSPITAL_COMMUNITY): Payer: Self-pay

## 2020-10-22 ENCOUNTER — Inpatient Hospital Stay (HOSPITAL_BASED_OUTPATIENT_CLINIC_OR_DEPARTMENT_OTHER): Payer: BC Managed Care – PPO | Admitting: Internal Medicine

## 2020-10-22 ENCOUNTER — Other Ambulatory Visit: Payer: Self-pay

## 2020-10-22 VITALS — BP 135/67 | HR 73

## 2020-10-22 VITALS — BP 178/76 | HR 73 | Temp 97.7°F | Resp 18 | Ht 62.0 in | Wt 100.9 lb

## 2020-10-22 DIAGNOSIS — D696 Thrombocytopenia, unspecified: Secondary | ICD-10-CM | POA: Diagnosis not present

## 2020-10-22 DIAGNOSIS — C719 Malignant neoplasm of brain, unspecified: Secondary | ICD-10-CM

## 2020-10-22 DIAGNOSIS — C714 Malignant neoplasm of occipital lobe: Secondary | ICD-10-CM | POA: Diagnosis not present

## 2020-10-22 DIAGNOSIS — Z923 Personal history of irradiation: Secondary | ICD-10-CM | POA: Diagnosis not present

## 2020-10-22 DIAGNOSIS — Z23 Encounter for immunization: Secondary | ICD-10-CM

## 2020-10-22 DIAGNOSIS — E039 Hypothyroidism, unspecified: Secondary | ICD-10-CM | POA: Diagnosis not present

## 2020-10-22 DIAGNOSIS — Z9221 Personal history of antineoplastic chemotherapy: Secondary | ICD-10-CM | POA: Diagnosis not present

## 2020-10-22 DIAGNOSIS — Z79899 Other long term (current) drug therapy: Secondary | ICD-10-CM | POA: Diagnosis not present

## 2020-10-22 DIAGNOSIS — R809 Proteinuria, unspecified: Secondary | ICD-10-CM | POA: Diagnosis not present

## 2020-10-22 LAB — CMP (CANCER CENTER ONLY)
ALT: 20 U/L (ref 0–44)
AST: 25 U/L (ref 15–41)
Albumin: 3.2 g/dL — ABNORMAL LOW (ref 3.5–5.0)
Alkaline Phosphatase: 129 U/L — ABNORMAL HIGH (ref 38–126)
Anion gap: 10 (ref 5–15)
BUN: 22 mg/dL (ref 8–23)
CO2: 25 mmol/L (ref 22–32)
Calcium: 9.4 mg/dL (ref 8.9–10.3)
Chloride: 108 mmol/L (ref 98–111)
Creatinine: 1.23 mg/dL — ABNORMAL HIGH (ref 0.44–1.00)
GFR, Estimated: 49 mL/min — ABNORMAL LOW (ref >60)
Glucose, Bld: 81 mg/dL (ref 70–99)
Potassium: 4.6 mmol/L (ref 3.5–5.1)
Sodium: 143 mmol/L (ref 135–145)
Total Bilirubin: 0.7 mg/dL (ref 0.3–1.2)
Total Protein: 5.8 g/dL — ABNORMAL LOW (ref 6.5–8.1)

## 2020-10-22 LAB — CBC WITH DIFFERENTIAL (CANCER CENTER ONLY)
Abs Immature Granulocytes: 0.02 10*3/uL (ref 0.00–0.07)
Basophils Absolute: 0 10*3/uL (ref 0.0–0.1)
Basophils Relative: 1 %
Eosinophils Absolute: 0.2 10*3/uL (ref 0.0–0.5)
Eosinophils Relative: 3 %
HCT: 32.7 % — ABNORMAL LOW (ref 36.0–46.0)
Hemoglobin: 11 g/dL — ABNORMAL LOW (ref 12.0–15.0)
Immature Granulocytes: 0 %
Lymphocytes Relative: 19 %
Lymphs Abs: 1.1 10*3/uL (ref 0.7–4.0)
MCH: 32.4 pg (ref 26.0–34.0)
MCHC: 33.6 g/dL (ref 30.0–36.0)
MCV: 96.2 fL (ref 80.0–100.0)
Monocytes Absolute: 0.5 10*3/uL (ref 0.1–1.0)
Monocytes Relative: 8 %
Neutro Abs: 4 10*3/uL (ref 1.7–7.7)
Neutrophils Relative %: 69 %
Platelet Count: 92 10*3/uL — ABNORMAL LOW (ref 150–400)
RBC: 3.4 MIL/uL — ABNORMAL LOW (ref 3.87–5.11)
RDW: 13.4 % (ref 11.5–15.5)
WBC Count: 5.8 10*3/uL (ref 4.0–10.5)
nRBC: 0 % (ref 0.0–0.2)

## 2020-10-22 LAB — TOTAL PROTEIN, URINE DIPSTICK: Protein, ur: 300 mg/dL — AB

## 2020-10-22 MED ORDER — INFLUENZA VAC SPLIT QUAD 0.5 ML IM SUSY
0.5000 mL | PREFILLED_SYRINGE | Freq: Once | INTRAMUSCULAR | Status: AC
  Administered 2020-10-22: 0.5 mL via INTRAMUSCULAR
  Filled 2020-10-22: qty 0.5

## 2020-10-22 MED ORDER — LOMUSTINE 100 MG PO CAPS
100.0000 mg | ORAL_CAPSULE | Freq: Once | ORAL | 0 refills | Status: AC
  Filled 2020-10-22 – 2020-10-23 (×2): qty 1, 1d supply, fill #0

## 2020-10-22 MED ORDER — AMLODIPINE BESYLATE 2.5 MG PO TABS: 2.5000 mg | ORAL_TABLET | Freq: Every day | ORAL | 3 refills | Status: DC

## 2020-10-22 MED ORDER — SODIUM CHLORIDE 0.9 % IV SOLN
10.0000 mg/kg | Freq: Once | INTRAVENOUS | Status: AC
  Administered 2020-10-22: 450 mg via INTRAVENOUS
  Filled 2020-10-22: qty 16

## 2020-10-22 MED ORDER — SODIUM CHLORIDE 0.9 % IV SOLN: Freq: Once | INTRAVENOUS | Status: AC

## 2020-10-22 MED ORDER — LOMUSTINE 40 MG PO CAPS
40.0000 mg | ORAL_CAPSULE | Freq: Once | ORAL | 0 refills | Status: AC
  Filled 2020-10-22 – 2020-10-23 (×2): qty 1, 1d supply, fill #0

## 2020-10-22 NOTE — Progress Notes (Signed)
Patient ok to get treatment (Avastin) 10/22/2020 with vitals and labs per Dr Mickeal Skinner.  He is ordering amlodipine to start at home.   Patient will take next dose of oral chemo (CCNU) at home on 11/03/2020 and when she comes back 2 days later for next Avastin she will also get pegfilgrastim injection.

## 2020-10-22 NOTE — Progress Notes (Signed)
[facility] at [place], [place] [address] [phone]   Interval Evaluation  Date of Service: 10/22/20 Patient Name: Jillian Gomez Patient MRN: [id number] Patient DOB: 07-21-1955 Provider: Ventura Sellers, MD  Identifying Statement:  Jillian Gomez is a 65 y.o. female with left occipital glioblastoma   Oncologic History: Oncology History  Glioblastoma with isocitrate dehydrogenase gene wildtype ([place])  12/23/2018 Surgery   Craniotomy, left occipital resection by Dr. Kathyrn Sheriff.  Path is GBM IDH-wt   01/23/2019 - 03/03/2019 Radiation Therapy   IMRT with concurrent Temozolomide 37m/m2   03/28/2019 - 09/04/2019 Chemotherapy   5 cycles of adjuvant 5-day Temozolomide 2041mm2    09/01/2019 Progression   Progression of disease #1   09/19/2019 -  Chemotherapy   Begins second line therapy with oral CCNU 9048m2 q6 weeks, concurrent Avastin 53m33m IV q2 weeks   04/30/2020 -  Chemotherapy   Completes 4th cycle of CCNU/Avastin, transitions to Avastin monotherapy due to cytopenias   08/27/2020 -  Chemotherapy   Resumes CCNU+Avastin after periventricular DWI signal change noted on MRI      Biomarkers:  MGMT Unknown.  IDH 1/2 Wild type.  EGFR Unknown  TERT Unknown   Interval History:  Jillian HOGATEsents today for avastin infusion.  Her left leg injury from recent fall has healed over quite well, she now has a scab. Fatigue has been stable, unchanged. Otherwise no new or progressive neurologic complaints.  Stable right sided vision, no clear change from prior.  No right sided weakness. No seizures or headaches.  H+P (01/09/19) Patient presented to medical attention in early December with ~2 months history of right sided visual impairment.  She describes fuzzy or blurry vision on that side which was progressive over time, leading to at least one fall.  MRI brain demonstrated enhancing left occipital mass; this was subsequently  resected by Dr. NundKathyrn Sheriff12/18/20.  She had no issues with surgery and has completed her steroid taper.  Continues on keppra daily but no history of any seizure. She has returned to work with only modest difficutly.  No issues walking or performing ADLs; lives alone but her sister and other family members are nearby.  Medications: Current Outpatient Medications on File Prior to Visit  Medication Sig Dispense Refill   Biotin 1 MG CAPS Take 1 mg by mouth daily.     CALCIUM PO Take 1 tablet by mouth daily.     doxycycline (VIBRA-TABS) 100 MG tablet Take 1 tablet by mouth daily.     EUTHYROX 50 MCG tablet Take 50 mcg by mouth daily.     levothyroxine (SYNTHROID) 75 MCG tablet Take 75 mcg by mouth daily.     naproxen sodium (ALEVE) 220 MG tablet Take 1 tablet (220 mg total) by mouth daily as needed (pain).     omeprazole (PRILOSEC) 20 MG capsule Take 1 capsule (20 mg total) by mouth daily. 30 capsule 3   ondansetron (ZOFRAN) 8 MG tablet Take 1 tablet (8 mg total) by mouth 2 (two) times daily as needed. Start on the third day after chemotherapy. 30 tablet 1   rosuvastatin (CRESTOR) 10 MG tablet Take 5 mg by mouth at bedtime.     No current facility-administered medications on file prior to visit.    Allergies:  Allergies  Allergen Reactions   Etodolac Other (See Comments)    GI upset   Macrobid [Nitrofurantoin] Rash   Past Medical History:  Past  Medical History:  Diagnosis Date   High cholesterol    Hypothyroidism    Joint pain    Thrombocytopenia ([place]) 02/26/2020   Past Surgical History:  Past Surgical History:  Procedure Laterality Date   APPLICATION OF CRANIAL NAVIGATION Left 12/23/2018   Procedure: APPLICATION OF CRANIAL NAVIGATION;  Surgeon: Consuella Lose, MD;  Location: [facility];  Service: Neurosurgery;  Laterality: Left;  APPLICATION OF CRANIAL NAVIGATION   BREAST BIOPSY Left    COLONOSCOPY     CRANIOTOMY Left 12/23/2018   Procedure: STEREOTACTIC LEFT OCCIPITAL  CRANIOTOMY FOR RESECTION OF TUMOR;  Surgeon: Consuella Lose, MD;  Location: [facility];  Service: Neurosurgery;  Laterality: Left;  STEREOTACTIC LEFT OCCIPITAL CRANIOTOMY FOR RESECTION OF TUMOR   LASIK     Social History:  Social History   Socioeconomic History   Marital status: Single    Spouse name: Not on file   Number of children: Not on file   Years of education: Not on file   Highest education level: Not on file  Occupational History   Not on file  Tobacco Use   Smoking status: Never   Smokeless tobacco: Never  Vaping Use   Vaping Use: Never used  Substance and Sexual Activity   Alcohol use: Not Currently   Drug use: Never   Sexual activity: Not on file  Other Topics Concern   Not on file  Social History Narrative   Not on file   Social Determinants of Health   Financial Resource Strain: Not on file  Food Insecurity: Not on file  Transportation Needs: Not on file  Physical Activity: Not on file  Stress: Not on file  Social Connections: Not on file  Intimate Partner Violence: Not on file   Family History: No family history on file.  Review of Systems: Constitutional: Denies fevers, chills or abnormal weight loss Eyes: Denies blurriness of vision Gastrointestinal:  Denies nausea, constipation, diarrhea GU: Denies dysuria or incontinence Skin: Denies abnormal skin rashes Musculoskeletal: Denies joint pain, back or neck discomfort.  Behavioral/Psych: Denies anxiety, mood instability  Physical Exam: Vitals:   10/22/20 0931  BP: (!) 178/76  Pulse: 73  Resp: 18  Temp: 97.7 F (36.5 C)  SpO2: 100%   KPS: 80. General: Alert, cooperative, pleasant, in no acute distress Head: Craniotomy scar noted, dry and intact. EENT: No conjunctival injection or scleral icterus. Oral mucosa moist Lungs: Resp effort normal Cardiac: Regular rate and rhythm Abdomen: Soft, non-distended abdomen Skin: No rashes cyanosis or petechiae. Extremities: Left shin skin level cut,  bleeding  Neurologic Exam: Mental Status: Awake, alert, attentive to examiner. Oriented to self and environment. Language is notable for mild transcortical expressive dyshpasia. Cranial Nerves: Visual acuity is grossly normal. Right homonymous hemianopia. Extra-ocular movements intact. No ptosis. Face is symmetric, tongue midline. Motor: Tone and bulk are normal. Power is full in both arms and legs. Reflexes are symmetric, no pathologic reflexes present. Intact finger to nose bilaterally Sensory: Intact to light touch and temperature Gait: Normal and tandem gait is deferred.   Labs: I have reviewed the data as listed    Component Value Date/Time   NA 138 10/08/2020 1215   K 4.4 10/08/2020 1215   CL 107 10/08/2020 1215   CO2 23 10/08/2020 1215   GLUCOSE 89 10/08/2020 1215   BUN 43 (H) 10/08/2020 1215   CREATININE 1.29 (H) 10/08/2020 1215   CALCIUM 9.2 10/08/2020 1215   PROT 6.2 (L) 10/08/2020 1215   ALBUMIN 3.5 10/08/2020 1215  AST 40 10/08/2020 1215   ALT 34 10/08/2020 1215   ALKPHOS 146 (H) 10/08/2020 1215   BILITOT 0.5 10/08/2020 1215   GFRNONAA 46 (L) 10/08/2020 1215   GFRAA >60 10/03/2019 1133   Lab Results  Component Value Date   WBC 5.8 10/22/2020   NEUTROABS 4.0 10/22/2020   HGB 11.0 (L) 10/22/2020   HCT 32.7 (L) 10/22/2020   MCV 96.2 10/22/2020   PLT 92 (L) 10/22/2020     Assessment/Plan Glioblastoma, IDH-1 WT  KASSIA DEMARINIS is clinically stable today.  No new or progressive deficits.  Labs demonstrate modestly improved thrombocytopenia, proteinuria.  CCNU cycle #5 is now complete.  She is cleared for avastin infusion today.  Next cycle of CCNU should be dosed on 10/30, pegfilgrastim will be administered on 11/1 for bone marrow support.  Chemotherapy should be held for the following:   ANC less than 1000  Platelets less than 100,000  LFT or creatinine greater than 2x ULN  If clinical concerns/contraindications develop  Avastin should be held for  the following:   ANC less than 500  Platelets less than 50,000  LFT or creatinine greater than 2x ULN  If clinical concerns/contraindications develop  For hypertension noted over past several visits, likely 2/2 avastin, will initiate amlodipine 2.34m daily.  We ask that RJESSICE MADILLreturn to clinic in 2 weeks with labs for review prior to next infusion and start of CCNU cycle #6.    All questions were answered. The patient knows to call the clinic with any problems, questions or concerns. No barriers to learning were detected.  I have spent a total of 30 minutes of face-to-face and non-face-to-face time, excluding clinical staff time, preparing to see patient, ordering tests and/or medications, counseling the patient, and independently interpreting results and communicating results to the patient/family/caregiver    ZVentura Sellers MD Medical Director of Neuro[facility]10/18/22 9:38 AM

## 2020-10-22 NOTE — Patient Instructions (Signed)
[facility] MEDICAL ONCOLOGY  Discharge Instructions: Thank you for choosing [facility] to provide your oncology and hematology care.   If you have a lab appointment with the Cancer Center, please go directly to the Cancer Center and check in at the registration area.   Wear comfortable clothing and clothing appropriate for easy access to any Portacath or PICC line.   We strive to give you quality time with your provider. You may need to reschedule your appointment if you arrive late (15 or more minutes).  Arriving late affects you and other patients whose appointments are after yours.  Also, if you miss three or more appointments without notifying the office, you may be dismissed from the clinic at the provider's discretion.      For prescription refill requests, have your pharmacy contact our office and allow 72 hours for refills to be completed.    Today you received the following chemotherapy and/or immunotherapy agent: Bevacizumab   To help prevent nausea and vomiting after your treatment, we encourage you to take your nausea medication as directed.  BELOW ARE SYMPTOMS THAT SHOULD BE REPORTED IMMEDIATELY: *FEVER GREATER THAN 100.4 F (38 C) OR HIGHER *CHILLS OR SWEATING *NAUSEA AND VOMITING THAT IS NOT CONTROLLED WITH YOUR NAUSEA MEDICATION *UNUSUAL SHORTNESS OF BREATH *UNUSUAL BRUISING OR BLEEDING *URINARY PROBLEMS (pain or burning when urinating, or frequent urination) *BOWEL PROBLEMS (unusual diarrhea, constipation, pain near the anus) TENDERNESS IN MOUTH AND THROAT WITH OR WITHOUT PRESENCE OF ULCERS (sore throat, sores in mouth, or a toothache) UNUSUAL RASH, SWELLING OR PAIN  UNUSUAL VAGINAL DISCHARGE OR ITCHING   Items with * indicate a potential emergency and should be followed up as soon as possible or go to the Emergency Department if any problems should occur.  Please show the CHEMOTHERAPY ALERT CARD or IMMUNOTHERAPY ALERT CARD at check-in to  the Emergency Department and triage nurse.  Should you have questions after your visit or need to cancel or reschedule your appointment, please contact [facility]ICAL [facility]COL[facility]  Dept: [phone]  and follow the prompts.  Office hours are 8:00 a.m. to 4:30 p.m. Monday - Friday. Please note that voicemails left after 4:00 p.m. may not be returned until the following business day.  We are closed weekends and major holidays. You have access to a nurse at all times for urgent questions. Please call the main number to the clinic Dept: [phone] and follow the prompts.   For any non-urgent questions, you may also contact your provider using MyChart. We now offer e-Visits for anyone 18 and older to request care online for non-urgent symptoms. For details visit [email or web address].   Also download the MyChart app! Go to the app store, search "MyChart", open the app, select [facility], and log in with your MyChart username and password.  Due to Covid, a mask is required upon entering the hospital/clinic. If you do not have a mask, one will be given to you upon arrival. For doctor visits, patients may have 1 support person aged 18 or older with them. For treatment visits, patients cannot have anyone with them due to current Covid guidelines and our immunocompromised population.   

## 2020-10-23 ENCOUNTER — Other Ambulatory Visit: Payer: Self-pay | Admitting: Internal Medicine

## 2020-10-23 ENCOUNTER — Other Ambulatory Visit (HOSPITAL_COMMUNITY): Payer: Self-pay

## 2020-10-23 DIAGNOSIS — C719 Malignant neoplasm of brain, unspecified: Secondary | ICD-10-CM

## 2020-10-23 MED ORDER — ONDANSETRON HCL 8 MG PO TABS
8.0000 mg | ORAL_TABLET | Freq: Two times a day (BID) | ORAL | 1 refills | Status: DC | PRN
  Filled 2020-10-23: qty 30, 15d supply, fill #0

## 2020-10-28 ENCOUNTER — Other Ambulatory Visit (HOSPITAL_COMMUNITY): Payer: Self-pay

## 2020-11-05 ENCOUNTER — Inpatient Hospital Stay (HOSPITAL_BASED_OUTPATIENT_CLINIC_OR_DEPARTMENT_OTHER): Payer: BC Managed Care – PPO | Admitting: Internal Medicine

## 2020-11-05 ENCOUNTER — Inpatient Hospital Stay: Payer: BC Managed Care – PPO | Attending: Internal Medicine

## 2020-11-05 ENCOUNTER — Other Ambulatory Visit: Payer: Self-pay

## 2020-11-05 ENCOUNTER — Inpatient Hospital Stay: Payer: BC Managed Care – PPO

## 2020-11-05 VITALS — BP 147/84 | HR 80 | Temp 98.1°F | Resp 18 | Ht 62.0 in | Wt 101.6 lb

## 2020-11-05 DIAGNOSIS — C719 Malignant neoplasm of brain, unspecified: Secondary | ICD-10-CM

## 2020-11-05 DIAGNOSIS — C714 Malignant neoplasm of occipital lobe: Secondary | ICD-10-CM | POA: Insufficient documentation

## 2020-11-05 DIAGNOSIS — Z9221 Personal history of antineoplastic chemotherapy: Secondary | ICD-10-CM | POA: Insufficient documentation

## 2020-11-05 DIAGNOSIS — T451X5A Adverse effect of antineoplastic and immunosuppressive drugs, initial encounter: Secondary | ICD-10-CM

## 2020-11-05 DIAGNOSIS — D696 Thrombocytopenia, unspecified: Secondary | ICD-10-CM | POA: Diagnosis not present

## 2020-11-05 DIAGNOSIS — Z79899 Other long term (current) drug therapy: Secondary | ICD-10-CM | POA: Insufficient documentation

## 2020-11-05 DIAGNOSIS — Z5112 Encounter for antineoplastic immunotherapy: Secondary | ICD-10-CM | POA: Insufficient documentation

## 2020-11-05 DIAGNOSIS — Z5189 Encounter for other specified aftercare: Secondary | ICD-10-CM | POA: Insufficient documentation

## 2020-11-05 DIAGNOSIS — E7800 Pure hypercholesterolemia, unspecified: Secondary | ICD-10-CM | POA: Diagnosis not present

## 2020-11-05 DIAGNOSIS — E039 Hypothyroidism, unspecified: Secondary | ICD-10-CM | POA: Diagnosis not present

## 2020-11-05 DIAGNOSIS — D701 Agranulocytosis secondary to cancer chemotherapy: Secondary | ICD-10-CM

## 2020-11-05 DIAGNOSIS — R809 Proteinuria, unspecified: Secondary | ICD-10-CM | POA: Insufficient documentation

## 2020-11-05 DIAGNOSIS — Z923 Personal history of irradiation: Secondary | ICD-10-CM | POA: Insufficient documentation

## 2020-11-05 LAB — CMP (CANCER CENTER ONLY)
ALT: 39 U/L (ref 0–44)
AST: 49 U/L — ABNORMAL HIGH (ref 15–41)
Albumin: 3.6 g/dL (ref 3.5–5.0)
Alkaline Phosphatase: 169 U/L — ABNORMAL HIGH (ref 38–126)
Anion gap: 10 (ref 5–15)
BUN: 39 mg/dL — ABNORMAL HIGH (ref 8–23)
CO2: 24 mmol/L (ref 22–32)
Calcium: 9.2 mg/dL (ref 8.9–10.3)
Chloride: 106 mmol/L (ref 98–111)
Creatinine: 1.46 mg/dL — ABNORMAL HIGH (ref 0.44–1.00)
GFR, Estimated: 40 mL/min — ABNORMAL LOW (ref >60)
Glucose, Bld: 75 mg/dL (ref 70–99)
Potassium: 4.3 mmol/L (ref 3.5–5.1)
Sodium: 140 mmol/L (ref 135–145)
Total Bilirubin: 0.6 mg/dL (ref 0.3–1.2)
Total Protein: 6.6 g/dL (ref 6.5–8.1)

## 2020-11-05 LAB — TOTAL PROTEIN, URINE DIPSTICK: Protein, ur: >2000 mg/dL — AB

## 2020-11-05 LAB — CBC WITH DIFFERENTIAL (CANCER CENTER ONLY)
Abs Immature Granulocytes: 0.01 10*3/uL (ref 0.00–0.07)
Basophils Absolute: 0 10*3/uL (ref 0.0–0.1)
Basophils Relative: 1 %
Eosinophils Absolute: 0.2 10*3/uL (ref 0.0–0.5)
Eosinophils Relative: 3 %
HCT: 34.2 % — ABNORMAL LOW (ref 36.0–46.0)
Hemoglobin: 12 g/dL (ref 12.0–15.0)
Immature Granulocytes: 0 %
Lymphocytes Relative: 19 %
Lymphs Abs: 1 10*3/uL (ref 0.7–4.0)
MCH: 33.2 pg (ref 26.0–34.0)
MCHC: 35.1 g/dL (ref 30.0–36.0)
MCV: 94.7 fL (ref 80.0–100.0)
Monocytes Absolute: 0.4 10*3/uL (ref 0.1–1.0)
Monocytes Relative: 8 %
Neutro Abs: 3.7 10*3/uL (ref 1.7–7.7)
Neutrophils Relative %: 69 %
Platelet Count: 87 10*3/uL — ABNORMAL LOW (ref 150–400)
RBC: 3.61 MIL/uL — ABNORMAL LOW (ref 3.87–5.11)
RDW: 13.2 % (ref 11.5–15.5)
WBC Count: 5.3 10*3/uL (ref 4.0–10.5)
nRBC: 0 % (ref 0.0–0.2)

## 2020-11-05 MED ORDER — PEGFILGRASTIM-CBQV 6 MG/0.6ML ~~LOC~~ SOSY
6.0000 mg | PREFILLED_SYRINGE | Freq: Once | SUBCUTANEOUS | Status: AC
  Administered 2020-11-05: 6 mg via SUBCUTANEOUS
  Filled 2020-11-05: qty 0.6

## 2020-11-05 NOTE — Progress Notes (Signed)
[facility] at [place], [place] [address] [phone]   Interval Evaluation  Date of Service: 11/05/20 Patient Name: Jillian Gomez Patient MRN: [id number] Patient DOB: [birth year] Provider: Ventura Sellers, MD  Identifying Statement:  Jillian Gomez is a 65 y.o. female with left occipital glioblastoma   Oncologic History: Oncology History  Glioblastoma with isocitrate dehydrogenase gene wildtype ([place])  12/23/2018 Surgery   Craniotomy, left occipital resection by Dr. Kathyrn Sheriff.  Path is GBM IDH-wt   01/23/2019 - 03/03/2019 Radiation Therapy   IMRT with concurrent Temozolomide 12m/m2   03/28/2019 - 09/04/2019 Chemotherapy   5 cycles of adjuvant 5-day Temozolomide 2070mm2    09/01/2019 Progression   Progression of disease #1   09/19/2019 -  Chemotherapy   Begins second line therapy with oral CCNU 9018m2 q6 weeks, concurrent Avastin 33m73m IV q2 weeks   04/30/2020 -  Chemotherapy   Completes 4th cycle of CCNU/Avastin, transitions to Avastin monotherapy due to cytopenias   08/27/2020 -  Chemotherapy   Resumes CCNU+Avastin after periventricular DWI signal change noted on MRI      Biomarkers:  MGMT Unknown.  IDH 1/2 Wild type.  EGFR Unknown  TERT Unknown   Interval History:  Jillian SANJUANsents today for avastin infusion.  No further extremity injuries. Fatigue has been stable, unchanged. Otherwise no new or progressive neurologic complaints.  Stable right sided vision, no clear change from prior.  No right sided weakness. No seizures or headaches.  H+P (01/09/19) Patient presented to medical attention in early December with ~2 months history of right sided visual impairment.  She describes fuzzy or blurry vision on that side which was progressive over time, leading to at least one fall.  MRI brain demonstrated enhancing left occipital mass; this was subsequently resected by Dr. NundKathyrn Sheriff12/18/20.  She had no issues  with surgery and has completed her steroid taper.  Continues on keppra daily but no history of any seizure. She has returned to work with only modest difficutly.  No issues walking or performing ADLs; lives alone but her sister and other family members are nearby.  Medications: Current Outpatient Medications on File Prior to Visit  Medication Sig Dispense Refill   amLODipine (NORVASC) 2.5 MG tablet Take 1 tablet (2.5 mg total) by mouth daily. 30 tablet 3   Biotin 1 MG CAPS Take 1 mg by mouth daily.     CALCIUM PO Take 1 tablet by mouth daily.     EUTHYROX 50 MCG tablet Take 50 mcg by mouth daily.     levothyroxine (SYNTHROID) 75 MCG tablet Take 75 mcg by mouth daily.     naproxen sodium (ALEVE) 220 MG tablet Take 1 tablet (220 mg total) by mouth daily as needed (pain).     omeprazole (PRILOSEC) 20 MG capsule Take 1 capsule (20 mg total) by mouth daily. 30 capsule 3   ondansetron (ZOFRAN) 8 MG tablet Take 1 tablet by mouth 2 (two) times daily as needed. Start on the third day after chemotherapy. 30 tablet 1   rosuvastatin (CRESTOR) 10 MG tablet Take 5 mg by mouth at bedtime.     doxycycline (VIBRA-TABS) 100 MG tablet Take 1 tablet by mouth daily. (Patient not taking: Reported on 11/05/2020)     No current facility-administered medications on file prior to visit.    Allergies:  Allergies  Allergen Reactions   Etodolac Other (See Comments)    GI upset   Macrobid [  Nitrofurantoin] Rash   Past Medical History:  Past Medical History:  Diagnosis Date   High cholesterol    Hypothyroidism    Joint pain    Thrombocytopenia ([place]) 02/26/2020   Past Surgical History:  Past Surgical History:  Procedure Laterality Date   APPLICATION OF CRANIAL NAVIGATION Left 12/23/2018   Procedure: APPLICATION OF CRANIAL NAVIGATION;  Surgeon: Consuella Lose, MD;  Location: [facility];  Service: Neurosurgery;  Laterality: Left;  APPLICATION OF CRANIAL NAVIGATION   BREAST BIOPSY Left    COLONOSCOPY      CRANIOTOMY Left 12/23/2018   Procedure: STEREOTACTIC LEFT OCCIPITAL CRANIOTOMY FOR RESECTION OF TUMOR;  Surgeon: Consuella Lose, MD;  Location: [facility];  Service: Neurosurgery;  Laterality: Left;  STEREOTACTIC LEFT OCCIPITAL CRANIOTOMY FOR RESECTION OF TUMOR   LASIK     Social History:  Social History   Socioeconomic History   Marital status: Single    Spouse name: Not on file   Number of children: Not on file   Years of education: Not on file   Highest education level: Not on file  Occupational History   Not on file  Tobacco Use   Smoking status: Never   Smokeless tobacco: Never  Vaping Use   Vaping Use: Never used  Substance and Sexual Activity   Alcohol use: Not Currently   Drug use: Never   Sexual activity: Not on file  Other Topics Concern   Not on file  Social History Narrative   Not on file   Social Determinants of Health   Financial Resource Strain: Not on file  Food Insecurity: Not on file  Transportation Needs: Not on file  Physical Activity: Not on file  Stress: Not on file  Social Connections: Not on file  Intimate Partner Violence: Not on file   Family History: No family history on file.  Review of Systems: Constitutional: Denies fevers, chills or abnormal weight loss Eyes: Denies blurriness of vision Gastrointestinal:  Denies nausea, constipation, diarrhea GU: Denies dysuria or incontinence Skin: Denies abnormal skin rashes Musculoskeletal: Denies joint pain, back or neck discomfort.  Behavioral/Psych: Denies anxiety, mood instability  Physical Exam: Vitals:   11/05/20 0938  BP: (!) 147/84  Pulse: 80  Resp: 18  Temp: 98.1 F (36.7 C)  SpO2: 100%   KPS: 80. General: Alert, cooperative, pleasant, in no acute distress Head: Craniotomy scar noted, dry and intact. EENT: No conjunctival injection or scleral icterus. Oral mucosa moist Lungs: Resp effort normal Cardiac: Regular rate and rhythm Abdomen: Soft, non-distended abdomen Skin: No  rashes cyanosis or petechiae. Extremities: Left shin skin level cut, bleeding  Neurologic Exam: Mental Status: Awake, alert, attentive to examiner. Oriented to self and environment. Language is notable for mild transcortical expressive dyshpasia. Cranial Nerves: Visual acuity is grossly normal. Right homonymous hemianopia. Extra-ocular movements intact. No ptosis. Face is symmetric, tongue midline. Motor: Tone and bulk are normal. Power is full in both arms and legs. Reflexes are symmetric, no pathologic reflexes present. Intact finger to nose bilaterally Sensory: Intact to light touch and temperature Gait: Normal and tandem gait is deferred.   Labs: I have reviewed the data as listed    Component Value Date/Time   NA 140 11/05/2020 0915   K 4.3 11/05/2020 0915   CL 106 11/05/2020 0915   CO2 24 11/05/2020 0915   GLUCOSE 75 11/05/2020 0915   BUN 39 (H) 11/05/2020 0915   CREATININE 1.46 (H) 11/05/2020 0915   CALCIUM 9.2 11/05/2020 0915   PROT 6.6 11/05/2020  0915   ALBUMIN 3.6 11/05/2020 0915   AST 49 (H) 11/05/2020 0915   ALT 39 11/05/2020 0915   ALKPHOS 169 (H) 11/05/2020 0915   BILITOT 0.6 11/05/2020 0915   GFRNONAA 40 (L) 11/05/2020 0915   GFRAA >60 10/03/2019 1133   Lab Results  Component Value Date   WBC 5.3 11/05/2020   NEUTROABS 3.7 11/05/2020   HGB 12.0 11/05/2020   HCT 34.2 (L) 11/05/2020   MCV 94.7 11/05/2020   PLT 87 (L) 11/05/2020     Assessment/Plan Glioblastoma, IDH-1 WT  Jillian Gomez is clinically stable today.  No new or progressive deficits.  Labs today demonstrate more advanced proteinuria and nephropathy.  CCNU cycle #6 was dosed on 11/03/20, per our instructions.  We will hold avastin infusion today due to proteinuria.  Pegfilgrastim will be administered on 11/1 for bone marrow support.  Chemotherapy should be held for the following:   ANC less than 1000  Platelets less than 100,000  LFT or creatinine greater than 2x ULN  If clinical  concerns/contraindications develop  Avastin should be held for the following:   ANC less than 500  Platelets less than 50,000  LFT or creatinine greater than 2x ULN  If clinical concerns/contraindications develop  May con't amlodipine 2.55m daily.  We ask that RMARQUIA COSTELLOreturn to clinic in 2 weeks with labs for review prior to next planned avastin infusion  All questions were answered. The patient knows to call the clinic with any problems, questions or concerns. No barriers to learning were detected.  I have spent a total of 30 minutes of face-to-face and non-face-to-face time, excluding clinical staff time, preparing to see patient, ordering tests and/or medications, counseling the patient, and independently interpreting results and communicating results to the patient/family/caregiver    ZVentura Sellers MD Medical Director of Neuro-Oncology [facility]11/01/22 10:15 AM

## 2020-11-05 NOTE — Patient Instructions (Signed)

## 2020-11-18 ENCOUNTER — Other Ambulatory Visit (HOSPITAL_COMMUNITY): Payer: Self-pay

## 2020-11-19 ENCOUNTER — Inpatient Hospital Stay (HOSPITAL_BASED_OUTPATIENT_CLINIC_OR_DEPARTMENT_OTHER): Payer: BC Managed Care – PPO | Admitting: Internal Medicine

## 2020-11-19 ENCOUNTER — Other Ambulatory Visit: Payer: Self-pay

## 2020-11-19 ENCOUNTER — Inpatient Hospital Stay: Payer: BC Managed Care – PPO

## 2020-11-19 VITALS — BP 140/79 | HR 89 | Temp 97.1°F | Resp 18 | Ht 62.0 in | Wt 99.2 lb

## 2020-11-19 DIAGNOSIS — C719 Malignant neoplasm of brain, unspecified: Secondary | ICD-10-CM

## 2020-11-19 DIAGNOSIS — Z5189 Encounter for other specified aftercare: Secondary | ICD-10-CM | POA: Diagnosis not present

## 2020-11-19 DIAGNOSIS — E039 Hypothyroidism, unspecified: Secondary | ICD-10-CM | POA: Diagnosis not present

## 2020-11-19 DIAGNOSIS — Z9221 Personal history of antineoplastic chemotherapy: Secondary | ICD-10-CM | POA: Diagnosis not present

## 2020-11-19 DIAGNOSIS — E7800 Pure hypercholesterolemia, unspecified: Secondary | ICD-10-CM | POA: Diagnosis not present

## 2020-11-19 DIAGNOSIS — D696 Thrombocytopenia, unspecified: Secondary | ICD-10-CM | POA: Diagnosis not present

## 2020-11-19 DIAGNOSIS — Z923 Personal history of irradiation: Secondary | ICD-10-CM | POA: Diagnosis not present

## 2020-11-19 DIAGNOSIS — R809 Proteinuria, unspecified: Secondary | ICD-10-CM | POA: Diagnosis not present

## 2020-11-19 DIAGNOSIS — Z79899 Other long term (current) drug therapy: Secondary | ICD-10-CM | POA: Diagnosis not present

## 2020-11-19 DIAGNOSIS — Z5112 Encounter for antineoplastic immunotherapy: Secondary | ICD-10-CM | POA: Diagnosis not present

## 2020-11-19 DIAGNOSIS — C714 Malignant neoplasm of occipital lobe: Secondary | ICD-10-CM | POA: Diagnosis not present

## 2020-11-19 LAB — CMP (CANCER CENTER ONLY)
ALT: 36 U/L (ref 0–44)
AST: 49 U/L — ABNORMAL HIGH (ref 15–41)
Albumin: 3.6 g/dL (ref 3.5–5.0)
Alkaline Phosphatase: 193 U/L — ABNORMAL HIGH (ref 38–126)
Anion gap: 10 (ref 5–15)
BUN: 31 mg/dL — ABNORMAL HIGH (ref 8–23)
CO2: 21 mmol/L — ABNORMAL LOW (ref 22–32)
Calcium: 9 mg/dL (ref 8.9–10.3)
Chloride: 109 mmol/L (ref 98–111)
Creatinine: 1.14 mg/dL — ABNORMAL HIGH (ref 0.44–1.00)
GFR, Estimated: 54 mL/min — ABNORMAL LOW (ref >60)
Glucose, Bld: 80 mg/dL (ref 70–99)
Potassium: 4.4 mmol/L (ref 3.5–5.1)
Sodium: 140 mmol/L (ref 135–145)
Total Bilirubin: 0.3 mg/dL (ref 0.3–1.2)
Total Protein: 6.4 g/dL — ABNORMAL LOW (ref 6.5–8.1)

## 2020-11-19 LAB — CBC WITH DIFFERENTIAL (CANCER CENTER ONLY)
Abs Immature Granulocytes: 0.05 10*3/uL (ref 0.00–0.07)
Basophils Absolute: 0.1 10*3/uL (ref 0.0–0.1)
Basophils Relative: 0 %
Eosinophils Absolute: 0.1 10*3/uL (ref 0.0–0.5)
Eosinophils Relative: 1 %
HCT: 31 % — ABNORMAL LOW (ref 36.0–46.0)
Hemoglobin: 10.4 g/dL — ABNORMAL LOW (ref 12.0–15.0)
Immature Granulocytes: 0 %
Lymphocytes Relative: 9 %
Lymphs Abs: 1.2 10*3/uL (ref 0.7–4.0)
MCH: 33 pg (ref 26.0–34.0)
MCHC: 33.5 g/dL (ref 30.0–36.0)
MCV: 98.4 fL (ref 80.0–100.0)
Monocytes Absolute: 0.9 10*3/uL (ref 0.1–1.0)
Monocytes Relative: 7 %
Neutro Abs: 10.9 10*3/uL — ABNORMAL HIGH (ref 1.7–7.7)
Neutrophils Relative %: 83 %
Platelet Count: 112 10*3/uL — ABNORMAL LOW (ref 150–400)
RBC: 3.15 MIL/uL — ABNORMAL LOW (ref 3.87–5.11)
RDW: 13.6 % (ref 11.5–15.5)
WBC Count: 13.1 10*3/uL — ABNORMAL HIGH (ref 4.0–10.5)
nRBC: 0 % (ref 0.0–0.2)

## 2020-11-19 LAB — TOTAL PROTEIN, URINE DIPSTICK: Protein, ur: 300 mg/dL — AB

## 2020-11-19 MED ORDER — SODIUM CHLORIDE 0.9 % IV SOLN
10.0000 mg/kg | Freq: Once | INTRAVENOUS | Status: AC
  Administered 2020-11-19: 450 mg via INTRAVENOUS
  Filled 2020-11-19: qty 16

## 2020-11-19 MED ORDER — SODIUM CHLORIDE 0.9 % IV SOLN: Freq: Once | INTRAVENOUS | Status: AC

## 2020-11-19 NOTE — Patient Instructions (Signed)
[facility] ONCOLOGY  Discharge Instructions: Thank you for choosing [facility] to provide your oncology and hematology care.   If you have a lab appointment with the [facility], please go directly to the [facility] and check in at the registration area.   Wear comfortable clothing and clothing appropriate for easy access to any Portacath or PICC line.   We strive to give you quality time with your provider. You may need to reschedule your appointment if you arrive late (15 or more minutes).  Arriving late affects you and other patients whose appointments are after yours.  Also, if you miss three or more appointments without notifying the office, you may be dismissed from the clinic at the provider's discretion.      For prescription refill requests, have your pharmacy contact our office and allow 72 hours for refills to be completed.    Today you received the following chemotherapy and/or immunotherapy agents Bevacizumab-awwb      To help prevent nausea and vomiting after your treatment, we encourage you to take your nausea medication as directed.  BELOW ARE SYMPTOMS THAT SHOULD BE REPORTED IMMEDIATELY: *FEVER GREATER THAN 100.4 F (38 C) OR HIGHER *CHILLS OR SWEATING *NAUSEA AND VOMITING THAT IS NOT CONTROLLED WITH YOUR NAUSEA MEDICATION *UNUSUAL SHORTNESS OF BREATH *UNUSUAL BRUISING OR BLEEDING *URINARY PROBLEMS (pain or burning when urinating, or frequent urination) *BOWEL PROBLEMS (unusual diarrhea, constipation, pain near the anus) TENDERNESS IN MOUTH AND THROAT WITH OR WITHOUT PRESENCE OF ULCERS (sore throat, sores in mouth, or a toothache) UNUSUAL RASH, SWELLING OR PAIN  UNUSUAL VAGINAL DISCHARGE OR ITCHING   Items with * indicate a potential emergency and should be followed up as soon as possible or go to the Emergency Department if any problems should occur.  Please show the CHEMOTHERAPY ALERT CARD or IMMUNOTHERAPY ALERT CARD at  check-in to the Emergency Department and triage nurse.  Should you have questions after your visit or need to cancel or reschedule your appointment, please contact [facility]  Dept: [phone]  and follow the prompts.  Office hours are 8:00 a.m. to 4:30 p.m. Monday - Friday. Please note that voicemails left after 4:00 p.m. may not be returned until the following business day.  We are closed weekends and major holidays. You have access to a nurse at all times for urgent questions. Please call the main number to the clinic Dept: [phone] and follow the prompts.   For any non-urgent questions, you may also contact your provider using MyChart. We now offer e-Visits for anyone 3 and older to request care online for non-urgent symptoms. For details visit [email or web address].   Also download the MyChart app! Go to the app store, search "MyChart", open the app, select [facility], and log in with your MyChart username and password.  Due to Covid, a mask is required upon entering the hospital/clinic. If you do not have a mask, one will be given to you upon arrival. For doctor visits, patients may have 1 support person aged 9 or older with them. For treatment visits, patients cannot have anyone with them due to current Covid guidelines and our immunocompromised population.

## 2020-11-19 NOTE — Progress Notes (Signed)
[facility] at [place] [place], [place] [address] [phone]   Interval Evaluation  Date of Service: 11/19/20 Patient Name: Jillian Gomez Patient MRN: [id number] Patient DOB: 18-Jan-1955 Provider: Ventura Sellers, MD  Identifying Statement:  Jillian Gomez is a 65 y.o. female with left occipital glioblastoma   Oncologic History: Oncology History  Glioblastoma with isocitrate dehydrogenase gene wildtype ([place])  12/23/2018 Surgery   Craniotomy, left occipital resection by Dr. Kathyrn Sheriff.  Path is GBM IDH-wt   01/23/2019 - 03/03/2019 Radiation Therapy   IMRT with concurrent Temozolomide 56m/m2   03/28/2019 - 09/04/2019 Chemotherapy   5 cycles of adjuvant 5-day Temozolomide 2087mm2    09/01/2019 Progression   Progression of disease #1   09/19/2019 -  Chemotherapy   Begins second line therapy with oral CCNU 9066m2 q6 weeks, concurrent Avastin 110m57m IV q2 weeks   04/30/2020 -  Chemotherapy   Completes 4th cycle of CCNU/Avastin, transitions to Avastin monotherapy due to cytopenias   08/27/2020 -  Chemotherapy   Resumes CCNU+Avastin after periventricular DWI signal change noted on MRI      Biomarkers:  MGMT Unknown.  IDH 1/2 Wild type.  EGFR Unknown  TERT Unknown   Interval History:  Jillian Gomez today for avastin infusion.  No further extremity injuries. Fatigue has been stable, unchanged. Otherwise no new or progressive neurologic complaints.  She does complain of poor appetite in recent weeks, doesn't eat as many solids.  Stable right sided vision, no clear change from prior.  No right sided weakness. No seizures or headaches.  H+P (01/09/19) Patient presented to medical attention in early December with ~2 months history of right sided visual impairment.  She describes fuzzy or blurry vision on that side which was progressive over time, leading to at least one fall.  MRI brain demonstrated enhancing left occipital mass;  this was subsequently resected by Dr. NundKathyrn Sheriff12/18/20.  She had no issues with surgery and has completed her steroid taper.  Continues on keppra daily but no history of any seizure. She has returned to work with only modest difficutly.  No issues walking or performing ADLs; lives alone but her sister and other family members are nearby.  Medications: Current Outpatient Medications on File Prior to Visit  Medication Sig Dispense Refill   amLODipine (NORVASC) 2.5 MG tablet Take 1 tablet (2.5 mg total) by mouth daily. 30 tablet 3   Biotin 1 MG CAPS Take 1 mg by mouth daily.     CALCIUM PO Take 1 tablet by mouth daily.     EUTHYROX 50 MCG tablet Take 50 mcg by mouth daily.     levothyroxine (SYNTHROID) 75 MCG tablet Take 75 mcg by mouth daily.     naproxen sodium (ALEVE) 220 MG tablet Take 1 tablet (220 mg total) by mouth daily as needed (pain).     omeprazole (PRILOSEC) 20 MG capsule Take 1 capsule (20 mg total) by mouth daily. 30 capsule 3   ondansetron (ZOFRAN) 8 MG tablet Take 1 tablet by mouth 2 (two) times daily as needed. Start on the third day after chemotherapy. 30 tablet 1   rosuvastatin (CRESTOR) 10 MG tablet Take 5 mg by mouth at bedtime.     doxycycline (VIBRA-TABS) 100 MG tablet Take 1 tablet by mouth daily. (Patient not taking: No sig reported)     No current facility-administered medications on file prior to visit.    Allergies:  Allergies  Allergen  Reactions   Etodolac Other (See Comments)    GI upset   Macrobid [Nitrofurantoin] Rash   Past Medical History:  Past Medical History:  Diagnosis Date   High cholesterol    Hypothyroidism    Joint pain    Thrombocytopenia ([place]) 02/26/2020   Past Surgical History:  Past Surgical History:  Procedure Laterality Date   APPLICATION OF CRANIAL NAVIGATION Left 12/23/2018   Procedure: APPLICATION OF CRANIAL NAVIGATION;  Surgeon: Consuella Lose, MD;  Location: [facility];  Service: Neurosurgery;  Laterality: Left;  APPLICATION  OF CRANIAL NAVIGATION   BREAST BIOPSY Left    COLONOSCOPY     CRANIOTOMY Left 12/23/2018   Procedure: STEREOTACTIC LEFT OCCIPITAL CRANIOTOMY FOR RESECTION OF TUMOR;  Surgeon: Consuella Lose, MD;  Location: [facility];  Service: Neurosurgery;  Laterality: Left;  STEREOTACTIC LEFT OCCIPITAL CRANIOTOMY FOR RESECTION OF TUMOR   LASIK     Social History:  Social History   Socioeconomic History   Marital status: Single    Spouse name: Not on file   Number of children: Not on file   Years of education: Not on file   Highest education level: Not on file  Occupational History   Not on file  Tobacco Use   Smoking status: Never   Smokeless tobacco: Never  Vaping Use   Vaping Use: Never used  Substance and Sexual Activity   Alcohol use: Not Currently   Drug use: Never   Sexual activity: Not on file  Other Topics Concern   Not on file  Social History Narrative   Not on file   Social Determinants of Health   Financial Resource Strain: Not on file  Food Insecurity: Not on file  Transportation Needs: Not on file  Physical Activity: Not on file  Stress: Not on file  Social Connections: Not on file  Intimate Partner Violence: Not on file   Family History: No family history on file.  Review of Systems: Constitutional: Denies fevers, chills or abnormal weight loss Eyes: Denies blurriness of vision Gastrointestinal:  Denies nausea, constipation, diarrhea GU: Denies dysuria or incontinence Skin: Denies abnormal skin rashes Musculoskeletal: Denies joint pain, back or neck discomfort.  Behavioral/Psych: Denies anxiety, mood instability  Physical Exam: Vitals:   11/19/20 1033  BP: 140/79  Pulse: 89  Resp: 18  Temp: (!) 97.1 F (36.2 C)  SpO2: 100%   KPS: 80. General: Alert, cooperative, pleasant, in no acute distress Head: Craniotomy scar noted, dry and intact. EENT: No conjunctival injection or scleral icterus. Oral mucosa moist Lungs: Resp effort normal Cardiac: Regular  rate and rhythm Abdomen: Soft, non-distended abdomen Skin: No rashes cyanosis or petechiae. Extremities: Left shin skin level cut, bleeding  Neurologic Exam: Mental Status: Awake, alert, attentive to examiner. Oriented to self and environment. Language is notable for mild transcortical expressive dyshpasia. Cranial Nerves: Visual acuity is grossly normal. Right homonymous hemianopia. Extra-ocular movements intact. No ptosis. Face is symmetric, tongue midline. Motor: Tone and bulk are normal. Power is full in both arms and legs. Reflexes are symmetric, no pathologic reflexes present. Intact finger to nose bilaterally Sensory: Intact to light touch and temperature Gait: Normal and tandem gait is deferred.   Labs: I have reviewed the data as listed    Component Value Date/Time   NA 140 11/19/2020 0956   K 4.4 11/19/2020 0956   CL 109 11/19/2020 0956   CO2 21 (L) 11/19/2020 0956   GLUCOSE 80 11/19/2020 0956   BUN 31 (H) 11/19/2020 4492  CREATININE 1.14 (H) 11/19/2020 0956   CALCIUM 9.0 11/19/2020 0956   PROT 6.4 (L) 11/19/2020 0956   ALBUMIN 3.6 11/19/2020 0956   AST 49 (H) 11/19/2020 0956   ALT 36 11/19/2020 0956   ALKPHOS 193 (H) 11/19/2020 0956   BILITOT 0.3 11/19/2020 0956   GFRNONAA 54 (L) 11/19/2020 0956   GFRAA >60 10/03/2019 1133   Lab Results  Component Value Date   WBC 13.1 (H) 11/19/2020   NEUTROABS 10.9 (H) 11/19/2020   HGB 10.4 (L) 11/19/2020   HCT 31.0 (L) 11/19/2020   MCV 98.4 11/19/2020   PLT 112 (L) 11/19/2020     Assessment/Plan Glioblastoma, IDH-1 WT  Jillian Gomez is clinically stable today.  No new or progressive deficits.  Labs today show overall resolution of myriad issues from 2 weeks prior.   CCNU cycle #6 was dosed on 11/03/20, currently day 17/42.  Ok to give Avastin 69m/kg IV today.  Chemotherapy should be held for the following:   ANC less than 1000  Platelets less than 100,000  LFT or creatinine greater than 2x ULN  If  clinical concerns/contraindications develop  Avastin should be held for the following:   ANC less than 500  Platelets less than 50,000  LFT or creatinine greater than 2x ULN  If clinical concerns/contraindications develop  May con't amlodipine 2.560mdaily.  We ask that Jillian LOREeturn to clinic in 2 weeks with labs for review prior to next planned avastin infusion  All questions were answered. The patient knows to call the clinic with any problems, questions or concerns. No barriers to learning were detected.  I have spent a total of 30 minutes of face-to-face and non-face-to-face time, excluding clinical staff time, preparing to see patient, ordering tests and/or medications, counseling the patient, and independently interpreting results and communicating results to the patient/family/caregiver    ZaVentura SellersMD Medical Director of Neuro-Oncology [facility]1/15/22 4:03 PM

## 2020-11-26 ENCOUNTER — Telehealth: Payer: Self-pay | Admitting: *Deleted

## 2020-11-26 NOTE — Telephone Encounter (Signed)
Patient called to report that her gums were bleeding this morning around 4 o'clock  in the morning unprovoked by anything.  She also reports episode of nose bleeding this morning.  This is the only time it has happened.  Reports no bleeding presently.  Report mild headache.  She was concerned if she needed to have blood work done to check her platelets.    Routed to Dr Mickeal Skinner.

## 2020-12-03 ENCOUNTER — Other Ambulatory Visit: Payer: Self-pay

## 2020-12-03 ENCOUNTER — Inpatient Hospital Stay (HOSPITAL_BASED_OUTPATIENT_CLINIC_OR_DEPARTMENT_OTHER): Payer: BC Managed Care – PPO | Admitting: Internal Medicine

## 2020-12-03 ENCOUNTER — Inpatient Hospital Stay: Payer: BC Managed Care – PPO

## 2020-12-03 VITALS — BP 144/88 | HR 82 | Temp 98.1°F | Wt 101.5 lb

## 2020-12-03 DIAGNOSIS — C714 Malignant neoplasm of occipital lobe: Secondary | ICD-10-CM | POA: Diagnosis not present

## 2020-12-03 DIAGNOSIS — R809 Proteinuria, unspecified: Secondary | ICD-10-CM | POA: Diagnosis not present

## 2020-12-03 DIAGNOSIS — Z5189 Encounter for other specified aftercare: Secondary | ICD-10-CM | POA: Diagnosis not present

## 2020-12-03 DIAGNOSIS — D696 Thrombocytopenia, unspecified: Secondary | ICD-10-CM | POA: Diagnosis not present

## 2020-12-03 DIAGNOSIS — C719 Malignant neoplasm of brain, unspecified: Secondary | ICD-10-CM | POA: Diagnosis not present

## 2020-12-03 DIAGNOSIS — Z5112 Encounter for antineoplastic immunotherapy: Secondary | ICD-10-CM | POA: Diagnosis not present

## 2020-12-03 DIAGNOSIS — E039 Hypothyroidism, unspecified: Secondary | ICD-10-CM | POA: Diagnosis not present

## 2020-12-03 DIAGNOSIS — Z923 Personal history of irradiation: Secondary | ICD-10-CM | POA: Diagnosis not present

## 2020-12-03 DIAGNOSIS — E7800 Pure hypercholesterolemia, unspecified: Secondary | ICD-10-CM | POA: Diagnosis not present

## 2020-12-03 DIAGNOSIS — Z79899 Other long term (current) drug therapy: Secondary | ICD-10-CM | POA: Diagnosis not present

## 2020-12-03 DIAGNOSIS — Z9221 Personal history of antineoplastic chemotherapy: Secondary | ICD-10-CM | POA: Diagnosis not present

## 2020-12-03 LAB — CBC WITH DIFFERENTIAL (CANCER CENTER ONLY)
Abs Immature Granulocytes: 0.01 10*3/uL (ref 0.00–0.07)
Basophils Absolute: 0 10*3/uL (ref 0.0–0.1)
Basophils Relative: 1 %
Eosinophils Absolute: 0.2 10*3/uL (ref 0.0–0.5)
Eosinophils Relative: 3 %
HCT: 27.3 % — ABNORMAL LOW (ref 36.0–46.0)
Hemoglobin: 9.1 g/dL — ABNORMAL LOW (ref 12.0–15.0)
Immature Granulocytes: 0 %
Lymphocytes Relative: 14 %
Lymphs Abs: 0.9 10*3/uL (ref 0.7–4.0)
MCH: 33 pg (ref 26.0–34.0)
MCHC: 33.3 g/dL (ref 30.0–36.0)
MCV: 98.9 fL (ref 80.0–100.0)
Monocytes Absolute: 0.5 10*3/uL (ref 0.1–1.0)
Monocytes Relative: 9 %
Neutro Abs: 4.6 10*3/uL (ref 1.7–7.7)
Neutrophils Relative %: 73 %
Platelet Count: 61 10*3/uL — ABNORMAL LOW (ref 150–400)
RBC: 2.76 MIL/uL — ABNORMAL LOW (ref 3.87–5.11)
RDW: 13.3 % (ref 11.5–15.5)
WBC Count: 6.3 10*3/uL (ref 4.0–10.5)
nRBC: 0 % (ref 0.0–0.2)

## 2020-12-03 LAB — CMP (CANCER CENTER ONLY)
ALT: 39 U/L (ref 0–44)
AST: 56 U/L — ABNORMAL HIGH (ref 15–41)
Albumin: 3.6 g/dL (ref 3.5–5.0)
Alkaline Phosphatase: 147 U/L — ABNORMAL HIGH (ref 38–126)
Anion gap: 6 (ref 5–15)
BUN: 29 mg/dL — ABNORMAL HIGH (ref 8–23)
CO2: 25 mmol/L (ref 22–32)
Calcium: 8.6 mg/dL — ABNORMAL LOW (ref 8.9–10.3)
Chloride: 109 mmol/L (ref 98–111)
Creatinine: 1.41 mg/dL — ABNORMAL HIGH (ref 0.44–1.00)
GFR, Estimated: 42 mL/min — ABNORMAL LOW (ref >60)
Glucose, Bld: 79 mg/dL (ref 70–99)
Potassium: 4.1 mmol/L (ref 3.5–5.1)
Sodium: 140 mmol/L (ref 135–145)
Total Bilirubin: 0.7 mg/dL (ref 0.3–1.2)
Total Protein: 5.9 g/dL — ABNORMAL LOW (ref 6.5–8.1)

## 2020-12-03 LAB — TOTAL PROTEIN, URINE DIPSTICK: Protein, ur: 300 mg/dL — AB

## 2020-12-03 NOTE — Progress Notes (Signed)
[facility] [address] [place], [place] [address] [phone]   Interval Evaluation  Date of Service: 12/03/20 Patient Name: Jillian Gomez Patient MRN: [id number] Patient DOB: Aug 02, 1955 Provider: Ventura Sellers, MD  Identifying Statement:  Jillian Gomez is a 65 y.o. female with left occipital glioblastoma   Oncologic History: Oncology History  Glioblastoma with isocitrate dehydrogenase gene wildtype ([place])  12/23/2018 Surgery   Craniotomy, left occipital resection by Dr. Kathyrn Sheriff.  Path is GBM IDH-wt   01/23/2019 - 03/03/2019 Radiation Therapy   IMRT with concurrent Temozolomide 66m/m2   03/28/2019 - 09/04/2019 Chemotherapy   5 cycles of adjuvant 5-day Temozolomide 2070mm2    09/01/2019 Progression   Progression of disease #1   09/19/2019 -  Chemotherapy   Begins second line therapy with oral CCNU 9047m2 q6 weeks, concurrent Avastin 61m33m IV q2 weeks   04/30/2020 -  Chemotherapy   Completes 4th cycle of CCNU/Avastin, transitions to Avastin monotherapy due to cytopenias   08/27/2020 -  Chemotherapy   Resumes CCNU+Avastin after periventricular DWI signal change noted on MRI      Biomarkers:  MGMT Unknown.  IDH 1/2 Wild type.  EGFR Unknown  TERT Unknown   Interval History:  RondLUCKY ALVERSONsents today for avastin infusion. She describes nose bleeds occurring more freqently, including one episode lasting for hours. Fatigue has been stable, unchanged. Otherwise no new or progressive neurologic complaints. No improvement in appetite.  Stable right sided vision, no clear change from prior.  No right sided weakness. No seizures or headaches.  H+P (01/09/19) Patient presented to medical attention in early December with ~2 months history of right sided visual impairment.  She describes fuzzy or blurry vision on that side which was progressive over time, leading to at least one fall.  MRI brain demonstrated enhancing left  occipital mass; this was subsequently resected by Dr. NundKathyrn Sheriff12/18/20.  She had no issues with surgery and has completed her steroid taper.  Continues on keppra daily but no history of any seizure. She has returned to work with only modest difficutly.  No issues walking or performing ADLs; lives alone but her sister and other family members are nearby.  Medications: Current Outpatient Medications on File Prior to Visit  Medication Sig Dispense Refill   amLODipine (NORVASC) 2.5 MG tablet Take 1 tablet (2.5 mg total) by mouth daily. 30 tablet 3   Biotin 1 MG CAPS Take 1 mg by mouth daily.     CALCIUM PO Take 1 tablet by mouth daily.     doxycycline (VIBRA-TABS) 100 MG tablet Take 1 tablet by mouth daily. (Patient not taking: No sig reported)     EUTHYROX 50 MCG tablet Take 50 mcg by mouth daily.     levothyroxine (SYNTHROID) 75 MCG tablet Take 75 mcg by mouth daily.     naproxen sodium (ALEVE) 220 MG tablet Take 1 tablet (220 mg total) by mouth daily as needed (pain).     omeprazole (PRILOSEC) 20 MG capsule Take 1 capsule (20 mg total) by mouth daily. 30 capsule 3   ondansetron (ZOFRAN) 8 MG tablet Take 1 tablet by mouth 2 (two) times daily as needed. Start on the third day after chemotherapy. 30 tablet 1   rosuvastatin (CRESTOR) 10 MG tablet Take 5 mg by mouth at bedtime.     No current facility-administered medications on file prior to visit.    Allergies:  Allergies  Allergen Reactions  Etodolac Other (See Comments)    GI upset   Macrobid [Nitrofurantoin] Rash   Past Medical History:  Past Medical History:  Diagnosis Date   High cholesterol    Hypothyroidism    Joint pain    Thrombocytopenia ([place]) 02/26/2020   Past Surgical History:  Past Surgical History:  Procedure Laterality Date   APPLICATION OF CRANIAL NAVIGATION Left 12/23/2018   Procedure: APPLICATION OF CRANIAL NAVIGATION;  Surgeon: Consuella Lose, MD;  Location: [facility];  Service: Neurosurgery;  Laterality:  Left;  APPLICATION OF CRANIAL NAVIGATION   BREAST BIOPSY Left    COLONOSCOPY     CRANIOTOMY Left 12/23/2018   Procedure: STEREOTACTIC LEFT OCCIPITAL CRANIOTOMY FOR RESECTION OF TUMOR;  Surgeon: Consuella Lose, MD;  Location: [facility];  Service: Neurosurgery;  Laterality: Left;  STEREOTACTIC LEFT OCCIPITAL CRANIOTOMY FOR RESECTION OF TUMOR   LASIK     Social History:  Social History   Socioeconomic History   Marital status: Single    Spouse name: Not on file   Number of children: Not on file   Years of education: Not on file   Highest education level: Not on file  Occupational History   Not on file  Tobacco Use   Smoking status: Never   Smokeless tobacco: Never  Vaping Use   Vaping Use: Never used  Substance and Sexual Activity   Alcohol use: Not Currently   Drug use: Never   Sexual activity: Not on file  Other Topics Concern   Not on file  Social History Narrative   Not on file   Social Determinants of Health   Financial Resource Strain: Not on file  Food Insecurity: Not on file  Transportation Needs: Not on file  Physical Activity: Not on file  Stress: Not on file  Social Connections: Not on file  Intimate Partner Violence: Not on file   Family History: No family history on file.  Review of Systems: Constitutional: Denies fevers, chills or abnormal weight loss Eyes: Denies blurriness of vision Gastrointestinal:  Denies nausea, constipation, diarrhea GU: Denies dysuria or incontinence Skin: Denies abnormal skin rashes Musculoskeletal: Denies joint pain, back or neck discomfort.  Behavioral/Psych: Denies anxiety, mood instability  Physical Exam: Vitals:   12/03/20 0939  BP: (!) 144/88  Pulse: 82  Temp: 98.1 F (36.7 C)  SpO2: 100%   KPS: 80. General: Alert, cooperative, pleasant, in no acute distress Head: Craniotomy scar noted, dry and intact. EENT: No conjunctival injection or scleral icterus. Oral mucosa moist Lungs: Resp effort normal Cardiac:  Regular rate and rhythm Abdomen: Soft, non-distended abdomen Skin: No rashes cyanosis or petechiae. Extremities: Left shin skin level cut, bleeding  Neurologic Exam: Mental Status: Awake, alert, attentive to examiner. Oriented to self and environment. Language is notable for mild transcortical expressive dyshpasia. Cranial Nerves: Visual acuity is grossly normal. Right homonymous hemianopia. Extra-ocular movements intact. No ptosis. Face is symmetric, tongue midline. Motor: Tone and bulk are normal. Power is full in both arms and legs. Reflexes are symmetric, no pathologic reflexes present. Intact finger to nose bilaterally Sensory: Intact to light touch and temperature Gait: Normal and tandem gait is deferred.   Labs: I have reviewed the data as listed    Component Value Date/Time   NA 140 11/19/2020 0956   K 4.4 11/19/2020 0956   CL 109 11/19/2020 0956   CO2 21 (L) 11/19/2020 0956   GLUCOSE 80 11/19/2020 0956   BUN 31 (H) 11/19/2020 0956   CREATININE 1.14 (H) 11/19/2020 6962  CALCIUM 9.0 11/19/2020 0956   PROT 6.4 (L) 11/19/2020 0956   ALBUMIN 3.6 11/19/2020 0956   AST 49 (H) 11/19/2020 0956   ALT 36 11/19/2020 0956   ALKPHOS 193 (H) 11/19/2020 0956   BILITOT 0.3 11/19/2020 0956   GFRNONAA 54 (L) 11/19/2020 0956   GFRAA >60 10/03/2019 1133   Lab Results  Component Value Date   WBC 13.1 (H) 11/19/2020   NEUTROABS 10.9 (H) 11/19/2020   HGB 10.4 (L) 11/19/2020   HCT 31.0 (L) 11/19/2020   MCV 98.4 11/19/2020   PLT 112 (L) 11/19/2020     Assessment/Plan Glioblastoma, IDH-1 WT  CORINDA AMMON is clinically stable today.  No new or progressive deficits.  Labs today demonstrate worsening thrombocytopenia.  CCNU cycle #6 was dosed on 11/03/20, currently day 17/42.  Will hold avastin today because of labs and bleeding episodes.  Chemotherapy should be held for the following:   ANC less than 1000  Platelets less than 100,000  LFT or creatinine greater than 2x  ULN  If clinical concerns/contraindications develop  Avastin should be held for the following:   ANC less than 500  Platelets less than 50,000  LFT or creatinine greater than 2x ULN  If clinical concerns/contraindications develop  May con't amlodipine 2.7m daily.  We ask that RSAARAH DEWINGreturn to clinic in 2 weeks with MRI brain for review prior to next cycle of CCNU/avastin.  All questions were answered. The patient knows to call the clinic with any problems, questions or concerns. No barriers to learning were detected.  I have spent a total of 30 minutes of face-to-face and non-face-to-face time, excluding clinical staff time, preparing to see patient, ordering tests and/or medications, counseling the patient, and independently interpreting results and communicating results to the patient/family/caregiver    ZVentura Sellers MD Medical Director of Neuro-Oncology [facility]11/29/22 9:30 AM

## 2020-12-06 ENCOUNTER — Other Ambulatory Visit (HOSPITAL_COMMUNITY): Payer: Self-pay

## 2020-12-09 ENCOUNTER — Other Ambulatory Visit (HOSPITAL_COMMUNITY): Payer: Self-pay

## 2020-12-10 ENCOUNTER — Other Ambulatory Visit: Payer: Self-pay | Admitting: Radiation Therapy

## 2020-12-13 ENCOUNTER — Ambulatory Visit (HOSPITAL_COMMUNITY)
Admission: RE | Admit: 2020-12-13 | Discharge: 2020-12-13 | Disposition: A | Discharge status: Home / Self Care | Payer: BC Managed Care – PPO | Source: Ambulatory Visit | Attending: Internal Medicine | Admitting: Internal Medicine

## 2020-12-13 ENCOUNTER — Other Ambulatory Visit: Payer: Self-pay

## 2020-12-13 DIAGNOSIS — Z9889 Other specified postprocedural states: Secondary | ICD-10-CM | POA: Diagnosis not present

## 2020-12-13 DIAGNOSIS — C719 Malignant neoplasm of brain, unspecified: Secondary | ICD-10-CM | POA: Diagnosis not present

## 2020-12-13 DIAGNOSIS — R220 Localized swelling, mass and lump, head: Secondary | ICD-10-CM | POA: Diagnosis not present

## 2020-12-13 IMAGING — MR MR HEAD WO/W CM
14 series · 48 of 48 positions shown · IV contrast (gadavist)
Comparison: [DATE] and earlier.

CLINICAL DATA: 65-year-old female glioblastoma.  Restaging.

EXAM:
MRI HEAD WITHOUT AND WITH CONTRAST
TECHNIQUE: Multiplanar, multiecho pulse sequences of the brain and surrounding
structures were obtained without and with intravenous contrast.
CONTRAST:  5mL GADAVIST GADOBUTROL 1 MMOL/ML IV SOLN

[Series 5: DWI · axial · 3.0mm · 1.36mm/px · z∈[-48,+103]mm · 7 of 104 slices shown (1 of 2)]
[im 1/104]
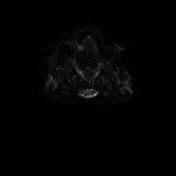
[im 18/104]
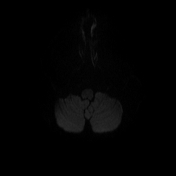
[im 35/104]
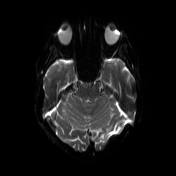
[im 52/104]
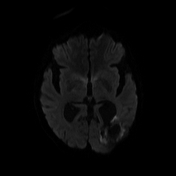
[im 69/104]
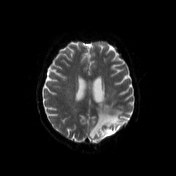
[im 86/104]
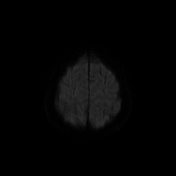
[im 104/104]
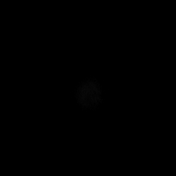

[Series 6: DWI · axial · 3.0mm · 1.36mm/px · z∈[-48,+103]mm · 4 of 52 slices shown (2 of 2)]
[im 1/52]
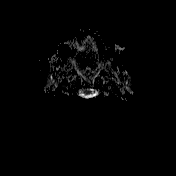
[im 18/52]
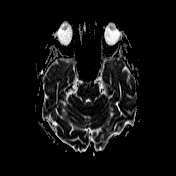
[im 35/52]
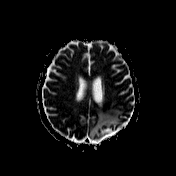
[im 52/52]
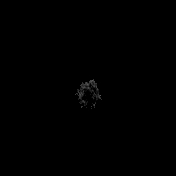

[Series 7: T1 · sagittal · 5.0mm · 0.75mm/px · 1 of 23 slices shown (1 of 2)]
[im 1/23]
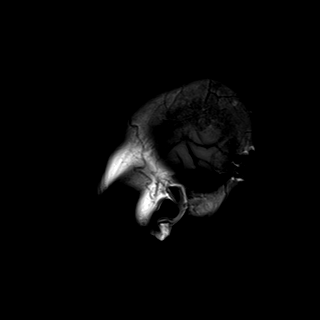

[Series 8: T2 · axial · 5.0mm · 0.62mm/px · 1 of 26 slices shown]
[im 1/26]
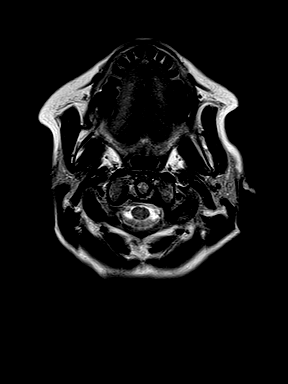

[Series 10: mip_images(sw) · axial · 24.0mm · 0.75mm/px · z∈[-36,+82]mm · 2 of 41 slices shown]
[im 1/41]
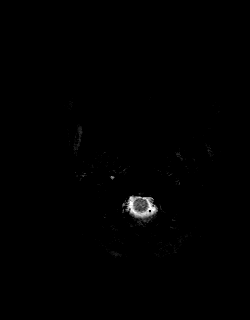
[im 41/41]
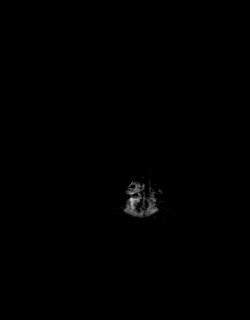

[Series 11: FLAIR · axial · 3.0mm · 0.75mm/px · z∈[-48,+103]mm · 3 of 52 slices shown]
[im 1/52]
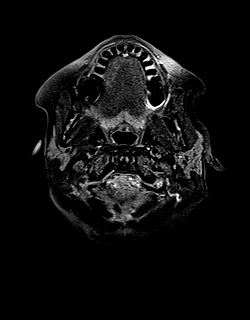
[im 26/52]
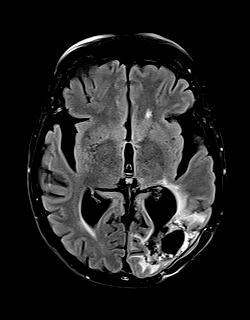
[im 52/52]
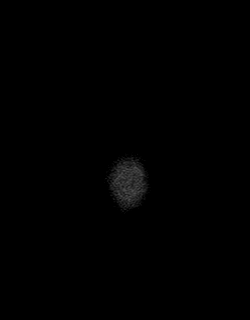

[Series 12: T1 · axial · 1.0mm · 0.94mm/px · z∈[-47,+109]mm · 9 of 160 slices shown (2 of 2)]
[im 1/160]
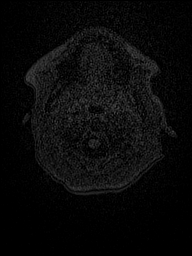
[im 20/160]
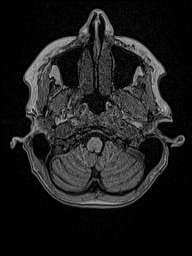
[im 40/160]
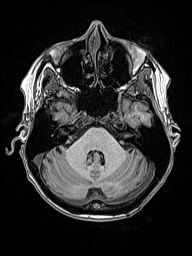
[im 60/160]
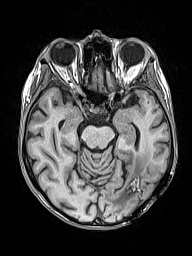
[im 80/160]
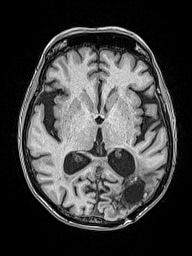
[im 100/160]
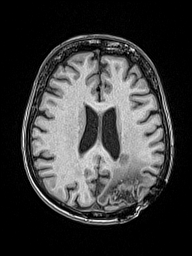
[im 120/160]
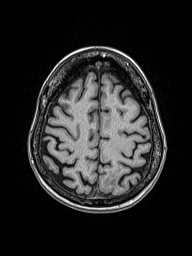
[im 140/160]
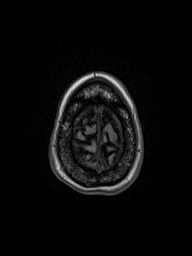
[im 160/160]
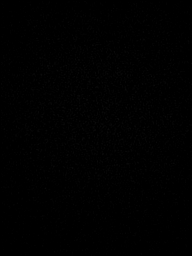

[Series 13: cor dwi_tracew · coronal · 5.0mm · 1.53mm/px · 3 of 52 slices shown]
[im 1/52]
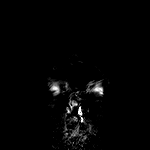
[im 26/52]
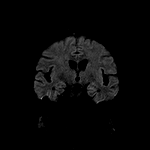
[im 52/52]
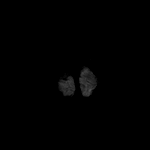

[Series 14: cor dwi_adc · coronal · 5.0mm · 1.53mm/px · 1 of 26 slices shown]
[im 1/26]
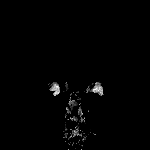

[Series 15: T2 post-contrast · coronal · 5.0mm · 0.57mm/px · 2 of 29 slices shown]
[im 1/29]
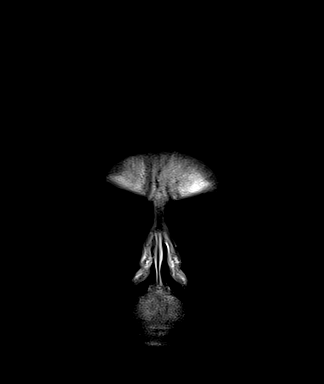
[im 29/29]
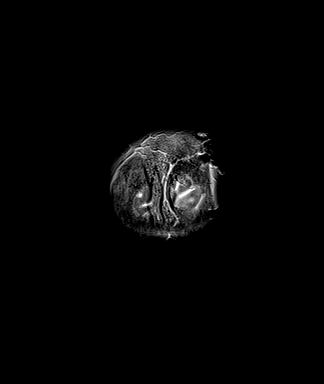

[Series 16: T1 post-contrast · axial · 1.0mm · 0.94mm/px · z∈[-47,+109]mm · 9 of 160 slices shown (1 of 3)]
[im 1/160]
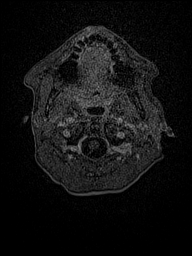
[im 20/160]
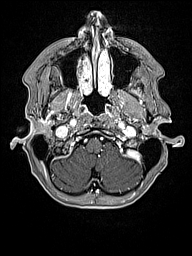
[im 40/160]
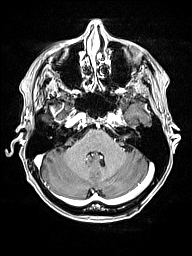
[im 60/160]
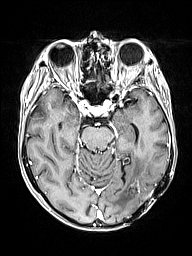
[im 80/160]
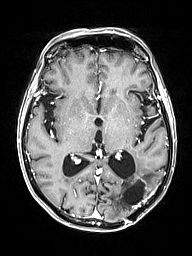
[im 100/160]
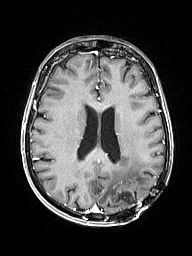
[im 120/160]
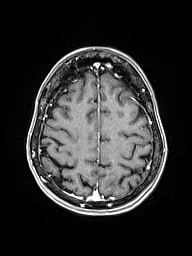
[im 140/160]
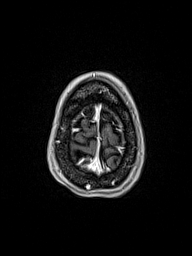
[im 160/160]
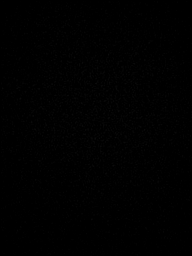

[Series 17: T1 post-contrast · coronal · 5.0mm · 0.43mm/px · 2 of 29 slices shown (2 of 3)]
[im 1/29]
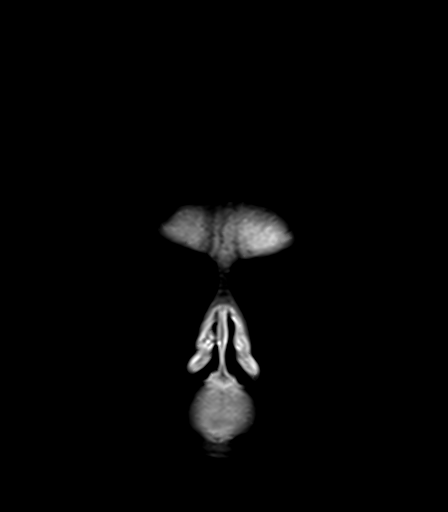
[im 29/29]
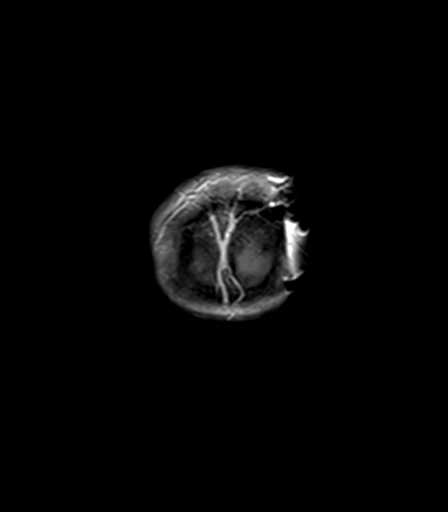

[Series 18: T1 post-contrast · sagittal · 5.0mm · 0.75mm/px · 1 of 23 slices shown (3 of 3)]
[im 1/23]
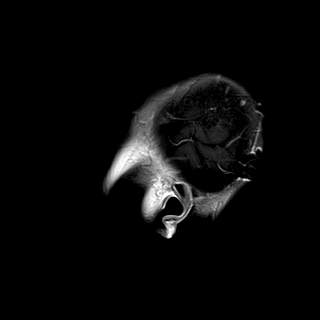

[Series 100: <mpr range> · axial · 1.0mm · 0.50mm/px · z∈[+59,+193]mm · 3 of 56 slices shown]
[im 1/56]
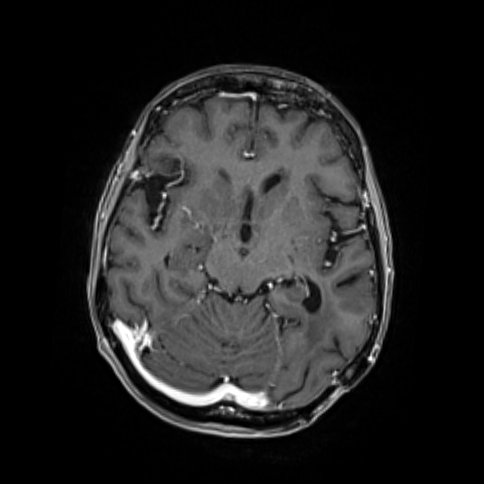
[im 28/56]
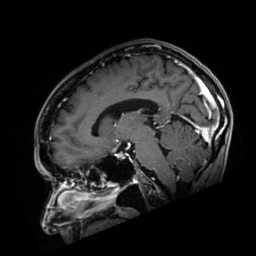
[im 56/56]
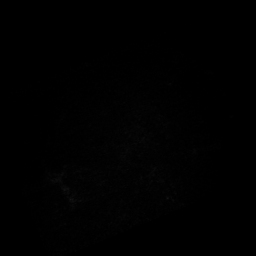

[48 of 48 positions shown; findings below may reference images not displayed]

FINDINGS: Brain: Treatment area in the posterior right hemisphere encompassing
the inferior parietal, much of the occipital, and also the posterior
temporal lobe. Nodular restricted diffusion along the margins of the
resection cavity, especially anteriorly, and in the nearby left
periatrial white matter continues to increase (series 5, image 85).
Regional T2 and FLAIR hyperintensity with mild associated gyral mass
effect has clearly increased from 1 year ago but has not
significantly changed since [REDACTED]. Heterogeneous hemosiderin in the
region is stable, with some intrinsic T1 hyperintensity. Following
contrast nodular enhancement has now progressed along the anterior
inferior resection site at the posterior temporal lobe (series 16,
image 66), measuring about 13 mm. Trace if any enhancement in the
periatrial white matter.

No midline shift or significant intracranial mass effect. No
ependymal enhancement. No other restricted diffusion to suggest
acute infarction. No ventriculomegaly, or acute intracranial
hemorrhage. Cervicomedullary junction and pituitary are within
normal limits. No new signal abnormality elsewhere. Stable mild
postoperative dural thickening underlying the craniotomy.

Vascular: Major intracranial vascular flow voids are stable. The
major dural venous sinuses are enhancing and appear to be patent.

Skull and upper cervical spine: Left posterior craniotomy.
Visualized bone marrow signal is within normal limits. Negative
visible cervical spine.

Sinuses/Orbits: Stable, negative.

Other: Mastoids remain well aerated. Visible internal auditory
structures appear normal. Negative visible scalp and face.
IMPRESSION: Constellation suspicious for true progression: increasing abnormal
diffusion along the resection cavity and in the left periatrial
white matter, small but increased focus of nodular enhancement at
the posterior temporal lobe, and regional T2/FLAIR hyperintensity
which has clearly progressed from one year ago.

## 2020-12-13 MED ORDER — GADOBUTROL 1 MMOL/ML IV SOLN
5.0000 mL | Freq: Once | INTRAVENOUS | Status: AC | PRN
  Administered 2020-12-13: 5 mL via INTRAVENOUS

## 2020-12-16 ENCOUNTER — Inpatient Hospital Stay: Payer: BC Managed Care – PPO | Attending: Internal Medicine

## 2020-12-16 DIAGNOSIS — Z923 Personal history of irradiation: Secondary | ICD-10-CM | POA: Insufficient documentation

## 2020-12-16 DIAGNOSIS — Z9221 Personal history of antineoplastic chemotherapy: Secondary | ICD-10-CM | POA: Insufficient documentation

## 2020-12-16 DIAGNOSIS — R5383 Other fatigue: Secondary | ICD-10-CM | POA: Insufficient documentation

## 2020-12-16 DIAGNOSIS — C714 Malignant neoplasm of occipital lobe: Secondary | ICD-10-CM | POA: Insufficient documentation

## 2020-12-16 DIAGNOSIS — E039 Hypothyroidism, unspecified: Secondary | ICD-10-CM | POA: Insufficient documentation

## 2020-12-16 DIAGNOSIS — Z79899 Other long term (current) drug therapy: Secondary | ICD-10-CM | POA: Insufficient documentation

## 2020-12-16 DIAGNOSIS — Z5189 Encounter for other specified aftercare: Secondary | ICD-10-CM | POA: Insufficient documentation

## 2020-12-16 DIAGNOSIS — Z5112 Encounter for antineoplastic immunotherapy: Secondary | ICD-10-CM | POA: Insufficient documentation

## 2020-12-17 ENCOUNTER — Other Ambulatory Visit (HOSPITAL_COMMUNITY): Payer: Self-pay

## 2020-12-17 ENCOUNTER — Inpatient Hospital Stay: Payer: BC Managed Care – PPO

## 2020-12-17 ENCOUNTER — Telehealth: Payer: Self-pay

## 2020-12-17 ENCOUNTER — Other Ambulatory Visit: Payer: Self-pay | Admitting: *Deleted

## 2020-12-17 ENCOUNTER — Telehealth: Payer: Self-pay | Admitting: Pharmacist

## 2020-12-17 ENCOUNTER — Inpatient Hospital Stay (HOSPITAL_BASED_OUTPATIENT_CLINIC_OR_DEPARTMENT_OTHER): Payer: BC Managed Care – PPO | Admitting: Internal Medicine

## 2020-12-17 ENCOUNTER — Other Ambulatory Visit: Payer: Self-pay

## 2020-12-17 VITALS — BP 160/81 | HR 91 | Temp 99.0°F | Resp 15 | Ht 62.0 in | Wt 101.5 lb

## 2020-12-17 DIAGNOSIS — C714 Malignant neoplasm of occipital lobe: Secondary | ICD-10-CM | POA: Diagnosis not present

## 2020-12-17 DIAGNOSIS — Z5112 Encounter for antineoplastic immunotherapy: Secondary | ICD-10-CM | POA: Diagnosis not present

## 2020-12-17 DIAGNOSIS — Z923 Personal history of irradiation: Secondary | ICD-10-CM | POA: Diagnosis not present

## 2020-12-17 DIAGNOSIS — Z79899 Other long term (current) drug therapy: Secondary | ICD-10-CM | POA: Diagnosis not present

## 2020-12-17 DIAGNOSIS — C719 Malignant neoplasm of brain, unspecified: Secondary | ICD-10-CM | POA: Diagnosis not present

## 2020-12-17 DIAGNOSIS — R5383 Other fatigue: Secondary | ICD-10-CM | POA: Diagnosis not present

## 2020-12-17 DIAGNOSIS — Z5189 Encounter for other specified aftercare: Secondary | ICD-10-CM | POA: Diagnosis not present

## 2020-12-17 DIAGNOSIS — D701 Agranulocytosis secondary to cancer chemotherapy: Secondary | ICD-10-CM

## 2020-12-17 DIAGNOSIS — Z9221 Personal history of antineoplastic chemotherapy: Secondary | ICD-10-CM | POA: Diagnosis not present

## 2020-12-17 DIAGNOSIS — E039 Hypothyroidism, unspecified: Secondary | ICD-10-CM | POA: Diagnosis not present

## 2020-12-17 DIAGNOSIS — D696 Thrombocytopenia, unspecified: Secondary | ICD-10-CM | POA: Diagnosis not present

## 2020-12-17 DIAGNOSIS — T451X5A Adverse effect of antineoplastic and immunosuppressive drugs, initial encounter: Secondary | ICD-10-CM

## 2020-12-17 LAB — CMP (CANCER CENTER ONLY)
ALT: 30 U/L (ref 0–44)
AST: 32 U/L (ref 15–41)
Albumin: 3.3 g/dL — ABNORMAL LOW (ref 3.5–5.0)
Alkaline Phosphatase: 135 U/L — ABNORMAL HIGH (ref 38–126)
Anion gap: 7 (ref 5–15)
BUN: 37 mg/dL — ABNORMAL HIGH (ref 8–23)
CO2: 23 mmol/L (ref 22–32)
Calcium: 8.7 mg/dL — ABNORMAL LOW (ref 8.9–10.3)
Chloride: 111 mmol/L (ref 98–111)
Creatinine: 1.44 mg/dL — ABNORMAL HIGH (ref 0.44–1.00)
GFR, Estimated: 41 mL/min — ABNORMAL LOW (ref >60)
Glucose, Bld: 90 mg/dL (ref 70–99)
Potassium: 3.7 mmol/L (ref 3.5–5.1)
Sodium: 141 mmol/L (ref 135–145)
Total Bilirubin: 0.7 mg/dL (ref 0.3–1.2)
Total Protein: 5.7 g/dL — ABNORMAL LOW (ref 6.5–8.1)

## 2020-12-17 LAB — CBC WITH DIFFERENTIAL (CANCER CENTER ONLY)
Abs Immature Granulocytes: 0.01 10*3/uL (ref 0.00–0.07)
Basophils Absolute: 0 10*3/uL (ref 0.0–0.1)
Basophils Relative: 0 %
Eosinophils Absolute: 0.2 10*3/uL (ref 0.0–0.5)
Eosinophils Relative: 4 %
HCT: 26.6 % — ABNORMAL LOW (ref 36.0–46.0)
Hemoglobin: 9.2 g/dL — ABNORMAL LOW (ref 12.0–15.0)
Immature Granulocytes: 0 %
Lymphocytes Relative: 15 %
Lymphs Abs: 0.8 10*3/uL (ref 0.7–4.0)
MCH: 33.9 pg (ref 26.0–34.0)
MCHC: 34.6 g/dL (ref 30.0–36.0)
MCV: 98.2 fL (ref 80.0–100.0)
Monocytes Absolute: 0.5 10*3/uL (ref 0.1–1.0)
Monocytes Relative: 10 %
Neutro Abs: 3.8 10*3/uL (ref 1.7–7.7)
Neutrophils Relative %: 71 %
Platelet Count: 62 10*3/uL — ABNORMAL LOW (ref 150–400)
RBC: 2.71 MIL/uL — ABNORMAL LOW (ref 3.87–5.11)
RDW: 13.2 % (ref 11.5–15.5)
WBC Count: 5.4 10*3/uL (ref 4.0–10.5)
nRBC: 0 % (ref 0.0–0.2)

## 2020-12-17 LAB — TOTAL PROTEIN, URINE DIPSTICK: Protein, ur: 300 mg/dL — AB

## 2020-12-17 MED ORDER — SODIUM CHLORIDE 0.9 % IV SOLN: Freq: Once | INTRAVENOUS | Status: AC

## 2020-12-17 MED ORDER — ONDANSETRON HCL 8 MG PO TABS
8.0000 mg | ORAL_TABLET | Freq: Two times a day (BID) | ORAL | 1 refills | Status: DC | PRN
  Filled 2020-12-17: qty 30, 15d supply, fill #0

## 2020-12-17 MED ORDER — PEGFILGRASTIM-CBQV 6 MG/0.6ML ~~LOC~~ SOSY
6.0000 mg | PREFILLED_SYRINGE | Freq: Once | SUBCUTANEOUS | Status: AC
  Administered 2020-12-17: 6 mg via SUBCUTANEOUS
  Filled 2020-12-17: qty 0.6

## 2020-12-17 MED ORDER — TEMOZOLOMIDE 5 MG PO CAPS
10.0000 mg | ORAL_CAPSULE | Freq: Every day | ORAL | 0 refills | Status: DC
  Filled 2020-12-17 – 2020-12-18 (×2): qty 56, 28d supply, fill #0

## 2020-12-17 MED ORDER — TEMOZOLOMIDE 20 MG PO CAPS
60.0000 mg | ORAL_CAPSULE | Freq: Every day | ORAL | 0 refills | Status: DC
  Filled 2020-12-17 – 2020-12-18 (×2): qty 84, 28d supply, fill #0

## 2020-12-17 MED ORDER — SODIUM CHLORIDE 0.9 % IV SOLN
10.0000 mg/kg | Freq: Once | INTRAVENOUS | Status: AC
  Administered 2020-12-17: 450 mg via INTRAVENOUS
  Filled 2020-12-17: qty 16

## 2020-12-17 NOTE — Telephone Encounter (Signed)
Oral Oncology Patient Advocate Encounter  After completing a benefits investigation, prior authorization for Temodar is not required at this time through [facility].  Patient's copay is $0.    [place] [facility] Phone [phone] Fax [phone] 12/17/2020 11:37 AM

## 2020-12-17 NOTE — Telephone Encounter (Signed)
Oral Oncology Pharmacist Encounter  Received new prescription for Temodar (temozolomide) for the treatment of glioblastoma, in conjunction with bevacizumab, planned duration until disease progression or unacceptable drug toxicity.  CBC w/ Diff and CMP from 12/17/20 assessed, noted pt with Hgb 9.2 g/dL, pltc 62 K/uL, Scr 1.44 (CrCl ~28 mL/min) No dose adjustments recommended for renal dysfunction in package insert. OK per MD for temodar initiation despite pltc of 62 K/uL. Prescription dose and frequency assessed for appropriateness. Appropriate for therapy initiation.   Current medication list in Epic reviewed, no relevant/significant DDIs with Temodar identified.  Evaluated chart and no patient barriers to medication adherence noted.   Patient agreement for treatment documented in MD note on 12/17/20.  Prescription has been e-scribed to the [facility] for benefits analysis and approval.  Oral Oncology Clinic will continue to follow for insurance authorization, copayment issues, initial counseling and start date.  Leron Croak, PharmD, BCPS Hematology/Oncology Clinical Pharmacist [facility] [phone] 12/17/2020 12:08 PM

## 2020-12-17 NOTE — Progress Notes (Signed)
[facility] [place] [place], [place] [address] [phone]   Interval Evaluation  Date of Service: 12/17/20 Patient Name: Jillian Gomez Patient MRN: [id number] Patient DOB: 30-Jul-1955 Provider: Ventura Sellers, MD  Identifying Statement:  Jillian Gomez is a 65 y.o. female with left occipital glioblastoma   Oncologic History: Oncology History  Glioblastoma with isocitrate dehydrogenase gene wildtype ([place])  12/23/2018 Surgery   Craniotomy, left occipital resection by Dr. Kathyrn Sheriff.  Path is GBM IDH-wt   01/23/2019 - 03/03/2019 Radiation Therapy   IMRT with concurrent Temozolomide 36m/m2   03/28/2019 - 09/04/2019 Chemotherapy   5 cycles of adjuvant 5-day Temozolomide 2045mm2    09/01/2019 Progression   Progression of disease #1   09/19/2019 -  Chemotherapy   Begins second line therapy with oral CCNU 9065m2 q6 weeks, concurrent Avastin 17m33m IV q2 weeks   04/30/2020 -  Chemotherapy   Completes 4th cycle of CCNU/Avastin, transitions to Avastin monotherapy due to cytopenias   08/27/2020 -  Chemotherapy   Resumes CCNU+Avastin after periventricular DWI signal change noted on MRI      Biomarkers:  MGMT Unknown.  IDH 1/2 Wild type.  EGFR Unknown  TERT Unknown   Interval History:  Jillian BARNETTEsents today for visit following recent MRI brain. No reported recurrence of nose bleeds. Fatigue has been stable, unchanged. Otherwise no new or progressive neurologic complaints. Appetite remains somewhat poor.  Stable right sided vision, no clear change from prior.  No right sided weakness. No seizures or headaches.  H+P (01/09/19) Patient presented to medical attention in early December with ~2 months history of right sided visual impairment.  She describes fuzzy or blurry vision on that side which was progressive over time, leading to at least one fall.  MRI brain demonstrated enhancing left occipital mass; this was subsequently  resected by Dr. NundKathyrn Sheriff12/18/20.  She had no issues with surgery and has completed her steroid taper.  Continues on keppra daily but no history of any seizure. She has returned to work with only modest difficutly.  No issues walking or performing ADLs; lives alone but her sister and other family members are nearby.  Medications: Current Outpatient Medications on File Prior to Visit  Medication Sig Dispense Refill   amLODipine (NORVASC) 2.5 MG tablet Take 1 tablet (2.5 mg total) by mouth daily. 30 tablet 3   Biotin 1 MG CAPS Take 1 mg by mouth daily.     CALCIUM PO Take 1 tablet by mouth daily.     EUTHYROX 50 MCG tablet Take 50 mcg by mouth daily.     levothyroxine (SYNTHROID) 75 MCG tablet Take 75 mcg by mouth daily.     naproxen sodium (ALEVE) 220 MG tablet Take 1 tablet (220 mg total) by mouth daily as needed (pain).     omeprazole (PRILOSEC) 20 MG capsule Take 1 capsule (20 mg total) by mouth daily. 30 capsule 3   rosuvastatin (CRESTOR) 10 MG tablet Take 5 mg by mouth at bedtime.     doxycycline (VIBRA-TABS) 100 MG tablet Take 1 tablet by mouth daily. (Patient not taking: Reported on 11/05/2020)     ondansetron (ZOFRAN) 8 MG tablet Take 1 tablet by mouth 2 (two) times daily as needed. Start on the third day after chemotherapy. (Patient not taking: Reported on 12/17/2020) 30 tablet 1   No current facility-administered medications on file prior to visit.    Allergies:  Allergies  Allergen Reactions  Etodolac Other (See Comments)    GI upset   Macrobid [Nitrofurantoin] Rash   Past Medical History:  Past Medical History:  Diagnosis Date   High cholesterol    Hypothyroidism    Joint pain    Thrombocytopenia ([place]) 02/26/2020   Past Surgical History:  Past Surgical History:  Procedure Laterality Date   APPLICATION OF CRANIAL NAVIGATION Left 12/23/2018   Procedure: APPLICATION OF CRANIAL NAVIGATION;  Surgeon: Consuella Lose, MD;  Location: [facility];  Service: Neurosurgery;   Laterality: Left;  APPLICATION OF CRANIAL NAVIGATION   BREAST BIOPSY Left    COLONOSCOPY     CRANIOTOMY Left 12/23/2018   Procedure: STEREOTACTIC LEFT OCCIPITAL CRANIOTOMY FOR RESECTION OF TUMOR;  Surgeon: Consuella Lose, MD;  Location: [facility];  Service: Neurosurgery;  Laterality: Left;  STEREOTACTIC LEFT OCCIPITAL CRANIOTOMY FOR RESECTION OF TUMOR   LASIK     Social History:  Social History   Socioeconomic History   Marital status: Single    Spouse name: Not on file   Number of children: Not on file   Years of education: Not on file   Highest education level: Not on file  Occupational History   Not on file  Tobacco Use   Smoking status: Never   Smokeless tobacco: Never  Vaping Use   Vaping Use: Never used  Substance and Sexual Activity   Alcohol use: Not Currently   Drug use: Never   Sexual activity: Not on file  Other Topics Concern   Not on file  Social History Narrative   Not on file   Social Determinants of Health   Financial Resource Strain: Not on file  Food Insecurity: Not on file  Transportation Needs: Not on file  Physical Activity: Not on file  Stress: Not on file  Social Connections: Not on file  Intimate Partner Violence: Not on file   Family History: No family history on file.  Review of Systems: Constitutional: Denies fevers, chills or abnormal weight loss Eyes: Denies blurriness of vision Gastrointestinal:  Denies nausea, constipation, diarrhea GU: Denies dysuria or incontinence Skin: Denies abnormal skin rashes Musculoskeletal: Denies joint pain, back or neck discomfort.  Behavioral/Psych: Denies anxiety, mood instability  Physical Exam: Vitals:   12/17/20 0946  BP: (!) 160/81  Pulse: 91  Resp: 15  Temp: 99 F (37.2 C)  SpO2: 100%   KPS: 80. General: Alert, cooperative, pleasant, in no acute distress Head: Craniotomy scar noted, dry and intact. EENT: No conjunctival injection or scleral icterus. Oral mucosa moist Lungs: Resp  effort normal Cardiac: Regular rate and rhythm Abdomen: Soft, non-distended abdomen Skin: No rashes cyanosis or petechiae. Extremities: Left shin skin level cut, bleeding  Neurologic Exam: Mental Status: Awake, alert, attentive to examiner. Oriented to self and environment. Language is notable for mild transcortical expressive dyshpasia. Cranial Nerves: Visual acuity is grossly normal. Right homonymous hemianopia. Extra-ocular movements intact. No ptosis. Face is symmetric, tongue midline. Motor: Tone and bulk are normal. Power is full in both arms and legs. Reflexes are symmetric, no pathologic reflexes present. Intact finger to nose bilaterally Sensory: Intact to light touch and temperature Gait: Normal and tandem gait is deferred.   Labs: I have reviewed the data as listed    Component Value Date/Time   NA 141 12/17/2020 0924   K 3.7 12/17/2020 0924   CL 111 12/17/2020 0924   CO2 23 12/17/2020 0924   GLUCOSE 90 12/17/2020 0924   BUN 37 (H) 12/17/2020 0924   CREATININE 1.44 (H) 12/17/2020  7482   CALCIUM 8.7 (L) 12/17/2020 0924   PROT 5.7 (L) 12/17/2020 0924   ALBUMIN 3.3 (L) 12/17/2020 0924   AST 32 12/17/2020 0924   ALT 30 12/17/2020 0924   ALKPHOS 135 (H) 12/17/2020 0924   BILITOT 0.7 12/17/2020 0924   GFRNONAA 41 (L) 12/17/2020 0924   GFRAA >60 10/03/2019 1133   Lab Results  Component Value Date   WBC 5.4 12/17/2020   NEUTROABS 3.8 12/17/2020   HGB 9.2 (L) 12/17/2020   HCT 26.6 (L) 12/17/2020   MCV 98.2 12/17/2020   PLT 62 (L) 12/17/2020    Imaging:  [facility] Clinician Interpretation: I have personally reviewed the CNS images as listed.  My interpretation, in the context of the patient's clinical presentation, is progressive disease  MR BRAIN W WO CONTRAST  Result Date: 12/14/2020 CLINICAL DATA:  65 year old female glioblastoma.  Restaging. EXAM: MRI HEAD WITHOUT AND WITH CONTRAST TECHNIQUE: Multiplanar, multiecho pulse sequences of the brain and surrounding  structures were obtained without and with intravenous contrast. CONTRAST:  74m GADAVIST GADOBUTROL 1 MMOL/ML IV SOLN COMPARISON:  08/23/2020 and earlier. FINDINGS: Brain: Treatment area in the posterior right hemisphere encompassing the inferior parietal, much of the occipital, and also the posterior temporal lobe. Nodular restricted diffusion along the margins of the resection cavity, especially anteriorly, and in the nearby left periatrial white matter continues to increase (series 5, image 85). Regional T2 and FLAIR hyperintensity with mild associated gyral mass effect has clearly increased from 1 year ago but has not significantly changed since August. Heterogeneous hemosiderin in the region is stable, with some intrinsic T1 hyperintensity. Following contrast nodular enhancement has now progressed along the anterior inferior resection site at the posterior temporal lobe (series 16, image 66), measuring about 13 mm. Trace if any enhancement in the periatrial white matter. No midline shift or significant intracranial mass effect. No ependymal enhancement. No other restricted diffusion to suggest acute infarction. No ventriculomegaly, or acute intracranial hemorrhage. Cervicomedullary junction and pituitary are within normal limits. No new signal abnormality elsewhere. Stable mild postoperative dural thickening underlying the craniotomy. Vascular: Major intracranial vascular flow voids are stable. The major dural venous sinuses are enhancing and appear to be patent. Skull and upper cervical spine: Left posterior craniotomy. Visualized bone marrow signal is within normal limits. Negative visible cervical spine. Sinuses/Orbits: Stable, negative. Other: Mastoids remain well aerated. Visible internal auditory structures appear normal. Negative visible scalp and face. IMPRESSION: Constellation suspicious for true progression: increasing abnormal diffusion along the resection cavity and in the left periatrial white  matter, small but increased focus of nodular enhancement at the posterior temporal lobe, and regional T2/FLAIR hyperintensity which has clearly progressed from one year ago. Electronically Signed   By: HGenevie AnnM.D.   On: 12/14/2020 04:11    Assessment/Plan Glioblastoma, IDH-1 WT  RTYRENE NADERis clinically stable today, now having completed 6 cycles of CCNU+avastin.  Avastin was held at prior visit 2 weeks ago due to lab and clinical indications.  MRI brain today demonstrates subtle progression of disease.  There are two foci of nodular progression, one is only visible on DWI adjacent to left lateral ventricle.  Neither represents more than 20% of total tumor volume.  These are asymptomatic changes, her functional status and cognition remain very good.  We discussed options for treatment plan change given likelihood of organic progression of disease.  Because of ongoing issues with thrombocytopenia, recommended trial of metronomic Temodar 562mm2 daily.  We will keep avastin on  board as well.  We are ok for her to dose the low dose Temodar despite her platelet count, given good clinical status, need for anti-tumor therapy.  Jillian Gomez will be administered today for additional bone marrow support.  We also discussed and patient consented for additional tumor profiling and sequencing through [facility].  Advanced tumor profiling could help identify actionable mutation for targeted therapy and lead to direct clinical benefit.     Informed consent was obtained verbally at bedside to proceed with oral chemotherapy.  Chemotherapy should be held for the following:  ANC less than 1,000  Platelets less than 100,000  LFT or creatinine greater than 2x ULN  If clinical concerns/contraindications develop  Avastin should be held for the following:   ANC less than 500  Platelets less than 50,000  LFT or creatinine greater than 2x ULN  If clinical concerns/contraindications develop  May con't  amlodipine 2.91m daily.  We ask that Jillian GOBLEreturn to clinic in 2 weeks with labs for review prior to continuation of metronomic TMZ and avastin.  All questions were answered. The patient knows to call the clinic with any problems, questions or concerns. No barriers to learning were detected.  I have spent a total of 40 minutes of face-to-face and non-face-to-face time, excluding clinical staff time, preparing to see patient, ordering tests and/or medications, counseling the patient, and independently interpreting results and communicating results to the patient/family/caregiver    ZVentura Sellers MD Medical Director of Neuro-Oncology [facility]12/13/22 11:12 AM

## 2020-12-17 NOTE — Patient Instructions (Signed)
[facility] MEDICAL ONCOLOGY  Discharge Instructions: °Thank you for choosing [facility] to provide your oncology and hematology care.  ° °If you have a lab appointment with the Cancer Center, please go directly to the Cancer Center and check in at the registration area. °  °Wear comfortable clothing and clothing appropriate for easy access to any Portacath or PICC line.  ° °We strive to give you quality time with your provider. You may need to reschedule your appointment if you arrive late (15 or more minutes).  Arriving late affects you and other patients whose appointments are after yours.  Also, if you miss three or more appointments without notifying the office, you may be dismissed from the clinic at the provider’s discretion.    °  °For prescription refill requests, have your pharmacy contact our office and allow 72 hours for refills to be completed.   ° °Today you received the following chemotherapy and/or immunotherapy agents: bevacizumab    °  °To help prevent nausea and vomiting after your treatment, we encourage you to take your nausea medication as directed. ° °BELOW ARE SYMPTOMS THAT SHOULD BE REPORTED IMMEDIATELY: °*FEVER GREATER THAN 100.4 F (38 °C) OR HIGHER °*CHILLS OR SWEATING °*NAUSEA AND VOMITING THAT IS NOT CONTROLLED WITH YOUR NAUSEA MEDICATION °*UNUSUAL SHORTNESS OF BREATH °*UNUSUAL BRUISING OR BLEEDING °*URINARY PROBLEMS (pain or burning when urinating, or frequent urination) °*BOWEL PROBLEMS (unusual diarrhea, constipation, pain near the anus) °TENDERNESS IN MOUTH AND THROAT WITH OR WITHOUT PRESENCE OF ULCERS (sore throat, sores in mouth, or a toothache) °UNUSUAL RASH, SWELLING OR PAIN  °UNUSUAL VAGINAL DISCHARGE OR ITCHING  ° °Items with * indicate a potential emergency and should be followed up as soon as possible or go to the Emergency Department if any problems should occur. ° °Please show the CHEMOTHERAPY ALERT CARD or IMMUNOTHERAPY ALERT CARD at check-in  to the Emergency Department and triage nurse. ° °Should you have questions after your visit or need to cancel or reschedule your appointment, please contact [facility] MEDICAL ONCOL[facility]  Dept: [phone]  and follow the prompts.  Office hours are 8:00 a.m. to 4:30 p.m. Monday - Friday. Please note that voicemails left after 4:00 p.m. may not be returned until the following business day.  We are closed weekends and major holidays. You have access to a nurse at all times for urgent questions. Please call the main number to the clinic Dept: [phone] and follow the prompts. ° ° °For any non-urgent questions, you may also contact your provider using MyChart. We now offer e-Visits for anyone 18 and older to request care online for non-urgent symptoms. For details visit [email or web address]. °  °Also download the MyChart app! Go to the app store, search "MyChart", open the app, select [facility], and log in with your MyChart username and password. ° °Due to Covid, a mask is required upon entering the hospital/clinic. If you do not have a mask, one will be given to you upon arrival. For doctor visits, patients may have 1 support person aged 18 or older with them. For treatment visits, patients cannot have anyone with them due to current Covid guidelines and our immunocompromised population.  ° °

## 2020-12-17 NOTE — Progress Notes (Signed)
Per Dr Mickeal Skinner ok to give patient Avastin today 12/17/2020.  He also wants her to have [place] today (even though she has NOT recently received Chemo).    Will also process [facility] genetic testing today.

## 2020-12-17 NOTE — Progress Notes (Signed)
DISCONTINUE ON PATHWAY REGIMEN - Neuro     A cycle is every 42 days:     Lomustine   **Always confirm dose/schedule in your pharmacy ordering system**  REASON: Disease Progression PRIOR TREATMENT: [id number]: Lomustine 110 mg/m2 D1 q42 Days TREATMENT RESPONSE: Progressive Disease (PD)  START ON PATHWAY REGIMEN - Neuro     A cycle is every 28 days:     Temozolomide      Temozolomide   **Always confirm dose/schedule in your pharmacy ordering system**  Patient Characteristics: Glioma, Glioblastoma, IDH-wildtype, Recurrent or Progressive, Nonsurgical Candidate, Systemic Therapy Candidate, BRAF V600E Mutation Negative/Unknown and NTRK Fusion Negative/Unknown Disease Classification: Glioma Disease Classification: Glioblastoma, IDH-wildtype Disease Status: Recurrent or Progressive Treatment Classification: Nonsurgical Candidate Treatment (Nonsurgical/Adjuvant): Systemic Therapy Candidate NTRK Gene Fusion Status: Awaiting Test Results BRAF V600E Mutation Status: Awaiting Test Results Intent of Therapy: Non-Curative / Palliative Intent, Discussed with Patient

## 2020-12-18 ENCOUNTER — Other Ambulatory Visit (HOSPITAL_COMMUNITY): Payer: Self-pay

## 2020-12-18 NOTE — Telephone Encounter (Signed)
Oral Chemotherapy Pharmacist Encounter  I spoke with patient for overview of: Temodar (temozolomide) for the treatment of glioblastoma, in conjunction with bevacizumab, planned duration until disease progression or unacceptable drug toxicity.  Counseled on administration, dosing, side effects, monitoring, drug-food interactions, safe handling, storage, and disposal.  Patient will take three Temodar 20mg  capsules and two Temodar 5mg  capsules, (70mg  total daily dose), by mouth once daily, may take at bedtime and on an empty stomach to decrease nausea and vomiting.  Patient expressed understanding that she will be taking this daily. Patient repeated back above instructions and also wrote them down.   Patient has Zofran 8mg  tablets to use as needed for N/V.   Temodar start date: 12/19/20 PM    Adverse effects include but are not limited to: nausea, vomiting, anorexia, GI upset, rash, drug fever, and fatigue. Rare but serious adverse effects of pneumocystis pneumonia and secondary malignancy also discussed.  We discussed strategies to manage constipation if they occur secondary to ondansetron dosing.  PCP prophylaxis will not be initiated at this time, but may be added based on lymphocyte count in the future.  Reviewed importance of keeping a medication schedule and plan for any missed doses. No barriers to medication adherence identified.  Medication reconciliation performed and medication/allergy list updated.  Insurance authorization for Temodar has been obtained. Test claim at the pharmacy revealed copayment $0 for 1st fill of Temodar. This will ship from the [facility] on 12/18/20 to deliver to patient's home on 12/19/20.  Patient informed the pharmacy will reach out 5-7 days prior to needing next fill of Temodar to coordinate continued medication acquisition to prevent break in therapy.  All questions answered.  Ms. Munford voiced understanding and appreciation.    Medication education handout placed in mail for patient. Patient knows to call the office with questions or concerns. Oral Chemotherapy Clinic phone number provided to patient.   Leron Croak, PharmD, BCPS Hematology/Oncology Clinical Pharmacist [facility] [phone] 12/18/2020 1:07 PM

## 2020-12-31 ENCOUNTER — Inpatient Hospital Stay: Payer: BC Managed Care – PPO | Admitting: Internal Medicine

## 2020-12-31 ENCOUNTER — Inpatient Hospital Stay: Payer: BC Managed Care – PPO

## 2021-01-01 ENCOUNTER — Other Ambulatory Visit (HOSPITAL_COMMUNITY): Payer: Self-pay

## 2021-01-01 ENCOUNTER — Telehealth: Payer: Self-pay | Admitting: Internal Medicine

## 2021-01-01 NOTE — Telephone Encounter (Signed)
Scheduled per sch msg. Called and left msg  

## 2021-01-03 ENCOUNTER — Telehealth: Payer: Self-pay | Admitting: Internal Medicine

## 2021-01-03 NOTE — Telephone Encounter (Signed)
Scheduled per 12/29 los, message has been left with pt

## 2021-01-07 ENCOUNTER — Inpatient Hospital Stay: Payer: BC Managed Care – PPO

## 2021-01-07 ENCOUNTER — Other Ambulatory Visit (HOSPITAL_COMMUNITY): Payer: Self-pay

## 2021-01-08 DIAGNOSIS — C718 Malignant neoplasm of overlapping sites of brain: Secondary | ICD-10-CM | POA: Diagnosis not present

## 2021-01-09 ENCOUNTER — Inpatient Hospital Stay (HOSPITAL_BASED_OUTPATIENT_CLINIC_OR_DEPARTMENT_OTHER): Payer: BC Managed Care – PPO | Admitting: Internal Medicine

## 2021-01-09 ENCOUNTER — Inpatient Hospital Stay: Payer: BC Managed Care – PPO

## 2021-01-09 ENCOUNTER — Inpatient Hospital Stay: Payer: BC Managed Care – PPO | Attending: Internal Medicine

## 2021-01-09 ENCOUNTER — Other Ambulatory Visit: Payer: Self-pay

## 2021-01-09 VITALS — BP 143/72 | HR 87 | Temp 97.9°F | Resp 18 | Ht 62.0 in | Wt 99.7 lb

## 2021-01-09 VITALS — BP 125/66

## 2021-01-09 DIAGNOSIS — Z5112 Encounter for antineoplastic immunotherapy: Secondary | ICD-10-CM | POA: Insufficient documentation

## 2021-01-09 DIAGNOSIS — Z9221 Personal history of antineoplastic chemotherapy: Secondary | ICD-10-CM | POA: Insufficient documentation

## 2021-01-09 DIAGNOSIS — C714 Malignant neoplasm of occipital lobe: Secondary | ICD-10-CM | POA: Insufficient documentation

## 2021-01-09 DIAGNOSIS — C719 Malignant neoplasm of brain, unspecified: Secondary | ICD-10-CM | POA: Diagnosis not present

## 2021-01-09 DIAGNOSIS — Z923 Personal history of irradiation: Secondary | ICD-10-CM | POA: Insufficient documentation

## 2021-01-09 DIAGNOSIS — Z79899 Other long term (current) drug therapy: Secondary | ICD-10-CM | POA: Insufficient documentation

## 2021-01-09 DIAGNOSIS — E7800 Pure hypercholesterolemia, unspecified: Secondary | ICD-10-CM | POA: Insufficient documentation

## 2021-01-09 DIAGNOSIS — E039 Hypothyroidism, unspecified: Secondary | ICD-10-CM | POA: Insufficient documentation

## 2021-01-09 LAB — CMP (CANCER CENTER ONLY)
ALT: 29 U/L (ref 0–44)
AST: 39 U/L (ref 15–41)
Albumin: 3.6 g/dL (ref 3.5–5.0)
Alkaline Phosphatase: 190 U/L — ABNORMAL HIGH (ref 38–126)
Anion gap: 8 (ref 5–15)
BUN: 31 mg/dL — ABNORMAL HIGH (ref 8–23)
CO2: 23 mmol/L (ref 22–32)
Calcium: 8.8 mg/dL — ABNORMAL LOW (ref 8.9–10.3)
Chloride: 107 mmol/L (ref 98–111)
Creatinine: 1.73 mg/dL — ABNORMAL HIGH (ref 0.44–1.00)
GFR, Estimated: 33 mL/min — ABNORMAL LOW (ref >60)
Glucose, Bld: 96 mg/dL (ref 70–99)
Potassium: 4.2 mmol/L (ref 3.5–5.1)
Sodium: 138 mmol/L (ref 135–145)
Total Bilirubin: 0.5 mg/dL (ref 0.3–1.2)
Total Protein: 6.2 g/dL — ABNORMAL LOW (ref 6.5–8.1)

## 2021-01-09 LAB — CBC WITH DIFFERENTIAL (CANCER CENTER ONLY)
Abs Immature Granulocytes: 0.02 10*3/uL (ref 0.00–0.07)
Basophils Absolute: 0.1 10*3/uL (ref 0.0–0.1)
Basophils Relative: 1 %
Eosinophils Absolute: 0.2 10*3/uL (ref 0.0–0.5)
Eosinophils Relative: 3 %
HCT: 27.3 % — ABNORMAL LOW (ref 36.0–46.0)
Hemoglobin: 9.2 g/dL — ABNORMAL LOW (ref 12.0–15.0)
Immature Granulocytes: 0 %
Lymphocytes Relative: 14 %
Lymphs Abs: 1.1 10*3/uL (ref 0.7–4.0)
MCH: 33.3 pg (ref 26.0–34.0)
MCHC: 33.7 g/dL (ref 30.0–36.0)
MCV: 98.9 fL (ref 80.0–100.0)
Monocytes Absolute: 0.7 10*3/uL (ref 0.1–1.0)
Monocytes Relative: 9 %
Neutro Abs: 5.7 10*3/uL (ref 1.7–7.7)
Neutrophils Relative %: 73 %
Platelet Count: 99 10*3/uL — ABNORMAL LOW (ref 150–400)
RBC: 2.76 MIL/uL — ABNORMAL LOW (ref 3.87–5.11)
RDW: 13.9 % (ref 11.5–15.5)
WBC Count: 7.7 10*3/uL (ref 4.0–10.5)
nRBC: 0 % (ref 0.0–0.2)

## 2021-01-09 LAB — TOTAL PROTEIN, URINE DIPSTICK: Protein, ur: 300 mg/dL — AB

## 2021-01-09 MED ORDER — SODIUM CHLORIDE 0.9 % IV SOLN: Freq: Once | INTRAVENOUS | Status: AC

## 2021-01-09 MED ORDER — SODIUM CHLORIDE 0.9 % IV SOLN
10.0000 mg/kg | Freq: Once | INTRAVENOUS | Status: AC
  Administered 2021-01-09: 450 mg via INTRAVENOUS
  Filled 2021-01-09: qty 16

## 2021-01-09 NOTE — Patient Instructions (Signed)
[facility] ONCOLOGY  Discharge Instructions: Thank you for choosing [facility] to provide your oncology and hematology care.   If you have a lab appointment with the [facility], please go directly to the [facility] and check in at the registration area.   Wear comfortable clothing and clothing appropriate for easy access to any Portacath or PICC line.   We strive to give you quality time with your provider. You may need to reschedule your appointment if you arrive late (15 or more minutes).  Arriving late affects you and other patients whose appointments are after yours.  Also, if you miss three or more appointments without notifying the office, you may be dismissed from the clinic at the providers discretion.      For prescription refill requests, have your pharmacy contact our office and allow 72 hours for refills to be completed.    Today you received the following chemotherapy and/or immunotherapy agents Bevacizumab-awwb      To help prevent nausea and vomiting after your treatment, we encourage you to take your nausea medication as directed.  BELOW ARE SYMPTOMS THAT SHOULD BE REPORTED IMMEDIATELY: *FEVER GREATER THAN 100.4 F (38 C) OR HIGHER *CHILLS OR SWEATING *NAUSEA AND VOMITING THAT IS NOT CONTROLLED WITH YOUR NAUSEA MEDICATION *UNUSUAL SHORTNESS OF BREATH *UNUSUAL BRUISING OR BLEEDING *URINARY PROBLEMS (pain or burning when urinating, or frequent urination) *BOWEL PROBLEMS (unusual diarrhea, constipation, pain near the anus) TENDERNESS IN MOUTH AND THROAT WITH OR WITHOUT PRESENCE OF ULCERS (sore throat, sores in mouth, or a toothache) UNUSUAL RASH, SWELLING OR PAIN  UNUSUAL VAGINAL DISCHARGE OR ITCHING   Items with * indicate a potential emergency and should be followed up as soon as possible or go to the Emergency Department if any problems should occur.  Please show the CHEMOTHERAPY ALERT CARD or IMMUNOTHERAPY ALERT CARD at  check-in to the Emergency Department and triage nurse.  Should you have questions after your visit or need to cancel or reschedule your appointment, please contact [facility]  Dept: [phone]  and follow the prompts.  Office hours are 8:00 a.m. to 4:30 p.m. Monday - Friday. Please note that voicemails left after 4:00 p.m. may not be returned until the following business day.  We are closed weekends and major holidays. You have access to a nurse at all times for urgent questions. Please call the main number to the clinic Dept: [phone] and follow the prompts.   For any non-urgent questions, you may also contact your provider using MyChart. We now offer e-Visits for anyone 54 and older to request care online for non-urgent symptoms. For details visit [email or web address].   Also download the MyChart app! Go to the app store, search "MyChart", open the app, select , and log in with your MyChart username and password.  Due to Covid, a mask is required upon entering the hospital/clinic. If you do not have a mask, one will be given to you upon arrival. For doctor visits, patients may have 1 support person aged 32 or older with them. For treatment visits, patients cannot have anyone with them due to current Covid guidelines and our immunocompromised population.

## 2021-01-09 NOTE — Progress Notes (Signed)
[facility] at [place], [place] [address] [phone]   Interval Evaluation  Date of Service: 01/09/21 Patient Name: Jillian Gomez Patient MRN: [id number] Patient DOB: [birth year] Provider: Ventura Sellers, MD  Identifying Statement:  Jillian Gomez is a 66 y.o. female with left occipital glioblastoma   Oncologic History: Oncology History  Glioblastoma with isocitrate dehydrogenase gene wildtype ([place])  12/23/2018 Surgery   Craniotomy, left occipital resection by Dr. Kathyrn Sheriff.  Path is GBM IDH-wt   01/23/2019 - 03/03/2019 Radiation Therapy   IMRT with concurrent Temozolomide 31m/m2   03/28/2019 - 09/04/2019 Chemotherapy   5 cycles of adjuvant 5-day Temozolomide 2027mm2    09/01/2019 Progression   Progression of disease #1   09/19/2019 -  Chemotherapy   Begins second line therapy with oral CCNU 9041m2 q6 weeks, concurrent Avastin 109m89m IV q2 weeks   04/30/2020 -  Chemotherapy   Completes 4th cycle of CCNU/Avastin, transitions to Avastin monotherapy due to cytopenias   08/27/2020 -  Chemotherapy   Resumes CCNU+Avastin after periventricular DWI signal change noted on MRI    12/19/2020 -  Chemotherapy   Patient is on Treatment Plan : BRAIN GLIOBLASTOMA Consolidation Temozolomide Days 1-5 q28 Days        Biomarkers:  MGMT Unknown.  IDH 1/2 Wild type.  EGFR Unknown  TERT Unknown   Interval History:  Jillian Gomez today for visit and avastin infusion. No new or progressive complaints today.  Tolerating the daily Temodar well.  Fatigue has been stable, unchanged. Otherwise no new or progressive neurologic complaints. Appetite remains somewhat poor.  Stable right sided vision, no clear change from prior.  No right sided weakness. No seizures or headaches.  H+P (01/09/19) Patient presented to medical attention in early December with ~2 months history of right sided visual impairment.  She describes fuzzy or blurry  vision on that side which was progressive over time, leading to at least one fall.  MRI brain demonstrated enhancing left occipital mass; this was subsequently resected by Dr. NundKathyrn Sheriff12/18/20.  She had no issues with surgery and has completed her steroid taper.  Continues on keppra daily but no history of any seizure. She has returned to work with only modest difficutly.  No issues walking or performing ADLs; lives alone but her sister and other family members are nearby.  Medications: Current Outpatient Medications on File Prior to Visit  Medication Sig Dispense Refill   amLODipine (NORVASC) 2.5 MG tablet Take 1 tablet (2.5 mg total) by mouth daily. 30 tablet 3   Biotin 1 MG CAPS Take 1 mg by mouth daily.     CALCIUM PO Take 1 tablet by mouth daily.     levothyroxine (SYNTHROID) 75 MCG tablet Take 75 mcg by mouth daily.     naproxen sodium (ALEVE) 220 MG tablet Take 1 tablet (220 mg total) by mouth daily as needed (pain).     omeprazole (PRILOSEC) 20 MG capsule Take 1 capsule (20 mg total) by mouth daily. 30 capsule 3   rosuvastatin (CRESTOR) 10 MG tablet Take 5 mg by mouth at bedtime.     temozolomide (TEMODAR) 20 MG capsule Take 3 capsules (60 mg total) by mouth daily. May take on an empty stomach to decrease nausea & vomiting. 84 capsule 0   temozolomide (TEMODAR) 5 MG capsule Take 2 capsules (10 mg total) by mouth daily. May take on an empty stomach to decrease nausea & vomiting. 56  capsule 0   doxycycline (VIBRA-TABS) 100 MG tablet Take 1 tablet by mouth daily. (Patient not taking: Reported on 11/05/2020)     ondansetron (ZOFRAN) 8 MG tablet Take 1 tablet (8 mg total) by mouth 2 (two) times daily as needed (nausea and vomiting). May take 30-60 minutes prior to Temodar administration if nausea/vomiting occurs. (Patient not taking: Reported on 01/09/2021) 30 tablet 1   No current facility-administered medications on file prior to visit.    Allergies:  Allergies  Allergen Reactions    Etodolac Other (See Comments)    GI upset   Macrobid [Nitrofurantoin] Rash   Past Medical History:  Past Medical History:  Diagnosis Date   High cholesterol    Hypothyroidism    Joint pain    Thrombocytopenia ([place]) 02/26/2020   Past Surgical History:  Past Surgical History:  Procedure Laterality Date   APPLICATION OF CRANIAL NAVIGATION Left 12/23/2018   Procedure: APPLICATION OF CRANIAL NAVIGATION;  Surgeon: Consuella Lose, MD;  Location: [facility];  Service: Neurosurgery;  Laterality: Left;  APPLICATION OF CRANIAL NAVIGATION   BREAST BIOPSY Left    COLONOSCOPY     CRANIOTOMY Left 12/23/2018   Procedure: STEREOTACTIC LEFT OCCIPITAL CRANIOTOMY FOR RESECTION OF TUMOR;  Surgeon: Consuella Lose, MD;  Location: [facility];  Service: Neurosurgery;  Laterality: Left;  STEREOTACTIC LEFT OCCIPITAL CRANIOTOMY FOR RESECTION OF TUMOR   LASIK     Social History:  Social History   Socioeconomic History   Marital status: Single    Spouse name: Not on file   Number of children: Not on file   Years of education: Not on file   Highest education level: Not on file  Occupational History   Not on file  Tobacco Use   Smoking status: Never   Smokeless tobacco: Never  Vaping Use   Vaping Use: Never used  Substance and Sexual Activity   Alcohol use: Not Currently   Drug use: Never   Sexual activity: Not on file  Other Topics Concern   Not on file  Social History Narrative   Not on file   Social Determinants of Health   Financial Resource Strain: Not on file  Food Insecurity: Not on file  Transportation Needs: Not on file  Physical Activity: Not on file  Stress: Not on file  Social Connections: Not on file  Intimate Partner Violence: Not on file   Family History: No family history on file.  Review of Systems: Constitutional: Denies fevers, chills or abnormal weight loss Eyes: Denies blurriness of vision Gastrointestinal:  Denies nausea, constipation, diarrhea GU: Denies dysuria or  incontinence Skin: Denies abnormal skin rashes Musculoskeletal: Denies joint pain, back or neck discomfort.  Behavioral/Psych: Denies anxiety, mood instability  Physical Exam: Vitals:   01/09/21 1428 01/09/21 1439  BP: (!) 143/72 (!) 143/72  Pulse: 87 87  Resp: 18 18  Temp: 97.9 F (36.6 C) 97.9 F (36.6 C)  SpO2: 100% 100%   KPS: 80. General: Alert, cooperative, pleasant, in no acute distress Head: Craniotomy scar noted, dry and intact. EENT: No conjunctival injection or scleral icterus. Oral mucosa moist Lungs: Resp effort normal Cardiac: Regular rate and rhythm Abdomen: Soft, non-distended abdomen Skin: No rashes cyanosis or petechiae. Extremities: Left shin skin level cut, bleeding  Neurologic Exam: Mental Status: Awake, alert, attentive to examiner. Oriented to self and environment. Language is notable for mild transcortical expressive dyshpasia. Cranial Nerves: Visual acuity is grossly normal. Right homonymous hemianopia. Extra-ocular movements intact. No ptosis. Face is symmetric, tongue  midline. Motor: Tone and bulk are normal. Power is full in both arms and legs. Reflexes are symmetric, no pathologic reflexes present. Intact finger to nose bilaterally Sensory: Intact to light touch and temperature Gait: Normal and tandem gait is deferred.   Labs: I have reviewed the data as listed    Component Value Date/Time   NA 138 01/09/2021 1412   K 4.2 01/09/2021 1412   CL 107 01/09/2021 1412   CO2 23 01/09/2021 1412   GLUCOSE 96 01/09/2021 1412   BUN 31 (H) 01/09/2021 1412   CREATININE 1.73 (H) 01/09/2021 1412   CALCIUM 8.8 (L) 01/09/2021 1412   PROT 6.2 (L) 01/09/2021 1412   ALBUMIN 3.6 01/09/2021 1412   AST 39 01/09/2021 1412   ALT 29 01/09/2021 1412   ALKPHOS 190 (H) 01/09/2021 1412   BILITOT 0.5 01/09/2021 1412   GFRNONAA 33 (L) 01/09/2021 1412   GFRAA >60 10/03/2019 1133   Lab Results  Component Value Date   WBC 7.7 01/09/2021   NEUTROABS 5.7 01/09/2021    HGB 9.2 (L) 01/09/2021   HCT 27.3 (L) 01/09/2021   MCV 98.9 01/09/2021   PLT 99 (L) 01/09/2021    Assessment/Plan Glioblastoma, IDH-1 WT  Jillian Gomez is clinically stable today, now on day 15/28 of cycle #1 metronomic Temodar+avastin.    We recommended continuation of cycle #1 metronomic Temodar 17m/m2 daily.  We will keep avastin on board as well q2 weeks.    Chemotherapy should be held for the following:  ANC less than 1,000  Platelets less than 100,000  LFT or creatinine greater than 2x ULN  If clinical concerns/contraindications develop  Avastin should be held for the following:   ANC less than 500  Platelets less than 50,000  LFT or creatinine greater than 2x ULN  If clinical concerns/contraindications develop  May con't amlodipine 2.549mdaily.  We ask that RoLUVADA SALAMONEeturn to clinic in 2 weeks with labs for review prior to cycle #2 of of metronomic TMZ and avastin.  All questions were answered. The patient knows to call the clinic with any problems, questions or concerns. No barriers to learning were detected.  I have spent a total of 30 minutes of face-to-face and non-face-to-face time, excluding clinical staff time, preparing to see patient, ordering tests and/or medications, counseling the patient, and independently interpreting results and communicating results to the patient/family/caregiver    ZaVentura SellersMD Medical Director of Neuro-Oncology CoThe [facility] We[facility]1/05/23 2:55 PM

## 2021-01-13 ENCOUNTER — Other Ambulatory Visit (HOSPITAL_COMMUNITY): Payer: Self-pay

## 2021-01-13 ENCOUNTER — Other Ambulatory Visit: Payer: Self-pay | Admitting: *Deleted

## 2021-01-13 ENCOUNTER — Other Ambulatory Visit: Payer: Self-pay | Admitting: Internal Medicine

## 2021-01-13 DIAGNOSIS — C719 Malignant neoplasm of brain, unspecified: Secondary | ICD-10-CM

## 2021-01-14 ENCOUNTER — Encounter: Payer: Self-pay | Admitting: *Deleted

## 2021-01-14 ENCOUNTER — Other Ambulatory Visit (HOSPITAL_COMMUNITY): Payer: Self-pay

## 2021-01-14 ENCOUNTER — Other Ambulatory Visit: Payer: Self-pay | Admitting: Internal Medicine

## 2021-01-14 DIAGNOSIS — C719 Malignant neoplasm of brain, unspecified: Secondary | ICD-10-CM

## 2021-01-14 MED FILL — Temozolomide Cap 20 MG: ORAL | 28 days supply | Qty: 84 | Fill #0 | Status: AC

## 2021-01-14 NOTE — Progress Notes (Signed)
[facility]  Clinical Social Work was referred by neuro-oncology for assessment of psychosocial needs.  Clinical Social Worker contacted caregiver by phone  to offer support and assess for needs.    Ms. Moxley shared she is considering retiring from work and plans to sign up for [employer] benefits since turning 31.  CSW and patient discussed process for applying for retirement. Patient has established relationship with an Research scientist (life sciences) in her area who will help her apply for Medicare benefits.  CSW encouraged patient to call with questions or to process psychosocial needs.    Gwinda Maine, LCSW  Clinical Social Worker [facility]

## 2021-01-15 ENCOUNTER — Other Ambulatory Visit (HOSPITAL_COMMUNITY): Payer: Self-pay

## 2021-01-16 ENCOUNTER — Other Ambulatory Visit: Payer: Self-pay | Admitting: Internal Medicine

## 2021-01-16 ENCOUNTER — Other Ambulatory Visit (HOSPITAL_COMMUNITY): Payer: Self-pay

## 2021-01-16 DIAGNOSIS — C719 Malignant neoplasm of brain, unspecified: Secondary | ICD-10-CM

## 2021-01-16 MED ORDER — TEMOZOLOMIDE 5 MG PO CAPS
10.0000 mg | ORAL_CAPSULE | Freq: Every day | ORAL | 0 refills | Status: DC
  Filled 2021-01-16: qty 56, 28d supply, fill #0

## 2021-01-16 NOTE — Telephone Encounter (Signed)
Pls review.

## 2021-01-17 ENCOUNTER — Other Ambulatory Visit (HOSPITAL_COMMUNITY): Payer: Self-pay

## 2021-01-18 DIAGNOSIS — C718 Malignant neoplasm of overlapping sites of brain: Secondary | ICD-10-CM | POA: Diagnosis not present

## 2021-01-21 ENCOUNTER — Encounter (HOSPITAL_COMMUNITY): Payer: Self-pay

## 2021-01-23 ENCOUNTER — Inpatient Hospital Stay: Payer: BC Managed Care – PPO

## 2021-01-23 ENCOUNTER — Other Ambulatory Visit: Payer: Self-pay

## 2021-01-23 ENCOUNTER — Telehealth: Payer: Self-pay | Admitting: *Deleted

## 2021-01-23 ENCOUNTER — Inpatient Hospital Stay (HOSPITAL_BASED_OUTPATIENT_CLINIC_OR_DEPARTMENT_OTHER): Payer: BC Managed Care – PPO | Admitting: Internal Medicine

## 2021-01-23 VITALS — BP 138/82 | HR 99 | Temp 97.7°F | Resp 18 | Wt 96.2 lb

## 2021-01-23 DIAGNOSIS — Z923 Personal history of irradiation: Secondary | ICD-10-CM | POA: Diagnosis not present

## 2021-01-23 DIAGNOSIS — E039 Hypothyroidism, unspecified: Secondary | ICD-10-CM | POA: Diagnosis not present

## 2021-01-23 DIAGNOSIS — C714 Malignant neoplasm of occipital lobe: Secondary | ICD-10-CM | POA: Diagnosis not present

## 2021-01-23 DIAGNOSIS — E7800 Pure hypercholesterolemia, unspecified: Secondary | ICD-10-CM | POA: Diagnosis not present

## 2021-01-23 DIAGNOSIS — C719 Malignant neoplasm of brain, unspecified: Secondary | ICD-10-CM | POA: Diagnosis not present

## 2021-01-23 DIAGNOSIS — Z5112 Encounter for antineoplastic immunotherapy: Secondary | ICD-10-CM | POA: Diagnosis not present

## 2021-01-23 DIAGNOSIS — Z79899 Other long term (current) drug therapy: Secondary | ICD-10-CM | POA: Diagnosis not present

## 2021-01-23 DIAGNOSIS — Z9221 Personal history of antineoplastic chemotherapy: Secondary | ICD-10-CM | POA: Diagnosis not present

## 2021-01-23 LAB — CBC WITH DIFFERENTIAL (CANCER CENTER ONLY)
Abs Immature Granulocytes: 0.02 10*3/uL (ref 0.00–0.07)
Basophils Absolute: 0 10*3/uL (ref 0.0–0.1)
Basophils Relative: 1 %
Eosinophils Absolute: 0.2 10*3/uL (ref 0.0–0.5)
Eosinophils Relative: 2 %
HCT: 28.5 % — ABNORMAL LOW (ref 36.0–46.0)
Hemoglobin: 9.9 g/dL — ABNORMAL LOW (ref 12.0–15.0)
Immature Granulocytes: 0 %
Lymphocytes Relative: 12 %
Lymphs Abs: 1 10*3/uL (ref 0.7–4.0)
MCH: 34.5 pg — ABNORMAL HIGH (ref 26.0–34.0)
MCHC: 34.7 g/dL (ref 30.0–36.0)
MCV: 99.3 fL (ref 80.0–100.0)
Monocytes Absolute: 0.6 10*3/uL (ref 0.1–1.0)
Monocytes Relative: 7 %
Neutro Abs: 6.9 10*3/uL (ref 1.7–7.7)
Neutrophils Relative %: 78 %
Platelet Count: 113 10*3/uL — ABNORMAL LOW (ref 150–400)
RBC: 2.87 MIL/uL — ABNORMAL LOW (ref 3.87–5.11)
RDW: 13 % (ref 11.5–15.5)
WBC Count: 8.8 10*3/uL (ref 4.0–10.5)
nRBC: 0 % (ref 0.0–0.2)

## 2021-01-23 LAB — CMP (CANCER CENTER ONLY)
ALT: 25 U/L (ref 0–44)
AST: 37 U/L (ref 15–41)
Albumin: 3.7 g/dL (ref 3.5–5.0)
Alkaline Phosphatase: 179 U/L — ABNORMAL HIGH (ref 38–126)
Anion gap: 8 (ref 5–15)
BUN: 48 mg/dL — ABNORMAL HIGH (ref 8–23)
CO2: 23 mmol/L (ref 22–32)
Calcium: 8.5 mg/dL — ABNORMAL LOW (ref 8.9–10.3)
Chloride: 105 mmol/L (ref 98–111)
Creatinine: 1.73 mg/dL — ABNORMAL HIGH (ref 0.44–1.00)
GFR, Estimated: 32 mL/min — ABNORMAL LOW (ref >60)
Glucose, Bld: 114 mg/dL — ABNORMAL HIGH (ref 70–99)
Potassium: 4.2 mmol/L (ref 3.5–5.1)
Sodium: 136 mmol/L (ref 135–145)
Total Bilirubin: 0.5 mg/dL (ref 0.3–1.2)
Total Protein: 6.2 g/dL — ABNORMAL LOW (ref 6.5–8.1)

## 2021-01-23 LAB — TOTAL PROTEIN, URINE DIPSTICK: Protein, ur: 300 mg/dL — AB

## 2021-01-23 MED ORDER — SODIUM CHLORIDE 0.9 % IV SOLN
10.0000 mg/kg | Freq: Once | INTRAVENOUS | Status: AC
  Administered 2021-01-23: 450 mg via INTRAVENOUS
  Filled 2021-01-23: qty 16

## 2021-01-23 MED ORDER — SODIUM CHLORIDE 0.9 % IV SOLN: Freq: Once | INTRAVENOUS | Status: AC

## 2021-01-23 NOTE — Telephone Encounter (Signed)
Completed Physicians statement form.  Faxed to [phone]

## 2021-01-23 NOTE — Progress Notes (Signed)
[facility] at [place], [place] [address] [phone]   Interval Evaluation  Date of Service: 01/23/21 Patient Name: Jillian Gomez Patient MRN: [id number] Patient DOB: October 03, 1955 Provider: Ventura Sellers, MD  Identifying Statement:  Jillian Gomez is a 66 y.o. female with left occipital glioblastoma   Oncologic History: Oncology History  Glioblastoma with isocitrate dehydrogenase gene wildtype ([place])  12/23/2018 Surgery   Craniotomy, left occipital resection by Dr. Kathyrn Sheriff.  Path is GBM IDH-wt   01/23/2019 - 03/03/2019 Radiation Therapy   IMRT with concurrent Temozolomide 38m/m2   03/28/2019 - 09/04/2019 Chemotherapy   5 cycles of adjuvant 5-day Temozolomide 2087mm2    09/01/2019 Progression   Progression of disease #1   09/19/2019 -  Chemotherapy   Begins second line therapy with oral CCNU 907m2 q6 weeks, concurrent Avastin 80m50m IV q2 weeks   04/30/2020 -  Chemotherapy   Completes 4th cycle of CCNU/Avastin, transitions to Avastin monotherapy due to cytopenias   08/27/2020 -  Chemotherapy   Resumes CCNU+Avastin after periventricular DWI signal change noted on MRI    12/19/2020 -  Chemotherapy   Patient is on Treatment Plan : BRAIN GLIOBLASTOMA Consolidation Temozolomide Days 1-5 q28 Days        Biomarkers:  MGMT Unknown.  IDH 1/2 Wild type.  EGFR Unknown  TERT Unknown   Interval History:  Jillian FOLDENsents today for visit and avastin infusion. No new or progressive complaints today.  No issues with Temodar.  She did have a mild/moderate head cold last week. Otherwise no new or progressive neurologic complaints. Appetite remains somewhat poor.  Stable right sided vision, no clear change from prior.  No right sided weakness. No seizures or headaches.  H+P (01/09/19) Patient presented to medical attention in early December with ~2 months history of right sided visual impairment.  She describes fuzzy or  blurry vision on that side which was progressive over time, leading to at least one fall.  MRI brain demonstrated enhancing left occipital mass; this was subsequently resected by Dr. NundKathyrn Sheriff12/18/20.  She had no issues with surgery and has completed her steroid taper.  Continues on keppra daily but no history of any seizure. She has returned to work with only modest difficutly.  No issues walking or performing ADLs; lives alone but her sister and other family members are nearby.  Medications: Current Outpatient Medications on File Prior to Visit  Medication Sig Dispense Refill   amLODipine (NORVASC) 2.5 MG tablet Take 1 tablet (2.5 mg total) by mouth daily. 30 tablet 3   Biotin 1 MG CAPS Take 1 mg by mouth daily.     CALCIUM PO Take 1 tablet by mouth daily.     levothyroxine (SYNTHROID) 75 MCG tablet Take 75 mcg by mouth daily.     naproxen sodium (ALEVE) 220 MG tablet Take 1 tablet (220 mg total) by mouth daily as needed (pain).     omeprazole (PRILOSEC) 20 MG capsule Take 1 capsule (20 mg total) by mouth daily. 30 capsule 3   rosuvastatin (CRESTOR) 10 MG tablet Take 5 mg by mouth at bedtime.     temozolomide (TEMODAR) 20 MG capsule Take 3 capsules (60 mg total) by mouth daily. May take on an empty stomach to decrease nausea & vomiting. 84 capsule 0   temozolomide (TEMODAR) 5 MG capsule Take 2 capsules (10 mg total) by mouth daily. May take on an empty stomach to decrease nausea &  vomiting. 56 capsule 0   doxycycline (VIBRA-TABS) 100 MG tablet Take 1 tablet by mouth daily. (Patient not taking: Reported on 11/05/2020)     ondansetron (ZOFRAN) 8 MG tablet Take 1 tablet (8 mg total) by mouth 2 (two) times daily as needed (nausea and vomiting). May take 30-60 minutes prior to Temodar administration if nausea/vomiting occurs. (Patient not taking: Reported on 01/09/2021) 30 tablet 1   No current facility-administered medications on file prior to visit.    Allergies:  Allergies  Allergen Reactions    Etodolac Other (See Comments)    GI upset   Macrobid [Nitrofurantoin] Rash   Past Medical History:  Past Medical History:  Diagnosis Date   High cholesterol    Hypothyroidism    Joint pain    Thrombocytopenia ([place]) 02/26/2020   Past Surgical History:  Past Surgical History:  Procedure Laterality Date   APPLICATION OF CRANIAL NAVIGATION Left 12/23/2018   Procedure: APPLICATION OF CRANIAL NAVIGATION;  Surgeon: Consuella Lose, MD;  Location: [facility];  Service: Neurosurgery;  Laterality: Left;  APPLICATION OF CRANIAL NAVIGATION   BREAST BIOPSY Left    COLONOSCOPY     CRANIOTOMY Left 12/23/2018   Procedure: STEREOTACTIC LEFT OCCIPITAL CRANIOTOMY FOR RESECTION OF TUMOR;  Surgeon: Consuella Lose, MD;  Location: [facility];  Service: Neurosurgery;  Laterality: Left;  STEREOTACTIC LEFT OCCIPITAL CRANIOTOMY FOR RESECTION OF TUMOR   LASIK     Social History:  Social History   Socioeconomic History   Marital status: Single    Spouse name: Not on file   Number of children: Not on file   Years of education: Not on file   Highest education level: Not on file  Occupational History   Not on file  Tobacco Use   Smoking status: Never   Smokeless tobacco: Never  Vaping Use   Vaping Use: Never used  Substance and Sexual Activity   Alcohol use: Not Currently   Drug use: Never   Sexual activity: Not on file  Other Topics Concern   Not on file  Social History Narrative   Not on file   Social Determinants of Health   Financial Resource Strain: Not on file  Food Insecurity: Not on file  Transportation Needs: Not on file  Physical Activity: Not on file  Stress: Not on file  Social Connections: Not on file  Intimate Partner Violence: Not on file   Family History: No family history on file.  Review of Systems: Constitutional: Denies fevers, chills or abnormal weight loss Eyes: Denies blurriness of vision Gastrointestinal:  Denies nausea, constipation, diarrhea GU: Denies dysuria  or incontinence Skin: Denies abnormal skin rashes Musculoskeletal: Denies joint pain, back or neck discomfort.  Behavioral/Psych: Denies anxiety, mood instability  Physical Exam: Vitals:   01/23/21 1215  BP: 138/82  Pulse: 99  Resp: 18  Temp: 97.7 F (36.5 C)  SpO2: 100%   KPS: 80. General: Alert, cooperative, pleasant, in no acute distress Head: Craniotomy scar noted, dry and intact. EENT: No conjunctival injection or scleral icterus. Oral mucosa moist Lungs: Resp effort normal Cardiac: Regular rate and rhythm Abdomen: Soft, non-distended abdomen Skin: No rashes cyanosis or petechiae. Extremities: Left shin skin level cut, bleeding  Neurologic Exam: Mental Status: Awake, alert, attentive to examiner. Oriented to self and environment. Language is notable for mild transcortical expressive dyshpasia. Cranial Nerves: Visual acuity is grossly normal. Right homonymous hemianopia. Extra-ocular movements intact. No ptosis. Face is symmetric, tongue midline. Motor: Tone and bulk are normal. Power is full  in both arms and legs. Reflexes are symmetric, no pathologic reflexes present. Intact finger to nose bilaterally Sensory: Intact to light touch and temperature Gait: Normal and tandem gait is deferred.   Labs: I have reviewed the data as listed    Component Value Date/Time   NA 136 01/23/2021 1211   K 4.2 01/23/2021 1211   CL 105 01/23/2021 1211   CO2 23 01/23/2021 1211   GLUCOSE 114 (H) 01/23/2021 1211   BUN 48 (H) 01/23/2021 1211   CREATININE 1.73 (H) 01/23/2021 1211   CALCIUM 8.5 (L) 01/23/2021 1211   PROT 6.2 (L) 01/23/2021 1211   ALBUMIN 3.7 01/23/2021 1211   AST 37 01/23/2021 1211   ALT 25 01/23/2021 1211   ALKPHOS 179 (H) 01/23/2021 1211   BILITOT 0.5 01/23/2021 1211   GFRNONAA 32 (L) 01/23/2021 1211   GFRAA >60 10/03/2019 1133   Lab Results  Component Value Date   WBC 8.8 01/23/2021   NEUTROABS 6.9 01/23/2021   HGB 9.9 (L) 01/23/2021   HCT 28.5 (L) 01/23/2021    MCV 99.3 01/23/2021   PLT 113 (L) 01/23/2021    Assessment/Plan Glioblastoma, IDH-1 WT  Jillian Gomez is clinically stable today, now having completed cycle #1 metronomic Temodar+avastin.    We recommended continuing with cycle #2 metronomic Temodar 70m/m2 daily.  We will keep avastin on board as well q2 weeks.    Chemotherapy should be held for the following:  ANC less than 1,000  Platelets less than 100,000  LFT or creatinine greater than 2x ULN  If clinical concerns/contraindications develop  Avastin should be held for the following:   ANC less than 500  Platelets less than 50,000  LFT or creatinine greater than 2x ULN  If clinical concerns/contraindications develop  May con't amlodipine 2.54mdaily.  We ask that RoTHALIA TURKINGTONeturn to clinic in 2 weeks with labs for review  All questions were answered. The patient knows to call the clinic with any problems, questions or concerns. No barriers to learning were detected.  I have spent a total of 30 minutes of face-to-face and non-face-to-face time, excluding clinical staff time, preparing to see patient, ordering tests and/or medications, counseling the patient, and independently interpreting results and communicating results to the patient/family/caregiver    ZaVentura SellersMD Medical Director of Neuro-Oncology CoN[facility] Hospitalt WeAirport Road Addition1/19/23 12:52 PM

## 2021-01-28 ENCOUNTER — Telehealth: Payer: Self-pay

## 2021-01-28 DIAGNOSIS — C719 Malignant neoplasm of brain, unspecified: Secondary | ICD-10-CM | POA: Diagnosis not present

## 2021-01-28 DIAGNOSIS — H25813 Combined forms of age-related cataract, bilateral: Secondary | ICD-10-CM | POA: Diagnosis not present

## 2021-01-28 DIAGNOSIS — H53461 Homonymous bilateral field defects, right side: Secondary | ICD-10-CM | POA: Diagnosis not present

## 2021-01-28 DIAGNOSIS — H04123 Dry eye syndrome of bilateral lacrimal glands: Secondary | ICD-10-CM | POA: Diagnosis not present

## 2021-01-28 NOTE — Telephone Encounter (Signed)
Sister called wanting to know when the patient can restart the Temodar. They skipped Friday, Saturday, and Sunday night because the patient was sick - coughing a lot and very fatigued. The sister reports the patient is feeling a lot better.

## 2021-02-04 ENCOUNTER — Other Ambulatory Visit: Payer: Self-pay | Admitting: Internal Medicine

## 2021-02-07 ENCOUNTER — Other Ambulatory Visit (HOSPITAL_COMMUNITY): Payer: Self-pay

## 2021-02-10 ENCOUNTER — Other Ambulatory Visit (HOSPITAL_COMMUNITY): Payer: Self-pay

## 2021-02-10 ENCOUNTER — Other Ambulatory Visit: Payer: Self-pay | Admitting: Internal Medicine

## 2021-02-10 ENCOUNTER — Other Ambulatory Visit: Payer: Self-pay | Admitting: Radiation Therapy

## 2021-02-11 ENCOUNTER — Inpatient Hospital Stay: Payer: BC Managed Care – PPO

## 2021-02-11 ENCOUNTER — Encounter: Payer: Self-pay | Admitting: Internal Medicine

## 2021-02-11 ENCOUNTER — Inpatient Hospital Stay: Payer: BC Managed Care – PPO | Attending: Internal Medicine | Admitting: Internal Medicine

## 2021-02-11 ENCOUNTER — Other Ambulatory Visit: Payer: Self-pay

## 2021-02-11 VITALS — BP 144/82 | HR 80 | Temp 97.9°F | Resp 15 | Ht 62.0 in | Wt 95.8 lb

## 2021-02-11 DIAGNOSIS — C719 Malignant neoplasm of brain, unspecified: Secondary | ICD-10-CM | POA: Diagnosis not present

## 2021-02-11 DIAGNOSIS — Z79899 Other long term (current) drug therapy: Secondary | ICD-10-CM | POA: Insufficient documentation

## 2021-02-11 DIAGNOSIS — Z9221 Personal history of antineoplastic chemotherapy: Secondary | ICD-10-CM | POA: Insufficient documentation

## 2021-02-11 DIAGNOSIS — Z923 Personal history of irradiation: Secondary | ICD-10-CM | POA: Diagnosis not present

## 2021-02-11 DIAGNOSIS — C714 Malignant neoplasm of occipital lobe: Secondary | ICD-10-CM | POA: Diagnosis not present

## 2021-02-11 DIAGNOSIS — E039 Hypothyroidism, unspecified: Secondary | ICD-10-CM | POA: Insufficient documentation

## 2021-02-11 LAB — CBC WITH DIFFERENTIAL (CANCER CENTER ONLY)
Abs Immature Granulocytes: 0 10*3/uL (ref 0.00–0.07)
Basophils Absolute: 0 10*3/uL (ref 0.0–0.1)
Basophils Relative: 1 %
Eosinophils Absolute: 0.2 10*3/uL (ref 0.0–0.5)
Eosinophils Relative: 4 %
HCT: 29.5 % — ABNORMAL LOW (ref 36.0–46.0)
Hemoglobin: 9.8 g/dL — ABNORMAL LOW (ref 12.0–15.0)
Immature Granulocytes: 0 %
Lymphocytes Relative: 17 %
Lymphs Abs: 0.9 10*3/uL (ref 0.7–4.0)
MCH: 33.2 pg (ref 26.0–34.0)
MCHC: 33.2 g/dL (ref 30.0–36.0)
MCV: 100 fL (ref 80.0–100.0)
Monocytes Absolute: 0.6 10*3/uL (ref 0.1–1.0)
Monocytes Relative: 11 %
Neutro Abs: 3.5 10*3/uL (ref 1.7–7.7)
Neutrophils Relative %: 67 %
Platelet Count: 92 10*3/uL — ABNORMAL LOW (ref 150–400)
RBC: 2.95 MIL/uL — ABNORMAL LOW (ref 3.87–5.11)
RDW: 12.4 % (ref 11.5–15.5)
WBC Count: 5.2 10*3/uL (ref 4.0–10.5)
nRBC: 0 % (ref 0.0–0.2)

## 2021-02-11 LAB — CMP (CANCER CENTER ONLY)
ALT: 32 U/L (ref 0–44)
AST: 37 U/L (ref 15–41)
Albumin: 3.8 g/dL (ref 3.5–5.0)
Alkaline Phosphatase: 143 U/L — ABNORMAL HIGH (ref 38–126)
Anion gap: 7 (ref 5–15)
BUN: 32 mg/dL — ABNORMAL HIGH (ref 8–23)
CO2: 26 mmol/L (ref 22–32)
Calcium: 9.1 mg/dL (ref 8.9–10.3)
Chloride: 106 mmol/L (ref 98–111)
Creatinine: 1.73 mg/dL — ABNORMAL HIGH (ref 0.44–1.00)
GFR, Estimated: 32 mL/min — ABNORMAL LOW (ref >60)
Glucose, Bld: 80 mg/dL (ref 70–99)
Potassium: 3.9 mmol/L (ref 3.5–5.1)
Sodium: 139 mmol/L (ref 135–145)
Total Bilirubin: 0.5 mg/dL (ref 0.3–1.2)
Total Protein: 6.3 g/dL — ABNORMAL LOW (ref 6.5–8.1)

## 2021-02-11 LAB — TOTAL PROTEIN, URINE DIPSTICK: Protein, ur: 300 mg/dL — AB

## 2021-02-11 NOTE — Progress Notes (Signed)
[facility] [address] [place], [place] [address] [phone]   Interval Evaluation  Date of Service: 02/11/21 Patient Name: Jillian Gomez Patient MRN: [id number] Patient DOB: [birth year] Provider: Ventura Sellers, MD  Identifying Statement:  Jillian Gomez is a 66 y.o. female with left occipital glioblastoma   Oncologic History: Oncology History  Glioblastoma with isocitrate dehydrogenase gene wildtype ([place])  12/23/2018 Surgery   Craniotomy, left occipital resection by Dr. Kathyrn Sheriff.  Path is GBM IDH-wt   01/23/2019 - 03/03/2019 Radiation Therapy   IMRT with concurrent Temozolomide 47m/m2   03/28/2019 - 09/04/2019 Chemotherapy   5 cycles of adjuvant 5-day Temozolomide 2021mm2    09/01/2019 Progression   Progression of disease #1   09/19/2019 -  Chemotherapy   Begins second line therapy with oral CCNU 9039m2 q6 weeks, concurrent Avastin 58m40m IV q2 weeks   04/30/2020 -  Chemotherapy   Completes 4th cycle of CCNU/Avastin, transitions to Avastin monotherapy due to cytopenias   08/27/2020 -  Chemotherapy   Resumes CCNU+Avastin after periventricular DWI signal change noted on MRI    12/19/2020 -  Chemotherapy   Patient is on Treatment Plan : BRAIN GLIOBLASTOMA Consolidation Temozolomide Days 1-5 q28 Days        Biomarkers:  MGMT Unknown.  IDH 1/2 Wild type.  EGFR Unknown  TERT Unknown   Interval History:  [name]. No new or progressive complaints today. Continues to tolerate the Temodar without issue. Otherwise no new or progressive neurologic complaints. Appetite remains somewhat poor.  Stable right sided vision, no clear change from prior.  No right sided weakness. No seizures or headaches.  H+P (01/09/19) Patient presented to medical attention in early December with ~2 months history of right sided visual impairment.  She describes fuzzy or blurry vision on that side  which was progressive over time, leading to at least one fall.  MRI brain demonstrated enhancing left occipital mass; this was subsequently resected by Dr. NundKathyrn Sheriff12/18/20.  She had no issues with surgery and has completed her steroid taper.  Continues on keppra daily but no history of any seizure. She has returned to work with only modest difficutly.  No issues walking or performing ADLs; lives alone but her sister and other family members are nearby.  Medications: Current Outpatient Medications on File Prior to Visit  Medication Sig Dispense Refill   amLODipine (NORVASC) 2.5 MG tablet Take 1 tablet (2.5 mg total) by mouth daily. 30 tablet 3   Biotin 1 MG CAPS Take 1 mg by mouth daily.     CALCIUM PO Take 1 tablet by mouth daily.     doxycycline (VIBRA-TABS) 100 MG tablet Take 1 tablet by mouth daily. (Patient not taking: Reported on 11/05/2020)     levothyroxine (SYNTHROID) 75 MCG tablet Take 75 mcg by mouth daily.     naproxen sodium (ALEVE) 220 MG tablet Take 1 tablet (220 mg total) by mouth daily as needed (pain).     omeprazole (PRILOSEC) 20 MG capsule Take 1 capsule (20 mg total) by mouth daily. 30 capsule 3   ondansetron (ZOFRAN) 8 MG tablet Take 1 tablet (8 mg total) by mouth 2 (two) times daily as needed (nausea and vomiting). May take 30-60 minutes prior to Temodar administration if nausea/vomiting occurs. (Patient not taking: Reported on 01/09/2021) 30 tablet 1   rosuvastatin (CRESTOR) 10 MG tablet Take 5 mg by mouth at bedtime.  temozolomide (TEMODAR) 20 MG capsule Take 3 capsules (60 mg total) by mouth daily. May take on an empty stomach to decrease nausea & vomiting. 84 capsule 0   temozolomide (TEMODAR) 5 MG capsule Take 2 capsules (10 mg total) by mouth daily. May take on an empty stomach to decrease nausea & vomiting. 56 capsule 0   No current facility-administered medications on file prior to visit.    Allergies:  Allergies  Allergen Reactions   Etodolac Other (See  Comments)    GI upset   Macrobid [Nitrofurantoin] Rash   Past Medical History:  Past Medical History:  Diagnosis Date   High cholesterol    Hypothyroidism    Joint pain    Thrombocytopenia ([place]) 02/26/2020   Past Surgical History:  Past Surgical History:  Procedure Laterality Date   APPLICATION OF CRANIAL NAVIGATION Left 12/23/2018   Procedure: APPLICATION OF CRANIAL NAVIGATION;  Surgeon: Consuella Lose, MD;  Location: [facility];  Service: Neurosurgery;  Laterality: Left;  APPLICATION OF CRANIAL NAVIGATION   BREAST BIOPSY Left    COLONOSCOPY     CRANIOTOMY Left 12/23/2018   Procedure: STEREOTACTIC LEFT OCCIPITAL CRANIOTOMY FOR RESECTION OF TUMOR;  Surgeon: Consuella Lose, MD;  Location: [facility];  Service: Neurosurgery;  Laterality: Left;  STEREOTACTIC LEFT OCCIPITAL CRANIOTOMY FOR RESECTION OF TUMOR   LASIK     Social History:  Social History   Socioeconomic History   Marital status: Single    Spouse name: Not on file   Number of children: Not on file   Years of education: Not on file   Highest education level: Not on file  Occupational History   Not on file  Tobacco Use   Smoking status: Never   Smokeless tobacco: Never  Vaping Use   Vaping Use: Never used  Substance and Sexual Activity   Alcohol use: Not Currently   Drug use: Never   Sexual activity: Not on file  Other Topics Concern   Not on file  Social History Narrative   Not on file   Social Determinants of Health   Financial Resource Strain: Not on file  Food Insecurity: Not on file  Transportation Needs: Not on file  Physical Activity: Not on file  Stress: Not on file  Social Connections: Not on file  Intimate Partner Violence: Not on file   Family History: No family history on file.  Review of Systems: Constitutional: Denies fevers, chills or abnormal weight loss Eyes: Denies blurriness of vision Gastrointestinal:  Denies nausea, constipation, diarrhea GU: Denies dysuria or incontinence Skin:  Denies abnormal skin rashes Musculoskeletal: Denies joint pain, back or neck discomfort.  Behavioral/Psych: Denies anxiety, mood instability  Physical Exam: Vitals:   02/11/21 1001  BP: (!) 144/82  Pulse: 80  Resp: 15  Temp: 97.9 F (36.6 C)  SpO2: 100%   KPS: 80. General: Alert, cooperative, pleasant, in no acute distress Head: Craniotomy scar noted, dry and intact. EENT: No conjunctival injection or scleral icterus. Oral mucosa moist Lungs: Resp effort normal Cardiac: Regular rate and rhythm Abdomen: Soft, non-distended abdomen Skin: No rashes cyanosis or petechiae. Extremities: Left shin skin level cut, bleeding  Neurologic Exam: Mental Status: Awake, alert, attentive to examiner. Oriented to self and environment. Language is notable for mild transcortical expressive dyshpasia. Cranial Nerves: Visual acuity is grossly normal. Right homonymous hemianopia. Extra-ocular movements intact. No ptosis. Face is symmetric, tongue midline. Motor: Tone and bulk are normal. Power is full in both arms and legs. Reflexes are symmetric, no pathologic  reflexes present. Intact finger to nose bilaterally Sensory: Intact to light touch and temperature Gait: Normal and tandem gait is deferred.   Labs: I have reviewed the data as listed    Component Value Date/Time   NA 139 02/11/2021 0944   K 3.9 02/11/2021 0944   CL 106 02/11/2021 0944   CO2 26 02/11/2021 0944   GLUCOSE 80 02/11/2021 0944   BUN 32 (H) 02/11/2021 0944   CREATININE 1.73 (H) 02/11/2021 0944   CALCIUM 9.1 02/11/2021 0944   PROT 6.3 (L) 02/11/2021 0944   ALBUMIN 3.8 02/11/2021 0944   AST 37 02/11/2021 0944   ALT 32 02/11/2021 0944   ALKPHOS 143 (H) 02/11/2021 0944   BILITOT 0.5 02/11/2021 0944   GFRNONAA 32 (L) 02/11/2021 0944   GFRAA >60 10/03/2019 1133   Lab Results  Component Value Date   WBC 5.2 02/11/2021   NEUTROABS 3.5 02/11/2021   HGB 9.8 (L) 02/11/2021   HCT 29.5 (L) 02/11/2021   MCV 100.0 02/11/2021    PLT 92 (L) 02/11/2021    Assessment/Plan Glioblastoma, IDH-1 WT  Jillian Gomez is clinically stable today, now halfway through cycle #2 metronomic Temodar+avastin.    We recommended continuing with cycle #2 (day 15/28) metronomic Temodar 67m/m2 daily.  We will hold avastin today and in two weeks, given her upcoming cataract surgery (2/24).  Chemotherapy should be held for the following:  ANC less than 1,000  Platelets less than 100,000  LFT or creatinine greater than 2x ULN  If clinical concerns/contraindications develop  Avastin should be held for the following:   ANC less than 500  Platelets less than 50,000  LFT or creatinine greater than 2x ULN  If clinical concerns/contraindications develop  May con't amlodipine 2.571mdaily.  We ask that RoRAELLE CHAMBERSeturn to clinic in 2 weeks with MRI brain for review, prior to cycle #3.  All questions were answered. The patient knows to call the clinic with any problems, questions or concerns. No barriers to learning were detected.  I have spent a total of 30 minutes of face-to-face and non-face-to-face time, excluding clinical staff time, preparing to see patient, ordering tests and/or medications, counseling the patient, and independently interpreting results and communicating results to the patient/family/caregiver    ZaVentura SellersMD Medical Director of Neuro-Oncology [facility] WeLa Puente2/07/23 12:31 PM

## 2021-02-12 ENCOUNTER — Other Ambulatory Visit (HOSPITAL_COMMUNITY): Payer: Self-pay

## 2021-02-12 ENCOUNTER — Other Ambulatory Visit: Payer: Self-pay | Admitting: Internal Medicine

## 2021-02-12 DIAGNOSIS — C719 Malignant neoplasm of brain, unspecified: Secondary | ICD-10-CM

## 2021-02-13 ENCOUNTER — Encounter: Payer: Self-pay | Admitting: Internal Medicine

## 2021-02-13 ENCOUNTER — Other Ambulatory Visit (HOSPITAL_COMMUNITY): Payer: Self-pay

## 2021-02-13 MED ORDER — TEMOZOLOMIDE 5 MG PO CAPS
10.0000 mg | ORAL_CAPSULE | Freq: Every day | ORAL | 0 refills | Status: DC
  Filled 2021-02-13: qty 56, 28d supply, fill #0

## 2021-02-13 MED ORDER — TEMOZOLOMIDE 20 MG PO CAPS
60.0000 mg | ORAL_CAPSULE | Freq: Every day | ORAL | 0 refills | Status: DC
  Filled 2021-02-13: qty 84, 28d supply, fill #0

## 2021-02-17 ENCOUNTER — Other Ambulatory Visit (HOSPITAL_COMMUNITY): Payer: Self-pay

## 2021-02-21 ENCOUNTER — Ambulatory Visit (HOSPITAL_COMMUNITY)
Admission: RE | Admit: 2021-02-21 | Discharge: 2021-02-21 | Disposition: A | Discharge status: Home / Self Care | Payer: BC Managed Care – PPO | Source: Ambulatory Visit | Attending: Internal Medicine | Admitting: Internal Medicine

## 2021-02-21 ENCOUNTER — Other Ambulatory Visit: Payer: Self-pay

## 2021-02-21 DIAGNOSIS — C719 Malignant neoplasm of brain, unspecified: Secondary | ICD-10-CM | POA: Insufficient documentation

## 2021-02-21 DIAGNOSIS — G9389 Other specified disorders of brain: Secondary | ICD-10-CM | POA: Diagnosis not present

## 2021-02-21 IMAGING — MR MR HEAD WO/W CM
13 series · 48 of 48 positions shown · IV contrast (gadavist)
Comparison: [DATE].  [DATE].  [DATE].  [DATE].

CLINICAL DATA: Brain/CNS neoplasm, assess treatment response.
Glioblastoma, restaging.

EXAM:
MRI HEAD WITHOUT AND WITH CONTRAST
TECHNIQUE: Multiplanar, multiecho pulse sequences of the brain and surrounding
structures were obtained without and with intravenous contrast.
CONTRAST:  4mL GADAVIST GADOBUTROL 1 MMOL/ML IV SOLN

[Series 5: DWI · axial · 3.0mm · 1.36mm/px · z∈[-81,+70]mm · 6 of 104 slices shown (1 of 2)]
[im 1/104]
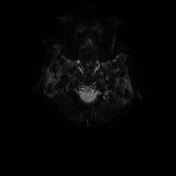
[im 21/104]
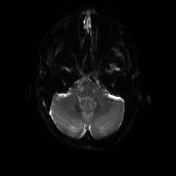
[im 42/104]
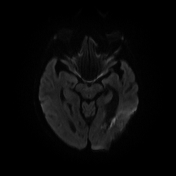
[im 62/104]
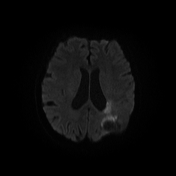
[im 83/104]
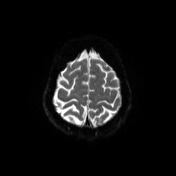
[im 104/104]
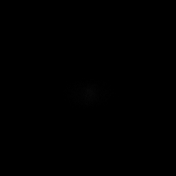

[Series 6: DWI · axial · 3.0mm · 1.36mm/px · z∈[-81,+70]mm · 3 of 52 slices shown (2 of 2)]
[im 1/52]
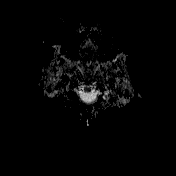
[im 26/52]
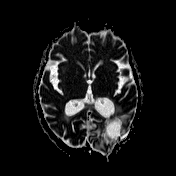
[im 52/52]
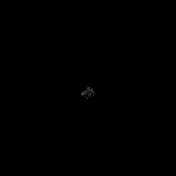

[Series 7: T1 · sagittal · 5.0mm · 0.75mm/px · 1 of 24 slices shown (1 of 2)]
[im 1/24]
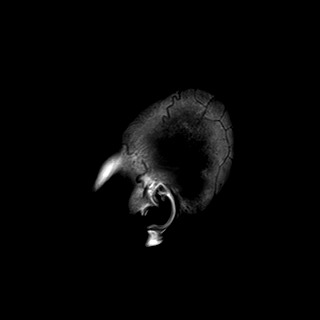

[Series 8: T2 · axial · 5.0mm · 0.62mm/px · z∈[-84,+77]mm · 2 of 26 slices shown]
[im 1/26]
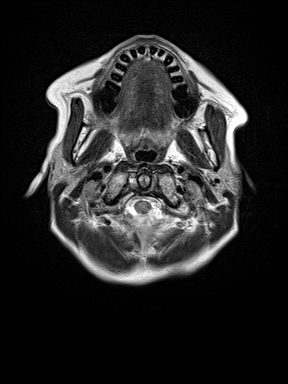
[im 26/26]
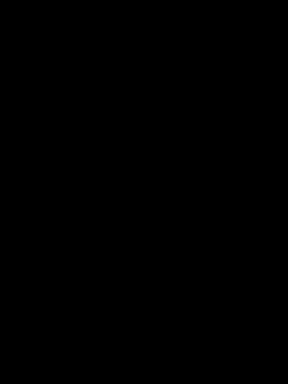

[Series 9: swi_images · axial · 3.0mm · 0.75mm/px · z∈[-85,+78]mm · 3 of 56 slices shown]
[im 1/56]
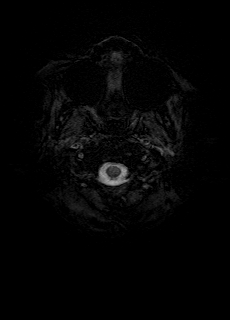
[im 28/56]
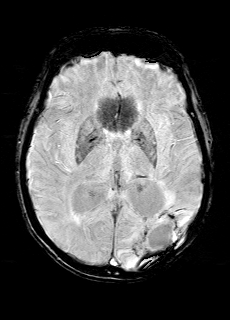
[im 56/56]
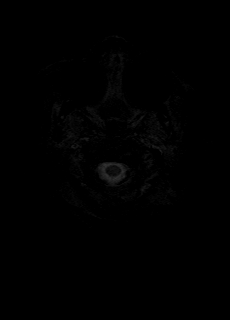

[Series 11: FLAIR · axial · 3.0mm · 0.75mm/px · z∈[-80,+71]mm · 3 of 52 slices shown]
[im 1/52]
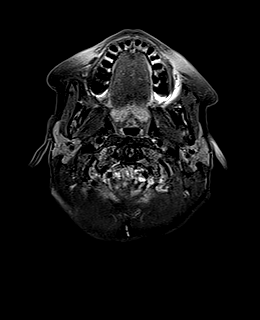
[im 26/52]
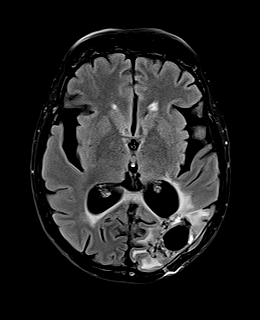
[im 52/52]
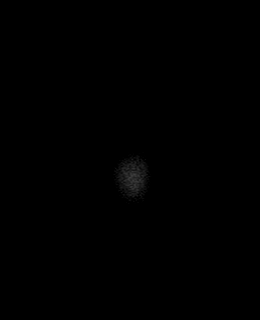

[Series 12: T1 · axial · 1.0mm · 0.94mm/px · z∈[-82,+75]mm · 10 of 160 slices shown (2 of 2)]
[im 1/160]
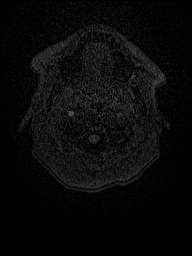
[im 18/160]
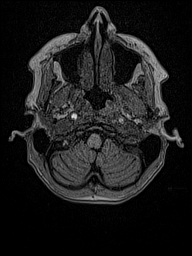
[im 36/160]
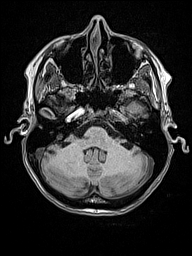
[im 54/160]
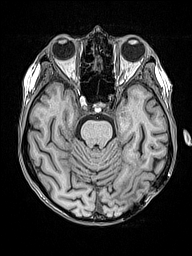
[im 71/160]
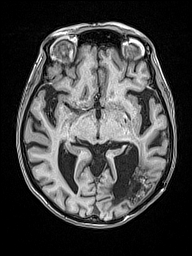
[im 89/160]
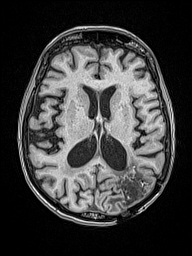
[im 107/160]
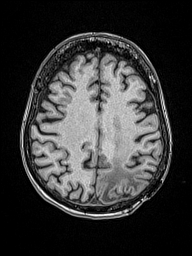
[im 124/160]
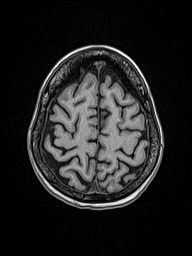
[im 142/160]
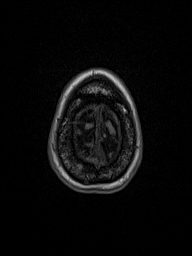
[im 160/160]
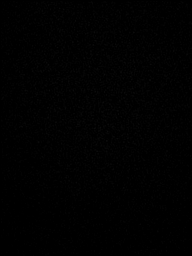

[Series 13: cor dwi_tracew · coronal · 5.0mm · 1.53mm/px · 3 of 54 slices shown]
[im 1/54]
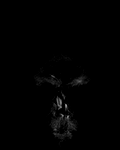
[im 27/54]
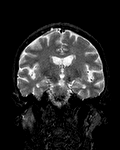
[im 54/54]
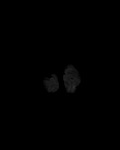

[Series 14: cor dwi_adc · coronal · 5.0mm · 1.53mm/px · 2 of 27 slices shown]
[im 1/27]
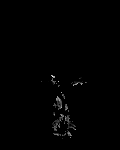
[im 27/27]
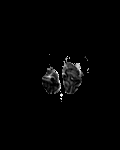

[Series 15: T2 post-contrast · coronal · 5.0mm · 0.57mm/px · 2 of 30 slices shown]
[im 1/30]
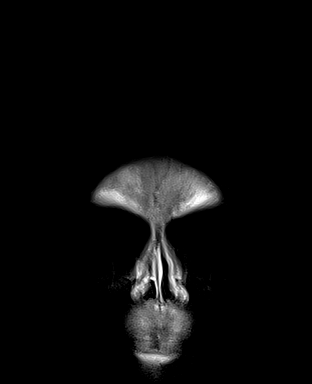
[im 30/30]
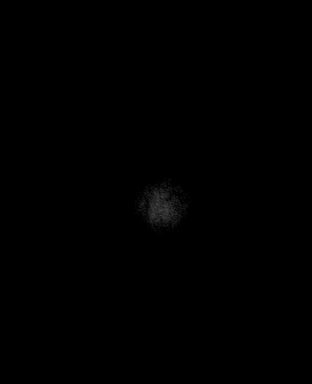

[Series 16: T1 post-contrast · axial · 1.0mm · 0.94mm/px · z∈[-82,+75]mm · 10 of 160 slices shown (1 of 3)]
[im 1/160]
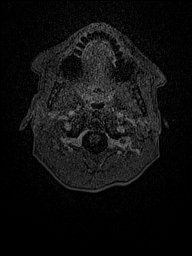
[im 18/160]
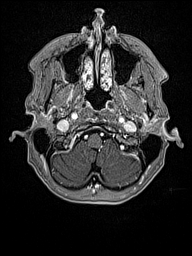
[im 36/160]
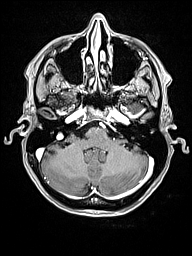
[im 54/160]
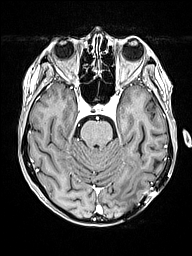
[im 71/160]
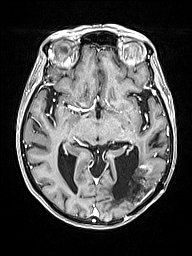
[im 89/160]
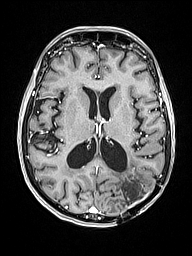
[im 107/160]
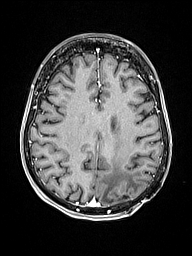
[im 124/160]
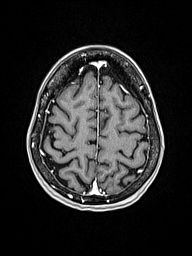
[im 142/160]
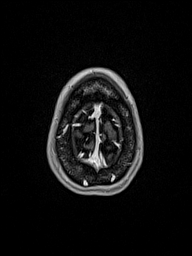
[im 160/160]
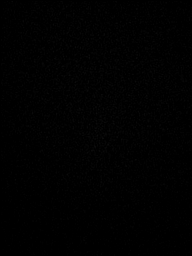

[Series 17: T1 post-contrast · coronal · 5.0mm · 0.43mm/px · 2 of 30 slices shown (2 of 3)]
[im 1/30]
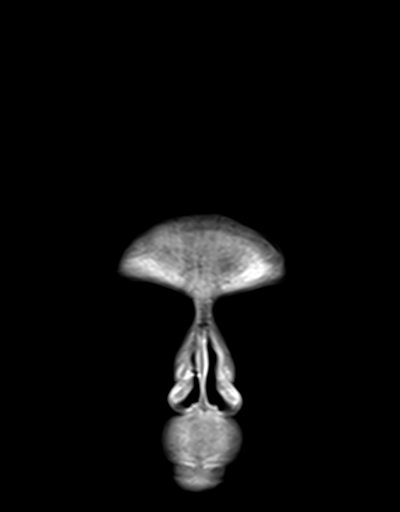
[im 30/30]
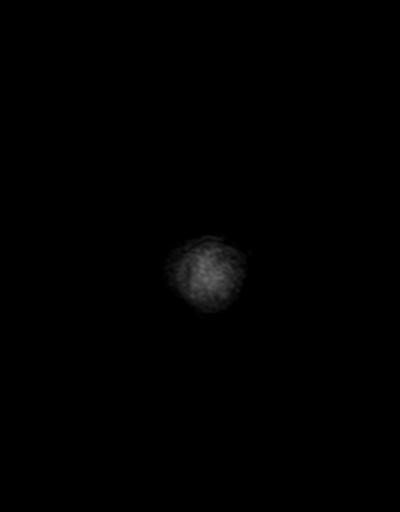

[Series 18: T1 post-contrast · sagittal · 5.0mm · 0.75mm/px · 1 of 24 slices shown (3 of 3)]
[im 1/24]
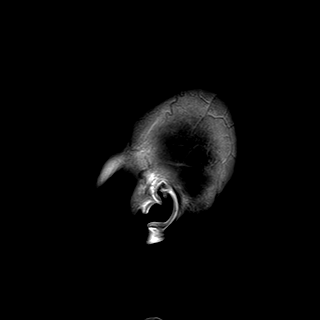

[48 of 48 positions shown; findings below may reference images not displayed]

FINDINGS: Brain: Brainstem and cerebellum remain normal. Right cerebral
hemisphere shows some stable T2 and FLAIR signal within the splenium
of the corpus callosum and the periatrial white matter.

Left hemisphere shows grossly stable postsurgical changes in the
left posterior temporal/parietal/occipital junction region with
stable appearing post resection cystic changes, no increasing gross
mass effect in no significant change in T2 and FLAIR signal within
the region of white matter. Hemosiderin deposition in the area
appears the same.

However, there are 2 progressive findings along the same lines as on
the previous examination worrisome for recurrent disease. 15 mm
focus of enlarging restricted diffusion adjacent to the posterior
body of the left lateral ventricle, previously measured 12 mm. This
does not show contrast enhancement. Second area of concern relates
to a slight further increase of enhancement along the anterior
inferior resection, difficult to measure precisely but fairly
convincingly slightly progressive.

Vascular: Major vessels at the base of the brain show flow.

Skull and upper cervical spine: Otherwise negative

Sinuses/Orbits: Clear/normal

Other: None
IMPRESSION: Two findings as seen previously that are suspicious for tumor
progression continue to worsen. Focus of restricted diffusion
adjacent to the posterior body of the left lateral ventricle now
measures 15 mm compared with 12 mm previously. No abnormal contrast
enhancement in that location. Region of increasing contrast
enhancement at the anterior inferior margin of the resection in the
left posterior temporal lobe continues to increase, difficult to
measure precisely due to the configuration.

## 2021-02-21 MED ORDER — GADOBUTROL 1 MMOL/ML IV SOLN
4.0000 mL | Freq: Once | INTRAVENOUS | Status: AC | PRN
  Administered 2021-02-21: 4 mL via INTRAVENOUS

## 2021-02-24 ENCOUNTER — Inpatient Hospital Stay: Payer: BC Managed Care – PPO

## 2021-02-25 ENCOUNTER — Inpatient Hospital Stay: Payer: BC Managed Care – PPO

## 2021-02-25 ENCOUNTER — Inpatient Hospital Stay (HOSPITAL_BASED_OUTPATIENT_CLINIC_OR_DEPARTMENT_OTHER): Payer: BC Managed Care – PPO | Admitting: Internal Medicine

## 2021-02-25 ENCOUNTER — Ambulatory Visit: Payer: BC Managed Care – PPO

## 2021-02-25 ENCOUNTER — Other Ambulatory Visit: Payer: Self-pay

## 2021-02-25 VITALS — BP 159/81 | HR 88 | Temp 97.8°F | Resp 15 | Ht 62.0 in | Wt 98.0 lb

## 2021-02-25 DIAGNOSIS — Z79899 Other long term (current) drug therapy: Secondary | ICD-10-CM | POA: Diagnosis not present

## 2021-02-25 DIAGNOSIS — Z9221 Personal history of antineoplastic chemotherapy: Secondary | ICD-10-CM | POA: Diagnosis not present

## 2021-02-25 DIAGNOSIS — Z923 Personal history of irradiation: Secondary | ICD-10-CM | POA: Diagnosis not present

## 2021-02-25 DIAGNOSIS — C714 Malignant neoplasm of occipital lobe: Secondary | ICD-10-CM | POA: Diagnosis not present

## 2021-02-25 DIAGNOSIS — C719 Malignant neoplasm of brain, unspecified: Secondary | ICD-10-CM | POA: Diagnosis not present

## 2021-02-25 DIAGNOSIS — E039 Hypothyroidism, unspecified: Secondary | ICD-10-CM | POA: Diagnosis not present

## 2021-02-25 LAB — CMP (CANCER CENTER ONLY)
ALT: 40 U/L (ref 0–44)
AST: 43 U/L — ABNORMAL HIGH (ref 15–41)
Albumin: 3.8 g/dL (ref 3.5–5.0)
Alkaline Phosphatase: 146 U/L — ABNORMAL HIGH (ref 38–126)
Anion gap: 6 (ref 5–15)
BUN: 39 mg/dL — ABNORMAL HIGH (ref 8–23)
CO2: 26 mmol/L (ref 22–32)
Calcium: 9 mg/dL (ref 8.9–10.3)
Chloride: 108 mmol/L (ref 98–111)
Creatinine: 1.99 mg/dL — ABNORMAL HIGH (ref 0.44–1.00)
GFR, Estimated: 27 mL/min — ABNORMAL LOW (ref >60)
Glucose, Bld: 83 mg/dL (ref 70–99)
Potassium: 4.5 mmol/L (ref 3.5–5.1)
Sodium: 140 mmol/L (ref 135–145)
Total Bilirubin: 0.5 mg/dL (ref 0.3–1.2)
Total Protein: 6.1 g/dL — ABNORMAL LOW (ref 6.5–8.1)

## 2021-02-25 LAB — TOTAL PROTEIN, URINE DIPSTICK: Protein, ur: >2000 mg/dL — AB

## 2021-02-25 LAB — CBC WITH DIFFERENTIAL (CANCER CENTER ONLY)
Abs Immature Granulocytes: 0.01 10*3/uL (ref 0.00–0.07)
Basophils Absolute: 0 10*3/uL (ref 0.0–0.1)
Basophils Relative: 1 %
Eosinophils Absolute: 0.2 10*3/uL (ref 0.0–0.5)
Eosinophils Relative: 3 %
HCT: 27.3 % — ABNORMAL LOW (ref 36.0–46.0)
Hemoglobin: 9.1 g/dL — ABNORMAL LOW (ref 12.0–15.0)
Immature Granulocytes: 0 %
Lymphocytes Relative: 17 %
Lymphs Abs: 0.9 10*3/uL (ref 0.7–4.0)
MCH: 32.6 pg (ref 26.0–34.0)
MCHC: 33.3 g/dL (ref 30.0–36.0)
MCV: 97.8 fL (ref 80.0–100.0)
Monocytes Absolute: 0.6 10*3/uL (ref 0.1–1.0)
Monocytes Relative: 11 %
Neutro Abs: 3.6 10*3/uL (ref 1.7–7.7)
Neutrophils Relative %: 68 %
Platelet Count: 76 10*3/uL — ABNORMAL LOW (ref 150–400)
RBC: 2.79 MIL/uL — ABNORMAL LOW (ref 3.87–5.11)
RDW: 12.2 % (ref 11.5–15.5)
WBC Count: 5.3 10*3/uL (ref 4.0–10.5)
nRBC: 0 % (ref 0.0–0.2)

## 2021-02-25 NOTE — Progress Notes (Signed)
[facility] at [place], [place] [address] [phone]   Interval Evaluation  Date of Service: 02/25/21 Patient Name: Jillian Gomez Patient MRN: [id number] Patient DOB: 01/28/1955 Provider: Ventura Sellers, MD  Identifying Statement:  Jillian Gomez is a 66 y.o. female with left occipital glioblastoma   Oncologic History: Oncology History  Glioblastoma with isocitrate dehydrogenase gene wildtype ([place])  12/23/2018 Surgery   Craniotomy, left occipital resection by Dr. Kathyrn Sheriff.  Path is GBM IDH-wt   01/23/2019 - 03/03/2019 Radiation Therapy   IMRT with concurrent Temozolomide 45m/m2   03/28/2019 - 09/04/2019 Chemotherapy   5 cycles of adjuvant 5-day Temozolomide 2092mm2    09/01/2019 Progression   Progression of disease #1   09/19/2019 -  Chemotherapy   Begins second line therapy with oral CCNU 903m2 q6 weeks, concurrent Avastin 18m55m IV q2 weeks   04/30/2020 -  Chemotherapy   Completes 4th cycle of CCNU/Avastin, transitions to Avastin monotherapy due to cytopenias   08/27/2020 -  Chemotherapy   Resumes CCNU+Avastin after periventricular DWI signal change noted on MRI    12/19/2020 -  Chemotherapy   Patient is on Treatment Plan : BRAIN GLIOBLASTOMA Consolidation Temozolomide Days 1-5 q28 Days        Biomarkers:  MGMT Unknown.  IDH 1/2 Wild type.  EGFR Unknown  TERT Unknown   Interval History:  Jillian MARRONEsents today for visit following recent MRI brain. No new or progressive complaints today. Continues to tolerate the Temodar without issue, she stopped it on Sunday as instructed, ahead of planned cataract surgery. Otherwise no new or progressive neurologic complaints. Appetite remains somewhat poor.  Stable right sided vision, no clear change from prior.  No right sided weakness. No seizures or headaches.  H+P (01/09/19) Patient presented to medical attention in early December with ~2 months history of right  sided visual impairment.  She describes fuzzy or blurry vision on that side which was progressive over time, leading to at least one fall.  MRI brain demonstrated enhancing left occipital mass; this was subsequently resected by Dr. NundKathyrn Sheriff12/18/20.  She had no issues with surgery and has completed her steroid taper.  Continues on keppra daily but no history of any seizure. She has returned to work with only modest difficutly.  No issues walking or performing ADLs; lives alone but her sister and other family members are nearby.  Medications: Current Outpatient Medications on File Prior to Visit  Medication Sig Dispense Refill   amLODipine (NORVASC) 2.5 MG tablet Take 1 tablet by mouth once daily 30 tablet 0   Biotin 1 MG CAPS Take 1 mg by mouth daily.     CALCIUM PO Take 1 tablet by mouth daily.     levothyroxine (SYNTHROID) 75 MCG tablet Take 75 mcg by mouth daily.     naproxen sodium (ALEVE) 220 MG tablet Take 1 tablet (220 mg total) by mouth daily as needed (pain).     omeprazole (PRILOSEC) 20 MG capsule Take 1 capsule (20 mg total) by mouth daily. 30 capsule 3   rosuvastatin (CRESTOR) 10 MG tablet Take 5 mg by mouth at bedtime.     doxycycline (VIBRA-TABS) 100 MG tablet Take 1 tablet by mouth daily. (Patient not taking: Reported on 11/05/2020)     ondansetron (ZOFRAN) 8 MG tablet Take 1 tablet (8 mg total) by mouth 2 (two) times daily as needed (nausea and vomiting). May take 30-60 minutes prior to Temodar  administration if nausea/vomiting occurs. (Patient not taking: Reported on 01/09/2021) 30 tablet 1   temozolomide (TEMODAR) 20 MG capsule Take 3 capsules (60 mg total) by mouth daily. May take on an empty stomach to decrease nausea & vomiting. (Patient not taking: Reported on 02/25/2021) 84 capsule 0   temozolomide (TEMODAR) 5 MG capsule Take 2 capsules (10 mg total) by mouth daily. May take on an empty stomach to decrease nausea & vomiting. (Patient not taking: Reported on 02/25/2021) 56  capsule 0   No current facility-administered medications on file prior to visit.    Allergies:  Allergies  Allergen Reactions   Etodolac Other (See Comments)    GI upset   Macrobid [Nitrofurantoin] Rash   Past Medical History:  Past Medical History:  Diagnosis Date   High cholesterol    Hypothyroidism    Joint pain    Thrombocytopenia ([place]) 02/26/2020   Past Surgical History:  Past Surgical History:  Procedure Laterality Date   APPLICATION OF CRANIAL NAVIGATION Left 12/23/2018   Procedure: APPLICATION OF CRANIAL NAVIGATION;  Surgeon: Consuella Lose, MD;  Location: [facility];  Service: Neurosurgery;  Laterality: Left;  APPLICATION OF CRANIAL NAVIGATION   BREAST BIOPSY Left    COLONOSCOPY     CRANIOTOMY Left 12/23/2018   Procedure: STEREOTACTIC LEFT OCCIPITAL CRANIOTOMY FOR RESECTION OF TUMOR;  Surgeon: Consuella Lose, MD;  Location: [facility];  Service: Neurosurgery;  Laterality: Left;  STEREOTACTIC LEFT OCCIPITAL CRANIOTOMY FOR RESECTION OF TUMOR   LASIK     Social History:  Social History   Socioeconomic History   Marital status: Single    Spouse name: Not on file   Number of children: Not on file   Years of education: Not on file   Highest education level: Not on file  Occupational History   Not on file  Tobacco Use   Smoking status: Never   Smokeless tobacco: Never  Vaping Use   Vaping Use: Never used  Substance and Sexual Activity   Alcohol use: Not Currently   Drug use: Never   Sexual activity: Not on file  Other Topics Concern   Not on file  Social History Narrative   Not on file   Social Determinants of Health   Financial Resource Strain: Not on file  Food Insecurity: Not on file  Transportation Needs: Not on file  Physical Activity: Not on file  Stress: Not on file  Social Connections: Not on file  Intimate Partner Violence: Not on file   Family History: No family history on file.  Review of Systems: Constitutional: Denies fevers, chills or  abnormal weight loss Eyes: Denies blurriness of vision Gastrointestinal:  Denies nausea, constipation, diarrhea GU: Denies dysuria or incontinence Skin: Denies abnormal skin rashes Musculoskeletal: Denies joint pain, back or neck discomfort.  Behavioral/Psych: Denies anxiety, mood instability  Physical Exam: Vitals:   02/25/21 1142  BP: (!) 159/81  Pulse: 88  Resp: 15  Temp: 97.8 F (36.6 C)  SpO2: 100%   KPS: 80. General: Alert, cooperative, pleasant, in no acute distress Head: Craniotomy scar noted, dry and intact. EENT: No conjunctival injection or scleral icterus. Oral mucosa moist Lungs: Resp effort normal Cardiac: Regular rate and rhythm Abdomen: Soft, non-distended abdomen Skin: No rashes cyanosis or petechiae. Extremities: Left shin skin level cut, bleeding  Neurologic Exam: Mental Status: Awake, alert, attentive to examiner. Oriented to self and environment. Language is notable for mild transcortical expressive dyshpasia. Cranial Nerves: Visual acuity is grossly normal. Right homonymous hemianopia. Extra-ocular movements  intact. No ptosis. Face is symmetric, tongue midline. Motor: Tone and bulk are normal. Power is full in both arms and legs. Reflexes are symmetric, no pathologic reflexes present. Intact finger to nose bilaterally Sensory: Intact to light touch and temperature Gait: Normal and tandem gait is deferred.   Labs: I have reviewed the data as listed    Component Value Date/Time   NA 140 02/25/2021 1114   K 4.5 02/25/2021 1114   CL 108 02/25/2021 1114   CO2 26 02/25/2021 1114   GLUCOSE 83 02/25/2021 1114   BUN 39 (H) 02/25/2021 1114   CREATININE 1.99 (H) 02/25/2021 1114   CALCIUM 9.0 02/25/2021 1114   PROT 6.1 (L) 02/25/2021 1114   ALBUMIN 3.8 02/25/2021 1114   AST 43 (H) 02/25/2021 1114   ALT 40 02/25/2021 1114   ALKPHOS 146 (H) 02/25/2021 1114   BILITOT 0.5 02/25/2021 1114   GFRNONAA 27 (L) 02/25/2021 1114   GFRAA >60 10/03/2019 1133   Lab  Results  Component Value Date   WBC 5.3 02/25/2021   NEUTROABS 3.6 02/25/2021   HGB 9.1 (L) 02/25/2021   HCT 27.3 (L) 02/25/2021   MCV 97.8 02/25/2021   PLT 76 (L) 02/25/2021    Assessment/Plan Glioblastoma, IDH-1 WT  Jillian Gomez is clinically stable today.  MRI brain demonstrates subtle changes involving post-contrast and DWI sequences, possibly consistent with progression of disease.  We will keep close eye on these changes with short interval imaging; if persistent will move to next line of therapy.   We recommended continuing with cycle #3 (day 15/28) metronomic Temodar 77m/m2 daily.  We will hold avastin for an additional 3 weeks given her upcoming cataract surgery (2/24).  Temodar may be resumed next Tuesday 03/04/21, once early recovery period from cataract surgery is complete.  Chemotherapy should be held for the following:  ANC less than 1,000  Platelets less than 100,000  LFT or creatinine greater than 2x ULN  If clinical concerns/contraindications develop  Avastin should be held for the following:   ANC less than 500  Platelets less than 50,000  LFT or creatinine greater than 2x ULN  If clinical concerns/contraindications develop  May con't amlodipine 2.534mdaily.  We ask that Jillian ELLINGWOODeturn to clinic in 3 weeks for avastin infusion.  Next MRI should be in 5 weeks following next treatment.  All questions were answered. The patient knows to call the clinic with any problems, questions or concerns. No barriers to learning were detected.  I have spent a total of 40 minutes of face-to-face and non-face-to-face time, excluding clinical staff time, preparing to see patient, ordering tests and/or medications, counseling the patient, and independently interpreting results and communicating results to the patient/family/caregiver    ZaVentura SellersMD Medical Director of Neuro-Oncology [facility] We[facility] Spring2/21/23 1:47 PM

## 2021-02-26 ENCOUNTER — Telehealth: Payer: Self-pay | Admitting: Internal Medicine

## 2021-02-26 NOTE — Telephone Encounter (Signed)
Scheduled per 2/21 los, pt has been called and confirmed  °

## 2021-02-28 DIAGNOSIS — H25811 Combined forms of age-related cataract, right eye: Secondary | ICD-10-CM | POA: Diagnosis not present

## 2021-03-06 ENCOUNTER — Telehealth: Payer: Self-pay | Admitting: *Deleted

## 2021-03-06 NOTE — Telephone Encounter (Signed)
Patient sister [name] called.  States that patient just got over cataract surgery and is doing well.  Her Avastin was put on hold and her temodar was delayed and restarted after the surgery per Dr Mickeal Skinner instructions.  They have the option to have the second eye cataract surgery done on 03/14/2021 and wanted to know if he would advise to go ahead and get it done or should that wait.  He advised that he does not recommend her getting the second eye done yet since her last imaging was progressive.  Her treatments here are more important. ? ?Sister understood. ?

## 2021-03-10 ENCOUNTER — Other Ambulatory Visit (HOSPITAL_COMMUNITY): Payer: Self-pay

## 2021-03-17 ENCOUNTER — Other Ambulatory Visit (HOSPITAL_COMMUNITY): Payer: Self-pay

## 2021-03-18 ENCOUNTER — Inpatient Hospital Stay: Payer: BC Managed Care – PPO | Admitting: Internal Medicine

## 2021-03-18 ENCOUNTER — Other Ambulatory Visit: Payer: Self-pay

## 2021-03-18 ENCOUNTER — Inpatient Hospital Stay: Payer: BC Managed Care – PPO | Attending: Internal Medicine

## 2021-03-18 ENCOUNTER — Inpatient Hospital Stay: Payer: BC Managed Care – PPO

## 2021-03-18 VITALS — BP 131/65

## 2021-03-18 VITALS — BP 140/75 | HR 81 | Temp 97.0°F | Resp 16 | Wt 98.2 lb

## 2021-03-18 DIAGNOSIS — Z9221 Personal history of antineoplastic chemotherapy: Secondary | ICD-10-CM | POA: Diagnosis not present

## 2021-03-18 DIAGNOSIS — C714 Malignant neoplasm of occipital lobe: Secondary | ICD-10-CM | POA: Insufficient documentation

## 2021-03-18 DIAGNOSIS — E7800 Pure hypercholesterolemia, unspecified: Secondary | ICD-10-CM | POA: Insufficient documentation

## 2021-03-18 DIAGNOSIS — Z5112 Encounter for antineoplastic immunotherapy: Secondary | ICD-10-CM | POA: Insufficient documentation

## 2021-03-18 DIAGNOSIS — C719 Malignant neoplasm of brain, unspecified: Secondary | ICD-10-CM

## 2021-03-18 DIAGNOSIS — Z923 Personal history of irradiation: Secondary | ICD-10-CM | POA: Diagnosis not present

## 2021-03-18 DIAGNOSIS — E039 Hypothyroidism, unspecified: Secondary | ICD-10-CM | POA: Insufficient documentation

## 2021-03-18 DIAGNOSIS — Z79899 Other long term (current) drug therapy: Secondary | ICD-10-CM | POA: Insufficient documentation

## 2021-03-18 DIAGNOSIS — D649 Anemia, unspecified: Secondary | ICD-10-CM | POA: Insufficient documentation

## 2021-03-18 LAB — CBC WITH DIFFERENTIAL (CANCER CENTER ONLY)
Abs Immature Granulocytes: 0 10*3/uL (ref 0.00–0.07)
Basophils Absolute: 0 10*3/uL (ref 0.0–0.1)
Basophils Relative: 1 %
Eosinophils Absolute: 0.3 10*3/uL (ref 0.0–0.5)
Eosinophils Relative: 6 %
HCT: 26.3 % — ABNORMAL LOW (ref 36.0–46.0)
Hemoglobin: 8.9 g/dL — ABNORMAL LOW (ref 12.0–15.0)
Immature Granulocytes: 0 %
Lymphocytes Relative: 23 %
Lymphs Abs: 1.1 10*3/uL (ref 0.7–4.0)
MCH: 33.1 pg (ref 26.0–34.0)
MCHC: 33.8 g/dL (ref 30.0–36.0)
MCV: 97.8 fL (ref 80.0–100.0)
Monocytes Absolute: 0.7 10*3/uL (ref 0.1–1.0)
Monocytes Relative: 14 %
Neutro Abs: 2.7 10*3/uL (ref 1.7–7.7)
Neutrophils Relative %: 56 %
Platelet Count: 101 10*3/uL — ABNORMAL LOW (ref 150–400)
RBC: 2.69 MIL/uL — ABNORMAL LOW (ref 3.87–5.11)
RDW: 12.4 % (ref 11.5–15.5)
WBC Count: 4.8 10*3/uL (ref 4.0–10.5)
nRBC: 0 % (ref 0.0–0.2)

## 2021-03-18 LAB — CMP (CANCER CENTER ONLY)
ALT: 21 U/L (ref 0–44)
AST: 27 U/L (ref 15–41)
Albumin: 3.6 g/dL (ref 3.5–5.0)
Alkaline Phosphatase: 100 U/L (ref 38–126)
Anion gap: 9 (ref 5–15)
BUN: 38 mg/dL — ABNORMAL HIGH (ref 8–23)
CO2: 23 mmol/L (ref 22–32)
Calcium: 9.1 mg/dL (ref 8.9–10.3)
Chloride: 104 mmol/L (ref 98–111)
Creatinine: 1.86 mg/dL — ABNORMAL HIGH (ref 0.44–1.00)
GFR, Estimated: 30 mL/min — ABNORMAL LOW (ref >60)
Glucose, Bld: 78 mg/dL (ref 70–99)
Potassium: 4.1 mmol/L (ref 3.5–5.1)
Sodium: 136 mmol/L (ref 135–145)
Total Bilirubin: 0.6 mg/dL (ref 0.3–1.2)
Total Protein: 5.9 g/dL — ABNORMAL LOW (ref 6.5–8.1)

## 2021-03-18 LAB — TOTAL PROTEIN, URINE DIPSTICK: Protein, ur: 300 mg/dL — AB

## 2021-03-18 MED ORDER — AMLODIPINE BESYLATE 2.5 MG PO TABS: 2.5000 mg | ORAL_TABLET | Freq: Every day | ORAL | 1 refills | Status: DC

## 2021-03-18 MED ORDER — SODIUM CHLORIDE 0.9 % IV SOLN
10.0000 mg/kg | Freq: Once | INTRAVENOUS | Status: AC
  Administered 2021-03-18: 450 mg via INTRAVENOUS
  Filled 2021-03-18: qty 16

## 2021-03-18 MED ORDER — SODIUM CHLORIDE 0.9 % IV SOLN: Freq: Once | INTRAVENOUS | Status: AC

## 2021-03-18 NOTE — Progress Notes (Signed)
Per Dr Mickeal Skinner CBC/CMP/Urine Protein and vitals are all reviewed and are appropriate for Avastin transfusion today 03/18/2021 ?

## 2021-03-18 NOTE — Patient Instructions (Signed)
[facility]  Discharge Instructions: ?Thank you for choosing [facility] to provide your oncology and hematology care.  ? ?If you have a lab appointment with the [facility], please go directly to the [facility] and check in at the registration area. ?  ?Wear comfortable clothing and clothing appropriate for easy access to any Portacath or PICC line.  ? ?We strive to give you quality time with your provider. You may need to reschedule your appointment if you arrive late (15 or more minutes).  Arriving late affects you and other patients whose appointments are after yours.  Also, if you miss three or more appointments without notifying the office, you may be dismissed from the clinic at the provider?s discretion.    ?  ?For prescription refill requests, have your pharmacy contact our office and allow 72 hours for refills to be completed.   ? ?Today you received the following chemotherapy and/or immunotherapy agent: Bevacizumab (Mvasi) ?  ?To help prevent nausea and vomiting after your treatment, we encourage you to take your nausea medication as directed. ? ?BELOW ARE SYMPTOMS THAT SHOULD BE REPORTED IMMEDIATELY: ?*FEVER GREATER THAN 100.4 F (38 ?C) OR HIGHER ?*CHILLS OR SWEATING ?*NAUSEA AND VOMITING THAT IS NOT CONTROLLED WITH YOUR NAUSEA MEDICATION ?*UNUSUAL SHORTNESS OF BREATH ?*UNUSUAL BRUISING OR BLEEDING ?*URINARY PROBLEMS (pain or burning when urinating, or frequent urination) ?*BOWEL PROBLEMS (unusual diarrhea, constipation, pain near the anus) ?TENDERNESS IN MOUTH AND THROAT WITH OR WITHOUT PRESENCE OF ULCERS (sore throat, sores in mouth, or a toothache) ?UNUSUAL RASH, SWELLING OR PAIN  ?UNUSUAL VAGINAL DISCHARGE OR ITCHING  ? ?Items with * indicate a potential emergency and should be followed up as soon as possible or go to the Emergency Department if any problems should occur. ? ?Please show the CHEMOTHERAPY ALERT CARD or IMMUNOTHERAPY ALERT CARD at  check-in to the Emergency Department and triage nurse. ? ?Should you have questions after your visit or need to cancel or reschedule your appointment, please contact [facility]  Dept: [phone]  and follow the prompts.  Office hours are 8:00 a.m. to 4:30 p.m. Monday - Friday. Please note that voicemails left after 4:00 p.m. may not be returned until the following business day.  We are closed weekends and major holidays. You have access to a nurse at all times for urgent questions. Please call the main number to the clinic Dept: [phone] and follow the prompts. ? ? ?For any non-urgent questions, you may also contact your provider using MyChart. We now offer e-Visits for anyone 85 and older to request care online for non-urgent symptoms. For details visit [email or web address]. ?  ?Also download the MyChart app! Go to the app store, search "MyChart", open the app, select [facility], and log in with your MyChart username and password. ? ?Due to Covid, a mask is required upon entering the hospital/clinic. If you do not have a mask, one will be given to you upon arrival. For doctor visits, patients may have 1 support person aged 42 or older with them. For treatment visits, patients cannot have anyone with them due to current Covid guidelines and our immunocompromised population.  ? ?

## 2021-03-18 NOTE — Progress Notes (Signed)
? ?[facility] at [address]  ?[place],  [address] ?[phone] ? ? ?Interval Evaluation ? ?Date of Service: 03/18/21 ?Patient Name: Jillian Gomez ?Patient MRN: [id number] ?Patient DOB: 1955-04-29 ?Provider: Ventura Sellers, MD ? ?Identifying Statement:  ?Jillian Gomez is a 66 y.o. female with left occipital glioblastoma  ? ?Oncologic History: ?Oncology History  ?Glioblastoma with isocitrate dehydrogenase gene wildtype ([facility])  ?12/23/2018 Surgery  ? Craniotomy, left occipital resection by Dr. Kathyrn Sheriff.  Path is GBM IDH-wt ?  ?01/23/2019 - 03/03/2019 Radiation Therapy  ? IMRT with concurrent Temozolomide 71m/m2 ?  ?03/28/2019 - 09/04/2019 Chemotherapy  ? 5 cycles of adjuvant 5-day Temozolomide 2046mm2 ? ?  ?09/01/2019 Progression  ? Progression of disease #1 ?  ?09/19/2019 -  Chemotherapy  ? Begins second line therapy with oral CCNU 909m2 q6 weeks, concurrent Avastin 46m60m IV q2 weeks ?  ?04/30/2020 -  Chemotherapy  ? Completes 4th cycle of CCNU/Avastin, transitions to Avastin monotherapy due to cytopenias ?  ?08/27/2020 -  Chemotherapy  ? Resumes CCNU+Avastin after periventricular DWI signal change noted on MRI ? ?  ?12/19/2020 -  Chemotherapy  ? Patient is on Treatment Plan : BRAIN GLIOBLASTOMA Consolidation Temozolomide Days 1-5 q28 Days   ?   ? ? ?Biomarkers: ? ?MGMT Unknown.  ?IDH 1/2 Wild type.  ?EGFR Unknown  ?TERT Unknown  ? ?Interval History: ? RondBEZA STEPPEsents today for visit following recent MRI brain. No new or progressive complaints today. No issues with cataract surgery on 2/24 with ophthalmology.  Continues to tolerate the Temodar without issue, it was resumed on 2/28 as instructed. Otherwise no new or progressive neurologic complaints.  No right sided weakness. No seizures or headaches. ? ?H+P (01/09/19) Patient presented to medical attention in early December with ~2 months history of right sided visual impairment.  She describes fuzzy or blurry vision  on that side which was progressive over time, leading to at least one fall.  MRI brain demonstrated enhancing left occipital mass; this was subsequently resected by Dr. NundKathyrn Sheriff12/18/20.  She had no issues with surgery and has completed her steroid taper.  Continues on keppra daily but no history of any seizure. She has returned to work with only modest difficutly.  No issues walking or performing ADLs; lives alone but her sister and other family members are nearby. ? ?Medications: ?Current Outpatient Medications on File Prior to Visit  ?Medication Sig Dispense Refill  ? amLODipine (NORVASC) 2.5 MG tablet Take 1 tablet by mouth once daily 30 tablet 0  ? Biotin 1 MG CAPS Take 1 mg by mouth daily.    ? CALCIUM PO Take 1 tablet by mouth daily.    ? doxycycline (VIBRA-TABS) 100 MG tablet Take 1 tablet by mouth daily. (Patient not taking: Reported on 11/05/2020)    ? levothyroxine (SYNTHROID) 75 MCG tablet Take 75 mcg by mouth daily.    ? naproxen sodium (ALEVE) 220 MG tablet Take 1 tablet (220 mg total) by mouth daily as needed (pain).    ? omeprazole (PRILOSEC) 20 MG capsule Take 1 capsule (20 mg total) by mouth daily. 30 capsule 3  ? ondansetron (ZOFRAN) 8 MG tablet Take 1 tablet (8 mg total) by mouth 2 (two) times daily as needed (nausea and vomiting). May take 30-60 minutes prior to Temodar administration if nausea/vomiting occurs. (Patient not taking: Reported on 01/09/2021) 30 tablet 1  ? rosuvastatin (CRESTOR) 10 MG tablet Take 5 mg by mouth  at bedtime.    ? temozolomide (TEMODAR) 20 MG capsule Take 3 capsules (60 mg total) by mouth daily. May take on an empty stomach to decrease nausea & vomiting. (Patient not taking: Reported on 02/25/2021) 84 capsule 0  ? temozolomide (TEMODAR) 5 MG capsule Take 2 capsules (10 mg total) by mouth daily. May take on an empty stomach to decrease nausea & vomiting. (Patient not taking: Reported on 02/25/2021) 56 capsule 0  ? ?No current facility-administered medications on file  prior to visit.  ? ? ?Allergies:  ?Allergies  ?Allergen Reactions  ? Etodolac Other (See Comments)  ?  GI upset  ? Macrobid [Nitrofurantoin] Rash  ? ?Past Medical History:  ?Past Medical History:  ?Diagnosis Date  ? High cholesterol   ? Hypothyroidism   ? Joint pain   ? Thrombocytopenia ([facility]) 02/26/2020  ? ?Past Surgical History:  ?Past Surgical History:  ?Procedure Laterality Date  ? APPLICATION OF CRANIAL NAVIGATION Left 12/23/2018  ? Procedure: APPLICATION OF CRANIAL NAVIGATION;  Surgeon: Consuella Lose, MD;  Location: [facility];  Service: Neurosurgery;  Laterality: Left;  APPLICATION OF CRANIAL NAVIGATION  ? BREAST BIOPSY Left   ? COLONOSCOPY    ? CRANIOTOMY Left 12/23/2018  ? Procedure: STEREOTACTIC LEFT OCCIPITAL CRANIOTOMY FOR RESECTION OF TUMOR;  Surgeon: Consuella Lose, MD;  Location: [facility];  Service: Neurosurgery;  Laterality: Left;  STEREOTACTIC LEFT OCCIPITAL CRANIOTOMY FOR RESECTION OF TUMOR  ? LASIK    ? ?Social History:  ?Social History  ? ?Socioeconomic History  ? Marital status: Single  ?  Spouse name: Not on file  ? Number of children: Not on file  ? Years of education: Not on file  ? Highest education level: Not on file  ?Occupational History  ? Not on file  ?Tobacco Use  ? Smoking status: Never  ? Smokeless tobacco: Never  ?Vaping Use  ? Vaping Use: Never used  ?Substance and Sexual Activity  ? Alcohol use: Not Currently  ? Drug use: Never  ? Sexual activity: Not on file  ?Other Topics Concern  ? Not on file  ?Social History Narrative  ? Not on file  ? ?Social Determinants of Health  ? ?Financial Resource Strain: Not on file  ?Food Insecurity: Not on file  ?Transportation Needs: Not on file  ?Physical Activity: Not on file  ?Stress: Not on file  ?Social Connections: Not on file  ?Intimate Partner Violence: Not on file  ? ?Family History: No family history on file. ? ?Review of Systems: ?Constitutional: Denies fevers, chills or abnormal weight loss ?Eyes: Denies blurriness of  vision ?Gastrointestinal:  Denies nausea, constipation, diarrhea ?GU: Denies dysuria or incontinence ?Skin: Denies abnormal skin rashes ?Musculoskeletal: Denies joint pain, back or neck discomfort.  ?Behavioral/Psych: Denies anxiety, mood instability ? ?Physical Exam: ?Vitals:  ? 03/18/21 0908  ?BP: 140/75  ?Pulse: 81  ?Resp: 16  ?Temp: (!) 97 ?F (36.1 ?C)  ?SpO2: 100%  ? ? ?KPS: 80. ?General: Alert, cooperative, pleasant, in no acute distress ?Head: Craniotomy scar noted, dry and intact. ?EENT: No conjunctival injection or scleral icterus. Oral mucosa moist ?Lungs: Resp effort normal ?Cardiac: Regular rate and rhythm ?Abdomen: Soft, non-distended abdomen ?Skin: No rashes cyanosis or petechiae. ?Extremities: Left shin skin level cut, bleeding ? ?Neurologic Exam: ?Mental Status: Awake, alert, attentive to examiner. Oriented to self and environment. Language is notable for mild transcortical expressive dyshpasia. Cranial Nerves: Visual acuity is grossly normal. Right homonymous hemianopia. Extra-ocular movements intact. No ptosis. Face is symmetric, tongue midline. ?  Motor: Tone and bulk are normal. Power is full in both arms and legs. Reflexes are symmetric, no pathologic reflexes present. Intact finger to nose bilaterally ?Sensory: Intact to light touch and temperature ?Gait: Normal and tandem gait is deferred. ? ? ?Labs: ?I have reviewed the data as listed ?   ?Component Value Date/Time  ? NA 136 03/18/2021 0854  ? K 4.1 03/18/2021 0854  ? CL 104 03/18/2021 0854  ? CO2 23 03/18/2021 0854  ? GLUCOSE 78 03/18/2021 0854  ? BUN 38 (H) 03/18/2021 0854  ? CREATININE 1.86 (H) 03/18/2021 0854  ? CALCIUM 9.1 03/18/2021 0854  ? PROT 5.9 (L) 03/18/2021 0854  ? ALBUMIN 3.6 03/18/2021 0854  ? AST 27 03/18/2021 0854  ? ALT 21 03/18/2021 0854  ? ALKPHOS 100 03/18/2021 0854  ? BILITOT 0.6 03/18/2021 0854  ? GFRNONAA 30 (L) 03/18/2021 0854  ? GFRAA >60 10/03/2019 1133  ? ?Lab Results  ?Component Value Date  ? WBC 4.8 03/18/2021  ?  NEUTROABS 2.7 03/18/2021  ? HGB 8.9 (L) 03/18/2021  ? HCT 26.3 (L) 03/18/2021  ? MCV 97.8 03/18/2021  ? PLT 101 (L) 03/18/2021  ? ? ?Assessment/Plan ?Glioblastoma, IDH-1 WT ? ?MAIE KESINGER is clinically stable today.  C

## 2021-03-19 ENCOUNTER — Telehealth: Payer: Self-pay | Admitting: Internal Medicine

## 2021-03-19 NOTE — Telephone Encounter (Signed)
Scheduled per 3/14 los, pt has been called and confirmed  ?

## 2021-03-20 ENCOUNTER — Ambulatory Visit: Payer: BC Managed Care – PPO | Admitting: Internal Medicine

## 2021-03-20 ENCOUNTER — Other Ambulatory Visit: Payer: BC Managed Care – PPO

## 2021-03-20 ENCOUNTER — Ambulatory Visit: Payer: BC Managed Care – PPO

## 2021-03-21 ENCOUNTER — Other Ambulatory Visit (HOSPITAL_COMMUNITY): Payer: Self-pay

## 2021-03-24 ENCOUNTER — Other Ambulatory Visit (HOSPITAL_COMMUNITY): Payer: Self-pay

## 2021-03-24 ENCOUNTER — Other Ambulatory Visit: Payer: Self-pay | Admitting: Internal Medicine

## 2021-03-24 ENCOUNTER — Encounter: Payer: Self-pay | Admitting: Internal Medicine

## 2021-03-24 DIAGNOSIS — C719 Malignant neoplasm of brain, unspecified: Secondary | ICD-10-CM

## 2021-03-24 MED ORDER — TEMOZOLOMIDE 20 MG PO CAPS
60.0000 mg | ORAL_CAPSULE | Freq: Every day | ORAL | 0 refills | Status: DC
  Filled 2021-03-24: qty 84, 28d supply, fill #0

## 2021-03-24 MED ORDER — TEMOZOLOMIDE 5 MG PO CAPS
10.0000 mg | ORAL_CAPSULE | Freq: Every day | ORAL | 0 refills | Status: DC
  Filled 2021-03-24: qty 56, 28d supply, fill #0

## 2021-03-25 ENCOUNTER — Other Ambulatory Visit: Payer: Self-pay | Admitting: Radiation Therapy

## 2021-03-25 ENCOUNTER — Other Ambulatory Visit (HOSPITAL_COMMUNITY): Payer: Self-pay

## 2021-03-26 ENCOUNTER — Other Ambulatory Visit (HOSPITAL_COMMUNITY): Payer: Self-pay

## 2021-03-26 ENCOUNTER — Encounter: Payer: Self-pay | Admitting: Internal Medicine

## 2021-03-27 ENCOUNTER — Other Ambulatory Visit (HOSPITAL_COMMUNITY): Payer: Self-pay

## 2021-03-28 DIAGNOSIS — C719 Malignant neoplasm of brain, unspecified: Secondary | ICD-10-CM | POA: Diagnosis not present

## 2021-03-28 DIAGNOSIS — I10 Essential (primary) hypertension: Secondary | ICD-10-CM | POA: Diagnosis not present

## 2021-03-28 DIAGNOSIS — E785 Hyperlipidemia, unspecified: Secondary | ICD-10-CM | POA: Diagnosis not present

## 2021-03-28 DIAGNOSIS — E039 Hypothyroidism, unspecified: Secondary | ICD-10-CM | POA: Diagnosis not present

## 2021-03-29 ENCOUNTER — Ambulatory Visit (HOSPITAL_COMMUNITY)
Admission: RE | Admit: 2021-03-29 | Discharge: 2021-03-29 | Disposition: A | Discharge status: Home / Self Care | Payer: BC Managed Care – PPO | Source: Ambulatory Visit | Attending: Internal Medicine | Admitting: Internal Medicine

## 2021-03-29 DIAGNOSIS — G9389 Other specified disorders of brain: Secondary | ICD-10-CM | POA: Diagnosis not present

## 2021-03-29 DIAGNOSIS — C719 Malignant neoplasm of brain, unspecified: Secondary | ICD-10-CM | POA: Diagnosis not present

## 2021-03-29 IMAGING — MR MR HEAD WO/W CM
13 series · 48 of 48 positions shown · IV contrast (gadavist)
Comparison: [DATE]

CLINICAL DATA: CNS neoplasm, assess treatment response

EXAM:
MRI HEAD WITHOUT AND WITH CONTRAST
TECHNIQUE: Multiplanar, multiecho pulse sequences of the brain and surrounding
structures were obtained without and with intravenous contrast.
CONTRAST:  5mL GADAVIST GADOBUTROL 1 MMOL/ML IV SOLN

[Series 5: DWI · axial · 3.0mm · 1.36mm/px · z∈[-99,+65]mm · 5 of 112 slices shown (1 of 2)]
[im 1/112]
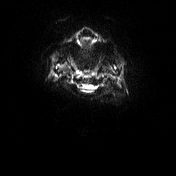
[im 28/112]
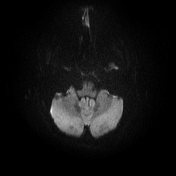
[im 56/112]
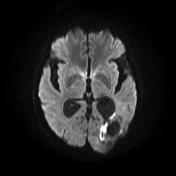
[im 84/112]
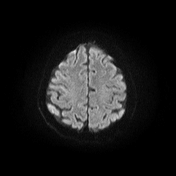
[im 112/112]
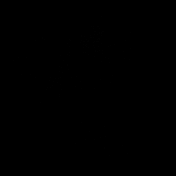

[Series 6: DWI · axial · 3.0mm · 1.36mm/px · z∈[-99,+62]mm · 3 of 55 slices shown (2 of 2)]
[im 1/55]
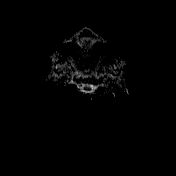
[im 28/55]
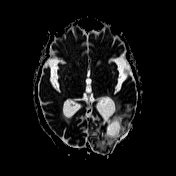
[im 55/55]
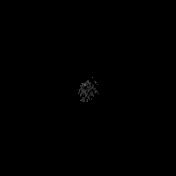

[Series 7: T1 · sagittal · 5.0mm · 0.75mm/px · 2 of 26 slices shown (1 of 2)]
[im 1/26]
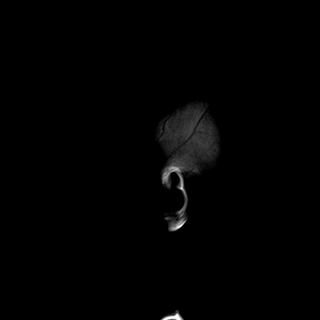
[im 26/26]
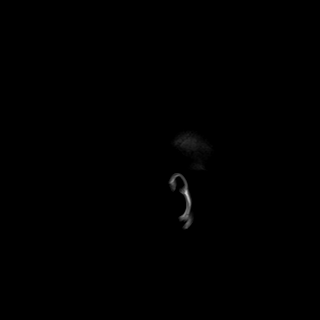

[Series 8: T2 · axial · 5.0mm · 0.62mm/px · z∈[-100,+62]mm · 2 of 26 slices shown]
[im 1/26]
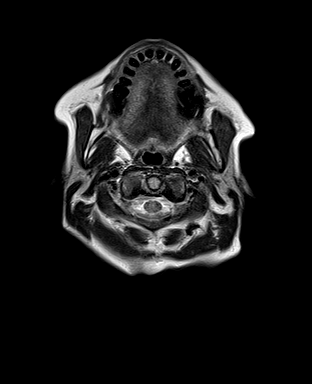
[im 26/26]
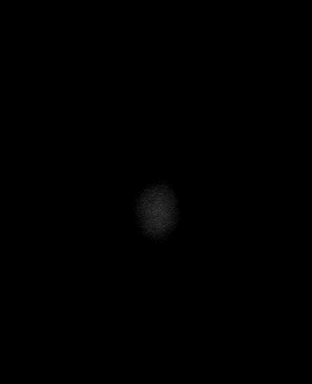

[Series 11: FLAIR · axial · 3.0mm · 0.75mm/px · z∈[-99,+62]mm · 3 of 55 slices shown]
[im 1/55]
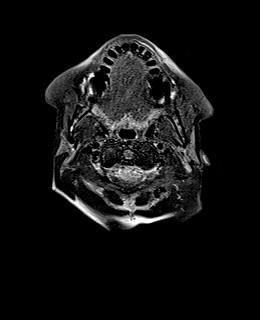
[im 28/55]
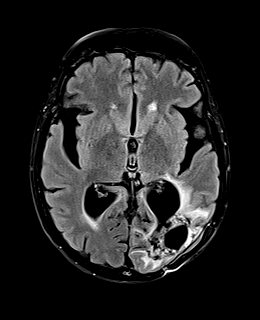
[im 55/55]
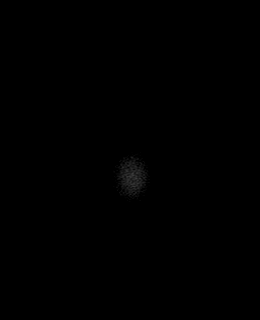

[Series 12: swi_images · axial · 3.0mm · 0.75mm/px · z∈[-101,+63]mm · 3 of 56 slices shown]
[im 1/56]
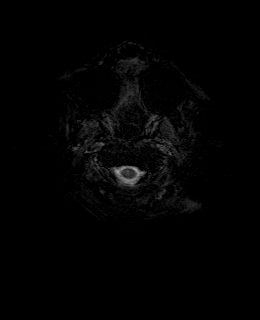
[im 28/56]
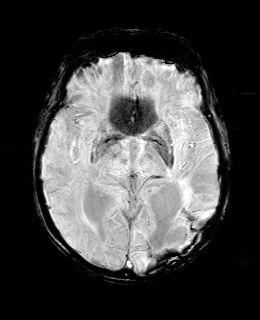
[im 56/56]
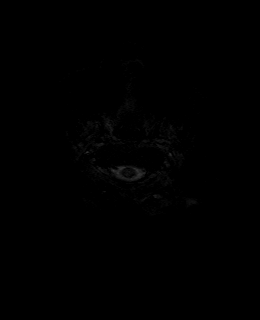

[Series 14: T1 · axial · 1.0mm · 0.94mm/px · z∈[-95,+63]mm · 9 of 160 slices shown (2 of 2)]
[im 1/160]
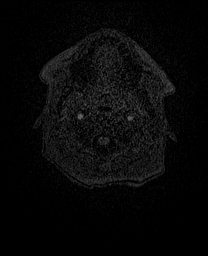
[im 20/160]
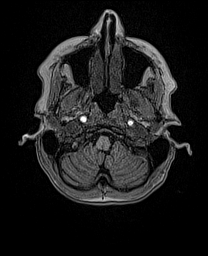
[im 40/160]
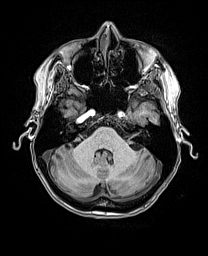
[im 60/160]
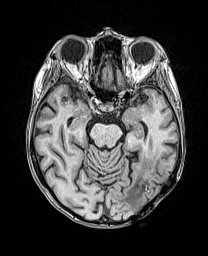
[im 80/160]
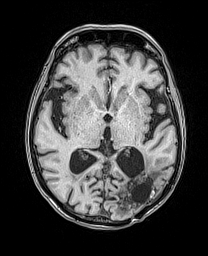
[im 100/160]
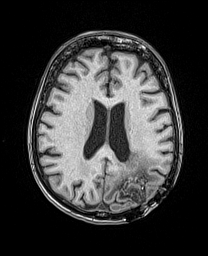
[im 120/160]
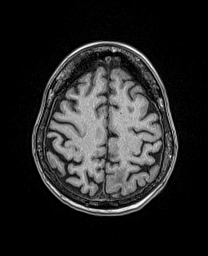
[im 140/160]
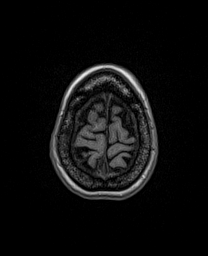
[im 160/160]
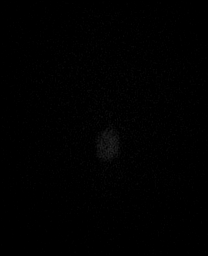

[Series 15: cor dwi_tracew · coronal · 5.0mm · 1.53mm/px · 4 of 62 slices shown]
[im 1/62]
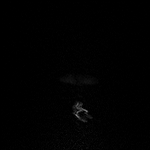
[im 21/62]
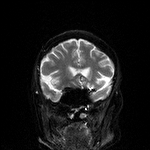
[im 41/62]
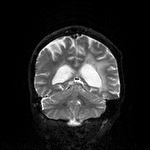
[im 62/62]
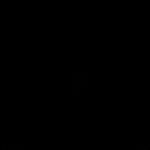

[Series 16: cor dwi_adc · coronal · 5.0mm · 1.53mm/px · 2 of 30 slices shown]
[im 1/30]
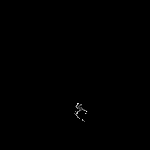
[im 30/30]
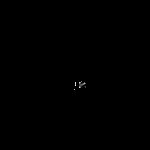

[Series 17: T2 post-contrast · coronal · 5.0mm · 0.57mm/px · 2 of 30 slices shown]
[im 1/30]
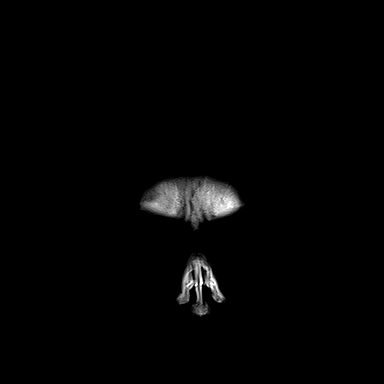
[im 30/30]
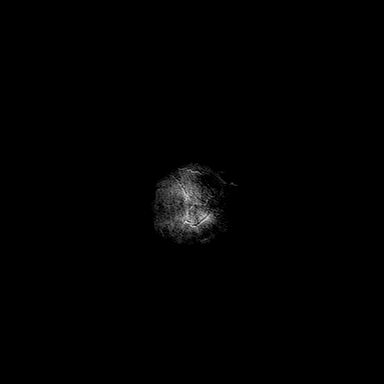

[Series 18: T1 post-contrast · axial · 1.0mm · 0.94mm/px · z∈[-95,+63]mm · 9 of 160 slices shown (1 of 3)]
[im 1/160]
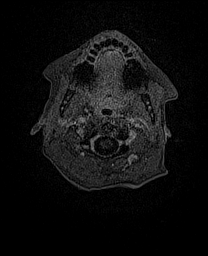
[im 20/160]
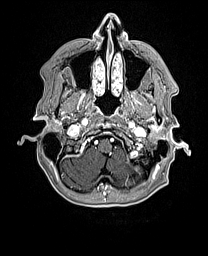
[im 40/160]
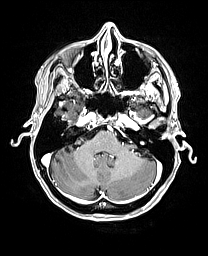
[im 60/160]
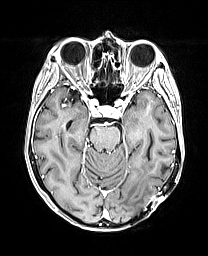
[im 80/160]
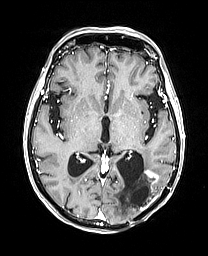
[im 100/160]
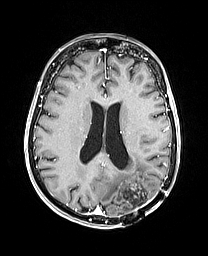
[im 120/160]
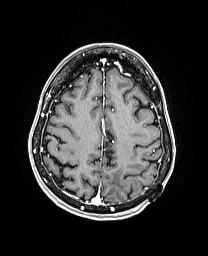
[im 140/160]
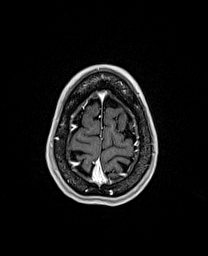
[im 160/160]
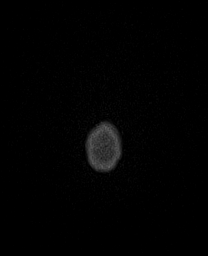

[Series 19: T1 post-contrast · coronal · 5.0mm · 0.43mm/px · 2 of 30 slices shown (2 of 3)]
[im 1/30]
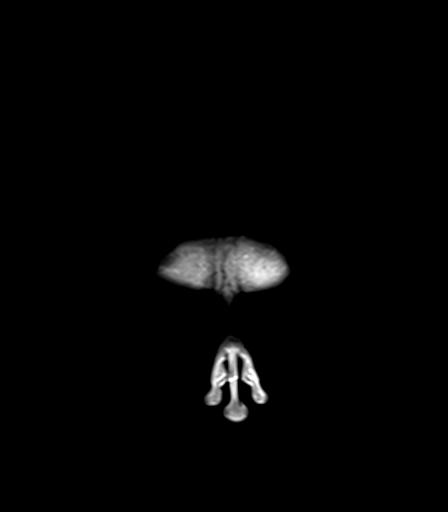
[im 30/30]
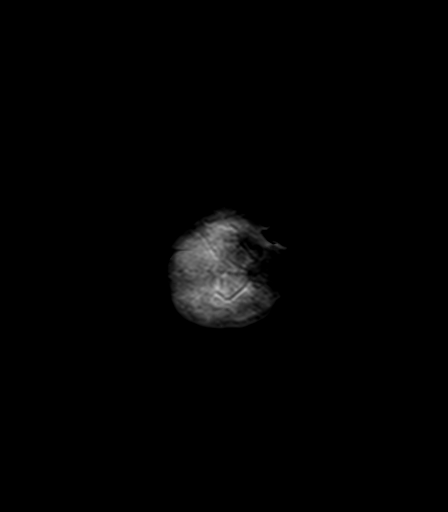

[Series 20: T1 post-contrast · sagittal · 5.0mm · 0.75mm/px · 2 of 26 slices shown (3 of 3)]
[im 1/26]
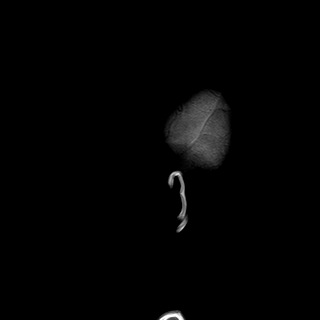
[im 26/26]
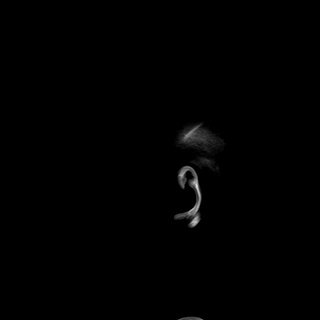

[48 of 48 positions shown; findings below may reference images not displayed]

FINDINGS: Brain: History of glioblastoma with left temporal occipital
resection. At the resection cavity residual lesion with stippled T1
hyperintense appearance is similar in extent except at the anterior
margin where there is an edge of nodular enhancement measuring up to
17 by 10 mm, previously 14 x 9 mm, progressive over scans. The area
of diffusion restriction and peripheral enhancement in the left
periatrial white matter is non progressed. No increase in T2 signal
or regional swelling. No new area of enhancement, infarct,
hydrocephalus, or collection

Vascular: Unremarkable

Skull and upper cervical spine: Unremarkable craniotomy

Sinuses/Orbits: Negative
IMPRESSION: 1. Continued progression of nodular enhancement at the anterior,
temporal margin of the resected tumor.
2. Intervally stable necrotic appearing left periatrial nodule.

## 2021-03-29 MED ORDER — GADOBUTROL 1 MMOL/ML IV SOLN
5.0000 mL | Freq: Once | INTRAVENOUS | Status: AC | PRN
  Administered 2021-03-29: 5 mL via INTRAVENOUS

## 2021-03-31 ENCOUNTER — Inpatient Hospital Stay: Payer: BC Managed Care – PPO

## 2021-04-01 ENCOUNTER — Inpatient Hospital Stay: Payer: BC Managed Care – PPO

## 2021-04-01 ENCOUNTER — Inpatient Hospital Stay (HOSPITAL_BASED_OUTPATIENT_CLINIC_OR_DEPARTMENT_OTHER): Payer: BC Managed Care – PPO | Admitting: Internal Medicine

## 2021-04-01 ENCOUNTER — Other Ambulatory Visit: Payer: Self-pay

## 2021-04-01 VITALS — BP 140/82 | HR 85 | Temp 97.8°F | Resp 15 | Ht 62.0 in | Wt 98.0 lb

## 2021-04-01 DIAGNOSIS — C719 Malignant neoplasm of brain, unspecified: Secondary | ICD-10-CM

## 2021-04-01 DIAGNOSIS — E7800 Pure hypercholesterolemia, unspecified: Secondary | ICD-10-CM | POA: Diagnosis not present

## 2021-04-01 DIAGNOSIS — Z5112 Encounter for antineoplastic immunotherapy: Secondary | ICD-10-CM | POA: Diagnosis not present

## 2021-04-01 DIAGNOSIS — D649 Anemia, unspecified: Secondary | ICD-10-CM | POA: Diagnosis not present

## 2021-04-01 DIAGNOSIS — Z9221 Personal history of antineoplastic chemotherapy: Secondary | ICD-10-CM | POA: Diagnosis not present

## 2021-04-01 DIAGNOSIS — E039 Hypothyroidism, unspecified: Secondary | ICD-10-CM | POA: Diagnosis not present

## 2021-04-01 DIAGNOSIS — C714 Malignant neoplasm of occipital lobe: Secondary | ICD-10-CM | POA: Diagnosis not present

## 2021-04-01 DIAGNOSIS — Z923 Personal history of irradiation: Secondary | ICD-10-CM | POA: Diagnosis not present

## 2021-04-01 DIAGNOSIS — Z79899 Other long term (current) drug therapy: Secondary | ICD-10-CM | POA: Diagnosis not present

## 2021-04-01 LAB — CBC WITH DIFFERENTIAL (CANCER CENTER ONLY)
Abs Immature Granulocytes: 0.02 10*3/uL (ref 0.00–0.07)
Basophils Absolute: 0 10*3/uL (ref 0.0–0.1)
Basophils Relative: 1 %
Eosinophils Absolute: 0.4 10*3/uL (ref 0.0–0.5)
Eosinophils Relative: 6 %
HCT: 27.5 % — ABNORMAL LOW (ref 36.0–46.0)
Hemoglobin: 9 g/dL — ABNORMAL LOW (ref 12.0–15.0)
Immature Granulocytes: 0 %
Lymphocytes Relative: 14 %
Lymphs Abs: 0.8 10*3/uL (ref 0.7–4.0)
MCH: 32.5 pg (ref 26.0–34.0)
MCHC: 32.7 g/dL (ref 30.0–36.0)
MCV: 99.3 fL (ref 80.0–100.0)
Monocytes Absolute: 0.6 10*3/uL (ref 0.1–1.0)
Monocytes Relative: 11 %
Neutro Abs: 3.8 10*3/uL (ref 1.7–7.7)
Neutrophils Relative %: 68 %
Platelet Count: 118 10*3/uL — ABNORMAL LOW (ref 150–400)
RBC: 2.77 MIL/uL — ABNORMAL LOW (ref 3.87–5.11)
RDW: 12.4 % (ref 11.5–15.5)
WBC Count: 5.6 10*3/uL (ref 4.0–10.5)
nRBC: 0 % (ref 0.0–0.2)

## 2021-04-01 LAB — CMP (CANCER CENTER ONLY)
ALT: 21 U/L (ref 0–44)
AST: 28 U/L (ref 15–41)
Albumin: 3.6 g/dL (ref 3.5–5.0)
Alkaline Phosphatase: 128 U/L — ABNORMAL HIGH (ref 38–126)
Anion gap: 7 (ref 5–15)
BUN: 44 mg/dL — ABNORMAL HIGH (ref 8–23)
CO2: 26 mmol/L (ref 22–32)
Calcium: 9 mg/dL (ref 8.9–10.3)
Chloride: 105 mmol/L (ref 98–111)
Creatinine: 1.87 mg/dL — ABNORMAL HIGH (ref 0.44–1.00)
GFR, Estimated: 29 mL/min — ABNORMAL LOW (ref >60)
Glucose, Bld: 62 mg/dL — ABNORMAL LOW (ref 70–99)
Potassium: 4.7 mmol/L (ref 3.5–5.1)
Sodium: 138 mmol/L (ref 135–145)
Total Bilirubin: 0.4 mg/dL (ref 0.3–1.2)
Total Protein: 6.2 g/dL — ABNORMAL LOW (ref 6.5–8.1)

## 2021-04-01 LAB — TOTAL PROTEIN, URINE DIPSTICK: Protein, ur: >2000 mg/dL — AB

## 2021-04-01 NOTE — Progress Notes (Signed)
? ?[facility] [address]  ?[place], [place] [address] ?[phone] ? ? ?Interval Evaluation ? ?Date of Service: 04/01/21 ?Patient Name: Jillian Gomez ?Patient MRN: [id number] ?Patient DOB: March 18, 1955 ?Provider: Ventura Sellers, MD ? ?Identifying Statement:  ?Jillian Gomez is a 66 y.o. female with left occipital glioblastoma  ? ?Oncologic History: ?Oncology History  ?Glioblastoma with isocitrate dehydrogenase gene wildtype ([facility])  ?12/23/2018 Surgery  ? Craniotomy, left occipital resection by Dr. Kathyrn Sheriff.  Path is GBM IDH-wt ?  ?01/23/2019 - 03/03/2019 Radiation Therapy  ? IMRT with concurrent Temozolomide 72m/m2 ?  ?03/28/2019 - 09/04/2019 Chemotherapy  ? 5 cycles of adjuvant 5-day Temozolomide 2024mm2 ? ?  ?09/01/2019 Progression  ? Progression of disease #1 ?  ?09/19/2019 -  Chemotherapy  ? Begins second line therapy with oral CCNU 9071m2 q6 weeks, concurrent Avastin 71m68m IV q2 weeks ?  ?04/30/2020 -  Chemotherapy  ? Completes 4th cycle of CCNU/Avastin, transitions to Avastin monotherapy due to cytopenias ?  ?08/27/2020 -  Chemotherapy  ? Resumes CCNU+Avastin after periventricular DWI signal change noted on MRI ? ?  ?12/19/2020 -  Chemotherapy  ? Patient is on Treatment Plan : BRAIN GLIOBLASTOMA Consolidation Temozolomide Days 1-5 q28 Days   ?   ? ? ?Biomarkers: ? ?MGMT Unknown.  ?IDH 1/2 Wild type.  ?EGFR Unknown  ?TERT Unknown  ? ?Interval History: ? Jillian SZELIGAsents today for visit following recent MRI brain. No new or progressive complaints today.  No further issues with the metronomic Temodar. Otherwise no new or progressive neurologic complaints.  No right sided weakness. No seizures or headaches. ? ?H+P (01/09/19) Patient presented to medical attention in early December with ~2 months history of right sided visual impairment.  She describes fuzzy or blurry vision on that side which was progressive over time, leading to at least one fall.  MRI brain demonstrated  enhancing left occipital mass; this was subsequently resected by Dr. NundKathyrn Sheriff12/18/20.  She had no issues with surgery and has completed her steroid taper.  Continues on keppra daily but no history of any seizure. She has returned to work with only modest difficutly.  No issues walking or performing ADLs; lives alone but her sister and other family members are nearby. ? ?Medications: ?Current Outpatient Medications on File Prior to Visit  ?Medication Sig Dispense Refill  ? amLODipine (NORVASC) 2.5 MG tablet Take 1 tablet (2.5 mg total) by mouth daily. 90 tablet 1  ? Biotin 1 MG CAPS Take 1 mg by mouth daily.    ? CALCIUM PO Take 1 tablet by mouth daily.    ? doxycycline (VIBRA-TABS) 100 MG tablet Take 1 tablet by mouth daily. (Patient not taking: Reported on 11/05/2020)    ? levothyroxine (SYNTHROID) 75 MCG tablet Take 75 mcg by mouth daily.    ? naproxen sodium (ALEVE) 220 MG tablet Take 1 tablet (220 mg total) by mouth daily as needed (pain). (Patient not taking: Reported on 03/18/2021)    ? omeprazole (PRILOSEC) 20 MG capsule Take 1 capsule (20 mg total) by mouth daily. 30 capsule 3  ? ondansetron (ZOFRAN) 8 MG tablet Take 1 tablet (8 mg total) by mouth 2 (two) times daily as needed (nausea and vomiting). May take 30-60 minutes prior to Temodar administration if nausea/vomiting occurs. 30 tablet 1  ? rosuvastatin (CRESTOR) 10 MG tablet Take 5 mg by mouth at bedtime.    ? temozolomide (TEMODAR) 20 MG capsule Take 3 capsules (  60 mg total) by mouth daily. May take on an empty stomach to decrease nausea & vomiting. 84 capsule 0  ? temozolomide (TEMODAR) 5 MG capsule Take 2 capsules (10 mg total) by mouth daily. May take on an empty stomach to decrease nausea & vomiting. 56 capsule 0  ? ?No current facility-administered medications on file prior to visit.  ? ? ?Allergies:  ?Allergies  ?Allergen Reactions  ? Etodolac Other (See Comments)  ?  GI upset  ? Macrobid [Nitrofurantoin] Rash  ? ?Past Medical History:  ?Past  Medical History:  ?Diagnosis Date  ? High cholesterol   ? Hypothyroidism   ? Joint pain   ? Thrombocytopenia ([facility]) 02/26/2020  ? ?Past Surgical History:  ?Past Surgical History:  ?Procedure Laterality Date  ? APPLICATION OF CRANIAL NAVIGATION Left 12/23/2018  ? Procedure: APPLICATION OF CRANIAL NAVIGATION;  Surgeon: Consuella Lose, MD;  Location: [facility];  Service: Neurosurgery;  Laterality: Left;  APPLICATION OF CRANIAL NAVIGATION  ? BREAST BIOPSY Left   ? COLONOSCOPY    ? CRANIOTOMY Left 12/23/2018  ? Procedure: STEREOTACTIC LEFT OCCIPITAL CRANIOTOMY FOR RESECTION OF TUMOR;  Surgeon: Consuella Lose, MD;  Location: [facility];  Service: Neurosurgery;  Laterality: Left;  STEREOTACTIC LEFT OCCIPITAL CRANIOTOMY FOR RESECTION OF TUMOR  ? LASIK    ? ?Social History:  ?Social History  ? ?Socioeconomic History  ? Marital status: Single  ?  Spouse name: Not on file  ? Number of children: Not on file  ? Years of education: Not on file  ? Highest education level: Not on file  ?Occupational History  ? Not on file  ?Tobacco Use  ? Smoking status: Never  ? Smokeless tobacco: Never  ?Vaping Use  ? Vaping Use: Never used  ?Substance and Sexual Activity  ? Alcohol use: Not Currently  ? Drug use: Never  ? Sexual activity: Not on file  ?Other Topics Concern  ? Not on file  ?Social History Narrative  ? Not on file  ? ?Social Determinants of Health  ? ?Financial Resource Strain: Not on file  ?Food Insecurity: Not on file  ?Transportation Needs: Not on file  ?Physical Activity: Not on file  ?Stress: Not on file  ?Social Connections: Not on file  ?Intimate Partner Violence: Not on file  ? ?Family History: No family history on file. ? ?Review of Systems: ?Constitutional: Denies fevers, chills or abnormal weight loss ?Eyes: Denies blurriness of vision ?Gastrointestinal:  Denies nausea, constipation, diarrhea ?GU: Denies dysuria or incontinence ?Skin: Denies abnormal skin rashes ?Musculoskeletal: Denies joint pain, back or neck discomfort.   ?Behavioral/Psych: Denies anxiety, mood instability ? ?Physical Exam: ?Vitals:  ? 04/01/21 1242  ?BP: 140/82  ?Pulse: 85  ?Resp: 15  ?Temp: 97.8 ?F (36.6 ?C)  ?SpO2: 100%  ? ?KPS: 80. ?General: Alert, cooperative, pleasant, in no acute distress ?Head: Craniotomy scar noted, dry and intact. ?EENT: No conjunctival injection or scleral icterus. Oral mucosa moist ?Lungs: Resp effort normal ?Cardiac: Regular rate and rhythm ?Abdomen: Soft, non-distended abdomen ?Skin: No rashes cyanosis or petechiae. ?Extremities: Left shin skin level cut, bleeding ? ?Neurologic Exam: ?Mental Status: Awake, alert, attentive to examiner. Oriented to self and environment. Language is notable for mild transcortical expressive dyshpasia. Cranial Nerves: Visual acuity is grossly normal. Right homonymous hemianopia. Extra-ocular movements intact. No ptosis. Face is symmetric, tongue midline. ?Motor: Tone and bulk are normal. Power is full in both arms and legs. Reflexes are symmetric, no pathologic reflexes present. Intact finger to nose bilaterally ?Sensory: Intact  to light touch and temperature ?Gait: Normal and tandem gait is deferred. ? ? ?Labs: ?I have reviewed the data as listed ?   ?Component Value Date/Time  ? NA 136 03/18/2021 0854  ? K 4.1 03/18/2021 0854  ? CL 104 03/18/2021 0854  ? CO2 23 03/18/2021 0854  ? GLUCOSE 78 03/18/2021 0854  ? BUN 38 (H) 03/18/2021 0854  ? CREATININE 1.86 (H) 03/18/2021 0854  ? CALCIUM 9.1 03/18/2021 0854  ? PROT 5.9 (L) 03/18/2021 0854  ? ALBUMIN 3.6 03/18/2021 0854  ? AST 27 03/18/2021 0854  ? ALT 21 03/18/2021 0854  ? ALKPHOS 100 03/18/2021 0854  ? BILITOT 0.6 03/18/2021 0854  ? GFRNONAA 30 (L) 03/18/2021 0854  ? GFRAA >60 10/03/2019 1133  ? ?Lab Results  ?Component Value Date  ? WBC 5.6 04/01/2021  ? NEUTROABS 3.8 04/01/2021  ? HGB 9.0 (L) 04/01/2021  ? HCT 27.5 (L) 04/01/2021  ? MCV 99.3 04/01/2021  ? PLT 118 (L) 04/01/2021  ? ?Imaging: ? ?[facility] Clinician Interpretation: I have personally reviewed  the CNS images as listed.  My interpretation, in the context of the patient's clinical presentation, is progressive disease ? ?MR BRAIN W WO CONTRAST ? ?Result Date: 03/30/2021 ?CLINICAL DATA:  CNS neo

## 2021-04-01 NOTE — Progress Notes (Signed)
DISCONTINUE ON PATHWAY REGIMEN - Neuro ? ? ?  A cycle is every 28 days: ?    Temozolomide  ?    Temozolomide  ? ?**Always confirm dose/schedule in your pharmacy ordering system** ? ?REASON: Disease Progression ?PRIOR TREATMENT: [id number]: Temozolomide 150/200 mg/m2 PO D1-5 q28 Days ?TREATMENT RESPONSE: Progressive Disease (PD) ? ?START OFF PATHWAY REGIMEN - Neuro ? ? ?[id number]:Irinotecan 180 mg/m2 q14 Days: ?  A cycle is every 14 days: ?    Irinotecan  ? ?**Always confirm dose/schedule in your pharmacy ordering system** ? ?Patient Characteristics: ?Glioma, Glioblastoma, IDH-wildtype, Recurrent or Progressive, Nonsurgical Candidate, Systemic Therapy Candidate, BRAF V600E Mutation Negative/Unknown and NTRK Fusion Negative/Unknown ?Disease Classification: Glioma ?Disease Classification: Glioblastoma, IDH-wildtype ?Disease Status: Recurrent or Progressive ?Treatment Classification: Nonsurgical Candidate ?Treatment (Nonsurgical/Adjuvant): Systemic Therapy Candidate ?NTRK Gene Fusion Status: Awaiting Test Results ?BRAF V600E Mutation Status: Awaiting Test Results ?Intent of Therapy: ?Non-Curative / Palliative Intent, Discussed with Patient ?

## 2021-04-02 ENCOUNTER — Telehealth: Payer: Self-pay | Admitting: Internal Medicine

## 2021-04-02 ENCOUNTER — Other Ambulatory Visit (HOSPITAL_COMMUNITY): Payer: Self-pay

## 2021-04-02 NOTE — Telephone Encounter (Signed)
Scheduled per 3/28 los, pt has been called and confirmed  ?

## 2021-04-08 ENCOUNTER — Inpatient Hospital Stay (HOSPITAL_BASED_OUTPATIENT_CLINIC_OR_DEPARTMENT_OTHER): Payer: BC Managed Care – PPO | Admitting: Internal Medicine

## 2021-04-08 ENCOUNTER — Inpatient Hospital Stay: Payer: BC Managed Care – PPO

## 2021-04-08 ENCOUNTER — Other Ambulatory Visit: Payer: Self-pay

## 2021-04-08 ENCOUNTER — Inpatient Hospital Stay: Payer: BC Managed Care – PPO | Attending: Internal Medicine

## 2021-04-08 ENCOUNTER — Telehealth: Payer: Self-pay

## 2021-04-08 VITALS — BP 146/68 | HR 68 | Resp 14

## 2021-04-08 VITALS — BP 136/77 | HR 89 | Temp 97.7°F | Resp 18 | Wt 96.8 lb

## 2021-04-08 DIAGNOSIS — Z923 Personal history of irradiation: Secondary | ICD-10-CM | POA: Insufficient documentation

## 2021-04-08 DIAGNOSIS — Z9221 Personal history of antineoplastic chemotherapy: Secondary | ICD-10-CM | POA: Insufficient documentation

## 2021-04-08 DIAGNOSIS — Z79899 Other long term (current) drug therapy: Secondary | ICD-10-CM | POA: Insufficient documentation

## 2021-04-08 DIAGNOSIS — D696 Thrombocytopenia, unspecified: Secondary | ICD-10-CM | POA: Diagnosis not present

## 2021-04-08 DIAGNOSIS — C719 Malignant neoplasm of brain, unspecified: Secondary | ICD-10-CM

## 2021-04-08 DIAGNOSIS — Z5111 Encounter for antineoplastic chemotherapy: Secondary | ICD-10-CM | POA: Diagnosis not present

## 2021-04-08 DIAGNOSIS — C714 Malignant neoplasm of occipital lobe: Secondary | ICD-10-CM | POA: Diagnosis not present

## 2021-04-08 DIAGNOSIS — R809 Proteinuria, unspecified: Secondary | ICD-10-CM | POA: Insufficient documentation

## 2021-04-08 DIAGNOSIS — D649 Anemia, unspecified: Secondary | ICD-10-CM | POA: Diagnosis not present

## 2021-04-08 DIAGNOSIS — Z5112 Encounter for antineoplastic immunotherapy: Secondary | ICD-10-CM | POA: Diagnosis not present

## 2021-04-08 LAB — CMP (CANCER CENTER ONLY)
ALT: 20 U/L (ref 0–44)
AST: 26 U/L (ref 15–41)
Albumin: 3.6 g/dL (ref 3.5–5.0)
Alkaline Phosphatase: 147 U/L — ABNORMAL HIGH (ref 38–126)
Anion gap: 7 (ref 5–15)
BUN: 44 mg/dL — ABNORMAL HIGH (ref 8–23)
CO2: 24 mmol/L (ref 22–32)
Calcium: 9 mg/dL (ref 8.9–10.3)
Chloride: 107 mmol/L (ref 98–111)
Creatinine: 1.5 mg/dL — ABNORMAL HIGH (ref 0.44–1.00)
GFR, Estimated: 38 mL/min — ABNORMAL LOW (ref >60)
Glucose, Bld: 77 mg/dL (ref 70–99)
Potassium: 4.2 mmol/L (ref 3.5–5.1)
Sodium: 138 mmol/L (ref 135–145)
Total Bilirubin: 0.5 mg/dL (ref 0.3–1.2)
Total Protein: 6.5 g/dL (ref 6.5–8.1)

## 2021-04-08 LAB — CBC WITH DIFFERENTIAL (CANCER CENTER ONLY)
Abs Immature Granulocytes: 0.01 10*3/uL (ref 0.00–0.07)
Basophils Absolute: 0 10*3/uL (ref 0.0–0.1)
Basophils Relative: 1 %
Eosinophils Absolute: 0.4 10*3/uL (ref 0.0–0.5)
Eosinophils Relative: 8 %
HCT: 25.9 % — ABNORMAL LOW (ref 36.0–46.0)
Hemoglobin: 8.7 g/dL — ABNORMAL LOW (ref 12.0–15.0)
Immature Granulocytes: 0 %
Lymphocytes Relative: 13 %
Lymphs Abs: 0.7 10*3/uL (ref 0.7–4.0)
MCH: 32.6 pg (ref 26.0–34.0)
MCHC: 33.6 g/dL (ref 30.0–36.0)
MCV: 97 fL (ref 80.0–100.0)
Monocytes Absolute: 0.6 10*3/uL (ref 0.1–1.0)
Monocytes Relative: 12 %
Neutro Abs: 3.4 10*3/uL (ref 1.7–7.7)
Neutrophils Relative %: 66 %
Platelet Count: 109 10*3/uL — ABNORMAL LOW (ref 150–400)
RBC: 2.67 MIL/uL — ABNORMAL LOW (ref 3.87–5.11)
RDW: 12.3 % (ref 11.5–15.5)
WBC Count: 5.2 10*3/uL (ref 4.0–10.5)
nRBC: 0 % (ref 0.0–0.2)

## 2021-04-08 LAB — TOTAL PROTEIN, URINE DIPSTICK: Protein, ur: >2000 mg/dL — AB

## 2021-04-08 MED ORDER — ATROPINE SULFATE 1 MG/ML IV SOLN
1.0000 mg | Freq: Once | INTRAVENOUS | Status: AC | PRN
  Administered 2021-04-08: 1 mg via INTRAVENOUS
  Filled 2021-04-08: qty 1

## 2021-04-08 MED ORDER — SODIUM CHLORIDE 0.9 % IV SOLN
10.0000 mg | Freq: Once | INTRAVENOUS | Status: AC
  Administered 2021-04-08: 10 mg via INTRAVENOUS
  Filled 2021-04-08: qty 10

## 2021-04-08 MED ORDER — PALONOSETRON HCL INJECTION 0.25 MG/5ML
0.2500 mg | Freq: Once | INTRAVENOUS | Status: AC
  Administered 2021-04-08: 0.25 mg via INTRAVENOUS
  Filled 2021-04-08: qty 5

## 2021-04-08 MED ORDER — SODIUM CHLORIDE 0.9 % IV SOLN: Freq: Once | INTRAVENOUS | Status: AC

## 2021-04-08 MED ORDER — HEPARIN SOD (PORK) LOCK FLUSH 100 UNIT/ML IV SOLN: 500.0000 [IU] | Freq: Once | INTRAVENOUS | Status: DC | PRN

## 2021-04-08 MED ORDER — SODIUM CHLORIDE 0.9 % IV SOLN
125.0000 mg/m2 | Freq: Once | INTRAVENOUS | Status: AC
  Administered 2021-04-08: 180 mg via INTRAVENOUS
  Filled 2021-04-08: qty 9

## 2021-04-08 MED ORDER — SODIUM CHLORIDE 0.9% FLUSH: 10.0000 mL | INTRAVENOUS | Status: DC | PRN

## 2021-04-08 NOTE — Progress Notes (Signed)
Per Dr.Vaslow, pt will receive treatment today with currents labs, and vitals but will not receive Avastin.   ?

## 2021-04-08 NOTE — Telephone Encounter (Signed)
Pt's sister Jeannene Patella called with concerns regarding the treatment schedule on 04/22/2021. Pt is getting Avastin on 04/15/2021 if labs are good. She was concerned with the next treatment on 4/18 only being a week out. I explained that since that is only 1 week apart that this will need to be fixed and for her to look out for an updated schedule. She verbalized understanding. She and the pt knows they can call back [place] office with anymore concerns or questions.  ?

## 2021-04-08 NOTE — Progress Notes (Signed)
? ?[facility] at [address]  ?[place], [place] [address] ?[phone] ? ? ?Interval Evaluation ? ?Date of Service: 04/08/21 ?Patient Name: Jillian Gomez ?Patient MRN: [id number] ?Patient DOB: September 07, 1955 ?Provider: Ventura Sellers, MD ? ?Identifying Statement:  ?Jillian Gomez is a 66 y.o. female with left occipital glioblastoma  ? ?Oncologic History: ?Oncology History  ?Glioblastoma with isocitrate dehydrogenase gene wildtype ([facility])  ?12/23/2018 Surgery  ? Craniotomy, left occipital resection by Dr. Kathyrn Sheriff.  Path is GBM IDH-wt ?  ?01/23/2019 - 03/03/2019 Radiation Therapy  ? IMRT with concurrent Temozolomide 67m/m2 ?  ?03/28/2019 - 09/04/2019 Chemotherapy  ? 5 cycles of adjuvant 5-day Temozolomide 2028mm2 ? ?  ?09/01/2019 Progression  ? Progression of disease #1 ?  ?09/19/2019 -  Chemotherapy  ? Begins second line therapy with oral CCNU 9060m2 q6 weeks, concurrent Avastin 28m65m IV q2 weeks ?  ?04/30/2020 -  Chemotherapy  ? Completes 4th cycle of CCNU/Avastin, transitions to Avastin monotherapy due to cytopenias ?  ?08/27/2020 -  Chemotherapy  ? Resumes CCNU+Avastin after periventricular DWI signal change noted on MRI ? ?  ?12/14/2020 Progression  ? Progression of disease #2 ?  ?12/19/2020 - 04/01/2021 Chemotherapy  ? Initiates daily metronomic Temodar 50mg44mwith Avastin 28mg/61m2 weeks ?  ?03/30/2021 Progression  ? Progression of disease #3 ?  ?04/08/2021 -  Chemotherapy  ? Patient is on Treatment Plan : BRAIN GBM [facility] Irinotecan + Bevacizumab D1,15 q 28d  ?   ? ? ?Biomarkers: ? ?MGMT Unknown.  ?IDH 1/2 Wild type.  ?EGFR Unknown  ?TERT Unknown  ? ?Interval History: ? Jillian AMIRIA ORRISONnts today for initial irinotecan infusion.  Denies new or progressive changes.  No right sided weakness. No seizures or headaches. ? ?H+P (01/09/19) Patient presented to medical attention in early December with ~2 months history of right sided visual impairment.  She describes fuzzy or blurry vision  on that side which was progressive over time, leading to at least one fall.  MRI brain demonstrated enhancing left occipital mass; this was subsequently resected by Dr. NundkuKathyrn Sheriff/18/20.  She had no issues with surgery and has completed her steroid taper.  Continues on keppra daily but no history of any seizure. She has returned to work with only modest difficutly.  No issues walking or performing ADLs; lives alone but her sister and other family members are nearby. ? ?Medications: ?Current Outpatient Medications on File Prior to Visit  ?Medication Sig Dispense Refill  ? amLODipine (NORVASC) 2.5 MG tablet Take 1 tablet (2.5 mg total) by mouth daily. 90 tablet 1  ? Biotin 1 MG CAPS Take 1 mg by mouth daily.    ? CALCIUM PO Take 1 tablet by mouth daily.    ? doxycycline (VIBRA-TABS) 100 MG tablet Take 1 tablet by mouth daily. (Patient not taking: Reported on 11/05/2020)    ? levothyroxine (SYNTHROID) 75 MCG tablet Take 75 mcg by mouth daily.    ? naproxen sodium (ALEVE) 220 MG tablet Take 1 tablet (220 mg total) by mouth daily as needed (pain). (Patient not taking: Reported on 03/18/2021)    ? omeprazole (PRILOSEC) 20 MG capsule Take 1 capsule (20 mg total) by mouth daily. 30 capsule 3  ? rosuvastatin (CRESTOR) 10 MG tablet Take 5 mg by mouth at bedtime.    ? ?No current facility-administered medications on file prior to visit.  ? ? ?Allergies:  ?Allergies  ?Allergen Reactions  ? Etodolac Other (See Comments)  ?  GI upset  ? Macrobid [Nitrofurantoin] Rash  ? ?Past Medical History:  ?Past Medical History:  ?Diagnosis Date  ? High cholesterol   ? Hypothyroidism   ? Joint pain   ? Thrombocytopenia ([facility]) 02/26/2020  ? ?Past Surgical History:  ?Past Surgical History:  ?Procedure Laterality Date  ? APPLICATION OF CRANIAL NAVIGATION Left 12/23/2018  ? Procedure: APPLICATION OF CRANIAL NAVIGATION;  Surgeon: Nundkumar, Neelesh, MD;  Location: [facility];  Service: Neurosurgery;  Laterality: Left;  APPLICATION OF CRANIAL  NAVIGATION  ? BREAST BIOPSY Left   ? COLONOSCOPY    ? CRANIOTOMY Left 12/23/2018  ? Procedure: STEREOTACTIC LEFT OCCIPITAL CRANIOTOMY FOR RESECTION OF TUMOR;  Surgeon: Nundkumar, Neelesh, MD;  Location: [facility];  Service: Neurosurgery;  Laterality: Left;  STEREOTACTIC LEFT OCCIPITAL CRANIOTOMY FOR RESECTION OF TUMOR  ? LASIK    ? ?Social History:  ?Social History  ? ?Socioeconomic History  ? Marital status: Single  ?  Spouse name: Not on file  ? Number of children: Not on file  ? Years of education: Not on file  ? Highest education level: Not on file  ?Occupational History  ? Not on file  ?Tobacco Use  ? Smoking status: Never  ? Smokeless tobacco: Never  ?Vaping Use  ? Vaping Use: Never used  ?Substance and Sexual Activity  ? Alcohol use: Not Currently  ? Drug use: Never  ? Sexual activity: Not on file  ?Other Topics Concern  ? Not on file  ?Social History Narrative  ? Not on file  ? ?Social Determinants of Health  ? ?Financial Resource Strain: Not on file  ?Food Insecurity: Not on file  ?Transportation Needs: Not on file  ?Physical Activity: Not on file  ?Stress: Not on file  ?Social Connections: Not on file  ?Intimate Partner Violence: Not on file  ? ?Family History: No family history on file. ? ?Review of Systems: ?Constitutional: Denies fevers, chills or abnormal weight loss ?Eyes: Denies blurriness of vision ?Gastrointestinal:  Denies nausea, constipation, diarrhea ?GU: Denies dysuria or incontinence ?Skin: Denies abnormal skin rashes ?Musculoskeletal: Denies joint pain, back or neck discomfort.  ?Behavioral/Psych: Denies anxiety, mood instability ? ?Physical Exam: ?Vitals:  ? 04/08/21 1052  ?Pulse: 89  ?Resp: 18  ?Temp: 97.7 ?F (36.5 ?C)  ?SpO2: 100%  ? ?KPS: 80. ?General: Alert, cooperative, pleasant, in no acute distress ?Head: Craniotomy scar noted, dry and intact. ?EENT: No conjunctival injection or scleral icterus. Oral mucosa moist ?Lungs: Resp effort normal ?Cardiac: Regular rate and rhythm ?Abdomen:  Soft, non-distended abdomen ?Skin: No rashes cyanosis or petechiae. ?Extremities: Left shin skin level cut, bleeding ? ?Neurologic Exam: ?Mental Status: Awake, alert, attentive to examiner. Oriented to self and environment. Language is notable for mild transcortical expressive dyshpasia. Cranial Nerves: Visual acuity is grossly normal. Right homonymous hemianopia. Extra-ocular movements intact. No ptosis. Face is symmetric, tongue midline. ?Motor: Tone and bulk are normal. Power is full in both arms and legs. Reflexes are symmetric, no pathologic reflexes present. Intact finger to nose bilaterally ?Sensory: Intact to light touch and temperature ?Gait: Normal and tandem gait is deferred. ? ? ?Labs: ?I have reviewed the data as listed ?   ?Component Value Date/Time  ? NA 138 04/01/2021 1208  ? K 4.7 04/01/2021 1208  ? CL 105 04/01/2021 1208  ? CO2 26 04/01/2021 1208  ? GLUCOSE 62 (L) 04/01/2021 1208  ? BUN 44 (H) 04/01/2021 1208  ? CREATININE 1.87 (H) 04/01/2021 1208  ? CALCIUM 9.0 04/01/2021 1208  ? PROT   6.2 (L) 04/01/2021 1208  ? ALBUMIN 3.6 04/01/2021 1208  ? AST 28 04/01/2021 1208  ? ALT 21 04/01/2021 1208  ? ALKPHOS 128 (H) 04/01/2021 1208  ? BILITOT 0.4 04/01/2021 1208  ? GFRNONAA 29 (L) 04/01/2021 1208  ? GFRAA >60 10/03/2019 1133  ? ?Lab Results  ?Component Value Date  ? WBC 5.2 04/08/2021  ? NEUTROABS 3.4 04/08/2021  ? HGB 8.7 (L) 04/08/2021  ? HCT 25.9 (L) 04/08/2021  ? MCV 97.0 04/08/2021  ? PLT 109 (L) 04/08/2021  ? ?Imaging: ? ?[facility] Clinician Interpretation: I have personally reviewed the CNS images as listed.  My interpretation, in the context of the patient's clinical presentation, is progressive disease ? ?MR BRAIN W WO CONTRAST ? ?Result Date: 03/30/2021 ?CLINICAL DATA:  CNS neoplasm, assess treatment response EXAM: MRI HEAD WITHOUT AND WITH CONTRAST TECHNIQUE: Multiplanar, multiecho pulse sequences of the brain and surrounding structures were obtained without and with intravenous contrast. CONTRAST:   5mL GADAVIST GADOBUTROL 1 MMOL/ML IV SOLN COMPARISON:  02/21/2021 FINDINGS: Brain: History of glioblastoma with left temporal occipital resection. At the resection cavity residual lesion with stippled T1 hyperinte

## 2021-04-08 NOTE — Patient Instructions (Signed)
[facility]  Discharge Instructions: ?Thank you for choosing [facility] to provide your oncology and hematology care.  ? ?If you have a lab appointment with the Cancer Center, please go directly to the Cancer Center and check in at the registration area. ?  ?Wear comfortable clothing and clothing appropriate for easy access to any Portacath or PICC line.  ? ?We strive to give you quality time with your provider. You may need to reschedule your appointment if you arrive late (15 or more minutes).  Arriving late affects you and other patients whose appointments are after yours.  Also, if you miss three or more appointments without notifying the office, you may be dismissed from the clinic at the provider?s discretion.    ?  ?For prescription refill requests, have your pharmacy contact our office and allow 72 hours for refills to be completed.   ? ?Today you received the following chemotherapy and/or immunotherapy agents: Irinotecan    ?  ?To help prevent nausea and vomiting after your treatment, we encourage you to take your nausea medication as directed. ? ?BELOW ARE SYMPTOMS THAT SHOULD BE REPORTED IMMEDIATELY: ?*FEVER GREATER THAN 100.4 F (38 ?C) OR HIGHER ?*CHILLS OR SWEATING ?*NAUSEA AND VOMITING THAT IS NOT CONTROLLED WITH YOUR NAUSEA MEDICATION ?*UNUSUAL SHORTNESS OF BREATH ?*UNUSUAL BRUISING OR BLEEDING ?*URINARY PROBLEMS (pain or burning when urinating, or frequent urination) ?*BOWEL PROBLEMS (unusual diarrhea, constipation, pain near the anus) ?TENDERNESS IN MOUTH AND THROAT WITH OR WITHOUT PRESENCE OF ULCERS (sore throat, sores in mouth, or a toothache) ?UNUSUAL RASH, SWELLING OR PAIN  ?UNUSUAL VAGINAL DISCHARGE OR ITCHING  ? ?Items with * indicate a potential emergency and should be followed up as soon as possible or go to the Emergency Department if any problems should occur. ? ?Please show the CHEMOTHERAPY ALERT CARD or IMMUNOTHERAPY ALERT CARD at check-in to  the Emergency Department and triage nurse. ? ?Should you have questions after your visit or need to cancel or reschedule your appointment, please contact [facility]  Dept: [phone]  and follow the prompts.  Office hours are 8:00 a.m. to 4:30 p.m. Monday - Friday. Please note that voicemails left after 4:00 p.m. may not be returned until the following business day.  We are closed weekends and major holidays. You have access to a nurse at all times for urgent questions. Please call the main number to the clinic Dept: [phone] and follow the prompts. ? ? ?For any non-urgent questions, you may also contact your provider using MyChart. We now offer e-Visits for anyone 18 and older to request care online for non-urgent symptoms. For details visit [email or web address]. ?  ?Also download the MyChart app! Go to the app store, search "MyChart", open the app, select , and log in with your MyChart username and password. ? ?Due to Covid, a mask is required upon entering the hospital/clinic. If you do not have a mask, one will be given to you upon arrival. For doctor visits, patients may have 1 support person aged 18 or older with them. For treatment visits, patients cannot have anyone with them due to current Covid guidelines and our immunocompromised population.  ? ?

## 2021-04-09 ENCOUNTER — Telehealth: Payer: Self-pay | Admitting: *Deleted

## 2021-04-09 NOTE — Telephone Encounter (Signed)
Called pt to see how she did with her treatment yesterday.  Left message to return call.   ?

## 2021-04-09 NOTE — Telephone Encounter (Signed)
-----   Message from Willis Modena, RN sent at 04/08/2021  3:07 PM EDT ----- ?Regarding: Dr. Mickeal Skinner 1st time Irinotecan f/u tol well ?Pt completed Irinotecan inf, tolerated well. Pt call back 04/09/21 ? ?

## 2021-04-10 ENCOUNTER — Other Ambulatory Visit: Payer: Self-pay | Admitting: Internal Medicine

## 2021-04-11 ENCOUNTER — Telehealth: Payer: Self-pay | Admitting: Internal Medicine

## 2021-04-11 NOTE — Telephone Encounter (Signed)
Call to remind pt about upcoming appts, left message about next few appts ?

## 2021-04-15 ENCOUNTER — Other Ambulatory Visit: Payer: Self-pay

## 2021-04-15 ENCOUNTER — Inpatient Hospital Stay: Payer: BC Managed Care – PPO

## 2021-04-15 ENCOUNTER — Inpatient Hospital Stay (HOSPITAL_BASED_OUTPATIENT_CLINIC_OR_DEPARTMENT_OTHER): Payer: BC Managed Care – PPO | Admitting: Internal Medicine

## 2021-04-15 ENCOUNTER — Ambulatory Visit: Payer: BC Managed Care – PPO

## 2021-04-15 ENCOUNTER — Ambulatory Visit: Payer: BC Managed Care – PPO | Admitting: Internal Medicine

## 2021-04-15 ENCOUNTER — Other Ambulatory Visit: Payer: BC Managed Care – PPO

## 2021-04-15 ENCOUNTER — Inpatient Hospital Stay: Payer: BC Managed Care – PPO | Admitting: Dietician

## 2021-04-15 VITALS — BP 155/77 | HR 83 | Temp 98.5°F | Resp 16 | Ht 62.0 in | Wt 96.0 lb

## 2021-04-15 DIAGNOSIS — C714 Malignant neoplasm of occipital lobe: Secondary | ICD-10-CM | POA: Diagnosis not present

## 2021-04-15 DIAGNOSIS — C719 Malignant neoplasm of brain, unspecified: Secondary | ICD-10-CM | POA: Diagnosis not present

## 2021-04-15 DIAGNOSIS — Z9221 Personal history of antineoplastic chemotherapy: Secondary | ICD-10-CM | POA: Diagnosis not present

## 2021-04-15 DIAGNOSIS — D696 Thrombocytopenia, unspecified: Secondary | ICD-10-CM | POA: Diagnosis not present

## 2021-04-15 DIAGNOSIS — R809 Proteinuria, unspecified: Secondary | ICD-10-CM | POA: Diagnosis not present

## 2021-04-15 DIAGNOSIS — Z5112 Encounter for antineoplastic immunotherapy: Secondary | ICD-10-CM | POA: Diagnosis not present

## 2021-04-15 DIAGNOSIS — Z79899 Other long term (current) drug therapy: Secondary | ICD-10-CM | POA: Diagnosis not present

## 2021-04-15 DIAGNOSIS — D649 Anemia, unspecified: Secondary | ICD-10-CM | POA: Diagnosis not present

## 2021-04-15 DIAGNOSIS — Z923 Personal history of irradiation: Secondary | ICD-10-CM | POA: Diagnosis not present

## 2021-04-15 DIAGNOSIS — Z5111 Encounter for antineoplastic chemotherapy: Secondary | ICD-10-CM | POA: Diagnosis not present

## 2021-04-15 LAB — CMP (CANCER CENTER ONLY)
ALT: 17 U/L (ref 0–44)
AST: 22 U/L (ref 15–41)
Albumin: 3.7 g/dL (ref 3.5–5.0)
Alkaline Phosphatase: 127 U/L — ABNORMAL HIGH (ref 38–126)
Anion gap: 8 (ref 5–15)
BUN: 36 mg/dL — ABNORMAL HIGH (ref 8–23)
CO2: 24 mmol/L (ref 22–32)
Calcium: 9.4 mg/dL (ref 8.9–10.3)
Chloride: 106 mmol/L (ref 98–111)
Creatinine: 1.7 mg/dL — ABNORMAL HIGH (ref 0.44–1.00)
GFR, Estimated: 33 mL/min — ABNORMAL LOW (ref >60)
Glucose, Bld: 87 mg/dL (ref 70–99)
Potassium: 4.7 mmol/L (ref 3.5–5.1)
Sodium: 138 mmol/L (ref 135–145)
Total Bilirubin: 0.6 mg/dL (ref 0.3–1.2)
Total Protein: 6.8 g/dL (ref 6.5–8.1)

## 2021-04-15 LAB — CBC WITH DIFFERENTIAL (CANCER CENTER ONLY)
Abs Immature Granulocytes: 0.05 10*3/uL (ref 0.00–0.07)
Basophils Absolute: 0 10*3/uL (ref 0.0–0.1)
Basophils Relative: 1 %
Eosinophils Absolute: 0.5 10*3/uL (ref 0.0–0.5)
Eosinophils Relative: 11 %
HCT: 28.3 % — ABNORMAL LOW (ref 36.0–46.0)
Hemoglobin: 9.4 g/dL — ABNORMAL LOW (ref 12.0–15.0)
Immature Granulocytes: 1 %
Lymphocytes Relative: 14 %
Lymphs Abs: 0.7 10*3/uL (ref 0.7–4.0)
MCH: 31.9 pg (ref 26.0–34.0)
MCHC: 33.2 g/dL (ref 30.0–36.0)
MCV: 95.9 fL (ref 80.0–100.0)
Monocytes Absolute: 0.6 10*3/uL (ref 0.1–1.0)
Monocytes Relative: 12 %
Neutro Abs: 3 10*3/uL (ref 1.7–7.7)
Neutrophils Relative %: 61 %
Platelet Count: UNDETERMINED 10*3/uL (ref 150–400)
RBC: 2.95 MIL/uL — ABNORMAL LOW (ref 3.87–5.11)
RDW: 12.2 % (ref 11.5–15.5)
WBC Count: 4.8 10*3/uL (ref 4.0–10.5)
nRBC: 0 % (ref 0.0–0.2)

## 2021-04-15 LAB — TOTAL PROTEIN, URINE DIPSTICK: Protein, ur: >2000 mg/dL — AB

## 2021-04-15 NOTE — Progress Notes (Signed)
Nutrition Assessment ? ? ?Reason for Assessment: MST (+wt loss, poor appetite) ? ? ?ASSESSMENT: 66 year old female with left occipital glioblastoma. S/p left craniotomy (2020). She is currently receiving Irinotecan + Bevacizumab q14d. Patient is under the care of Dr. Mickeal Skinner.  ? ?Past medical history includes hypothyroidism, HLD, thrombocytopenia ? ?Patient did not receive treatment today. Met with patient and her sister in office. Patient reports diarrhea after first chemotherapy. This has resolved. Patient reports she is rarely ever hungry, but tries to be intentional with eating. Per sister, patient aims to meet 1000 calories/day. She typically eats small snacks throughout the day (banana, oatmeal, yogurt, cottage cheese with fruit, cheese sticks, crustables). Patient eats little meat, she does not like it. She is drinking one Ensure Plus. Patient does not care for water, she mostly drinks gatorade. Patient denies nausea, vomiting, constipation, diarrhea.  ? ?Nutrition Focused Physical Exam:  ? ?Orbital Region: severe ?Buccal Region: severe ?Temple Region: moderate ?Clavicle Bone Region: severe ?Shoulder and Acromion Bone Region: severe ?Scapular Bone Region: moderate ?Dorsal Hand: severe ?Hair: reviewed ?Eyes: reviewed ?Skin: reviewed  ?Nails: reviewed  ? ? ?Medications: biotin, synthroid, prilosec, crestor  ? ? ?Labs: BUN 36, Cr 1.70 ? ? ?Anthropometrics: Weights have decreased 20% from usual weight in 16 months; insignificant for time frame however concerning  ? ?Height: 5'2" ?Weight: 96 lb  ?UBW: 120 lb (a few years ago per pt, noted 120 lb 1.6 oz on 10/31/19) ?BMI: 17.56 ? ?Estimated Needs: Based on 30-35 kcal/kg/IBW (50kg) to promote weight gain ?Calories: 1500 - 1750 ?Protein: 75-85 ?Fluid: 1.5 L ? ?MALNUTRITION DIAGNOSIS: Patient meets criteria for severe malnutrition in the context of chronic illness related to glioblastoma as evidenced by moderate/severe fat and muscle depletion, intake ~1000 kcal/day  per recall meeting </= 75% of needs for >/= 1 month.  ? ? ?INTERVENTION:  ?Encouraged high calorie high protein foods - handout with shake recipes provided ?Continue eating small frequent meals and snacks, encouraged bites q2h - handout provided ?Continue drinking Ensure Plus/equivalent, recommend 2 daily ?One complimentary case of Ensure Plus High Protein provided  ?Contact information given  ? ? ?MONITORING, EVALUATION, GOAL: Patient will tolerate increased calories and protein to promote weight gain  ? ? ?Next Visit: To be scheduled with treatment  ? ? ? ? ? ? ?

## 2021-04-15 NOTE — Progress Notes (Signed)
? ?[facility] at [address]  ?[place], [place] [address] ?[phone] ? ? ?Interval Evaluation ? ?Date of Service: 04/15/21 ?Patient Name: Jillian Gomez ?Patient MRN: [id number] ?Patient DOB: 12-31-1955 ?Provider: Ventura Sellers, MD ? ?Identifying Statement:  ?Jillian Gomez is a 66 y.o. female with left occipital glioblastoma  ? ?Oncologic History: ?Oncology History  ?Glioblastoma with isocitrate dehydrogenase gene wildtype ([facility])  ?12/23/2018 Surgery  ? Craniotomy, left occipital resection by Dr. Kathyrn Sheriff.  Path is GBM IDH-wt ?  ?01/23/2019 - 03/03/2019 Radiation Therapy  ? IMRT with concurrent Temozolomide 39m/m2 ?  ?03/28/2019 - 09/04/2019 Chemotherapy  ? 5 cycles of adjuvant 5-day Temozolomide 2029mm2 ? ?  ?09/01/2019 Progression  ? Progression of disease #1 ?  ?09/19/2019 -  Chemotherapy  ? Begins second line therapy with oral CCNU 9036m2 q6 weeks, concurrent Avastin 43m71m IV q2 weeks ?  ?04/30/2020 -  Chemotherapy  ? Completes 4th cycle of CCNU/Avastin, transitions to Avastin monotherapy due to cytopenias ?  ?08/27/2020 -  Chemotherapy  ? Resumes CCNU+Avastin after periventricular DWI signal change noted on MRI ? ?  ?12/14/2020 Progression  ? Progression of disease #2 ?  ?12/19/2020 - 04/01/2021 Chemotherapy  ? Initiates daily metronomic Temodar 50mg47mwith Avastin 43mg/60m2 weeks ?  ?03/30/2021 Progression  ? Progression of disease #3 ?  ?04/08/2021 -  Chemotherapy  ? Patient is on Treatment Plan : BRAIN GBM [facility] Irinotecan + Bevacizumab D1,15 q 28d  ?   ? ? ?Biomarkers: ? ?MGMT Unknown.  ?IDH 1/2 Wild type.  ?EGFR Unknown  ?TERT Unknown  ? ?Interval History: ? Jillian RENETTA SUMANnts today for planned avastin infusion.  She did experience some diarrhea with the irinotecan, now resolved.  Denies new or progressive changes.  No right sided weakness. No seizures or headaches. ? ?H+P (01/09/19) Patient presented to medical attention in early December with ~2 months history of  right sided visual impairment.  She describes fuzzy or blurry vision on that side which was progressive over time, leading to at least one fall.  MRI brain demonstrated enhancing left occipital mass; this was subsequently resected by Dr. NundkuKathyrn Sheriff/18/20.  She had no issues with surgery and has completed her steroid taper.  Continues on keppra daily but no history of any seizure. She has returned to work with only modest difficutly.  No issues walking or performing ADLs; lives alone but her sister and other family members are nearby. ? ?Medications: ?Current Outpatient Medications on File Prior to Visit  ?Medication Sig Dispense Refill  ? amLODipine (NORVASC) 2.5 MG tablet Take 1 tablet (2.5 mg total) by mouth daily. 90 tablet 1  ? Biotin 1 MG CAPS Take 1 mg by mouth daily.    ? CALCIUM PO Take 1 tablet by mouth daily.    ? doxycycline (VIBRA-TABS) 100 MG tablet Take 1 tablet by mouth daily. (Patient not taking: Reported on 11/05/2020)    ? levothyroxine (SYNTHROID) 75 MCG tablet Take 75 mcg by mouth daily.    ? naproxen sodium (ALEVE) 220 MG tablet Take 1 tablet (220 mg total) by mouth daily as needed (pain). (Patient not taking: Reported on 03/18/2021)    ? omeprazole (PRILOSEC) 20 MG capsule Take 1 capsule (20 mg total) by mouth daily. 30 capsule 3  ? rosuvastatin (CRESTOR) 10 MG tablet Take 5 mg by mouth at bedtime.    ? ?No current facility-administered medications on file prior to visit.  ? ? ?Allergies:  ?  Allergies  ?Allergen Reactions  ? Etodolac Other (See Comments)  ?  GI upset  ? Macrobid [Nitrofurantoin] Rash  ? ?Past Medical History:  ?Past Medical History:  ?Diagnosis Date  ? High cholesterol   ? Hypothyroidism   ? Joint pain   ? Thrombocytopenia ([place]) 02/26/2020  ? ?Past Surgical History:  ?Past Surgical History:  ?Procedure Laterality Date  ? APPLICATION OF CRANIAL NAVIGATION Left 12/23/2018  ? Procedure: APPLICATION OF CRANIAL NAVIGATION;  Surgeon: Consuella Lose, MD;  Location: [facility];   Service: Neurosurgery;  Laterality: Left;  APPLICATION OF CRANIAL NAVIGATION  ? BREAST BIOPSY Left   ? COLONOSCOPY    ? CRANIOTOMY Left 12/23/2018  ? Procedure: STEREOTACTIC LEFT OCCIPITAL CRANIOTOMY FOR RESECTION OF TUMOR;  Surgeon: Consuella Lose, MD;  Location: [facility];  Service: Neurosurgery;  Laterality: Left;  STEREOTACTIC LEFT OCCIPITAL CRANIOTOMY FOR RESECTION OF TUMOR  ? LASIK    ? ?Social History:  ?Social History  ? ?Socioeconomic History  ? Marital status: Single  ?  Spouse name: Not on file  ? Number of children: Not on file  ? Years of education: Not on file  ? Highest education level: Not on file  ?Occupational History  ? Not on file  ?Tobacco Use  ? Smoking status: Never  ? Smokeless tobacco: Never  ?Vaping Use  ? Vaping Use: Never used  ?Substance and Sexual Activity  ? Alcohol use: Not Currently  ? Drug use: Never  ? Sexual activity: Not on file  ?Other Topics Concern  ? Not on file  ?Social History Narrative  ? Not on file  ? ?Social Determinants of Health  ? ?Financial Resource Strain: Not on file  ?Food Insecurity: Not on file  ?Transportation Needs: Not on file  ?Physical Activity: Not on file  ?Stress: Not on file  ?Social Connections: Not on file  ?Intimate Partner Violence: Not on file  ? ?Family History: No family history on file. ? ?Review of Systems: ?Constitutional: Denies fevers, chills or abnormal weight loss ?Eyes: Denies blurriness of vision ?Gastrointestinal:  Denies nausea, constipation, diarrhea ?GU: Denies dysuria or incontinence ?Skin: Denies abnormal skin rashes ?Musculoskeletal: Denies joint pain, back or neck discomfort.  ?Behavioral/Psych: Denies anxiety, mood instability ? ?Physical Exam: ?Vitals:  ? 04/15/21 1054  ?BP: (!) 155/77  ?Pulse: 83  ?Resp: 16  ?Temp: 98.5 ?F (36.9 ?C)  ?SpO2: 100%  ? ?KPS: 80. ?General: Alert, cooperative, pleasant, in no acute distress ?Head: Craniotomy scar noted, dry and intact. ?EENT: No conjunctival injection or scleral icterus. Oral  mucosa moist ?Lungs: Resp effort normal ?Cardiac: Regular rate and rhythm ?Abdomen: Soft, non-distended abdomen ?Skin: No rashes cyanosis or petechiae. ?Extremities: Left shin skin level cut, bleeding ? ?Neurologic Exam: ?Mental Status: Awake, alert, attentive to examiner. Oriented to self and environment. Language is notable for mild transcortical expressive dyshpasia. Cranial Nerves: Visual acuity is grossly normal. Right homonymous hemianopia. Extra-ocular movements intact. No ptosis. Face is symmetric, tongue midline. ?Motor: Tone and bulk are normal. Power is full in both arms and legs. Reflexes are symmetric, no pathologic reflexes present. Intact finger to nose bilaterally ?Sensory: Intact to light touch and temperature ?Gait: Normal and tandem gait is deferred. ? ? ?Labs: ?I have reviewed the data as listed ?   ?Component Value Date/Time  ? NA 138 04/08/2021 1040  ? K 4.2 04/08/2021 1040  ? CL 107 04/08/2021 1040  ? CO2 24 04/08/2021 1040  ? GLUCOSE 77 04/08/2021 1040  ? BUN 44 (H) 04/08/2021 1040  ?  CREATININE 1.50 (H) 04/08/2021 1040  ? CALCIUM 9.0 04/08/2021 1040  ? PROT 6.5 04/08/2021 1040  ? ALBUMIN 3.6 04/08/2021 1040  ? AST 26 04/08/2021 1040  ? ALT 20 04/08/2021 1040  ? ALKPHOS 147 (H) 04/08/2021 1040  ? BILITOT 0.5 04/08/2021 1040  ? GFRNONAA 38 (L) 04/08/2021 1040  ? GFRAA >60 10/03/2019 1133  ? ?Lab Results  ?Component Value Date  ? WBC 4.8 04/15/2021  ? NEUTROABS 3.0 04/15/2021  ? HGB 9.4 (L) 04/15/2021  ? HCT 28.3 (L) 04/15/2021  ? MCV 95.9 04/15/2021  ? PLT PLATELET CLUMPS NOTED ON SMEAR, UNABLE TO ESTIMATE 04/15/2021  ? ?Assessment/Plan ?Glioblastoma, IDH-1 WT ? ?ZAVANNAH DEBLOIS is clinically stable today.  Labs are notable again for proteinuria. ? ?Avastin will be held again today because of ongoing proteinuria, renal impairment.   ? ?Chemotherapy should be held for the following: ?? ANC less than 1,000 ?? Platelets less than 100,000 ?? LFT or creatinine greater than 2x ULN ?? If clinical  concerns/contraindications develop ? ?Avastin should be held for the following: ? ?? ANC less than 500 ?? Platelets less than 50,000 ?? LFT or creatinine greater than 2x ULN ?? If clinical concerns/contraindications de

## 2021-04-21 DIAGNOSIS — Z1151 Encounter for screening for human papillomavirus (HPV): Secondary | ICD-10-CM | POA: Diagnosis not present

## 2021-04-21 DIAGNOSIS — Z01419 Encounter for gynecological examination (general) (routine) without abnormal findings: Secondary | ICD-10-CM | POA: Diagnosis not present

## 2021-04-21 DIAGNOSIS — Z124 Encounter for screening for malignant neoplasm of cervix: Secondary | ICD-10-CM | POA: Diagnosis not present

## 2021-04-22 ENCOUNTER — Ambulatory Visit: Payer: BC Managed Care – PPO

## 2021-04-22 ENCOUNTER — Other Ambulatory Visit: Payer: BC Managed Care – PPO

## 2021-04-22 ENCOUNTER — Ambulatory Visit: Payer: BC Managed Care – PPO | Admitting: Internal Medicine

## 2021-04-29 ENCOUNTER — Inpatient Hospital Stay: Payer: BC Managed Care – PPO

## 2021-04-29 ENCOUNTER — Other Ambulatory Visit: Payer: Self-pay

## 2021-04-29 ENCOUNTER — Inpatient Hospital Stay (HOSPITAL_BASED_OUTPATIENT_CLINIC_OR_DEPARTMENT_OTHER): Payer: BC Managed Care – PPO | Admitting: Internal Medicine

## 2021-04-29 ENCOUNTER — Other Ambulatory Visit: Payer: Self-pay | Admitting: *Deleted

## 2021-04-29 VITALS — BP 145/80 | HR 97 | Temp 97.5°F | Resp 17 | Wt 97.4 lb

## 2021-04-29 DIAGNOSIS — D649 Anemia, unspecified: Secondary | ICD-10-CM

## 2021-04-29 DIAGNOSIS — C719 Malignant neoplasm of brain, unspecified: Secondary | ICD-10-CM

## 2021-04-29 DIAGNOSIS — Z5111 Encounter for antineoplastic chemotherapy: Secondary | ICD-10-CM | POA: Diagnosis not present

## 2021-04-29 DIAGNOSIS — D696 Thrombocytopenia, unspecified: Secondary | ICD-10-CM | POA: Diagnosis not present

## 2021-04-29 DIAGNOSIS — Z5112 Encounter for antineoplastic immunotherapy: Secondary | ICD-10-CM | POA: Diagnosis not present

## 2021-04-29 DIAGNOSIS — R809 Proteinuria, unspecified: Secondary | ICD-10-CM | POA: Diagnosis not present

## 2021-04-29 DIAGNOSIS — Z9221 Personal history of antineoplastic chemotherapy: Secondary | ICD-10-CM | POA: Diagnosis not present

## 2021-04-29 DIAGNOSIS — Z79899 Other long term (current) drug therapy: Secondary | ICD-10-CM | POA: Diagnosis not present

## 2021-04-29 DIAGNOSIS — C714 Malignant neoplasm of occipital lobe: Secondary | ICD-10-CM | POA: Diagnosis not present

## 2021-04-29 DIAGNOSIS — Z923 Personal history of irradiation: Secondary | ICD-10-CM | POA: Diagnosis not present

## 2021-04-29 LAB — CBC WITH DIFFERENTIAL (CANCER CENTER ONLY)
Abs Immature Granulocytes: 0.01 10*3/uL (ref 0.00–0.07)
Basophils Absolute: 0 10*3/uL (ref 0.0–0.1)
Basophils Relative: 1 %
Eosinophils Absolute: 0.6 10*3/uL — ABNORMAL HIGH (ref 0.0–0.5)
Eosinophils Relative: 9 %
HCT: 23.6 % — ABNORMAL LOW (ref 36.0–46.0)
Hemoglobin: 7.9 g/dL — ABNORMAL LOW (ref 12.0–15.0)
Immature Granulocytes: 0 %
Lymphocytes Relative: 11 %
Lymphs Abs: 0.8 10*3/uL (ref 0.7–4.0)
MCH: 32.5 pg (ref 26.0–34.0)
MCHC: 33.5 g/dL (ref 30.0–36.0)
MCV: 97.1 fL (ref 80.0–100.0)
Monocytes Absolute: 1 10*3/uL (ref 0.1–1.0)
Monocytes Relative: 14 %
Neutro Abs: 4.6 10*3/uL (ref 1.7–7.7)
Neutrophils Relative %: 65 %
Platelet Count: 143 10*3/uL — ABNORMAL LOW (ref 150–400)
RBC: 2.43 MIL/uL — ABNORMAL LOW (ref 3.87–5.11)
RDW: 13.1 % (ref 11.5–15.5)
WBC Count: 7.1 10*3/uL (ref 4.0–10.5)
nRBC: 0 % (ref 0.0–0.2)

## 2021-04-29 LAB — TOTAL PROTEIN, URINE DIPSTICK: Protein, ur: 300 mg/dL — AB

## 2021-04-29 LAB — CMP (CANCER CENTER ONLY)
ALT: 20 U/L (ref 0–44)
AST: 27 U/L (ref 15–41)
Albumin: 3.4 g/dL — ABNORMAL LOW (ref 3.5–5.0)
Alkaline Phosphatase: 151 U/L — ABNORMAL HIGH (ref 38–126)
Anion gap: 9 (ref 5–15)
BUN: 47 mg/dL — ABNORMAL HIGH (ref 8–23)
CO2: 26 mmol/L (ref 22–32)
Calcium: 8.9 mg/dL (ref 8.9–10.3)
Chloride: 104 mmol/L (ref 98–111)
Creatinine: 1.32 mg/dL — ABNORMAL HIGH (ref 0.44–1.00)
GFR, Estimated: 45 mL/min — ABNORMAL LOW (ref >60)
Glucose, Bld: 83 mg/dL (ref 70–99)
Potassium: 4.8 mmol/L (ref 3.5–5.1)
Sodium: 139 mmol/L (ref 135–145)
Total Bilirubin: 0.4 mg/dL (ref 0.3–1.2)
Total Protein: 6.2 g/dL — ABNORMAL LOW (ref 6.5–8.1)

## 2021-04-29 LAB — PREPARE RBC (CROSSMATCH)

## 2021-04-29 MED ORDER — IRINOTECAN HCL CHEMO INJECTION 100 MG/5ML
125.0000 mg/m2 | Freq: Once | INTRAVENOUS | Status: AC
  Administered 2021-04-29: 180 mg via INTRAVENOUS
  Filled 2021-04-29: qty 9

## 2021-04-29 MED ORDER — SODIUM CHLORIDE 0.9 % IV SOLN
10.0000 mg | Freq: Once | INTRAVENOUS | Status: AC
  Administered 2021-04-29: 10 mg via INTRAVENOUS
  Filled 2021-04-29: qty 10

## 2021-04-29 MED ORDER — PALONOSETRON HCL INJECTION 0.25 MG/5ML
0.2500 mg | Freq: Once | INTRAVENOUS | Status: AC
  Administered 2021-04-29: 0.25 mg via INTRAVENOUS
  Filled 2021-04-29: qty 5

## 2021-04-29 MED ORDER — SODIUM CHLORIDE 0.9 % IV SOLN: Freq: Once | INTRAVENOUS | Status: AC

## 2021-04-29 MED ORDER — ATROPINE SULFATE 1 MG/ML IV SOLN
1.0000 mg | Freq: Once | INTRAVENOUS | Status: AC | PRN
  Administered 2021-04-29: 1 mg via INTRAVENOUS
  Filled 2021-04-29: qty 1

## 2021-04-29 MED ORDER — SODIUM CHLORIDE 0.9 % IV SOLN
10.0000 mg/kg | Freq: Once | INTRAVENOUS | Status: AC
  Administered 2021-04-29: 450 mg via INTRAVENOUS
  Filled 2021-04-29: qty 16

## 2021-04-29 NOTE — Progress Notes (Signed)
? ?[facility] [address]  ?[place], [place] [address] ?[phone]) [phone] ? ? ?Interval Evaluation ? ?Date of Service: 04/29/21 ?Patient Name: Jillian Gomez ?Patient MRN: [id number] ?Patient DOB: May 12, 1955 ?Provider: Ventura Sellers, MD ? ?Identifying Statement:  ?Jillian Gomez is a 66 y.o. female with left occipital glioblastoma  ? ?Oncologic History: ?Oncology History  ?Glioblastoma with isocitrate dehydrogenase gene wildtype ([place])  ?12/23/2018 Surgery  ? Craniotomy, left occipital resection by Dr. Kathyrn Sheriff.  Path is GBM IDH-wt ?  ?01/23/2019 - 03/03/2019 Radiation Therapy  ? IMRT with concurrent Temozolomide 66m/m2 ?  ?03/28/2019 - 09/04/2019 Chemotherapy  ? 5 cycles of adjuvant 5-day Temozolomide 2038mm2 ? ?  ?09/01/2019 Progression  ? Progression of disease #1 ?  ?09/19/2019 -  Chemotherapy  ? Begins second line therapy with oral CCNU 9062m2 q6 weeks, concurrent Avastin 43m5m IV q2 weeks ?  ?04/30/2020 -  Chemotherapy  ? Completes 4th cycle of CCNU/Avastin, transitions to Avastin monotherapy due to cytopenias ?  ?08/27/2020 -  Chemotherapy  ? Resumes CCNU+Avastin after periventricular DWI signal change noted on MRI ? ?  ?12/14/2020 Progression  ? Progression of disease #2 ?  ?12/19/2020 - 04/01/2021 Chemotherapy  ? Initiates daily metronomic Temodar 50mg71mwith Avastin 43mg/45m2 weeks ?  ?03/30/2021 Progression  ? Progression of disease #3 ?  ?04/08/2021 -  Chemotherapy  ? Patient is on Treatment Plan : BRAIN GBM [facility] Irinotecan + Bevacizumab D1,15 q 28d  ? ?  ?  ? ? ?Biomarkers: ? ?MGMT Unknown.  ?IDH 1/2 Wild type.  ?EGFR Unknown  ?TERT Unknown  ? ?Interval History: ? Jillian ELVETA RAPEnts today for planned avastin and irinotecan infusion.  Only very mild diarrhea this past cycle, though she does describe some hair loss.  Denies new or progressive changes otherwise.  No right sided weakness. No seizures or headaches. ? ?H+P (01/09/19) Patient presented to medical attention in  early December with ~2 months history of right sided visual impairment.  She describes fuzzy or blurry vision on that side which was progressive over time, leading to at least one fall.  MRI brain demonstrated enhancing left occipital mass; this was subsequently resected by Dr. NundkuKathyrn Sheriff/18/20.  She had no issues with surgery and has completed her steroid taper.  Continues on keppra daily but no history of any seizure. She has returned to work with only modest difficutly.  No issues walking or performing ADLs; lives alone but her sister and other family members are nearby. ? ?Medications: ?Current Outpatient Medications on File Prior to Visit  ?Medication Sig Dispense Refill  ? amLODipine (NORVASC) 2.5 MG tablet Take 1 tablet (2.5 mg total) by mouth daily. 90 tablet 1  ? Biotin 1 MG CAPS Take 1 mg by mouth daily.    ? CALCIUM PO Take 1 tablet by mouth daily.    ? doxycycline (VIBRA-TABS) 100 MG tablet Take 1 tablet by mouth daily. (Patient not taking: Reported on 11/05/2020)    ? levothyroxine (SYNTHROID) 75 MCG tablet Take 75 mcg by mouth daily.    ? naproxen sodium (ALEVE) 220 MG tablet Take 1 tablet (220 mg total) by mouth daily as needed (pain). (Patient not taking: Reported on 03/18/2021)    ? omeprazole (PRILOSEC) 20 MG capsule Take 1 capsule (20 mg total) by mouth daily. 30 capsule 3  ? rosuvastatin (CRESTOR) 10 MG tablet Take 5 mg by mouth at bedtime.    ? ?No current facility-administered medications on  file prior to visit.  ? ? ?Allergies:  ?Allergies  ?Allergen Reactions  ? Etodolac Other (See Comments)  ?  GI upset  ? Macrobid [Nitrofurantoin] Rash  ? ?Past Medical History:  ?Past Medical History:  ?Diagnosis Date  ? High cholesterol   ? Hypothyroidism   ? Joint pain   ? Thrombocytopenia ([place]) 02/26/2020  ? ?Past Surgical History:  ?Past Surgical History:  ?Procedure Laterality Date  ? APPLICATION OF CRANIAL NAVIGATION Left 12/23/2018  ? Procedure: APPLICATION OF CRANIAL NAVIGATION;  Surgeon:  Consuella Lose, MD;  Location: [facility];  Service: Neurosurgery;  Laterality: Left;  APPLICATION OF CRANIAL NAVIGATION  ? BREAST BIOPSY Left   ? COLONOSCOPY    ? CRANIOTOMY Left 12/23/2018  ? Procedure: STEREOTACTIC LEFT OCCIPITAL CRANIOTOMY FOR RESECTION OF TUMOR;  Surgeon: Consuella Lose, MD;  Location: [facility];  Service: Neurosurgery;  Laterality: Left;  STEREOTACTIC LEFT OCCIPITAL CRANIOTOMY FOR RESECTION OF TUMOR  ? LASIK    ? ?Social History:  ?Social History  ? ?Socioeconomic History  ? Marital status: Single  ?  Spouse name: Not on file  ? Number of children: Not on file  ? Years of education: Not on file  ? Highest education level: Not on file  ?Occupational History  ? Not on file  ?Tobacco Use  ? Smoking status: Never  ? Smokeless tobacco: Never  ?Vaping Use  ? Vaping Use: Never used  ?Substance and Sexual Activity  ? Alcohol use: Not Currently  ? Drug use: Never  ? Sexual activity: Not on file  ?Other Topics Concern  ? Not on file  ?Social History Narrative  ? Not on file  ? ?Social Determinants of Health  ? ?Financial Resource Strain: Not on file  ?Food Insecurity: Not on file  ?Transportation Needs: Not on file  ?Physical Activity: Not on file  ?Stress: Not on file  ?Social Connections: Not on file  ?Intimate Partner Violence: Not on file  ? ?Family History: No family history on file. ? ?Review of Systems: ?Constitutional: Denies fevers, chills or abnormal weight loss ?Eyes: Denies blurriness of vision ?Gastrointestinal:  Denies nausea, constipation, diarrhea ?GU: Denies dysuria or incontinence ?Skin: Denies abnormal skin rashes ?Musculoskeletal: Denies joint pain, back or neck discomfort.  ?Behavioral/Psych: Denies anxiety, mood instability ? ?Physical Exam: ?Vitals:  ? 04/29/21 1401  ?BP: (!) 145/80  ?Pulse: 97  ?Resp: 17  ?Temp: (!) 97.5 ?F (36.4 ?C)  ?SpO2: 100%  ? ?KPS: 80. ?General: Alert, cooperative, pleasant, in no acute distress ?Head: Craniotomy scar noted, dry and intact. ?EENT: No  conjunctival injection or scleral icterus. Oral mucosa moist ?Lungs: Resp effort normal ?Cardiac: Regular rate and rhythm ?Abdomen: Soft, non-distended abdomen ?Skin: No rashes cyanosis or petechiae. ?Extremities: Left shin skin level cut, bleeding ? ?Neurologic Exam: ?Mental Status: Awake, alert, attentive to examiner. Oriented to self and environment. Language is notable for mild transcortical expressive dyshpasia. Cranial Nerves: Visual acuity is grossly normal. Right homonymous hemianopia. Extra-ocular movements intact. No ptosis. Face is symmetric, tongue midline. ?Motor: Tone and bulk are normal. Power is full in both arms and legs. Reflexes are symmetric, no pathologic reflexes present. Intact finger to nose bilaterally ?Sensory: Intact to light touch and temperature ?Gait: Normal and tandem gait is deferred. ? ? ?Labs: ?I have reviewed the data as listed ?   ?Component Value Date/Time  ? NA 138 04/15/2021 1035  ? K 4.7 04/15/2021 1035  ? CL 106 04/15/2021 1035  ? CO2 24 04/15/2021 1035  ? GLUCOSE  87 04/15/2021 1035  ? BUN 36 (H) 04/15/2021 1035  ? CREATININE 1.70 (H) 04/15/2021 1035  ? CALCIUM 9.4 04/15/2021 1035  ? PROT 6.8 04/15/2021 1035  ? ALBUMIN 3.7 04/15/2021 1035  ? AST 22 04/15/2021 1035  ? ALT 17 04/15/2021 1035  ? ALKPHOS 127 (H) 04/15/2021 1035  ? BILITOT 0.6 04/15/2021 1035  ? GFRNONAA 33 (L) 04/15/2021 1035  ? GFRAA >60 10/03/2019 1133  ? ?Lab Results  ?Component Value Date  ? WBC 7.1 04/29/2021  ? NEUTROABS 4.6 04/29/2021  ? HGB 7.9 (L) 04/29/2021  ? HCT 23.6 (L) 04/29/2021  ? MCV 97.1 04/29/2021  ? PLT 143 (L) 04/29/2021  ? ?Assessment/Plan ?Glioblastoma, IDH-1 WT ? ?BRYNDA HEICK is clinically stable today.  Labs are notable for improved proteinuria, thrombocytopenia.  Her hemoglobin continues to decline, 7.9 today. ? ?She is clear to proceed with irinotecan and avastin today.  We will draw type and screen and request 1 unit of PRBC for transfusion as well.  This may have to be administered  later in the week due to time constraints in infusion.  Patient consented for administration of blood products. ? ?Chemotherapy should be held for the following: ?? ANC less than 1,000 ?? Platelets less than 100,00

## 2021-04-29 NOTE — Patient Instructions (Signed)
[facility]  Discharge Instructions: ?Thank you for choosing [facility] to provide your oncology and hematology care.  ? ?If you have a lab appointment with the [facility], please go directly to the [facility] and check in at the registration area. ?  ?Wear comfortable clothing and clothing appropriate for easy access to any Portacath or PICC line.  ? ?We strive to give you quality time with your provider. You may need to reschedule your appointment if you arrive late (15 or more minutes).  Arriving late affects you and other patients whose appointments are after yours.  Also, if you miss three or more appointments without notifying the office, you may be dismissed from the clinic at the provider?s discretion.    ?  ?For prescription refill requests, have your pharmacy contact our office and allow 72 hours for refills to be completed.   ? ?Today you received the following chemotherapy and/or immunotherapy agents: Avastin/Irinotecan.    ?  ?To help prevent nausea and vomiting after your treatment, we encourage you to take your nausea medication as directed. ? ?BELOW ARE SYMPTOMS THAT SHOULD BE REPORTED IMMEDIATELY: ?*FEVER GREATER THAN 100.4 F (38 ?C) OR HIGHER ?*CHILLS OR SWEATING ?*NAUSEA AND VOMITING THAT IS NOT CONTROLLED WITH YOUR NAUSEA MEDICATION ?*UNUSUAL SHORTNESS OF BREATH ?*UNUSUAL BRUISING OR BLEEDING ?*URINARY PROBLEMS (pain or burning when urinating, or frequent urination) ?*BOWEL PROBLEMS (unusual diarrhea, constipation, pain near the anus) ?TENDERNESS IN MOUTH AND THROAT WITH OR WITHOUT PRESENCE OF ULCERS (sore throat, sores in mouth, or a toothache) ?UNUSUAL RASH, SWELLING OR PAIN  ?UNUSUAL VAGINAL DISCHARGE OR ITCHING  ? ?Items with * indicate a potential emergency and should be followed up as soon as possible or go to the Emergency Department if any problems should occur. ? ?Please show the CHEMOTHERAPY ALERT CARD or IMMUNOTHERAPY ALERT CARD at  check-in to the Emergency Department and triage nurse. ? ?Should you have questions after your visit or need to cancel or reschedule your appointment, please contact [facility]  Dept: [phone]  and follow the prompts.  Office hours are 8:00 a.m. to 4:30 p.m. Monday - Friday. Please note that voicemails left after 4:00 p.m. may not be returned until the following business day.  We are closed weekends and major holidays. You have access to a nurse at all times for urgent questions. Please call the main number to the clinic Dept: [phone] and follow the prompts. ? ? ?For any non-urgent questions, you may also contact your provider using MyChart. We now offer e-Visits for anyone 21 and older to request care online for non-urgent symptoms. For details visit [email or web address]. ?  ?Also download the MyChart app! Go to the app store, search "MyChart", open the app, select [facility], and log in with your MyChart username and password. ? ?Due to Covid, a mask is required upon entering the hospital/clinic. If you do not have a mask, one will be given to you upon arrival. For doctor visits, patients may have 1 support person aged 108 or older with them. For treatment visits, patients cannot have anyone with them due to current Covid guidelines and our immunocompromised population.  ? ?

## 2021-05-02 ENCOUNTER — Other Ambulatory Visit: Payer: Self-pay | Admitting: Internal Medicine

## 2021-05-02 ENCOUNTER — Inpatient Hospital Stay: Payer: BC Managed Care – PPO

## 2021-05-02 ENCOUNTER — Encounter: Payer: Self-pay | Admitting: Internal Medicine

## 2021-05-02 ENCOUNTER — Other Ambulatory Visit: Payer: Self-pay

## 2021-05-02 DIAGNOSIS — C714 Malignant neoplasm of occipital lobe: Secondary | ICD-10-CM | POA: Diagnosis not present

## 2021-05-02 DIAGNOSIS — Z5112 Encounter for antineoplastic immunotherapy: Secondary | ICD-10-CM | POA: Diagnosis not present

## 2021-05-02 DIAGNOSIS — R809 Proteinuria, unspecified: Secondary | ICD-10-CM | POA: Diagnosis not present

## 2021-05-02 DIAGNOSIS — D649 Anemia, unspecified: Secondary | ICD-10-CM | POA: Diagnosis not present

## 2021-05-02 DIAGNOSIS — D696 Thrombocytopenia, unspecified: Secondary | ICD-10-CM | POA: Diagnosis not present

## 2021-05-02 DIAGNOSIS — Z79899 Other long term (current) drug therapy: Secondary | ICD-10-CM | POA: Diagnosis not present

## 2021-05-02 DIAGNOSIS — Z5111 Encounter for antineoplastic chemotherapy: Secondary | ICD-10-CM | POA: Diagnosis not present

## 2021-05-02 DIAGNOSIS — Z9221 Personal history of antineoplastic chemotherapy: Secondary | ICD-10-CM | POA: Diagnosis not present

## 2021-05-02 DIAGNOSIS — Z923 Personal history of irradiation: Secondary | ICD-10-CM | POA: Diagnosis not present

## 2021-05-02 MED ORDER — SODIUM CHLORIDE 0.9% IV SOLUTION
250.0000 mL | Freq: Once | INTRAVENOUS | Status: AC
  Administered 2021-05-02: 250 mL via INTRAVENOUS

## 2021-05-02 MED ORDER — DIPHENHYDRAMINE HCL 25 MG PO CAPS
25.0000 mg | ORAL_CAPSULE | Freq: Once | ORAL | Status: AC
  Administered 2021-05-02: 25 mg via ORAL
  Filled 2021-05-02: qty 1

## 2021-05-02 MED ORDER — ACETAMINOPHEN 325 MG PO TABS
650.0000 mg | ORAL_TABLET | Freq: Once | ORAL | Status: AC
  Administered 2021-05-02: 650 mg via ORAL
  Filled 2021-05-02: qty 2

## 2021-05-02 MED ORDER — ONDANSETRON HCL 8 MG PO TABS
8.0000 mg | ORAL_TABLET | Freq: Once | ORAL | Status: AC
  Administered 2021-05-02: 8 mg via ORAL
  Filled 2021-05-02: qty 1

## 2021-05-02 NOTE — Progress Notes (Signed)
Pt reports some nausea during blood transfusion. VSS. Dr. Mickeal Skinner notified and zofran to be ordered per Dr. Mickeal Skinner. ?

## 2021-05-03 LAB — TYPE AND SCREEN
ABO/RH(D): O NEG
Antibody Screen: NEGATIVE
Unit division: 0

## 2021-05-03 LAB — BPAM RBC
Blood Product Expiration Date: 202305282359
ISSUE DATE / TIME: 202304281145
Unit Type and Rh: 9500

## 2021-05-05 ENCOUNTER — Encounter: Payer: Self-pay | Admitting: Internal Medicine

## 2021-05-06 ENCOUNTER — Ambulatory Visit: Payer: BC Managed Care – PPO | Admitting: Internal Medicine

## 2021-05-06 ENCOUNTER — Other Ambulatory Visit: Payer: BC Managed Care – PPO

## 2021-05-06 ENCOUNTER — Ambulatory Visit: Payer: BC Managed Care – PPO

## 2021-05-07 ENCOUNTER — Telehealth: Payer: Self-pay | Admitting: Internal Medicine

## 2021-05-07 NOTE — Telephone Encounter (Signed)
Per 5/3 phone lines pt sister called wanted to verify appointments. Pt sister confirmed appointment  ?

## 2021-05-20 ENCOUNTER — Inpatient Hospital Stay: Payer: BC Managed Care – PPO

## 2021-05-20 ENCOUNTER — Other Ambulatory Visit: Payer: Self-pay

## 2021-05-20 ENCOUNTER — Inpatient Hospital Stay: Payer: BC Managed Care – PPO | Admitting: Dietician

## 2021-05-20 ENCOUNTER — Inpatient Hospital Stay (HOSPITAL_BASED_OUTPATIENT_CLINIC_OR_DEPARTMENT_OTHER): Payer: BC Managed Care – PPO | Admitting: Internal Medicine

## 2021-05-20 ENCOUNTER — Inpatient Hospital Stay: Payer: BC Managed Care – PPO | Attending: Internal Medicine

## 2021-05-20 VITALS — BP 145/89 | HR 90 | Temp 97.8°F | Resp 15 | Ht 62.0 in | Wt 98.4 lb

## 2021-05-20 DIAGNOSIS — C719 Malignant neoplasm of brain, unspecified: Secondary | ICD-10-CM | POA: Diagnosis not present

## 2021-05-20 DIAGNOSIS — Z923 Personal history of irradiation: Secondary | ICD-10-CM | POA: Insufficient documentation

## 2021-05-20 DIAGNOSIS — R809 Proteinuria, unspecified: Secondary | ICD-10-CM | POA: Insufficient documentation

## 2021-05-20 DIAGNOSIS — C714 Malignant neoplasm of occipital lobe: Secondary | ICD-10-CM | POA: Diagnosis not present

## 2021-05-20 DIAGNOSIS — Z5111 Encounter for antineoplastic chemotherapy: Secondary | ICD-10-CM | POA: Diagnosis not present

## 2021-05-20 LAB — CMP (CANCER CENTER ONLY)
ALT: 14 U/L (ref 0–44)
AST: 24 U/L (ref 15–41)
Albumin: 3.7 g/dL (ref 3.5–5.0)
Alkaline Phosphatase: 103 U/L (ref 38–126)
Anion gap: 6 (ref 5–15)
BUN: 46 mg/dL — ABNORMAL HIGH (ref 8–23)
CO2: 26 mmol/L (ref 22–32)
Calcium: 9.1 mg/dL (ref 8.9–10.3)
Chloride: 106 mmol/L (ref 98–111)
Creatinine: 1.59 mg/dL — ABNORMAL HIGH (ref 0.44–1.00)
GFR, Estimated: 36 mL/min — ABNORMAL LOW (ref >60)
Glucose, Bld: 87 mg/dL (ref 70–99)
Potassium: 4.4 mmol/L (ref 3.5–5.1)
Sodium: 138 mmol/L (ref 135–145)
Total Bilirubin: 0.5 mg/dL (ref 0.3–1.2)
Total Protein: 6.6 g/dL (ref 6.5–8.1)

## 2021-05-20 LAB — CBC WITH DIFFERENTIAL (CANCER CENTER ONLY)
Abs Immature Granulocytes: 0.01 10*3/uL (ref 0.00–0.07)
Basophils Absolute: 0 10*3/uL (ref 0.0–0.1)
Basophils Relative: 1 %
Eosinophils Absolute: 0.4 10*3/uL (ref 0.0–0.5)
Eosinophils Relative: 8 %
HCT: 28.5 % — ABNORMAL LOW (ref 36.0–46.0)
Hemoglobin: 9.5 g/dL — ABNORMAL LOW (ref 12.0–15.0)
Immature Granulocytes: 0 %
Lymphocytes Relative: 10 %
Lymphs Abs: 0.5 10*3/uL — ABNORMAL LOW (ref 0.7–4.0)
MCH: 31.4 pg (ref 26.0–34.0)
MCHC: 33.3 g/dL (ref 30.0–36.0)
MCV: 94.1 fL (ref 80.0–100.0)
Monocytes Absolute: 0.7 10*3/uL (ref 0.1–1.0)
Monocytes Relative: 14 %
Neutro Abs: 3.6 10*3/uL (ref 1.7–7.7)
Neutrophils Relative %: 67 %
Platelet Count: 138 10*3/uL — ABNORMAL LOW (ref 150–400)
RBC: 3.03 MIL/uL — ABNORMAL LOW (ref 3.87–5.11)
RDW: 14.1 % (ref 11.5–15.5)
WBC Count: 5.4 10*3/uL (ref 4.0–10.5)
nRBC: 0 % (ref 0.0–0.2)

## 2021-05-20 LAB — TOTAL PROTEIN, URINE DIPSTICK: Protein, ur: >2000 mg/dL — AB

## 2021-05-20 MED ORDER — SODIUM CHLORIDE 0.9 % IV SOLN
10.0000 mg | Freq: Once | INTRAVENOUS | Status: AC
  Administered 2021-05-20: 10 mg via INTRAVENOUS
  Filled 2021-05-20: qty 10

## 2021-05-20 MED ORDER — ATROPINE SULFATE 1 MG/ML IV SOLN
1.0000 mg | Freq: Once | INTRAVENOUS | Status: AC | PRN
  Administered 2021-05-20: 1 mg via INTRAVENOUS
  Filled 2021-05-20: qty 1

## 2021-05-20 MED ORDER — SODIUM CHLORIDE 0.9 % IV SOLN: Freq: Once | INTRAVENOUS | Status: AC

## 2021-05-20 MED ORDER — SODIUM CHLORIDE 0.9 % IV SOLN
125.0000 mg/m2 | Freq: Once | INTRAVENOUS | Status: AC
  Administered 2021-05-20: 180 mg via INTRAVENOUS
  Filled 2021-05-20: qty 9

## 2021-05-20 MED ORDER — PALONOSETRON HCL INJECTION 0.25 MG/5ML
0.2500 mg | Freq: Once | INTRAVENOUS | Status: AC
  Administered 2021-05-20: 0.25 mg via INTRAVENOUS
  Filled 2021-05-20: qty 5

## 2021-05-20 NOTE — Progress Notes (Signed)
? ?[facility] [address]  ?[place], [place] [address] ?[phone] ? ? ?Interval Evaluation ? ?Date of Service: 05/20/21 ?Patient Name: Jillian Gomez ?Patient MRN: [id number] ?Patient DOB: [birth year] ?Provider: Ventura Sellers, MD ? ?Identifying Statement:  ?Jillian Gomez is a 66 y.o. female with left occipital glioblastoma  ? ?Oncologic History: ?Oncology History  ?Glioblastoma with isocitrate dehydrogenase gene wildtype ([facility])  ?12/23/2018 Surgery  ? Craniotomy, left occipital resection by Dr. Kathyrn Sheriff.  Path is GBM IDH-wt ?  ?01/23/2019 - 03/03/2019 Radiation Therapy  ? IMRT with concurrent Temozolomide 46m/m2 ?  ?03/28/2019 - 09/04/2019 Chemotherapy  ? 5 cycles of adjuvant 5-day Temozolomide 2087mm2 ? ?  ?09/01/2019 Progression  ? Progression of disease #1 ?  ?09/19/2019 -  Chemotherapy  ? Begins second line therapy with oral CCNU 9069m2 q6 weeks, concurrent Avastin 58m78m IV q2 weeks ?  ?04/30/2020 -  Chemotherapy  ? Completes 4th cycle of CCNU/Avastin, transitions to Avastin monotherapy due to cytopenias ?  ?08/27/2020 -  Chemotherapy  ? Resumes CCNU+Avastin after periventricular DWI signal change noted on MRI ? ?  ?12/14/2020 Progression  ? Progression of disease #2 ?  ?12/19/2020 - 04/01/2021 Chemotherapy  ? Initiates daily metronomic Temodar 50mg75mwith Avastin 58mg/56m2 weeks ?  ?03/30/2021 Progression  ? Progression of disease #3 ?  ?04/08/2021 -  Chemotherapy  ? Patient is on Treatment Plan : BRAIN GBM [facility] Irinotecan + Bevacizumab D1,15 q 28d  ? ?   ? ? ?Biomarkers: ? ?MGMT Unknown.  ?IDH 1/2 Wild type.  ?EGFR Unknown  ?TERT Unknown  ? ?Interval History: ? Jillian Gomez today for planned avastin and irinotecan infusion.  Energy is a bit improved after the transfusion two weeks ago.  Denies new or progressive changes otherwise.  No right sided weakness. No seizures or headaches. ? ?H+P (01/09/19) Patient presented to medical attention in early December with ~2  months history of right sided visual impairment.  She describes fuzzy or blurry vision on that side which was progressive over time, leading to at least one fall.  MRI brain demonstrated enhancing left occipital mass; this was subsequently resected by Dr. NundkuKathyrn Sheriff/18/20.  She had no issues with surgery and has completed her steroid taper.  Continues on keppra daily but no history of any seizure. She has returned to work with only modest difficutly.  No issues walking or performing ADLs; lives alone but her sister and other family members are nearby. ? ?Medications: ?Current Outpatient Medications on File Prior to Visit  ?Medication Sig Dispense Refill  ? amLODipine (NORVASC) 2.5 MG tablet Take 1 tablet (2.5 mg total) by mouth daily. 90 tablet 1  ? Biotin 1 MG CAPS Take 1 mg by mouth daily.    ? CALCIUM PO Take 1 tablet by mouth daily.    ? levothyroxine (SYNTHROID) 75 MCG tablet Take 75 mcg by mouth daily.    ? omeprazole (PRILOSEC) 20 MG capsule Take 1 capsule (20 mg total) by mouth daily. 30 capsule 3  ? rosuvastatin (CRESTOR) 10 MG tablet Take 5 mg by mouth at bedtime.    ? doxycycline (VIBRA-TABS) 100 MG tablet Take 1 tablet by mouth daily. (Patient not taking: Reported on 11/05/2020)    ? naproxen sodium (ALEVE) 220 MG tablet Take 1 tablet (220 mg total) by mouth daily as needed (pain). (Patient not taking: Reported on 03/18/2021)    ? ?No current facility-administered medications on file prior to visit.  ? ? ?  Allergies:  ?Allergies  ?Allergen Reactions  ? Etodolac Other (See Comments)  ?  GI upset  ? Macrobid [Nitrofurantoin] Rash  ? ?Past Medical History:  ?Past Medical History:  ?Diagnosis Date  ? High cholesterol   ? Hypothyroidism   ? Joint pain   ? Thrombocytopenia ([facility] [facility] [place]) 02/26/2020  ? ?Past Surgical History:  ?Past Surgical History:  ?Procedure Laterality Date  ? APPLICATION OF CRANIAL NAVIGATION Left 12/23/2018  ? Procedure: APPLICATION OF CRANIAL NAVIGATION;  Surgeon: Consuella Lose, MD;   Location: [facility];  Service: Neurosurgery;  Laterality: Left;  APPLICATION OF CRANIAL NAVIGATION  ? BREAST BIOPSY Left   ? COLONOSCOPY    ? CRANIOTOMY Left 12/23/2018  ? Procedure: STEREOTACTIC LEFT OCCIPITAL CRANIOTOMY FOR RESECTION OF TUMOR;  Surgeon: Consuella Lose, MD;  Location: [facility];  Service: Neurosurgery;  Laterality: Left;  STEREOTACTIC LEFT OCCIPITAL CRANIOTOMY FOR RESECTION OF TUMOR  ? LASIK    ? ?Social History:  ?Social History  ? ?Socioeconomic History  ? Marital status: Single  ?  Spouse name: Not on file  ? Number of children: Not on file  ? Years of education: Not on file  ? Highest education level: Not on file  ?Occupational History  ? Not on file  ?Tobacco Use  ? Smoking status: Never  ? Smokeless tobacco: Never  ?Vaping Use  ? Vaping Use: Never used  ?Substance and Sexual Activity  ? Alcohol use: Not Currently  ? Drug use: Never  ? Sexual activity: Not on file  ?Other Topics Concern  ? Not on file  ?Social History Narrative  ? Not on file  ? ?Social Determinants of Health  ? ?Financial Resource Strain: Not on file  ?Food Insecurity: Not on file  ?Transportation Needs: Not on file  ?Physical Activity: Not on file  ?Stress: Not on file  ?Social Connections: Not on file  ?Intimate Partner Violence: Not on file  ? ?Family History: No family history on file. ? ?Review of Systems: ?Constitutional: Denies fevers, chills or abnormal weight loss ?Eyes: Denies blurriness of vision ?Gastrointestinal:  Denies nausea, constipation, diarrhea ?GU: Denies dysuria or incontinence ?Skin: Denies abnormal skin rashes ?Musculoskeletal: Denies joint pain, back or neck discomfort.  ?Behavioral/Psych: Denies anxiety, mood instability ? ?Physical Exam: ?Vitals:  ? 05/20/21 1130  ?BP: (!) 145/89  ?Pulse: 90  ?Resp: 15  ?Temp: 97.8 ?F (36.6 ?C)  ?SpO2: 100%  ? ?KPS: 80. ?General: Alert, cooperative, pleasant, in no acute distress ?Head: Craniotomy scar noted, dry and intact. ?EENT: No conjunctival injection or scleral  icterus. Oral mucosa moist ?Lungs: Resp effort normal ?Cardiac: Regular rate and rhythm ?Abdomen: Soft, non-distended abdomen ?Skin: No rashes cyanosis or petechiae. ?Extremities: Left shin skin level cut, bleeding ? ?Neurologic Exam: ?Mental Status: Awake, alert, attentive to examiner. Oriented to self and environment. Language is notable for mild transcortical expressive dyshpasia. Cranial Nerves: Visual acuity is grossly normal. Right homonymous hemianopia. Extra-ocular movements intact. No ptosis. Face is symmetric, tongue midline. ?Motor: Tone and bulk are normal. Power is full in both arms and legs. Reflexes are symmetric, no pathologic reflexes present. Intact finger to nose bilaterally ?Sensory: Intact to light touch and temperature ?Gait: Normal and tandem gait is deferred. ? ? ?Labs: ?I have reviewed the data as listed ?   ?Component Value Date/Time  ? NA 138 05/20/2021 1106  ? K 4.4 05/20/2021 1106  ? CL 106 05/20/2021 1106  ? CO2 26 05/20/2021 1106  ? GLUCOSE 87 05/20/2021 1106  ? BUN 46 (H)  05/20/2021 1106  ? CREATININE 1.59 (H) 05/20/2021 1106  ? CALCIUM 9.1 05/20/2021 1106  ? PROT 6.6 05/20/2021 1106  ? ALBUMIN 3.7 05/20/2021 1106  ? AST 24 05/20/2021 1106  ? ALT 14 05/20/2021 1106  ? ALKPHOS 103 05/20/2021 1106  ? BILITOT 0.5 05/20/2021 1106  ? GFRNONAA 36 (L) 05/20/2021 1106  ? GFRAA >60 10/03/2019 1133  ? ?Lab Results  ?Component Value Date  ? WBC 5.4 05/20/2021  ? NEUTROABS 3.6 05/20/2021  ? HGB 9.5 (L) 05/20/2021  ? HCT 28.5 (L) 05/20/2021  ? MCV 94.1 05/20/2021  ? PLT 138 (L) 05/20/2021  ? ?Assessment/Plan ?Glioblastoma, IDH-1 WT ? ?Jillian Gomez is clinically stable today.  Labs are notable today for advanced proteinuria.  Hemoglobin is improved. ? ?She is clear to proceed with irinotecan today, but avastin will be held due to proteinuria. ? ?Chemotherapy should be held for the following: ?? ANC less than 1,000 ?? Platelets less than 100,000 ?? LFT or creatinine greater than 2x ULN ?? If  clinical concerns/contraindications develop ? ?Avastin should be held for the following: ? ?? ANC less than 500 ?? Platelets less than 50,000 ?? LFT or creatinine greater than 2x ULN ?? If clinical concerns/contr

## 2021-05-20 NOTE — Progress Notes (Signed)
Per Dr. Mickeal Skinner okay to treat today with creatinie 1.59 ?

## 2021-05-20 NOTE — Progress Notes (Signed)
Nutrition Follow-up: ? ?Patient with left occipital glioblastoma. S/p left craniotomy (2020). She is currently receiving Irinotecan + Bevacizumab q14d. Bevacizumab held today due to proteinuria. ? ?Met with patient during infusion. She reports appetite is about the same. Patient has increased intake of Ensure, she reports 2 Ensure Complete daily (350 kcal, 30 grams of protein). Patient is able to eat something small during the day (mac/cheese, cottage cheese and fruit). Patient does not like the taste of water, drinks a bottle or less. She likes fruit punch. Patient denies nausea, vomiting, constipation. She takes imodium as needed for diarrhea which typically occurs 3-4 days following treatment.  ? ? ? ?Medications: reviewed  ? ?Labs: BUN 46, Cr 1.59 ? ?Anthropometrics: Weight 98 lb 6.4 oz today  ? ?4/25 - 97 lb 6.4 oz ?4/04 - 96 lb 12.8 oz  ?3/14 - 98 lb 3.2 oz ? ?NUTRITION DIAGNOSIS: Severe malnutrition continues  ? ? ? ?INTERVENTION:  ?Continue drinking Ensure Complete twice daily - coupons provided ?Encouraged high calorie, high protein snacks - handout with ideas provided ?Discussed importance of hydration and educated on foods with fluids ?Support and encouragement provided  ?  ? ?MONITORING, EVALUATION, GOAL: weight trends, intake ? ? ?NEXT VISIT: To be scheduled ? ? ? ?

## 2021-05-20 NOTE — Patient Instructions (Signed)
[facility]  Discharge Instructions: ?Thank you for choosing [facility] to provide your oncology and hematology care.  ? ?If you have a lab appointment with the Cancer Center, please go directly to the Cancer Center and check in at the registration area. ?  ?Wear comfortable clothing and clothing appropriate for easy access to any Portacath or PICC line.  ? ?We strive to give you quality time with your provider. You may need to reschedule your appointment if you arrive late (15 or more minutes).  Arriving late affects you and other patients whose appointments are after yours.  Also, if you miss three or more appointments without notifying the office, you may be dismissed from the clinic at the provider?s discretion.    ?  ?For prescription refill requests, have your pharmacy contact our office and allow 72 hours for refills to be completed.   ? ?Today you received the following chemotherapy and/or immunotherapy agents: Irinotecan    ?  ?To help prevent nausea and vomiting after your treatment, we encourage you to take your nausea medication as directed. ? ?BELOW ARE SYMPTOMS THAT SHOULD BE REPORTED IMMEDIATELY: ?*FEVER GREATER THAN 100.4 F (38 ?C) OR HIGHER ?*CHILLS OR SWEATING ?*NAUSEA AND VOMITING THAT IS NOT CONTROLLED WITH YOUR NAUSEA MEDICATION ?*UNUSUAL SHORTNESS OF BREATH ?*UNUSUAL BRUISING OR BLEEDING ?*URINARY PROBLEMS (pain or burning when urinating, or frequent urination) ?*BOWEL PROBLEMS (unusual diarrhea, constipation, pain near the anus) ?TENDERNESS IN MOUTH AND THROAT WITH OR WITHOUT PRESENCE OF ULCERS (sore throat, sores in mouth, or a toothache) ?UNUSUAL RASH, SWELLING OR PAIN  ?UNUSUAL VAGINAL DISCHARGE OR ITCHING  ? ?Items with * indicate a potential emergency and should be followed up as soon as possible or go to the Emergency Department if any problems should occur. ? ?Please show the CHEMOTHERAPY ALERT CARD or IMMUNOTHERAPY ALERT CARD at check-in to  the Emergency Department and triage nurse. ? ?Should you have questions after your visit or need to cancel or reschedule your appointment, please contact [facility]  Dept: [phone]  and follow the prompts.  Office hours are 8:00 a.m. to 4:30 p.m. Monday - Friday. Please note that voicemails left after 4:00 p.m. may not be returned until the following business day.  We are closed weekends and major holidays. You have access to a nurse at all times for urgent questions. Please call the main number to the clinic Dept: [phone] and follow the prompts. ? ? ?For any non-urgent questions, you may also contact your provider using MyChart. We now offer e-Visits for anyone 18 and older to request care online for non-urgent symptoms. For details visit [email or web address]. ?  ?Also download the MyChart app! Go to the app store, search "MyChart", open the app, select [facility], and log in with your MyChart username and password. ? ?Due to Covid, a mask is required upon entering the hospital/clinic. If you do not have a mask, one will be given to you upon arrival. For doctor visits, patients may have 1 support Zykeria Laguardia aged 18 or older with them. For treatment visits, patients cannot have anyone with them due to current Covid guidelines and our immunocompromised population.  ? ?

## 2021-05-20 NOTE — Progress Notes (Signed)
Per Dr Mickeal Skinner patient is able to get Irinotecan today only.  Does not meet criteria for Avastin.   ?

## 2021-05-21 ENCOUNTER — Telehealth: Payer: Self-pay | Admitting: Internal Medicine

## 2021-05-21 NOTE — Telephone Encounter (Signed)
Scheduled per 5/16 los, pt has been called and confirmed  ?

## 2021-05-27 DIAGNOSIS — M85851 Other specified disorders of bone density and structure, right thigh: Secondary | ICD-10-CM | POA: Diagnosis not present

## 2021-05-27 DIAGNOSIS — M8589 Other specified disorders of bone density and structure, multiple sites: Secondary | ICD-10-CM | POA: Diagnosis not present

## 2021-05-27 DIAGNOSIS — Z1231 Encounter for screening mammogram for malignant neoplasm of breast: Secondary | ICD-10-CM | POA: Diagnosis not present

## 2021-06-03 ENCOUNTER — Other Ambulatory Visit: Payer: Self-pay

## 2021-06-03 ENCOUNTER — Encounter: Payer: Self-pay | Admitting: Internal Medicine

## 2021-06-03 ENCOUNTER — Inpatient Hospital Stay: Payer: BC Managed Care – PPO

## 2021-06-03 ENCOUNTER — Inpatient Hospital Stay (HOSPITAL_BASED_OUTPATIENT_CLINIC_OR_DEPARTMENT_OTHER): Payer: BC Managed Care – PPO | Admitting: Internal Medicine

## 2021-06-03 VITALS — BP 143/73 | HR 80 | Temp 97.9°F | Resp 18 | Wt 101.8 lb

## 2021-06-03 DIAGNOSIS — Z923 Personal history of irradiation: Secondary | ICD-10-CM | POA: Diagnosis not present

## 2021-06-03 DIAGNOSIS — C719 Malignant neoplasm of brain, unspecified: Secondary | ICD-10-CM

## 2021-06-03 DIAGNOSIS — R809 Proteinuria, unspecified: Secondary | ICD-10-CM | POA: Diagnosis not present

## 2021-06-03 DIAGNOSIS — Z5111 Encounter for antineoplastic chemotherapy: Secondary | ICD-10-CM | POA: Diagnosis not present

## 2021-06-03 DIAGNOSIS — C714 Malignant neoplasm of occipital lobe: Secondary | ICD-10-CM | POA: Diagnosis not present

## 2021-06-03 LAB — CBC WITH DIFFERENTIAL (CANCER CENTER ONLY)
Abs Immature Granulocytes: 0.01 10*3/uL (ref 0.00–0.07)
Basophils Absolute: 0 10*3/uL (ref 0.0–0.1)
Basophils Relative: 1 %
Eosinophils Absolute: 0.3 10*3/uL (ref 0.0–0.5)
Eosinophils Relative: 5 %
HCT: 25.1 % — ABNORMAL LOW (ref 36.0–46.0)
Hemoglobin: 8.4 g/dL — ABNORMAL LOW (ref 12.0–15.0)
Immature Granulocytes: 0 %
Lymphocytes Relative: 11 %
Lymphs Abs: 0.7 10*3/uL (ref 0.7–4.0)
MCH: 31.6 pg (ref 26.0–34.0)
MCHC: 33.5 g/dL (ref 30.0–36.0)
MCV: 94.4 fL (ref 80.0–100.0)
Monocytes Absolute: 0.8 10*3/uL (ref 0.1–1.0)
Monocytes Relative: 13 %
Neutro Abs: 4.6 10*3/uL (ref 1.7–7.7)
Neutrophils Relative %: 70 %
Platelet Count: 121 10*3/uL — ABNORMAL LOW (ref 150–400)
RBC: 2.66 MIL/uL — ABNORMAL LOW (ref 3.87–5.11)
RDW: 14.6 % (ref 11.5–15.5)
WBC Count: 6.6 10*3/uL (ref 4.0–10.5)
nRBC: 0 % (ref 0.0–0.2)

## 2021-06-03 LAB — CMP (CANCER CENTER ONLY)
ALT: 11 U/L (ref 0–44)
AST: 18 U/L (ref 15–41)
Albumin: 3.6 g/dL (ref 3.5–5.0)
Alkaline Phosphatase: 66 U/L (ref 38–126)
Anion gap: 6 (ref 5–15)
BUN: 34 mg/dL — ABNORMAL HIGH (ref 8–23)
CO2: 27 mmol/L (ref 22–32)
Calcium: 9.2 mg/dL (ref 8.9–10.3)
Chloride: 104 mmol/L (ref 98–111)
Creatinine: 1.48 mg/dL — ABNORMAL HIGH (ref 0.44–1.00)
GFR, Estimated: 39 mL/min — ABNORMAL LOW (ref >60)
Glucose, Bld: 87 mg/dL (ref 70–99)
Potassium: 3.7 mmol/L (ref 3.5–5.1)
Sodium: 137 mmol/L (ref 135–145)
Total Bilirubin: 0.5 mg/dL (ref 0.3–1.2)
Total Protein: 6.1 g/dL — ABNORMAL LOW (ref 6.5–8.1)

## 2021-06-03 LAB — TOTAL PROTEIN, URINE DIPSTICK: Protein, ur: >2000 mg/dL — AB

## 2021-06-03 MED ORDER — ATROPINE SULFATE 1 MG/ML IV SOLN
1.0000 mg | Freq: Once | INTRAVENOUS | Status: AC | PRN
  Administered 2021-06-03: 1 mg via INTRAVENOUS
  Filled 2021-06-03: qty 1

## 2021-06-03 MED ORDER — IRINOTECAN HCL CHEMO INJECTION 100 MG/5ML
125.0000 mg/m2 | Freq: Once | INTRAVENOUS | Status: AC
  Administered 2021-06-03: 180 mg via INTRAVENOUS
  Filled 2021-06-03: qty 9

## 2021-06-03 MED ORDER — SODIUM CHLORIDE 0.9 % IV SOLN
10.0000 mg | Freq: Once | INTRAVENOUS | Status: AC
  Administered 2021-06-03: 10 mg via INTRAVENOUS
  Filled 2021-06-03: qty 10

## 2021-06-03 MED ORDER — SODIUM CHLORIDE 0.9 % IV SOLN: Freq: Once | INTRAVENOUS | Status: AC

## 2021-06-03 MED ORDER — PALONOSETRON HCL INJECTION 0.25 MG/5ML
0.2500 mg | Freq: Once | INTRAVENOUS | Status: AC
  Administered 2021-06-03: 0.25 mg via INTRAVENOUS
  Filled 2021-06-03: qty 5

## 2021-06-03 NOTE — Progress Notes (Signed)
Per Dr. Mickeal Skinner - no avastin treatment today. Proceed with irinotecan only.

## 2021-06-03 NOTE — Patient Instructions (Signed)
[facility]  Discharge Instructions: Thank you for choosing [facility] to provide your oncology and hematology care.   If you have a lab appointment with the Cancer Center, please go directly to the Cancer Center and check in at the registration area.   Wear comfortable clothing and clothing appropriate for easy access to any Portacath or PICC line.   We strive to give you quality time with your provider. You may need to reschedule your appointment if you arrive late (15 or more minutes).  Arriving late affects you and other patients whose appointments are after yours.  Also, if you miss three or more appointments without notifying the office, you may be dismissed from the clinic at the provider's discretion.      For prescription refill requests, have your pharmacy contact our office and allow 72 hours for refills to be completed.    Today you received the following chemotherapy and/or immunotherapy agents Irinotecan (Camptosar)      To help prevent nausea and vomiting after your treatment, we encourage you to take your nausea medication as directed.  BELOW ARE SYMPTOMS THAT SHOULD BE REPORTED IMMEDIATELY: *FEVER GREATER THAN 100.4 F (38 C) OR HIGHER *CHILLS OR SWEATING *NAUSEA AND VOMITING THAT IS NOT CONTROLLED WITH YOUR NAUSEA MEDICATION *UNUSUAL SHORTNESS OF BREATH *UNUSUAL BRUISING OR BLEEDING *URINARY PROBLEMS (pain or burning when urinating, or frequent urination) *BOWEL PROBLEMS (unusual diarrhea, constipation, pain near the anus) TENDERNESS IN MOUTH AND THROAT WITH OR WITHOUT PRESENCE OF ULCERS (sore throat, sores in mouth, or a toothache) UNUSUAL RASH, SWELLING OR PAIN  UNUSUAL VAGINAL DISCHARGE OR ITCHING   Items with * indicate a potential emergency and should be followed up as soon as possible or go to the Emergency Department if any problems should occur.  Please show the CHEMOTHERAPY ALERT CARD or IMMUNOTHERAPY ALERT CARD at  check-in to the Emergency Department and triage nurse.  Should you have questions after your visit or need to cancel or reschedule your appointment, please contact [facility]  Dept: [phone]  and follow the prompts.  Office hours are 8:00 a.m. to 4:30 p.m. Monday - Friday. Please note that voicemails left after 4:00 p.m. may not be returned until the following business day.  We are closed weekends and major holidays. You have access to a nurse at all times for urgent questions. Please call the main number to the clinic Dept: [phone] and follow the prompts.   For any non-urgent questions, you may also contact your provider using MyChart. We now offer e-Visits for anyone 18 and older to request care online for non-urgent symptoms. For details visit [email or web address].   Also download the MyChart app! Go to the app store, search "MyChart", open the app, select [facility], and log in with your MyChart username and password.  Due to Covid, a mask is required upon entering the hospital/clinic. If you do not have a mask, one will be given to you upon arrival. For doctor visits, patients may have 1 support person aged 18 or older with them. For treatment visits, patients cannot have anyone with them due to current Covid guidelines and our immunocompromised population.   

## 2021-06-03 NOTE — Progress Notes (Signed)
No avastin treatment today 06/03/2021 per Dr Mickeal Skinner because of current urine protein.  Ok to proceed with Irinotecan today.

## 2021-06-03 NOTE — Progress Notes (Signed)
[facility] [place], [place] [address] [phone]   Interval Evaluation  Date of Service: 06/03/21 Patient Name: Jillian Gomez Patient MRN: [id number] Patient DOB: Aug 12, 1955 Provider: Ventura Sellers, MD  Identifying Statement:  ILANNA Gomez is a 66 y.o. female with left occipital glioblastoma   Oncologic History: Oncology History  Glioblastoma with isocitrate dehydrogenase gene wildtype ([facility])  12/23/2018 Surgery   Craniotomy, left occipital resection by Dr. Kathyrn Sheriff.  Path is GBM IDH-wt   01/23/2019 - 03/03/2019 Radiation Therapy   IMRT with concurrent Temozolomide 39m/m2   03/28/2019 - 09/04/2019 Chemotherapy   5 cycles of adjuvant 5-day Temozolomide 2054mm2    09/01/2019 Progression   Progression of disease #1   09/19/2019 -  Chemotherapy   Begins second line therapy with oral CCNU 9011m2 q6 weeks, concurrent Avastin 37m65m IV q2 weeks   04/30/2020 -  Chemotherapy   Completes 4th cycle of CCNU/Avastin, transitions to Avastin monotherapy due to cytopenias   08/27/2020 -  Chemotherapy   Resumes CCNU+Avastin after periventricular DWI signal change noted on MRI    12/14/2020 Progression   Progression of disease #2   12/19/2020 - 04/01/2021 Chemotherapy   Initiates daily metronomic Temodar 50mg66mwith Avastin 37mg/19m2 weeks   03/30/2021 Progression   Progression of disease #3   04/08/2021 -  Chemotherapy   Patient is on Treatment Plan : BRAIN GBM [facility] Irinotecan + Bevacizumab D1,15 q 28d        Biomarkers:  MGMT Unknown.  IDH 1/2 Wild type.  EGFR Unknown  TERT Unknown   Interval History:  [name] today for cycle #4 avastin and irinotecan infusion.  No changes described today, remains relatively active.  No right sided weakness. No seizures or headaches.  H+P (01/09/19) Patient presented to medical attention in early December with ~2 months history of right sided visual impairment.   She describes fuzzy or blurry vision on that side which was progressive over time, leading to at least one fall.  MRI brain demonstrated enhancing left occipital mass; this was subsequently resected by Dr. NundkuKathyrn Sheriff/18/20.  She had no issues with surgery and has completed her steroid taper.  Continues on keppra daily but no history of any seizure. She has returned to work with only modest difficutly.  No issues walking or performing ADLs; lives alone but her sister and other family members are nearby.  Medications: Current Outpatient Medications on File Prior to Visit  Medication Sig Dispense Refill   amLODipine (NORVASC) 2.5 MG tablet Take 1 tablet (2.5 mg total) by mouth daily. 90 tablet 1   Biotin 1 MG CAPS Take 1 mg by mouth daily.     CALCIUM PO Take 1 tablet by mouth daily.     doxycycline (VIBRA-TABS) 100 MG tablet Take 1 tablet by mouth daily. (Patient not taking: Reported on 11/05/2020)     levothyroxine (SYNTHROID) 75 MCG tablet Take 75 mcg by mouth daily.     naproxen sodium (ALEVE) 220 MG tablet Take 1 tablet (220 mg total) by mouth daily as needed (pain). (Patient not taking: Reported on 03/18/2021)     omeprazole (PRILOSEC) 20 MG capsule Take 1 capsule (20 mg total) by mouth daily. 30 capsule 3   rosuvastatin (CRESTOR) 10 MG tablet Take 5 mg by mouth at bedtime.     No current facility-administered medications on file prior to visit.    Allergies:  Allergies  Allergen Reactions  Etodolac Other (See Comments)    GI upset   Macrobid [Nitrofurantoin] Rash   Past Medical History:  Past Medical History:  Diagnosis Date   High cholesterol    Hypothyroidism    Joint pain    Thrombocytopenia ([facility]) 02/26/2020   Past Surgical History:  Past Surgical History:  Procedure Laterality Date   APPLICATION OF CRANIAL NAVIGATION Left 12/23/2018   Procedure: APPLICATION OF CRANIAL NAVIGATION;  Surgeon: Consuella Lose, MD;  Location: [facility];  Service: Neurosurgery;  Laterality:  Left;  APPLICATION OF CRANIAL NAVIGATION   BREAST BIOPSY Left    COLONOSCOPY     CRANIOTOMY Left 12/23/2018   Procedure: STEREOTACTIC LEFT OCCIPITAL CRANIOTOMY FOR RESECTION OF TUMOR;  Surgeon: Consuella Lose, MD;  Location: [facility];  Service: Neurosurgery;  Laterality: Left;  STEREOTACTIC LEFT OCCIPITAL CRANIOTOMY FOR RESECTION OF TUMOR   LASIK     Social History:  Social History   Socioeconomic History   Marital status: Single    Spouse name: Not on file   Number of children: Not on file   Years of education: Not on file   Highest education level: Not on file  Occupational History   Not on file  Tobacco Use   Smoking status: Never   Smokeless tobacco: Never  Vaping Use   Vaping Use: Never used  Substance and Sexual Activity   Alcohol use: Not Currently   Drug use: Never   Sexual activity: Not on file  Other Topics Concern   Not on file  Social History Narrative   Not on file   Social Determinants of Health   Financial Resource Strain: Not on file  Food Insecurity: Not on file  Transportation Needs: Not on file  Physical Activity: Not on file  Stress: Not on file  Social Connections: Not on file  Intimate Partner Violence: Not on file   Family History: No family history on file.  Review of Systems: Constitutional: Denies fevers, chills or abnormal weight loss Eyes: Denies blurriness of vision Gastrointestinal:  Denies nausea, constipation, diarrhea GU: Denies dysuria or incontinence Skin: Denies abnormal skin rashes Musculoskeletal: Denies joint pain, back or neck discomfort.  Behavioral/Psych: Denies anxiety, mood instability  Physical Exam: Vitals:   06/03/21 0928  BP: (!) 143/73  Pulse: 80  Resp: 18  Temp: 97.9 F (36.6 C)  SpO2: 100%    KPS: 80. General: Alert, cooperative, pleasant, in no acute distress Head: Craniotomy scar noted, dry and intact. EENT: No conjunctival injection or scleral icterus. Oral mucosa moist Lungs: Resp effort  normal Cardiac: Regular rate and rhythm Abdomen: Soft, non-distended abdomen Skin: No rashes cyanosis or petechiae. Extremities: Left shin skin level cut, bleeding  Neurologic Exam: Mental Status: Awake, alert, attentive to examiner. Oriented to self and environment. Language is notable for mild transcortical expressive dyshpasia. Cranial Nerves: Visual acuity is grossly normal. Right homonymous hemianopia. Extra-ocular movements intact. No ptosis. Face is symmetric, tongue midline. Motor: Tone and bulk are normal. Power is full in both arms and legs. Reflexes are symmetric, no pathologic reflexes present. Intact finger to nose bilaterally Sensory: Intact to light touch and temperature Gait: Normal and tandem gait is deferred.   Labs: I have reviewed the data as listed    Component Value Date/Time   NA 138 05/20/2021 1106   K 4.4 05/20/2021 1106   CL 106 05/20/2021 1106   CO2 26 05/20/2021 1106   GLUCOSE 87 05/20/2021 1106   BUN 46 (H) 05/20/2021 1106   CREATININE 1.59 (H)  05/20/2021 1106   CALCIUM 9.1 05/20/2021 1106   PROT 6.6 05/20/2021 1106   ALBUMIN 3.7 05/20/2021 1106   AST 24 05/20/2021 1106   ALT 14 05/20/2021 1106   ALKPHOS 103 05/20/2021 1106   BILITOT 0.5 05/20/2021 1106   GFRNONAA 36 (L) 05/20/2021 1106   GFRAA >60 10/03/2019 1133   Lab Results  Component Value Date   WBC 6.6 06/03/2021   NEUTROABS PENDING 06/03/2021   HGB 8.4 (L) 06/03/2021   HCT 25.1 (L) 06/03/2021   MCV 94.4 06/03/2021   PLT 121 (L) 06/03/2021   Assessment/Plan Glioblastoma, IDH-1 WT  GENTRI GUARDADO is clinically stable today.  Labs are notable today for advanced proteinuria.  Blood counts are stable.  She is clear to proceed with irinotecan today, but avastin will again be held due to proteinuria.  Chemotherapy should be held for the following:  ANC less than 1,000  Platelets less than 100,000  LFT or creatinine greater than 2x ULN  If clinical concerns/contraindications  develop  Avastin should be held for the following:   ANC less than 500  Platelets less than 50,000  LFT or creatinine greater than 2x ULN  If clinical concerns/contraindications develop  May con't amlodipine 2.63m daily in the interim.  We ask that RJANIELLE MITTELSTADTreturn to clinic in 3 weeks for next cycle of cpt-11/avastin.  MRI scheduled for 06/20/21.  All questions were answered. The patient knows to call the clinic with any problems, questions or concerns. No barriers to learning were detected.  I have spent a total of 30 minutes of face-to-face and non-face-to-face time, excluding clinical staff time, preparing to see patient, ordering tests and/or medications, counseling the patient, and independently interpreting results and communicating results to the patient/family/caregiver    ZVentura Sellers MD Medical Director of Neuro-Oncology [facility] [place]05/30/23 9:30 AM

## 2021-06-04 ENCOUNTER — Telehealth: Payer: Self-pay | Admitting: Internal Medicine

## 2021-06-04 NOTE — Telephone Encounter (Signed)
Per 5/30 los called pt and left message about next appointment call back number was left

## 2021-06-05 ENCOUNTER — Other Ambulatory Visit: Payer: BC Managed Care – PPO

## 2021-06-05 ENCOUNTER — Ambulatory Visit: Payer: BC Managed Care – PPO

## 2021-06-05 ENCOUNTER — Ambulatory Visit: Payer: BC Managed Care – PPO | Admitting: Internal Medicine

## 2021-06-12 ENCOUNTER — Other Ambulatory Visit: Payer: Self-pay | Admitting: *Deleted

## 2021-06-12 MED ORDER — ONDANSETRON HCL 8 MG PO TABS: 8.0000 mg | ORAL_TABLET | Freq: Two times a day (BID) | ORAL | 0 refills | Status: AC | PRN

## 2021-06-12 NOTE — Progress Notes (Signed)
Patients sister Jeannene Patella called to report that they needed a refill of zofran but routed to a new pharmacy.  Pharmacy added and reorder placed.

## 2021-06-13 ENCOUNTER — Other Ambulatory Visit: Payer: Self-pay | Admitting: Radiation Therapy

## 2021-06-18 ENCOUNTER — Encounter: Payer: Self-pay | Admitting: Internal Medicine

## 2021-06-21 ENCOUNTER — Ambulatory Visit (HOSPITAL_COMMUNITY)
Admission: RE | Admit: 2021-06-21 | Discharge: 2021-06-21 | Disposition: A | Discharge status: Home / Self Care | Payer: BC Managed Care – PPO | Source: Ambulatory Visit | Attending: Internal Medicine | Admitting: Internal Medicine

## 2021-06-21 DIAGNOSIS — C719 Malignant neoplasm of brain, unspecified: Secondary | ICD-10-CM | POA: Diagnosis not present

## 2021-06-21 IMAGING — MR MR HEAD WO/W CM
13 series · 48 of 48 positions shown · IV contrast (gadavist)
Comparison: Brain MRI [DATE] and earlier.

CLINICAL DATA: 66-year-old female Glioblastoma initially diagnosed
[BL]. IDH wild-type. Restaging.

EXAM:
MRI HEAD WITHOUT AND WITH CONTRAST
TECHNIQUE: Multiplanar, multiecho pulse sequences of the brain and surrounding
structures were obtained without and with intravenous contrast.
CONTRAST:  5mL GADAVIST GADOBUTROL 1 MMOL/ML IV SOLN

[Series 5: DWI · axial · 3.0mm · 1.36mm/px · z∈[-97,+59]mm · 7 of 108 slices shown (1 of 2)]
[im 1/108]
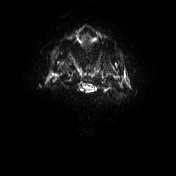
[im 18/108]
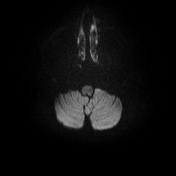
[im 36/108]
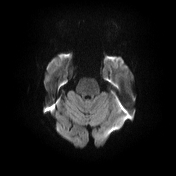
[im 54/108]
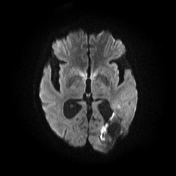
[im 72/108]
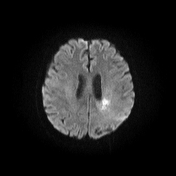
[im 90/108]
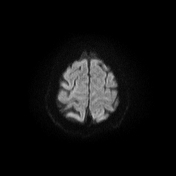
[im 108/108]
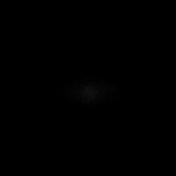

[Series 6: DWI · axial · 3.0mm · 1.36mm/px · z∈[-97,+59]mm · 3 of 54 slices shown (2 of 2)]
[im 1/54]
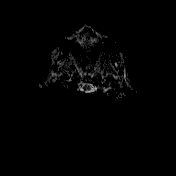
[im 27/54]
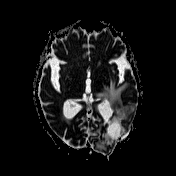
[im 54/54]
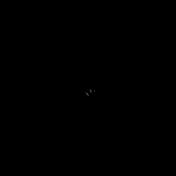

[Series 7: T1 · sagittal · 5.0mm · 0.75mm/px · 1 of 25 slices shown (1 of 2)]
[im 1/25]
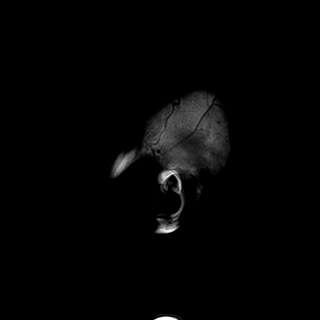

[Series 8: T2 · axial · 5.0mm · 0.62mm/px · z∈[-97,+62]mm · 2 of 26 slices shown]
[im 1/26]
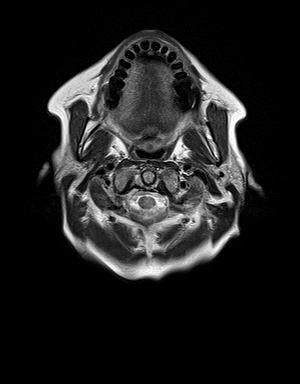
[im 26/26]
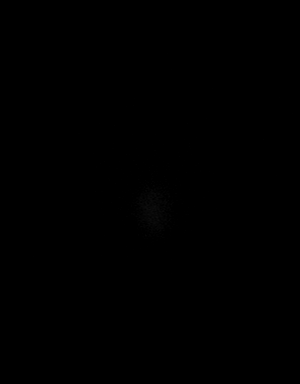

[Series 9: swi_images · axial · 3.0mm · 0.75mm/px · z∈[-98,+64]mm · 3 of 56 slices shown]
[im 1/56]
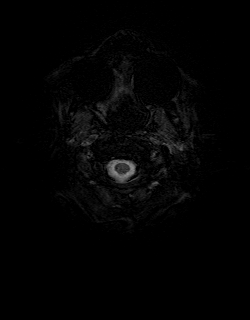
[im 28/56]
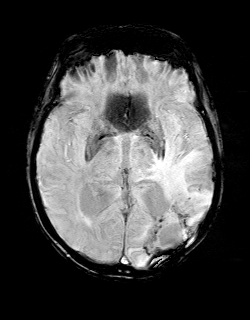
[im 56/56]
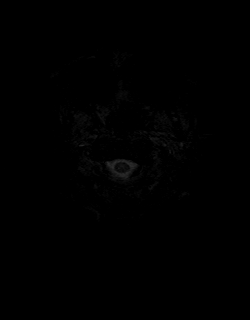

[Series 11: FLAIR · axial · 3.0mm · 0.75mm/px · z∈[-95,+61]mm · 3 of 54 slices shown]
[im 1/54]
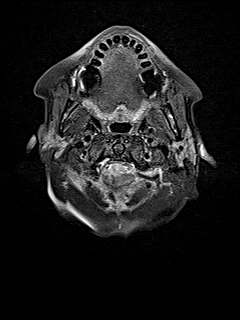
[im 27/54]
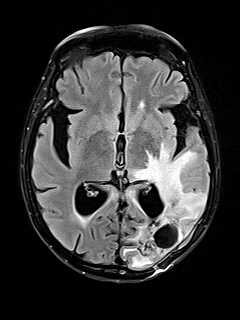
[im 54/54]
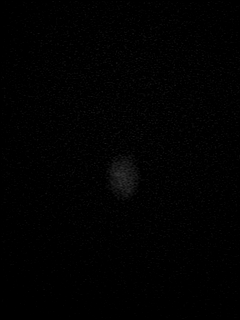

[Series 12: T1 · axial · 1.0mm · 0.94mm/px · z∈[-96,+61]mm · 9 of 160 slices shown (2 of 2)]
[im 1/160]
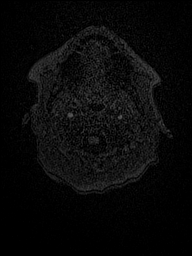
[im 20/160]
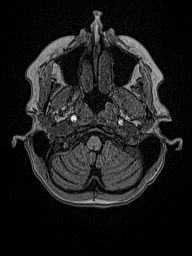
[im 40/160]
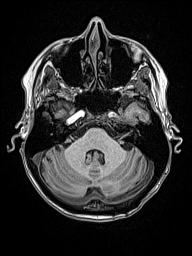
[im 60/160]
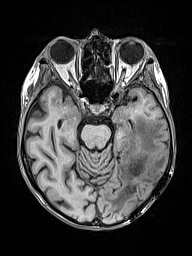
[im 80/160]
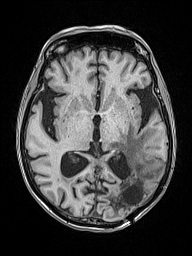
[im 100/160]
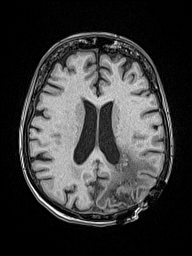
[im 120/160]
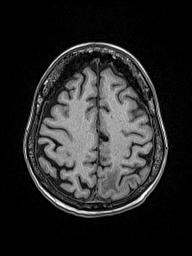
[im 140/160]
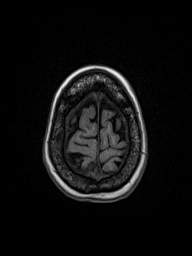
[im 160/160]
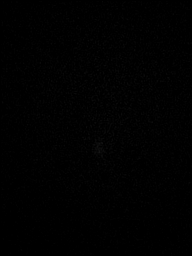

[Series 13: cor dwi_tracew · coronal · 5.0mm · 1.53mm/px · 4 of 60 slices shown]
[im 1/60]
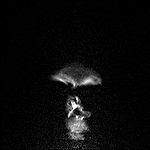
[im 20/60]
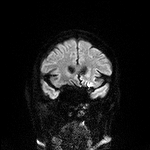
[im 40/60]
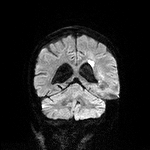
[im 60/60]
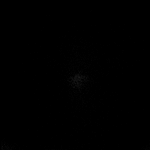

[Series 14: cor dwi_adc · coronal · 5.0mm · 1.53mm/px · 2 of 29 slices shown]
[im 1/29]
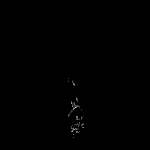
[im 29/29]
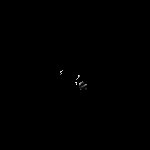

[Series 15: T2 post-contrast · coronal · 5.0mm · 0.57mm/px · 2 of 29 slices shown]
[im 1/29]
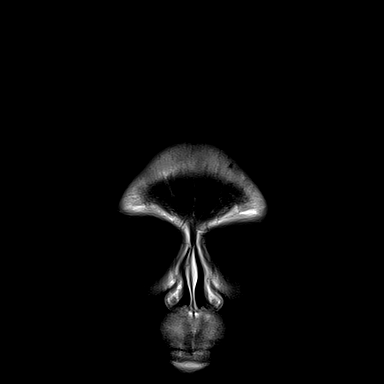
[im 29/29]
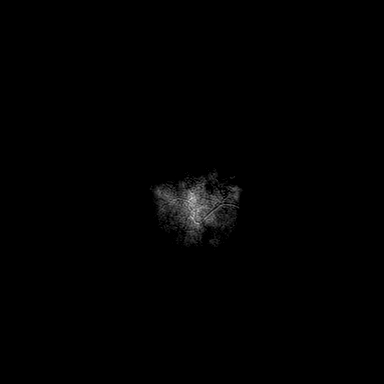

[Series 16: T1 post-contrast · axial · 1.0mm · 0.94mm/px · z∈[-96,+61]mm · 9 of 160 slices shown (1 of 3)]
[im 1/160]
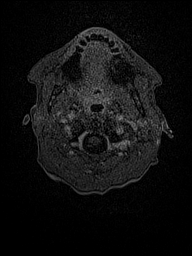
[im 20/160]
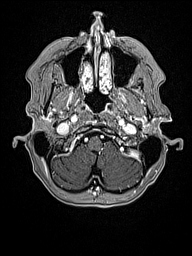
[im 40/160]
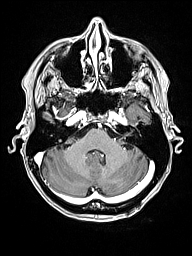
[im 60/160]
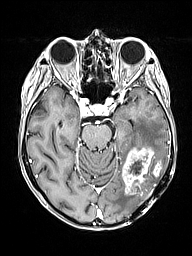
[im 80/160]
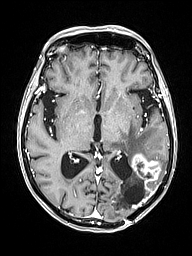
[im 100/160]
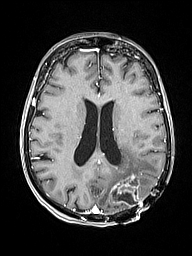
[im 120/160]
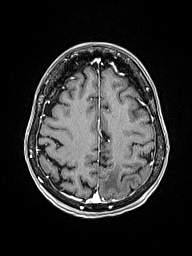
[im 140/160]
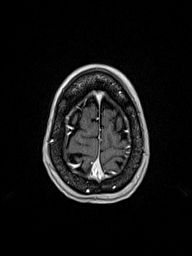
[im 160/160]
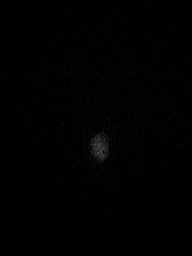

[Series 17: T1 post-contrast · coronal · 5.0mm · 0.43mm/px · 2 of 29 slices shown (2 of 3)]
[im 1/29]
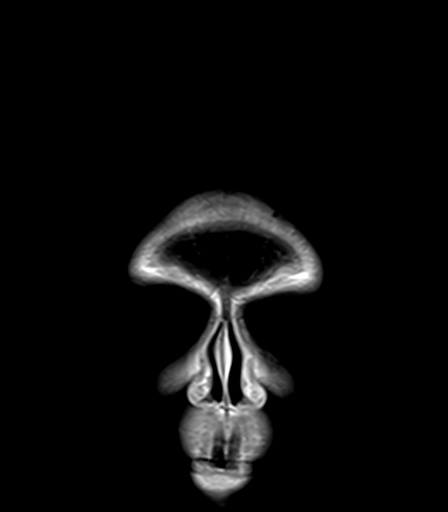
[im 29/29]
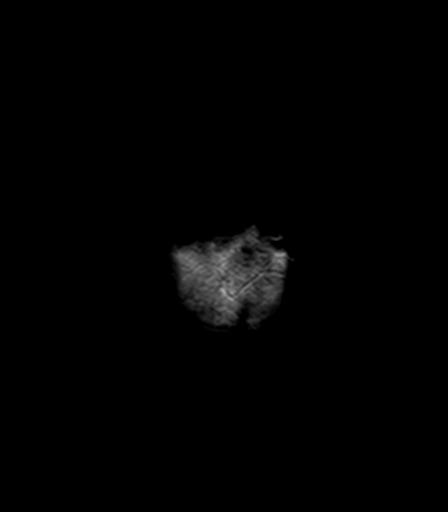

[Series 18: T1 post-contrast · sagittal · 5.0mm · 0.75mm/px · 1 of 25 slices shown (3 of 3)]
[im 1/25]
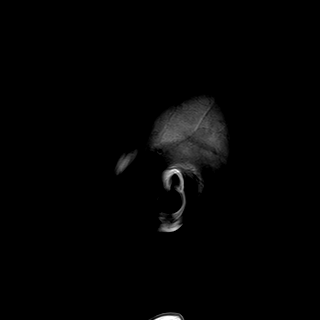

[48 of 48 positions shown; findings below may reference images not displayed]

FINDINGS: Brain: When compared to postcontrast images [DATE] there is fairly
dramatic progression of nodular and irregular enhancement in the
posterior left hemisphere about the atrium of the lateral ventricle,
tracking into the left occipital and posterior left temporal lobes
e.g. series 16, image 79 today versus series 16, image [REDACTED]). And mild mass effect on the adjacent ventricle has
developed since that time. Compared to [DATE], the enhancement
and mild regional mass effect have also progressed.

The underlying abnormal diffusion on DWI has not significantly
changed since [REDACTED]. Regional T2 and FLAIR hyperintensity has
progressed since [REDACTED] e.g. series 11, image 22 and series 11, image
36. Associated mild new mass effect in those areas.

No remote disease identified.

Elsewhere stable gray and white matter signal since [REDACTED], no
midline shift or significant intracranial mass effect. Basilar
cisterns remain normal. No restricted diffusion suggestive of acute
infarction. No acute ventriculomegaly, extra-axial collection or
acute intracranial hemorrhage. Cervicomedullary junction and
pituitary are within normal limits.

Vascular: Major intracranial vascular flow voids are stable. The
major dural venous sinuses are enhancing and appear patent.

Skull and upper cervical spine: Negative visible cervical spine and
spinal cord. Post craniotomy changes. No acute osseous abnormality
identified.

Sinuses/Orbits: Stable, negative.

Other: Trace right mastoid fluid unchanged. Stable scalp and face
soft tissues.
IMPRESSION: 1. Further tumor progression since [REDACTED], pronounced when compared
to the MRI in [DATE]. No new intracranial abnormality.

## 2021-06-21 MED ORDER — GADOBUTROL 1 MMOL/ML IV SOLN
5.0000 mL | Freq: Once | INTRAVENOUS | Status: AC | PRN
  Administered 2021-06-21: 5 mL via INTRAVENOUS

## 2021-06-23 ENCOUNTER — Inpatient Hospital Stay: Payer: BC Managed Care – PPO | Attending: Internal Medicine

## 2021-06-23 DIAGNOSIS — Z79899 Other long term (current) drug therapy: Secondary | ICD-10-CM | POA: Insufficient documentation

## 2021-06-23 DIAGNOSIS — C714 Malignant neoplasm of occipital lobe: Secondary | ICD-10-CM | POA: Insufficient documentation

## 2021-06-23 DIAGNOSIS — Z5112 Encounter for antineoplastic immunotherapy: Secondary | ICD-10-CM | POA: Insufficient documentation

## 2021-06-23 DIAGNOSIS — Z5111 Encounter for antineoplastic chemotherapy: Secondary | ICD-10-CM | POA: Insufficient documentation

## 2021-06-23 DIAGNOSIS — E039 Hypothyroidism, unspecified: Secondary | ICD-10-CM | POA: Insufficient documentation

## 2021-06-24 ENCOUNTER — Inpatient Hospital Stay: Payer: BC Managed Care – PPO | Admitting: Dietician

## 2021-06-24 ENCOUNTER — Inpatient Hospital Stay: Payer: BC Managed Care – PPO

## 2021-06-24 ENCOUNTER — Inpatient Hospital Stay (HOSPITAL_BASED_OUTPATIENT_CLINIC_OR_DEPARTMENT_OTHER): Payer: BC Managed Care – PPO | Admitting: Internal Medicine

## 2021-06-24 ENCOUNTER — Other Ambulatory Visit: Payer: Self-pay

## 2021-06-24 VITALS — BP 148/87 | HR 102 | Temp 97.7°F | Resp 18 | Wt 99.5 lb

## 2021-06-24 VITALS — BP 134/55 | HR 80

## 2021-06-24 DIAGNOSIS — E039 Hypothyroidism, unspecified: Secondary | ICD-10-CM | POA: Diagnosis not present

## 2021-06-24 DIAGNOSIS — C719 Malignant neoplasm of brain, unspecified: Secondary | ICD-10-CM

## 2021-06-24 DIAGNOSIS — C714 Malignant neoplasm of occipital lobe: Secondary | ICD-10-CM | POA: Diagnosis not present

## 2021-06-24 DIAGNOSIS — Z5112 Encounter for antineoplastic immunotherapy: Secondary | ICD-10-CM | POA: Diagnosis not present

## 2021-06-24 DIAGNOSIS — C713 Malignant neoplasm of parietal lobe: Secondary | ICD-10-CM

## 2021-06-24 DIAGNOSIS — Z5111 Encounter for antineoplastic chemotherapy: Secondary | ICD-10-CM | POA: Diagnosis not present

## 2021-06-24 DIAGNOSIS — Z79899 Other long term (current) drug therapy: Secondary | ICD-10-CM | POA: Diagnosis not present

## 2021-06-24 LAB — CBC WITH DIFFERENTIAL (CANCER CENTER ONLY)
Abs Immature Granulocytes: 0.02 10*3/uL (ref 0.00–0.07)
Basophils Absolute: 0 10*3/uL (ref 0.0–0.1)
Basophils Relative: 1 %
Eosinophils Absolute: 0.2 10*3/uL (ref 0.0–0.5)
Eosinophils Relative: 4 %
HCT: 26.7 % — ABNORMAL LOW (ref 36.0–46.0)
Hemoglobin: 8.9 g/dL — ABNORMAL LOW (ref 12.0–15.0)
Immature Granulocytes: 0 %
Lymphocytes Relative: 15 %
Lymphs Abs: 0.7 10*3/uL (ref 0.7–4.0)
MCH: 32 pg (ref 26.0–34.0)
MCHC: 33.3 g/dL (ref 30.0–36.0)
MCV: 96 fL (ref 80.0–100.0)
Monocytes Absolute: 0.6 10*3/uL (ref 0.1–1.0)
Monocytes Relative: 12 %
Neutro Abs: 3.3 10*3/uL (ref 1.7–7.7)
Neutrophils Relative %: 68 %
Platelet Count: 143 10*3/uL — ABNORMAL LOW (ref 150–400)
RBC: 2.78 MIL/uL — ABNORMAL LOW (ref 3.87–5.11)
RDW: 14.2 % (ref 11.5–15.5)
WBC Count: 4.9 10*3/uL (ref 4.0–10.5)
nRBC: 0 % (ref 0.0–0.2)

## 2021-06-24 LAB — CMP (CANCER CENTER ONLY)
ALT: 9 U/L (ref 0–44)
AST: 17 U/L (ref 15–41)
Albumin: 3.7 g/dL (ref 3.5–5.0)
Alkaline Phosphatase: 56 U/L (ref 38–126)
Anion gap: 7 (ref 5–15)
BUN: 39 mg/dL — ABNORMAL HIGH (ref 8–23)
CO2: 27 mmol/L (ref 22–32)
Calcium: 9.4 mg/dL (ref 8.9–10.3)
Chloride: 104 mmol/L (ref 98–111)
Creatinine: 1.34 mg/dL — ABNORMAL HIGH (ref 0.44–1.00)
GFR, Estimated: 44 mL/min — ABNORMAL LOW (ref >60)
Glucose, Bld: 98 mg/dL (ref 70–99)
Potassium: 3.5 mmol/L (ref 3.5–5.1)
Sodium: 138 mmol/L (ref 135–145)
Total Bilirubin: 0.4 mg/dL (ref 0.3–1.2)
Total Protein: 6.4 g/dL — ABNORMAL LOW (ref 6.5–8.1)

## 2021-06-24 LAB — TOTAL PROTEIN, URINE DIPSTICK: Protein, ur: 300 mg/dL — AB

## 2021-06-24 MED ORDER — PALONOSETRON HCL INJECTION 0.25 MG/5ML
0.2500 mg | Freq: Once | INTRAVENOUS | Status: AC
  Administered 2021-06-24: 0.25 mg via INTRAVENOUS
  Filled 2021-06-24: qty 5

## 2021-06-24 MED ORDER — SERTRALINE HCL 25 MG PO TABS: 25.0000 mg | ORAL_TABLET | Freq: Every day | ORAL | 1 refills | Status: DC

## 2021-06-24 MED ORDER — SODIUM CHLORIDE 0.9 % IV SOLN
125.0000 mg/m2 | Freq: Once | INTRAVENOUS | Status: AC
  Administered 2021-06-24: 180 mg via INTRAVENOUS
  Filled 2021-06-24: qty 9

## 2021-06-24 MED ORDER — ATROPINE SULFATE 1 MG/ML IV SOLN
1.0000 mg | Freq: Once | INTRAVENOUS | Status: AC | PRN
  Administered 2021-06-24: 1 mg via INTRAVENOUS
  Filled 2021-06-24: qty 1

## 2021-06-24 MED ORDER — SODIUM CHLORIDE 0.9 % IV SOLN
10.0000 mg/kg | Freq: Once | INTRAVENOUS | Status: AC
  Administered 2021-06-24: 450 mg via INTRAVENOUS
  Filled 2021-06-24: qty 16

## 2021-06-24 MED ORDER — SODIUM CHLORIDE 0.9 % IV SOLN: Freq: Once | INTRAVENOUS | Status: AC

## 2021-06-24 MED ORDER — SODIUM CHLORIDE 0.9 % IV SOLN
10.0000 mg | Freq: Once | INTRAVENOUS | Status: AC
  Administered 2021-06-24: 10 mg via INTRAVENOUS
  Filled 2021-06-24: qty 10

## 2021-06-24 NOTE — Progress Notes (Signed)
Per Dr Mickeal Skinner ok to proceed with Avastin and Iriniotecan today 06/24/2021 with current labs and vital signs.

## 2021-06-24 NOTE — Progress Notes (Signed)
[facility] [place], [place] [address] [phone]   Interval Evaluation  Date of Service: 06/24/21 Patient Name: Jillian Gomez Patient MRN: [id number] Patient DOB: 07-02-1955 Provider: Ventura Sellers, MD  Identifying Statement:  Jillian Gomez is a 66 y.o. female with left occipital glioblastoma   Oncologic History: Oncology History  Glioblastoma with isocitrate dehydrogenase gene wildtype ([place])  12/23/2018 Surgery   Craniotomy, left occipital resection by Dr. Kathyrn Sheriff.  Path is GBM IDH-wt   01/23/2019 - 03/03/2019 Radiation Therapy   IMRT with concurrent Temozolomide 15m/m2   03/28/2019 - 09/04/2019 Chemotherapy   5 cycles of adjuvant 5-day Temozolomide 2028mm2    09/01/2019 Progression   Progression of disease #1   09/19/2019 -  Chemotherapy   Begins second line therapy with oral CCNU 9041m2 q6 weeks, concurrent Avastin 61m63m IV q2 weeks   04/30/2020 -  Chemotherapy   Completes 4th cycle of CCNU/Avastin, transitions to Avastin monotherapy due to cytopenias   08/27/2020 -  Chemotherapy   Resumes CCNU+Avastin after periventricular DWI signal change noted on MRI    12/14/2020 Progression   Progression of disease #2   12/19/2020 - 04/01/2021 Chemotherapy   Initiates daily metronomic Temodar 50mg47mwith Avastin 61mg/76m2 weeks   03/30/2021 Progression   Progression of disease #3   04/08/2021 -  Chemotherapy   Patient is on Treatment Plan : BRAIN GBM [facility] Irinotecan + Bevacizumab D1,15 q 28d       Biomarkers:  MGMT Unknown.  IDH 1/2 Wild type.  EGFR Unknown  TERT Unknown   Interval History:  Jillian BLANCHIE ZELEZNIKnts today following recent MRI brain.  Today she describes ~1 week history of progressive difficulty with language, finding words.  She is struggling to get full sentences out for the most part, this has worsened even in the past several days.  No issues at all with weakness, numbness, no gait  impairment.  Remains otherwise functional and independent with activities.  No right sided weakness. No seizures or headaches.  H+P (01/09/19) Patient presented to medical attention in early December with ~2 months history of right sided visual impairment.  She describes fuzzy or blurry vision on that side which was progressive over time, leading to at least one fall.  MRI brain demonstrated enhancing left occipital mass; this was subsequently resected by Dr. NundkuKathyrn Sheriff/18/20.  She had no issues with surgery and has completed her steroid taper.  Continues on keppra daily but no history of any seizure. She has returned to work with only modest difficutly.  No issues walking or performing ADLs; lives alone but her sister and other family members are nearby.  Medications: Current Outpatient Medications on File Prior to Visit  Medication Sig Dispense Refill   amLODipine (NORVASC) 2.5 MG tablet Take 1 tablet (2.5 mg total) by mouth daily. 90 tablet 1   Biotin 1 MG CAPS Take 1 mg by mouth daily.     CALCIUM PO Take 1 tablet by mouth daily.     levothyroxine (SYNTHROID) 75 MCG tablet Take 75 mcg by mouth daily.     omeprazole (PRILOSEC) 20 MG capsule Take 1 capsule (20 mg total) by mouth daily. 30 capsule 3   ondansetron (ZOFRAN) 8 MG tablet Take 1 tablet (8 mg total) by mouth 2 (two) times daily as needed for nausea or vomiting. 20 tablet 0   rosuvastatin (CRESTOR) 10 MG tablet Take 5 mg by mouth at bedtime.  doxycycline (VIBRA-TABS) 100 MG tablet Take 1 tablet by mouth daily. (Patient not taking: Reported on 11/05/2020)     naproxen sodium (ALEVE) 220 MG tablet Take 1 tablet (220 mg total) by mouth daily as needed (pain). (Patient not taking: Reported on 03/18/2021)     No current facility-administered medications on file prior to visit.    Allergies:  Allergies  Allergen Reactions   Etodolac Other (See Comments)    GI upset   Macrobid [Nitrofurantoin] Rash   Past Medical History:  Past  Medical History:  Diagnosis Date   High cholesterol    Hypothyroidism    Joint pain    Thrombocytopenia ([place]) 02/26/2020   Past Surgical History:  Past Surgical History:  Procedure Laterality Date   APPLICATION OF CRANIAL NAVIGATION Left 12/23/2018   Procedure: APPLICATION OF CRANIAL NAVIGATION;  Surgeon: Consuella Lose, MD;  Location: [facility];  Service: Neurosurgery;  Laterality: Left;  APPLICATION OF CRANIAL NAVIGATION   BREAST BIOPSY Left    COLONOSCOPY     CRANIOTOMY Left 12/23/2018   Procedure: STEREOTACTIC LEFT OCCIPITAL CRANIOTOMY FOR RESECTION OF TUMOR;  Surgeon: Consuella Lose, MD;  Location: [facility];  Service: Neurosurgery;  Laterality: Left;  STEREOTACTIC LEFT OCCIPITAL CRANIOTOMY FOR RESECTION OF TUMOR   LASIK     Social History:  Social History   Socioeconomic History   Marital status: Single    Spouse name: Not on file   Number of children: Not on file   Years of education: Not on file   Highest education level: Not on file  Occupational History   Not on file  Tobacco Use   Smoking status: Never   Smokeless tobacco: Never  Vaping Use   Vaping Use: Never used  Substance and Sexual Activity   Alcohol use: Not Currently   Drug use: Never   Sexual activity: Not on file  Other Topics Concern   Not on file  Social History Narrative   Not on file   Social Determinants of Health   Financial Resource Strain: Not on file  Food Insecurity: Not on file  Transportation Needs: Not on file  Physical Activity: Not on file  Stress: Not on file  Social Connections: Not on file  Intimate Partner Violence: Not on file   Family History: No family history on file.  Review of Systems: Constitutional: Denies fevers, chills or abnormal weight loss Eyes: Denies blurriness of vision Gastrointestinal:  Denies nausea, constipation, diarrhea GU: Denies dysuria or incontinence Skin: Denies abnormal skin rashes Musculoskeletal: Denies joint pain, back or neck discomfort.   Behavioral/Psych: Denies anxiety, mood instability  Physical Exam: Vitals:   06/24/21 0915  BP: (!) 148/87  Pulse: (!) 102  Resp: 18  Temp: 97.7 F (36.5 C)  SpO2: 100%    KPS: 70. General: Alert, cooperative, pleasant, in no acute distress Head: Craniotomy scar noted, dry and intact. EENT: No conjunctival injection or scleral icterus. Oral mucosa moist Lungs: Resp effort normal Cardiac: Regular rate and rhythm Abdomen: Soft, non-distended abdomen Skin: No rashes cyanosis or petechiae. Extremities: Left shin skin level cut, bleeding  Neurologic Exam: Mental Status: Awake, alert, attentive to examiner. Oriented to self and environment. Language is notable for notable expressive dyshpasia. Cranial Nerves: Visual acuity is grossly normal. Right homonymous hemianopia. Extra-ocular movements intact. No ptosis. Face is symmetric, tongue midline. Motor: Tone and bulk are normal. Power is full in both arms and legs. Reflexes are symmetric, no pathologic reflexes present. Intact finger to nose bilaterally Sensory: Intact to  light touch and temperature Gait: Normal and tandem gait is deferred.   Labs: I have reviewed the data as listed    Component Value Date/Time   NA 138 06/24/2021 0857   K 3.5 06/24/2021 0857   CL 104 06/24/2021 0857   CO2 27 06/24/2021 0857   GLUCOSE 98 06/24/2021 0857   BUN 39 (H) 06/24/2021 0857   CREATININE 1.34 (H) 06/24/2021 0857   CALCIUM 9.4 06/24/2021 0857   PROT 6.4 (L) 06/24/2021 0857   ALBUMIN 3.7 06/24/2021 0857   AST 17 06/24/2021 0857   ALT 9 06/24/2021 0857   ALKPHOS 56 06/24/2021 0857   BILITOT 0.4 06/24/2021 0857   GFRNONAA 44 (L) 06/24/2021 0857   GFRAA >60 10/03/2019 1133   Lab Results  Component Value Date   WBC 4.9 06/24/2021   NEUTROABS 3.3 06/24/2021   HGB 8.9 (L) 06/24/2021   HCT 26.7 (L) 06/24/2021   MCV 96.0 06/24/2021   PLT 143 (L) 06/24/2021    Imaging:  [facility] Clinician Interpretation: I have personally reviewed  the CNS images as listed.  My interpretation, in the context of the patient's clinical presentation, is treatment effect vs true progression  MR BRAIN W WO CONTRAST  Result Date: 06/21/2021 CLINICAL DATA:  66 year old female Glioblastoma initially diagnosed 2020. IDH wild-type. Restaging. EXAM: MRI HEAD WITHOUT AND WITH CONTRAST TECHNIQUE: Multiplanar, multiecho pulse sequences of the brain and surrounding structures were obtained without and with intravenous contrast. CONTRAST:  22m GADAVIST GADOBUTROL 1 MMOL/ML IV SOLN COMPARISON:  Brain MRI 03/29/2021 and earlier. FINDINGS: Brain: When compared to postcontrast images 12/13/20 there is fairly dramatic progression of nodular and irregular enhancement in the posterior left hemisphere about the atrium of the lateral ventricle, tracking into the left occipital and posterior left temporal lobes e.g. series 16, image 79 today versus series 16, image 81 in December). And mild mass effect on the adjacent ventricle has developed since that time. Compared to 03/29/2021, the enhancement and mild regional mass effect have also progressed. The underlying abnormal diffusion on DWI has not significantly changed since M03-14-24 Regional T2 and FLAIR hyperintensity has progressed since M03/14/2024e.g. series 11, image 22 and series 11, image 36. Associated mild new mass effect in those areas. No remote disease identified. Elsewhere stable gray and white matter signal since M14-Mar-2024 no midline shift or significant intracranial mass effect. Basilar cisterns remain normal. No restricted diffusion suggestive of acute infarction. No acute ventriculomegaly, extra-axial collection or acute intracranial hemorrhage. Cervicomedullary junction and pituitary are within normal limits. Vascular: Major intracranial vascular flow voids are stable. The major dural venous sinuses are enhancing and appear patent. Skull and upper cervical spine: Negative visible cervical spine and spinal cord. Post  craniotomy changes. No acute osseous abnormality identified. Sinuses/Orbits: Stable, negative. Other: Trace right mastoid fluid unchanged. Stable scalp and face soft tissues. IMPRESSION: 1. Further tumor progression since MMar 14, 2024 pronounced when compared to the MRI in December 2022. 2. No new intracranial abnormality. Electronically Signed   By: HGenevie AnnM.D.   On: 06/21/2021 13:09     Assessment/Plan Glioblastoma, IDH-1 WT  RTAVI GAUGHRANis clinically progressive today, with notable decline in expressive language.  Motor function fortunately remains stable.    MRI was reviewed in detail.  Although it demonstrates clear progression of enhancing volume, it is unclear how much of this is related to avastin wash-out.  Last treatment was 8 weeks ago due to proteinuria.  If we compare study to last image prior to avastin,  September 2021, similar tumor burden is visualized.  Overall, it is difficult to assess failure of CPT-11/avastin protocol at this time.    We discussed remaining on treatment for 4 weeks, including resuming avastin today because of improved proteinuria.  We could then repeat an MRI in 1 month; if further progression, we will abort this therapy.  If stable or improved we will consider continuing, using steroids in place of avastin if needed due to renal issues.   She is therefore clear to proceed with avastin and irinotecan today.  Chemotherapy should be held for the following:  ANC less than 1,000  Platelets less than 100,000  LFT or creatinine greater than 2x ULN  If clinical concerns/contraindications develop  Avastin should be held for the following:   ANC less than 500  Platelets less than 50,000  LFT or creatinine greater than 2x ULN  If clinical concerns/contraindications develop  May con't amlodipine 2.37m daily in the interim.  We ask that RWANETA FITTINGreturn to clinic in 2 weeks for next cycle of cpt-11/avastin.  MRI will be scheduled for 4 weeks.  All  questions were answered. The patient knows to call the clinic with any problems, questions or concerns. No barriers to learning were detected.  I have spent a total of 40 minutes of face-to-face and non-face-to-face time, excluding clinical staff time, preparing to see patient, ordering tests and/or medications, counseling the patient, and independently interpreting results and communicating results to the patient/family/caregiver    ZVentura Sellers MD Medical Director of Neuro-Oncology [facility] [place]06/20/23 10:06 AM

## 2021-06-24 NOTE — Patient Instructions (Signed)
[facility] MEDICAL ONCOLOGY  Discharge Instructions: Thank you for choosing [facility] to provide your oncology and hematology care.   If you have a lab appointment with the Cancer Center, please go directly to the Cancer Center and check in at the registration area.   Wear comfortable clothing and clothing appropriate for easy access to any Portacath or PICC line.   We strive to give you quality time with your provider. You may need to reschedule your appointment if you arrive late (15 or more minutes).  Arriving late affects you and other patients whose appointments are after yours.  Also, if you miss three or more appointments without notifying the office, you may be dismissed from the clinic at the provider's discretion.      For prescription refill requests, have your pharmacy contact our office and allow 72 hours for refills to be completed.    Today you received the following chemotherapy and/or immunotherapy agents Zirabev & Irinotecan      To help prevent nausea and vomiting after your treatment, we encourage you to take your nausea medication as directed.  BELOW ARE SYMPTOMS THAT SHOULD BE REPORTED IMMEDIATELY: *FEVER GREATER THAN 100.4 F (38 C) OR HIGHER *CHILLS OR SWEATING *NAUSEA AND VOMITING THAT IS NOT CONTROLLED WITH YOUR NAUSEA MEDICATION *UNUSUAL SHORTNESS OF BREATH *UNUSUAL BRUISING OR BLEEDING *URINARY PROBLEMS (pain or burning when urinating, or frequent urination) *BOWEL PROBLEMS (unusual diarrhea, constipation, pain near the anus) TENDERNESS IN MOUTH AND THROAT WITH OR WITHOUT PRESENCE OF ULCERS (sore throat, sores in mouth, or a toothache) UNUSUAL RASH, SWELLING OR PAIN  UNUSUAL VAGINAL DISCHARGE OR ITCHING   Items with * indicate a potential emergency and should be followed up as soon as possible or go to the Emergency Department if any problems should occur.  Please show the CHEMOTHERAPY ALERT CARD or IMMUNOTHERAPY ALERT CARD at  check-in to the Emergency Department and triage nurse.  Should you have questions after your visit or need to cancel or reschedule your appointment, please contact [facility]ICAL [facility]COL[facility]  Dept: [phone]  and follow the prompts.  Office hours are 8:00 a.m. to 4:30 p.m. Monday - Friday. Please note that voicemails left after 4:00 p.m. may not be returned until the following business day.  We are closed weekends and major holidays. You have access to a nurse at all times for urgent questions. Please call the main number to the clinic Dept: [phone] and follow the prompts.   For any non-urgent questions, you may also contact your provider using MyChart. We now offer e-Visits for anyone 18 and older to request care online for non-urgent symptoms. For details visit [email or web address].   Also download the MyChart app! Go to the app store, search "MyChart", open the app, select [facility], and log in with your MyChart username and password.  Masks are optional in the cancer centers. If you would like for your care team to wear a mask while they are taking care of you, please let them know. For doctor visits, patients may have with them one support person who is at least 65 years old. At this time, visitors are not allowed in the infusion area. 

## 2021-06-24 NOTE — Progress Notes (Signed)
Nutrition Follow-up:  Patient with left occipital glioblastoma. S/p left craniotomy (2020). She is currently receiving Irinotecan + Bevacizumab q14d  Met with patient in infusion. She reports appetite is the same. Patient is forcing herself to eat small amounts of food 1-2 times daily (individual mac/cheese, yogurt, cottage cheese) Patient recalls eating a [place] sandwich prior to visit. She is drinking 2 Ensure Complete. Patient denies nausea, vomiting, diarrhea, constipation.    Medications: reviewed   Labs: BUN 39, Cr 1.34  Anthropometrics: Weight 99 lb 8 oz today stable  5/30 - 101 lb 12.8 oz 5/16 - 98 lb 6.4 oz 4/25 - 97 lb 6.4 pz   NUTRITION DIAGNOSIS: Severe malnutrition continues, but stable   INTERVENTION:  Patient will work to increase intake of Ensure Complete/equivalent to 3/day - coupons provided Encouraged high calorie high protein foods    MONITORING, EVALUATION, GOAL: weight trends, intake   NEXT VISIT: To be scheduled

## 2021-06-25 ENCOUNTER — Telehealth: Payer: Self-pay | Admitting: Internal Medicine

## 2021-06-25 NOTE — Telephone Encounter (Signed)
Per 6/20 los called and left message about appointments  details and call back number were left

## 2021-06-27 ENCOUNTER — Encounter: Payer: Self-pay | Admitting: Internal Medicine

## 2021-07-01 ENCOUNTER — Telehealth: Payer: Self-pay | Admitting: *Deleted

## 2021-07-01 ENCOUNTER — Other Ambulatory Visit: Payer: Self-pay | Admitting: *Deleted

## 2021-07-01 DIAGNOSIS — C719 Malignant neoplasm of brain, unspecified: Secondary | ICD-10-CM | POA: Diagnosis not present

## 2021-07-01 DIAGNOSIS — I619 Nontraumatic intracerebral hemorrhage, unspecified: Secondary | ICD-10-CM | POA: Diagnosis not present

## 2021-07-01 DIAGNOSIS — R220 Localized swelling, mass and lump, head: Secondary | ICD-10-CM | POA: Diagnosis not present

## 2021-07-01 DIAGNOSIS — R4701 Aphasia: Secondary | ICD-10-CM | POA: Diagnosis not present

## 2021-07-01 DIAGNOSIS — D496 Neoplasm of unspecified behavior of brain: Secondary | ICD-10-CM | POA: Diagnosis not present

## 2021-07-01 DIAGNOSIS — Z20822 Contact with and (suspected) exposure to covid-19: Secondary | ICD-10-CM | POA: Diagnosis not present

## 2021-07-01 DIAGNOSIS — G9389 Other specified disorders of brain: Secondary | ICD-10-CM | POA: Diagnosis not present

## 2021-07-01 DIAGNOSIS — R600 Localized edema: Secondary | ICD-10-CM | POA: Diagnosis not present

## 2021-07-01 MED ORDER — DEXAMETHASONE 4 MG PO TABS: 4.0000 mg | ORAL_TABLET | Freq: Every day | ORAL | 1 refills | Status: DC

## 2021-07-01 NOTE — Telephone Encounter (Signed)
Patient's sister called and reported that patient had some slight improvement last week for a couple of days (Thursday and Friday) but since then has gotten much worse.  She wanted to know if this would validate putting her back on Decadron.    Pharmacy on file.  Routed to Dr Barbaraann Cao to review.

## 2021-07-02 ENCOUNTER — Telehealth: Payer: Self-pay

## 2021-07-02 DIAGNOSIS — I619 Nontraumatic intracerebral hemorrhage, unspecified: Secondary | ICD-10-CM | POA: Diagnosis not present

## 2021-07-02 DIAGNOSIS — D496 Neoplasm of unspecified behavior of brain: Secondary | ICD-10-CM | POA: Diagnosis not present

## 2021-07-02 DIAGNOSIS — R220 Localized swelling, mass and lump, head: Secondary | ICD-10-CM | POA: Diagnosis not present

## 2021-07-02 DIAGNOSIS — G9389 Other specified disorders of brain: Secondary | ICD-10-CM | POA: Diagnosis not present

## 2021-07-02 NOTE — Telephone Encounter (Signed)
T/C from pt's sister, Jillian Gomez, to [facility] 07/01/21 at 4:49 PM stating pt had a sudden inability to speak or speech was slurred.  She is also having difficulty walking and has a temp of 100.4.  She was wanting to know whether they should go to the ER.  [name] was advised to call EMS  Spoke to pt's sister, Jillian Gomez, and pt went to [facility] and was admitted for overnight observation.  She had a CT last night and an MRI this morning.  Pt seems better this am and they are waiting on MRI results and possible discharge.

## 2021-07-03 ENCOUNTER — Telehealth: Payer: Self-pay | Admitting: *Deleted

## 2021-07-03 NOTE — Telephone Encounter (Signed)
Sister called to report hospital visit with [facility].  They placed her on   Decadron 4 mg BID X 4 days Decadron 3 mg BID X 4 days Decadron 2 mg BID X 4 days Decadron 1 mg BID X 4 days  Dr Mickeal Skinner will reevaluate Decadron dosing when they come in next week and that dosing is ok.    She also had the start of shingles under breast.  They ordered Valtrex.  No blisters. Just active pain.

## 2021-07-08 ENCOUNTER — Encounter: Payer: Self-pay | Admitting: Internal Medicine

## 2021-07-10 ENCOUNTER — Other Ambulatory Visit: Payer: Self-pay

## 2021-07-10 ENCOUNTER — Inpatient Hospital Stay (HOSPITAL_BASED_OUTPATIENT_CLINIC_OR_DEPARTMENT_OTHER): Payer: Medicare Other | Admitting: Internal Medicine

## 2021-07-10 ENCOUNTER — Inpatient Hospital Stay: Payer: Medicare Other

## 2021-07-10 ENCOUNTER — Inpatient Hospital Stay: Payer: Medicare Other | Attending: Internal Medicine

## 2021-07-10 ENCOUNTER — Other Ambulatory Visit: Payer: Self-pay | Admitting: Radiation Therapy

## 2021-07-10 ENCOUNTER — Encounter: Payer: Self-pay | Admitting: Internal Medicine

## 2021-07-10 ENCOUNTER — Ambulatory Visit
Admission: RE | Admit: 2021-07-10 | Discharge: 2021-07-10 | Disposition: A | Discharge status: Home / Self Care | Payer: Self-pay | Source: Ambulatory Visit | Attending: Internal Medicine | Admitting: Internal Medicine

## 2021-07-10 VITALS — BP 198/88 | HR 85 | Temp 97.5°F | Resp 16 | Ht 62.0 in | Wt 103.3 lb

## 2021-07-10 DIAGNOSIS — Z7189 Other specified counseling: Secondary | ICD-10-CM

## 2021-07-10 DIAGNOSIS — C719 Malignant neoplasm of brain, unspecified: Secondary | ICD-10-CM

## 2021-07-10 DIAGNOSIS — R809 Proteinuria, unspecified: Secondary | ICD-10-CM | POA: Diagnosis not present

## 2021-07-10 DIAGNOSIS — Z5111 Encounter for antineoplastic chemotherapy: Secondary | ICD-10-CM | POA: Insufficient documentation

## 2021-07-10 DIAGNOSIS — Z5112 Encounter for antineoplastic immunotherapy: Secondary | ICD-10-CM | POA: Diagnosis not present

## 2021-07-10 DIAGNOSIS — Z79899 Other long term (current) drug therapy: Secondary | ICD-10-CM | POA: Diagnosis not present

## 2021-07-10 DIAGNOSIS — Z5189 Encounter for other specified aftercare: Secondary | ICD-10-CM | POA: Diagnosis not present

## 2021-07-10 DIAGNOSIS — Z7952 Long term (current) use of systemic steroids: Secondary | ICD-10-CM | POA: Diagnosis not present

## 2021-07-10 DIAGNOSIS — Z923 Personal history of irradiation: Secondary | ICD-10-CM | POA: Diagnosis not present

## 2021-07-10 DIAGNOSIS — C714 Malignant neoplasm of occipital lobe: Secondary | ICD-10-CM | POA: Insufficient documentation

## 2021-07-10 DIAGNOSIS — D696 Thrombocytopenia, unspecified: Secondary | ICD-10-CM | POA: Insufficient documentation

## 2021-07-10 LAB — CMP (CANCER CENTER ONLY)
ALT: 34 U/L (ref 0–44)
AST: 29 U/L (ref 15–41)
Albumin: 3.4 g/dL — ABNORMAL LOW (ref 3.5–5.0)
Alkaline Phosphatase: 73 U/L (ref 38–126)
Anion gap: 7 (ref 5–15)
BUN: 56 mg/dL — ABNORMAL HIGH (ref 8–23)
CO2: 22 mmol/L (ref 22–32)
Calcium: 8.5 mg/dL — ABNORMAL LOW (ref 8.9–10.3)
Chloride: 107 mmol/L (ref 98–111)
Creatinine: 1.3 mg/dL — ABNORMAL HIGH (ref 0.44–1.00)
GFR, Estimated: 46 mL/min — ABNORMAL LOW (ref >60)
Glucose, Bld: 181 mg/dL — ABNORMAL HIGH (ref 70–99)
Potassium: 4.1 mmol/L (ref 3.5–5.1)
Sodium: 136 mmol/L (ref 135–145)
Total Bilirubin: 0.7 mg/dL (ref 0.3–1.2)
Total Protein: 5.3 g/dL — ABNORMAL LOW (ref 6.5–8.1)

## 2021-07-10 LAB — CBC WITH DIFFERENTIAL (CANCER CENTER ONLY)
Abs Immature Granulocytes: 0.37 10*3/uL — ABNORMAL HIGH (ref 0.00–0.07)
Basophils Absolute: 0 10*3/uL (ref 0.0–0.1)
Basophils Relative: 0 %
Eosinophils Absolute: 0 10*3/uL (ref 0.0–0.5)
Eosinophils Relative: 0 %
HCT: 29.1 % — ABNORMAL LOW (ref 36.0–46.0)
Hemoglobin: 10.3 g/dL — ABNORMAL LOW (ref 12.0–15.0)
Immature Granulocytes: 4 %
Lymphocytes Relative: 8 %
Lymphs Abs: 0.7 10*3/uL (ref 0.7–4.0)
MCH: 32.6 pg (ref 26.0–34.0)
MCHC: 35.4 g/dL (ref 30.0–36.0)
MCV: 92.1 fL (ref 80.0–100.0)
Monocytes Absolute: 0.6 10*3/uL (ref 0.1–1.0)
Monocytes Relative: 7 %
Neutro Abs: 7.1 10*3/uL (ref 1.7–7.7)
Neutrophils Relative %: 81 %
Platelet Count: 106 10*3/uL — ABNORMAL LOW (ref 150–400)
RBC: 3.16 MIL/uL — ABNORMAL LOW (ref 3.87–5.11)
RDW: 14 % (ref 11.5–15.5)
WBC Count: 8.7 10*3/uL (ref 4.0–10.5)
nRBC: 0 % (ref 0.0–0.2)

## 2021-07-10 LAB — TOTAL PROTEIN, URINE DIPSTICK: Protein, ur: >2000 mg/dL — AB

## 2021-07-10 MED ORDER — DEXAMETHASONE 4 MG PO TABS: 2.0000 mg | ORAL_TABLET | Freq: Every day | ORAL | 1 refills | Status: DC

## 2021-07-10 MED ORDER — AMLODIPINE BESYLATE 5 MG PO TABS: 5.0000 mg | ORAL_TABLET | Freq: Every day | ORAL | 1 refills | Status: DC

## 2021-07-10 NOTE — Progress Notes (Signed)
[facility] at [place], [place] [address] [phone]   Interval Evaluation  Date of Service: 07/10/21 Patient Name: Jillian Gomez Patient MRN: [id number] Patient DOB: October 04, 1955 Provider: Ventura Sellers, MD  Identifying Statement:  Jillian Gomez is a 66 y.o. female with left occipital glioblastoma   Oncologic History: Oncology History  Glioblastoma with isocitrate dehydrogenase gene wildtype ([place])  12/23/2018 Surgery   Craniotomy, left occipital resection by Dr. Kathyrn Sheriff.  Path is GBM IDH-wt   01/23/2019 - 03/03/2019 Radiation Therapy   IMRT with concurrent Temozolomide 69m/m2   03/28/2019 - 09/04/2019 Chemotherapy   5 cycles of adjuvant 5-day Temozolomide 2033mm2    09/01/2019 Progression   Progression of disease #1   09/19/2019 -  Chemotherapy   Begins second line therapy with oral CCNU 902m2 q6 weeks, concurrent Avastin 61m74m IV q2 weeks   04/30/2020 -  Chemotherapy   Completes 4th cycle of CCNU/Avastin, transitions to Avastin monotherapy due to cytopenias   08/27/2020 -  Chemotherapy   Resumes CCNU+Avastin after periventricular DWI signal change noted on MRI    12/14/2020 Progression   Progression of disease #2   12/19/2020 - 04/01/2021 Chemotherapy   Initiates daily metronomic Temodar 50mg76mwith Avastin 61mg/9m2 weeks   03/30/2021 Progression   Progression of disease #3   04/08/2021 -  Chemotherapy   Patient is on Treatment Plan : BRAIN GBM [facility] Irinotecan + Bevacizumab D1,15 q 28d       Biomarkers:  MGMT Unknown.  IDH 1/2 Wild type.  EGFR Unknown  TERT Unknown   Interval History:  Jillian DAHLILA PFAHLERnts today following recent MRI brain.  She feels improved since discharge from [facility].  She had presented with confusion, lethargy, fever and found to have shingles rash.  She is more than halfway through 2 week course of valtrex.  Decadron is dosed now at 3mg da8m, down from 4mg.  S49m continues with struggling to get full sentences out for the most part, similar to prior visit.  No issues at all with weakness, numbness, no gait impairment since discharge.  No right sided weakness. No seizures or headaches.  H+P (01/09/19) Patient presented to medical attention in early December with ~2 months history of right sided visual impairment.  She describes fuzzy or blurry vision on that side which was progressive over time, leading to at least one fall.  MRI brain demonstrated enhancing left occipital mass; this was subsequently resected by Dr. NundkumaKathyrn Sheriff8/20.  She had no issues with surgery and has completed her steroid taper.  Continues on keppra daily but no history of any seizure. She has returned to work with only modest difficutly.  No issues walking or performing ADLs; lives alone but her sister and other family members are nearby.  Medications: Current Outpatient Medications on File Prior to Visit  Medication Sig Dispense Refill   Biotin 1 MG CAPS Take 1 mg by mouth daily.     CALCIUM PO Take 1 tablet by mouth daily.     levothyroxine (SYNTHROID) 75 MCG tablet Take 75 mcg by mouth daily.     omeprazole (PRILOSEC) 20 MG capsule Take 1 capsule (20 mg total) by mouth daily. 30 capsule 3   ondansetron (ZOFRAN) 8 MG tablet Take 1 tablet (8 mg total) by mouth 2 (two) times daily as needed for nausea or vomiting. 20 tablet 0   rosuvastatin (CRESTOR) 10 MG tablet Take 5 mg by  mouth at bedtime.     sertraline (ZOLOFT) 25 MG tablet Take 1 tablet (25 mg total) by mouth daily. 60 tablet 1   valACYclovir (VALTREX) 500 MG tablet Take 1,000 mg by mouth 3 (three) times daily.     doxycycline (VIBRA-TABS) 100 MG tablet Take 1 tablet by mouth daily. (Patient not taking: Reported on 11/05/2020)     naproxen sodium (ALEVE) 220 MG tablet Take 1 tablet (220 mg total) by mouth daily as needed (pain). (Patient not taking: Reported on 03/18/2021)     No current facility-administered medications on  file prior to visit.    Allergies:  Allergies  Allergen Reactions   Etodolac Other (See Comments)    GI upset   Macrobid [Nitrofurantoin] Rash   Past Medical History:  Past Medical History:  Diagnosis Date   High cholesterol    Hypothyroidism    Joint pain    Thrombocytopenia ([place]) 02/26/2020   Past Surgical History:  Past Surgical History:  Procedure Laterality Date   APPLICATION OF CRANIAL NAVIGATION Left 12/23/2018   Procedure: APPLICATION OF CRANIAL NAVIGATION;  Surgeon: Consuella Lose, MD;  Location: [facility];  Service: Neurosurgery;  Laterality: Left;  APPLICATION OF CRANIAL NAVIGATION   BREAST BIOPSY Left    COLONOSCOPY     CRANIOTOMY Left 12/23/2018   Procedure: STEREOTACTIC LEFT OCCIPITAL CRANIOTOMY FOR RESECTION OF TUMOR;  Surgeon: Consuella Lose, MD;  Location: [facility];  Service: Neurosurgery;  Laterality: Left;  STEREOTACTIC LEFT OCCIPITAL CRANIOTOMY FOR RESECTION OF TUMOR   LASIK     Social History:  Social History   Socioeconomic History   Marital status: Single    Spouse name: Not on file   Number of children: Not on file   Years of education: Not on file   Highest education level: Not on file  Occupational History   Not on file  Tobacco Use   Smoking status: Never   Smokeless tobacco: Never  Vaping Use   Vaping Use: Never used  Substance and Sexual Activity   Alcohol use: Not Currently   Drug use: Never   Sexual activity: Not on file  Other Topics Concern   Not on file  Social History Narrative   Not on file   Social Determinants of Health   Financial Resource Strain: Not on file  Food Insecurity: Not on file  Transportation Needs: Not on file  Physical Activity: Not on file  Stress: Not on file  Social Connections: Not on file  Intimate Partner Violence: Not on file   Family History: No family history on file.  Review of Systems: Constitutional: Denies fevers, chills or abnormal weight loss Eyes: Denies blurriness of  vision Gastrointestinal:  Denies nausea, constipation, diarrhea GU: Denies dysuria or incontinence Skin: Denies abnormal skin rashes Musculoskeletal: Denies joint pain, back or neck discomfort.  Behavioral/Psych: Denies anxiety, mood instability  Physical Exam: Vitals:   07/10/21 1022  BP: (!) 198/88  Pulse: 85  Resp: 16  Temp: (!) 97.5 F (36.4 C)  SpO2: 100%    KPS: 70. General: Alert, cooperative, pleasant, in no acute distress Head: Craniotomy scar noted, dry and intact. EENT: No conjunctival injection or scleral icterus. Oral mucosa moist Lungs: Resp effort normal Cardiac: Regular rate and rhythm Abdomen: Soft, non-distended abdomen Skin: No rashes cyanosis or petechiae. Extremities: Left shin skin level cut, bleeding  Neurologic Exam: Mental Status: Awake, alert, attentive to examiner. Oriented to self and environment. Language is notable for notable expressive dyshpasia. Cranial Nerves: Visual acuity is  grossly normal. Right homonymous hemianopia. Extra-ocular movements intact. No ptosis. Face is symmetric, tongue midline. Motor: Tone and bulk are normal. Power is full in both arms and legs. Reflexes are symmetric, no pathologic reflexes present. Intact finger to nose bilaterally Sensory: Intact to light touch and temperature Gait: Normal and tandem gait is deferred.   Labs: I have reviewed the data as listed    Component Value Date/Time   NA 136 07/10/2021 0949   K 4.1 07/10/2021 0949   CL 107 07/10/2021 0949   CO2 22 07/10/2021 0949   GLUCOSE 181 (H) 07/10/2021 0949   BUN 56 (H) 07/10/2021 0949   CREATININE 1.30 (H) 07/10/2021 0949   CALCIUM 8.5 (L) 07/10/2021 0949   PROT 5.3 (L) 07/10/2021 0949   ALBUMIN 3.4 (L) 07/10/2021 0949   AST 29 07/10/2021 0949   ALT 34 07/10/2021 0949   ALKPHOS 73 07/10/2021 0949   BILITOT 0.7 07/10/2021 0949   GFRNONAA 46 (L) 07/10/2021 0949   GFRAA >60 10/03/2019 1133   Lab Results  Component Value Date   WBC 8.7  07/10/2021   NEUTROABS 7.1 07/10/2021   HGB 10.3 (L) 07/10/2021   HCT 29.1 (L) 07/10/2021   MCV 92.1 07/10/2021   PLT 106 (L) 07/10/2021      Assessment/Plan Glioblastoma, IDH-1 WT  Jillian Gomez is clinically stable today, after recent admission for neurocognitive and gait decline 2/2 zoster infection and accompanying fever.  Labs notable today for proteinuria and thrombocytopenia.  We will defer chemo and avastin today to giver her additional recovery time.    Brain MRI should be pushed back to 7/28 or 7/29 if able.  Chemotherapy should be held for the following:  ANC less than 1,000  Platelets less than 100,000  LFT or creatinine greater than 2x ULN  If clinical concerns/contraindications develop  Avastin should be held for the following:   ANC less than 500  Platelets less than 50,000  LFT or creatinine greater than 2x ULN  If clinical concerns/contraindications develop  Will complete 2 week course of valacyclovir.  Will increase norvasc to 54m daily.  Decadron may decrease to 284mdaily.  We ask that Jillian MARKWARDTeturn to clinic ~10 days for next cycle of cpt-11/avastin.  MRI will request for 08/01/21.  All questions were answered. The patient knows to call the clinic with any problems, questions or concerns. No barriers to learning were detected.  I have spent a total of 30 minutes of face-to-face and non-face-to-face time, excluding clinical staff time, preparing to see patient, ordering tests and/or medications, counseling the patient, and independently interpreting results and communicating results to the patient/family/caregiver    ZaVentura SellersMD Medical Director of Neuro-Oncology [facility]7/06/23 12:08 PM

## 2021-07-11 ENCOUNTER — Telehealth: Payer: Self-pay | Admitting: Internal Medicine

## 2021-07-11 NOTE — Telephone Encounter (Signed)
Per 7/6 los called and left message for pt about appointment.  Details and call back number was left

## 2021-07-22 ENCOUNTER — Other Ambulatory Visit: Payer: Self-pay

## 2021-07-22 ENCOUNTER — Inpatient Hospital Stay (HOSPITAL_BASED_OUTPATIENT_CLINIC_OR_DEPARTMENT_OTHER): Payer: Medicare Other | Admitting: Internal Medicine

## 2021-07-22 ENCOUNTER — Inpatient Hospital Stay: Payer: Medicare Other

## 2021-07-22 ENCOUNTER — Encounter: Payer: Self-pay | Admitting: Internal Medicine

## 2021-07-22 ENCOUNTER — Encounter: Payer: Self-pay | Admitting: Dietician

## 2021-07-22 VITALS — BP 157/93 | HR 75 | Temp 97.3°F | Resp 16 | Wt 97.2 lb

## 2021-07-22 DIAGNOSIS — Z7189 Other specified counseling: Secondary | ICD-10-CM | POA: Diagnosis not present

## 2021-07-22 DIAGNOSIS — C719 Malignant neoplasm of brain, unspecified: Secondary | ICD-10-CM | POA: Diagnosis not present

## 2021-07-22 DIAGNOSIS — Z5112 Encounter for antineoplastic immunotherapy: Secondary | ICD-10-CM | POA: Diagnosis not present

## 2021-07-22 LAB — CBC WITH DIFFERENTIAL (CANCER CENTER ONLY)
Abs Immature Granulocytes: 0.04 10*3/uL (ref 0.00–0.07)
Basophils Absolute: 0 10*3/uL (ref 0.0–0.1)
Basophils Relative: 0 %
Eosinophils Absolute: 0 10*3/uL (ref 0.0–0.5)
Eosinophils Relative: 0 %
HCT: 29.3 % — ABNORMAL LOW (ref 36.0–46.0)
Hemoglobin: 10.2 g/dL — ABNORMAL LOW (ref 12.0–15.0)
Immature Granulocytes: 1 %
Lymphocytes Relative: 12 %
Lymphs Abs: 1 10*3/uL (ref 0.7–4.0)
MCH: 33.1 pg (ref 26.0–34.0)
MCHC: 34.8 g/dL (ref 30.0–36.0)
MCV: 95.1 fL (ref 80.0–100.0)
Monocytes Absolute: 0.7 10*3/uL (ref 0.1–1.0)
Monocytes Relative: 8 %
Neutro Abs: 6.7 10*3/uL (ref 1.7–7.7)
Neutrophils Relative %: 79 %
Platelet Count: 91 10*3/uL — ABNORMAL LOW (ref 150–400)
RBC: 3.08 MIL/uL — ABNORMAL LOW (ref 3.87–5.11)
RDW: 15.6 % — ABNORMAL HIGH (ref 11.5–15.5)
WBC Count: 8.5 10*3/uL (ref 4.0–10.5)
nRBC: 0 % (ref 0.0–0.2)

## 2021-07-22 LAB — CMP (CANCER CENTER ONLY)
ALT: 24 U/L (ref 0–44)
AST: 23 U/L (ref 15–41)
Albumin: 3.4 g/dL — ABNORMAL LOW (ref 3.5–5.0)
Alkaline Phosphatase: 48 U/L (ref 38–126)
Anion gap: 5 (ref 5–15)
BUN: 51 mg/dL — ABNORMAL HIGH (ref 8–23)
CO2: 25 mmol/L (ref 22–32)
Calcium: 8.3 mg/dL — ABNORMAL LOW (ref 8.9–10.3)
Chloride: 105 mmol/L (ref 98–111)
Creatinine: 1.15 mg/dL — ABNORMAL HIGH (ref 0.44–1.00)
GFR, Estimated: 53 mL/min — ABNORMAL LOW (ref >60)
Glucose, Bld: 108 mg/dL — ABNORMAL HIGH (ref 70–99)
Potassium: 4.1 mmol/L (ref 3.5–5.1)
Sodium: 135 mmol/L (ref 135–145)
Total Bilirubin: 0.9 mg/dL (ref 0.3–1.2)
Total Protein: 5.2 g/dL — ABNORMAL LOW (ref 6.5–8.1)

## 2021-07-22 LAB — TOTAL PROTEIN, URINE DIPSTICK: Protein, ur: 300 mg/dL — AB

## 2021-07-22 MED ORDER — SODIUM CHLORIDE 0.9 % IV SOLN: Freq: Once | INTRAVENOUS | Status: AC

## 2021-07-22 MED ORDER — PALONOSETRON HCL INJECTION 0.25 MG/5ML
0.2500 mg | Freq: Once | INTRAVENOUS | Status: AC
  Administered 2021-07-22: 0.25 mg via INTRAVENOUS
  Filled 2021-07-22: qty 5

## 2021-07-22 MED ORDER — ATROPINE SULFATE 1 MG/ML IV SOLN
1.0000 mg | Freq: Once | INTRAVENOUS | Status: AC | PRN
  Administered 2021-07-22: 1 mg via INTRAVENOUS
  Filled 2021-07-22: qty 1

## 2021-07-22 MED ORDER — SODIUM CHLORIDE 0.9 % IV SOLN
10.0000 mg | Freq: Once | INTRAVENOUS | Status: AC
  Administered 2021-07-22: 10 mg via INTRAVENOUS
  Filled 2021-07-22: qty 10

## 2021-07-22 MED ORDER — IRINOTECAN HCL CHEMO INJECTION 100 MG/5ML
125.0000 mg/m2 | Freq: Once | INTRAVENOUS | Status: AC
  Administered 2021-07-22: 180 mg via INTRAVENOUS
  Filled 2021-07-22: qty 9

## 2021-07-22 MED ORDER — SODIUM CHLORIDE 0.9 % IV SOLN
10.0000 mg/kg | Freq: Once | INTRAVENOUS | Status: AC
  Administered 2021-07-22: 450 mg via INTRAVENOUS
  Filled 2021-07-22: qty 16

## 2021-07-22 NOTE — Progress Notes (Signed)
[facility] at [place], [place] [address] [phone]   Interval Evaluation  Date of Service: 07/22/21 Patient Name: Jillian Gomez Patient MRN: [id number] Patient DOB: 04/09/1955 Provider: Ventura Sellers, MD  Identifying Statement:  Jillian Gomez is a 66 y.o. female with left occipital glioblastoma   Oncologic History: Oncology History  Glioblastoma with isocitrate dehydrogenase gene wildtype ([place])  12/23/2018 Surgery   Craniotomy, left occipital resection by Dr. Kathyrn Sheriff.  Path is GBM IDH-wt   01/23/2019 - 03/03/2019 Radiation Therapy   IMRT with concurrent Temozolomide 31m/m2   03/28/2019 - 09/04/2019 Chemotherapy   5 cycles of adjuvant 5-day Temozolomide 2059mm2    09/01/2019 Progression   Progression of disease #1   09/19/2019 -  Chemotherapy   Begins second line therapy with oral CCNU 9046m2 q6 weeks, concurrent Avastin 2m63m IV q2 weeks   04/30/2020 -  Chemotherapy   Completes 4th cycle of CCNU/Avastin, transitions to Avastin monotherapy due to cytopenias   08/27/2020 -  Chemotherapy   Resumes CCNU+Avastin after periventricular DWI signal change noted on MRI    12/14/2020 Progression   Progression of disease #2   12/19/2020 - 04/01/2021 Chemotherapy   Initiates daily metronomic Temodar 50mg44mwith Avastin 2mg/40m2 weeks   03/30/2021 Progression   Progression of disease #3   04/08/2021 -  Chemotherapy   Patient is on Treatment Plan : BRAIN GBM [facility] Irinotecan + Bevacizumab D1,15 q 28d       Biomarkers:  MGMT Unknown.  IDH 1/2 Wild type.  EGFR Unknown  TERT Unknown   Interval History:  Jillian VERNETA HAMIDInts today for planned infusion.  No significant changes since last week.  Decadron currently at 2mg BI27m She continues with struggling to get full sentences out for the most part, similar to prior visit.  No issues at all with weakness, numbness, no gait impairment since discharge.  No right sided  weakness. No seizures or headaches.  H+P (01/09/19) Patient presented to medical attention in early December with ~2 months history of right sided visual impairment.  She describes fuzzy or blurry vision on that side which was progressive over time, leading to at least one fall.  MRI brain demonstrated enhancing left occipital mass; this was subsequently resected by Dr. NundkumKathyrn Sheriff18/20.  She had no issues with surgery and has completed her steroid taper.  Continues on keppra daily but no history of any seizure. She has returned to work with only modest difficutly.  No issues walking or performing ADLs; lives alone but her sister and other family members are nearby.  Medications: Current Outpatient Medications on File Prior to Visit  Medication Sig Dispense Refill   amLODipine (NORVASC) 5 MG tablet Take 1 tablet (5 mg total) by mouth daily. 60 tablet 1   Biotin 1 MG CAPS Take 1 mg by mouth daily.     CALCIUM PO Take 1 tablet by mouth daily.     dexamethasone (DECADRON) 4 MG tablet Take 0.5 tablets (2 mg total) by mouth daily. 30 tablet 1   doxycycline (VIBRA-TABS) 100 MG tablet Take 1 tablet by mouth daily. (Patient not taking: Reported on 11/05/2020)     levothyroxine (SYNTHROID) 75 MCG tablet Take 75 mcg by mouth daily.     naproxen sodium (ALEVE) 220 MG tablet Take 1 tablet (220 mg total) by mouth daily as needed (pain). (Patient not taking: Reported on 03/18/2021)     omeprazole (PRILOSEC) 20 MG  capsule Take 1 capsule (20 mg total) by mouth daily. 30 capsule 3   ondansetron (ZOFRAN) 8 MG tablet Take 1 tablet (8 mg total) by mouth 2 (two) times daily as needed for nausea or vomiting. 20 tablet 0   rosuvastatin (CRESTOR) 10 MG tablet Take 5 mg by mouth at bedtime.     sertraline (ZOLOFT) 25 MG tablet Take 1 tablet (25 mg total) by mouth daily. 60 tablet 1   valACYclovir (VALTREX) 500 MG tablet Take 1,000 mg by mouth 3 (three) times daily.     No current facility-administered medications on  file prior to visit.    Allergies:  Allergies  Allergen Reactions   Etodolac Other (See Comments)    GI upset   Macrobid [Nitrofurantoin] Rash   Past Medical History:  Past Medical History:  Diagnosis Date   High cholesterol    Hypothyroidism    Joint pain    Thrombocytopenia ([facility]) 02/26/2020   Past Surgical History:  Past Surgical History:  Procedure Laterality Date   APPLICATION OF CRANIAL NAVIGATION Left 12/23/2018   Procedure: APPLICATION OF CRANIAL NAVIGATION;  Surgeon: Consuella Lose, MD;  Location: [facility];  Service: Neurosurgery;  Laterality: Left;  APPLICATION OF CRANIAL NAVIGATION   BREAST BIOPSY Left    COLONOSCOPY     CRANIOTOMY Left 12/23/2018   Procedure: STEREOTACTIC LEFT OCCIPITAL CRANIOTOMY FOR RESECTION OF TUMOR;  Surgeon: Consuella Lose, MD;  Location: [facility];  Service: Neurosurgery;  Laterality: Left;  STEREOTACTIC LEFT OCCIPITAL CRANIOTOMY FOR RESECTION OF TUMOR   LASIK     Social History:  Social History   Socioeconomic History   Marital status: Single    Spouse name: Not on file   Number of children: Not on file   Years of education: Not on file   Highest education level: Not on file  Occupational History   Not on file  Tobacco Use   Smoking status: Never   Smokeless tobacco: Never  Vaping Use   Vaping Use: Never used  Substance and Sexual Activity   Alcohol use: Not Currently   Drug use: Never   Sexual activity: Not on file  Other Topics Concern   Not on file  Social History Narrative   Not on file   Social Determinants of Health   Financial Resource Strain: Not on file  Food Insecurity: Not on file  Transportation Needs: Not on file  Physical Activity: Not on file  Stress: Not on file  Social Connections: Not on file  Intimate Partner Violence: Not on file   Family History: No family history on file.  Review of Systems: Constitutional: Denies fevers, chills or abnormal weight loss Eyes: Denies blurriness of  vision Gastrointestinal:  Denies nausea, constipation, diarrhea GU: Denies dysuria or incontinence Skin: Denies abnormal skin rashes Musculoskeletal: Denies joint pain, back or neck discomfort.  Behavioral/Psych: Denies anxiety, mood instability  Physical Exam: Vitals:   07/22/21 1031  BP: (!) 157/93  Pulse: 75  Resp: 16  Temp: (!) 97.3 F (36.3 C)  SpO2: 100%     KPS: 70. General: Alert, cooperative, pleasant, in no acute distress Head: Craniotomy scar noted, dry and intact. EENT: No conjunctival injection or scleral icterus. Oral mucosa moist Lungs: Resp effort normal Cardiac: Regular rate and rhythm Abdomen: Soft, non-distended abdomen Skin: No rashes cyanosis or petechiae. Extremities: Left shin skin level cut, bleeding  Neurologic Exam: Mental Status: Awake, alert, attentive to examiner. Oriented to self and environment. Language is notable for notable expressive dyshpasia. Cranial  Nerves: Visual acuity is grossly normal. Right homonymous hemianopia. Extra-ocular movements intact. No ptosis. Face is symmetric, tongue midline. Motor: Tone and bulk are normal. Power is full in both arms and legs. Reflexes are symmetric, no pathologic reflexes present. Intact finger to nose bilaterally Sensory: Intact to light touch and temperature Gait: Normal and tandem gait is deferred.   Labs: I have reviewed the data as listed    Component Value Date/Time   NA 135 07/22/2021 1018   K 4.1 07/22/2021 1018   CL 105 07/22/2021 1018   CO2 25 07/22/2021 1018   GLUCOSE 108 (H) 07/22/2021 1018   BUN 51 (H) 07/22/2021 1018   CREATININE 1.15 (H) 07/22/2021 1018   CALCIUM 8.3 (L) 07/22/2021 1018   PROT 5.2 (L) 07/22/2021 1018   ALBUMIN 3.4 (L) 07/22/2021 1018   AST 23 07/22/2021 1018   ALT 24 07/22/2021 1018   ALKPHOS 48 07/22/2021 1018   BILITOT 0.9 07/22/2021 1018   GFRNONAA 53 (L) 07/22/2021 1018   GFRAA >60 10/03/2019 1133   Lab Results  Component Value Date   WBC 8.5  07/22/2021   NEUTROABS 6.7 07/22/2021   HGB 10.2 (L) 07/22/2021   HCT 29.3 (L) 07/22/2021   MCV 95.1 07/22/2021   PLT 91 (L) 07/22/2021    Assessment/Plan Glioblastoma, IDH-1 WT  TEARAH SAULSBURY is clinically stable today.  Labs are improved today, despite some lingering thrombocytopenia.    We will move forward with irinotecan and avastin today, but will add Udenyca injection for bone marrow support on 7/20.  Chemotherapy should be held for the following:  ANC less than 1,000  Platelets less than 100,000  LFT or creatinine greater than 2x ULN  If clinical concerns/contraindications develop  Avastin should be held for the following:   ANC less than 500  Platelets less than 50,000  LFT or creatinine greater than 2x ULN  If clinical concerns/contraindications develop  Con't norvasc to 23m daily.  Decadron may decrease to 264mdaily.  We ask that RoTRESSY KUNZMANeturn to clinic 14 days for next cycle of cpt-11/avastin and MRI brain review.  All questions were answered. The patient knows to call the clinic with any problems, questions or concerns. No barriers to learning were detected.  I have spent a total of 30 minutes of face-to-face and non-face-to-face time, excluding clinical staff time, preparing to see patient, ordering tests and/or medications, counseling the patient, and independently interpreting results and communicating results to the patient/family/caregiver    ZaVentura SellersMD Medical Director of Neuro-Oncology [facility]7/18/23 10:27 AM

## 2021-07-22 NOTE — Progress Notes (Signed)
Ok to treat today with Avastin and Irinotecan with current labs and vitals.  Will get injection on Thursday.

## 2021-07-22 NOTE — Progress Notes (Signed)
Provided one complimentary case of Ensure Plus High Protein 

## 2021-07-22 NOTE — Patient Instructions (Signed)
[facility]  Discharge Instructions: Thank you for choosing [facility] to provide your oncology and hematology care.   If you have a lab appointment with the [facility], please go directly to the [facility] and check in at the registration area.   Wear comfortable clothing and clothing appropriate for easy access to any Portacath or PICC line.   We strive to give you quality time with your provider. You may need to reschedule your appointment if you arrive late (15 or more minutes).  Arriving late affects you and other patients whose appointments are after yours.  Also, if you miss three or more appointments without notifying the office, you may be dismissed from the clinic at the provider's discretion.      For prescription refill requests, have your pharmacy contact our office and allow 72 hours for refills to be completed.    Today you received the following chemotherapy and/or immunotherapy agents: Irinotecan and Bevacizumab       To help prevent nausea and vomiting after your treatment, we encourage you to take your nausea medication as directed.  BELOW ARE SYMPTOMS THAT SHOULD BE REPORTED IMMEDIATELY: *FEVER GREATER THAN 100.4 F (38 C) OR HIGHER *CHILLS OR SWEATING *NAUSEA AND VOMITING THAT IS NOT CONTROLLED WITH YOUR NAUSEA MEDICATION *UNUSUAL SHORTNESS OF BREATH *UNUSUAL BRUISING OR BLEEDING *URINARY PROBLEMS (pain or burning when urinating, or frequent urination) *BOWEL PROBLEMS (unusual diarrhea, constipation, pain near the anus) TENDERNESS IN MOUTH AND THROAT WITH OR WITHOUT PRESENCE OF ULCERS (sore throat, sores in mouth, or a toothache) UNUSUAL RASH, SWELLING OR PAIN  UNUSUAL VAGINAL DISCHARGE OR ITCHING   Items with * indicate a potential emergency and should be followed up as soon as possible or go to the Emergency Department if any problems should occur.  Please show the CHEMOTHERAPY ALERT CARD or IMMUNOTHERAPY ALERT  CARD at check-in to the Emergency Department and triage nurse.  Should you have questions after your visit or need to cancel or reschedule your appointment, please contact [facility]  Dept: [phone]  and follow the prompts.  Office hours are 8:00 a.m. to 4:30 p.m. Monday - Friday. Please note that voicemails left after 4:00 p.m. may not be returned until the following business day.  We are closed weekends and major holidays. You have access to a nurse at all times for urgent questions. Please call the main number to the clinic Dept: [phone] and follow the prompts.   For any non-urgent questions, you may also contact your provider using MyChart. We now offer e-Visits for anyone 61 and older to request care online for non-urgent symptoms. For details visit [email or web address].   Also download the MyChart app! Go to the app store, search "MyChart", open the app, select [facility], and log in with your MyChart username and password.  Masks are optional in the cancer centers. If you would like for your care team to wear a mask while they are taking care of you, please let them know. For doctor visits, patients may have with them one support person who is at least 65 years old. At this time, visitors are not allowed in the infusion area.

## 2021-07-23 ENCOUNTER — Telehealth: Payer: Self-pay | Admitting: Internal Medicine

## 2021-07-23 NOTE — Telephone Encounter (Signed)
Per 7/18 los called and spoke to pt sister about appointment.  Pt sister confirmed appointment

## 2021-07-24 ENCOUNTER — Other Ambulatory Visit: Payer: Self-pay

## 2021-07-24 ENCOUNTER — Encounter: Payer: Self-pay | Admitting: Internal Medicine

## 2021-07-24 ENCOUNTER — Inpatient Hospital Stay: Payer: Medicare Other

## 2021-07-24 VITALS — BP 139/67 | HR 72 | Temp 97.9°F | Resp 18

## 2021-07-24 DIAGNOSIS — D696 Thrombocytopenia, unspecified: Secondary | ICD-10-CM

## 2021-07-24 DIAGNOSIS — Z5112 Encounter for antineoplastic immunotherapy: Secondary | ICD-10-CM | POA: Diagnosis not present

## 2021-07-24 DIAGNOSIS — T451X5A Adverse effect of antineoplastic and immunosuppressive drugs, initial encounter: Secondary | ICD-10-CM

## 2021-07-24 MED ORDER — PEGFILGRASTIM-CBQV 6 MG/0.6ML ~~LOC~~ SOSY
6.0000 mg | PREFILLED_SYRINGE | Freq: Once | SUBCUTANEOUS | Status: AC
  Administered 2021-07-24: 6 mg via SUBCUTANEOUS
  Filled 2021-07-24: qty 0.6

## 2021-07-24 NOTE — Patient Instructions (Signed)

## 2021-07-28 ENCOUNTER — Other Ambulatory Visit: Payer: Self-pay

## 2021-08-01 ENCOUNTER — Ambulatory Visit (HOSPITAL_COMMUNITY)
Admission: RE | Admit: 2021-08-01 | Discharge: 2021-08-01 | Disposition: A | Discharge status: Home / Self Care | Payer: Medicare Other | Source: Ambulatory Visit | Attending: Internal Medicine | Admitting: Internal Medicine

## 2021-08-01 DIAGNOSIS — C713 Malignant neoplasm of parietal lobe: Secondary | ICD-10-CM | POA: Diagnosis present

## 2021-08-01 MED ORDER — GADOBUTROL 1 MMOL/ML IV SOLN
4.0000 mL | Freq: Once | INTRAVENOUS | Status: AC | PRN
  Administered 2021-08-01: 4 mL via INTRAVENOUS

## 2021-08-04 ENCOUNTER — Inpatient Hospital Stay: Payer: Medicare Other

## 2021-08-05 ENCOUNTER — Inpatient Hospital Stay (HOSPITAL_BASED_OUTPATIENT_CLINIC_OR_DEPARTMENT_OTHER): Payer: Medicare Other | Admitting: Internal Medicine

## 2021-08-05 ENCOUNTER — Inpatient Hospital Stay: Payer: Medicare Other | Attending: Internal Medicine

## 2021-08-05 ENCOUNTER — Other Ambulatory Visit: Payer: Self-pay

## 2021-08-05 ENCOUNTER — Inpatient Hospital Stay: Payer: Medicare Other

## 2021-08-05 VITALS — BP 152/75 | HR 85 | Temp 98.2°F | Resp 18 | Ht 62.0 in | Wt 99.0 lb

## 2021-08-05 DIAGNOSIS — Z9221 Personal history of antineoplastic chemotherapy: Secondary | ICD-10-CM | POA: Diagnosis not present

## 2021-08-05 DIAGNOSIS — Z7952 Long term (current) use of systemic steroids: Secondary | ICD-10-CM | POA: Insufficient documentation

## 2021-08-05 DIAGNOSIS — Z5112 Encounter for antineoplastic immunotherapy: Secondary | ICD-10-CM | POA: Diagnosis present

## 2021-08-05 DIAGNOSIS — Z79899 Other long term (current) drug therapy: Secondary | ICD-10-CM | POA: Insufficient documentation

## 2021-08-05 DIAGNOSIS — C719 Malignant neoplasm of brain, unspecified: Secondary | ICD-10-CM

## 2021-08-05 DIAGNOSIS — D696 Thrombocytopenia, unspecified: Secondary | ICD-10-CM | POA: Diagnosis not present

## 2021-08-05 DIAGNOSIS — C714 Malignant neoplasm of occipital lobe: Secondary | ICD-10-CM | POA: Diagnosis present

## 2021-08-05 DIAGNOSIS — R809 Proteinuria, unspecified: Secondary | ICD-10-CM | POA: Insufficient documentation

## 2021-08-05 DIAGNOSIS — Z923 Personal history of irradiation: Secondary | ICD-10-CM | POA: Insufficient documentation

## 2021-08-05 DIAGNOSIS — Z5111 Encounter for antineoplastic chemotherapy: Secondary | ICD-10-CM | POA: Diagnosis present

## 2021-08-05 LAB — CBC WITH DIFFERENTIAL (CANCER CENTER ONLY)
Abs Immature Granulocytes: 0.76 10*3/uL — ABNORMAL HIGH (ref 0.00–0.07)
Basophils Absolute: 0.1 10*3/uL (ref 0.0–0.1)
Basophils Relative: 0 %
Eosinophils Absolute: 0 10*3/uL (ref 0.0–0.5)
Eosinophils Relative: 0 %
HCT: 25.2 % — ABNORMAL LOW (ref 36.0–46.0)
Hemoglobin: 8.5 g/dL — ABNORMAL LOW (ref 12.0–15.0)
Immature Granulocytes: 4 %
Lymphocytes Relative: 5 %
Lymphs Abs: 1.1 10*3/uL (ref 0.7–4.0)
MCH: 33.6 pg (ref 26.0–34.0)
MCHC: 33.7 g/dL (ref 30.0–36.0)
MCV: 99.6 fL (ref 80.0–100.0)
Monocytes Absolute: 1.4 10*3/uL — ABNORMAL HIGH (ref 0.1–1.0)
Monocytes Relative: 7 %
Neutro Abs: 17 10*3/uL — ABNORMAL HIGH (ref 1.7–7.7)
Neutrophils Relative %: 84 %
Platelet Count: 87 10*3/uL — ABNORMAL LOW (ref 150–400)
RBC: 2.53 MIL/uL — ABNORMAL LOW (ref 3.87–5.11)
RDW: 17.1 % — ABNORMAL HIGH (ref 11.5–15.5)
Smear Review: NORMAL
WBC Count: 20.4 10*3/uL — ABNORMAL HIGH (ref 4.0–10.5)
nRBC: 0.4 % — ABNORMAL HIGH (ref 0.0–0.2)

## 2021-08-05 LAB — CMP (CANCER CENTER ONLY)
ALT: 15 U/L (ref 0–44)
AST: 19 U/L (ref 15–41)
Albumin: 3.2 g/dL — ABNORMAL LOW (ref 3.5–5.0)
Alkaline Phosphatase: 88 U/L (ref 38–126)
Anion gap: 6 (ref 5–15)
BUN: 36 mg/dL — ABNORMAL HIGH (ref 8–23)
CO2: 27 mmol/L (ref 22–32)
Calcium: 8 mg/dL — ABNORMAL LOW (ref 8.9–10.3)
Chloride: 107 mmol/L (ref 98–111)
Creatinine: 1.17 mg/dL — ABNORMAL HIGH (ref 0.44–1.00)
GFR, Estimated: 52 mL/min — ABNORMAL LOW (ref >60)
Glucose, Bld: 153 mg/dL — ABNORMAL HIGH (ref 70–99)
Potassium: 3.7 mmol/L (ref 3.5–5.1)
Sodium: 140 mmol/L (ref 135–145)
Total Bilirubin: 0.3 mg/dL (ref 0.3–1.2)
Total Protein: 5 g/dL — ABNORMAL LOW (ref 6.5–8.1)

## 2021-08-05 LAB — TOTAL PROTEIN, URINE DIPSTICK: Protein, ur: >2000 mg/dL — AB

## 2021-08-05 MED ORDER — GABAPENTIN 300 MG PO CAPS: 300.0000 mg | ORAL_CAPSULE | Freq: Two times a day (BID) | ORAL | 3 refills | Status: AC

## 2021-08-05 NOTE — Progress Notes (Signed)
[facility] at [facility] [place], [place] [address] [phone]   Interval Evaluation  Date of Service: 08/05/21 Patient Name: Jillian Gomez Patient MRN: [id number] Patient DOB: 06/03/1955 Provider: Ventura Sellers, MD  Identifying Statement:  Jillian Gomez is a 66 y.o. female with left occipital glioblastoma   Oncologic History: Oncology History  Glioblastoma with isocitrate dehydrogenase gene wildtype ([place])  12/23/2018 Surgery   Craniotomy, left occipital resection by Dr. Kathyrn Sheriff.  Path is GBM IDH-wt   01/23/2019 - 03/03/2019 Radiation Therapy   IMRT with concurrent Temozolomide 2m/m2   03/28/2019 - 09/04/2019 Chemotherapy   5 cycles of adjuvant 5-day Temozolomide 2054mm2    09/01/2019 Progression   Progression of disease #1   09/19/2019 -  Chemotherapy   Begins second line therapy with oral CCNU 9037m2 q6 weeks, concurrent Avastin 32m60m IV q2 weeks   04/30/2020 -  Chemotherapy   Completes 4th cycle of CCNU/Avastin, transitions to Avastin monotherapy due to cytopenias   08/27/2020 -  Chemotherapy   Resumes CCNU+Avastin after periventricular DWI signal change noted on MRI    12/14/2020 Progression   Progression of disease #2   12/19/2020 - 04/01/2021 Chemotherapy   Initiates daily metronomic Temodar 50mg57mwith Avastin 32mg/32m2 weeks   03/30/2021 Progression   Progression of disease #3   04/08/2021 -  Chemotherapy   Patient is on Treatment Plan : BRAIN GBM [facility] Irinotecan + Bevacizumab D1,15 q 28d       Biomarkers:  MGMT Unknown.  IDH 1/2 Wild type.  EGFR Unknown  TERT Unknown   Interval History:  Jillian SHERREY NORTHnts today for planned infusion after recent MRI brain.  No significant changes over past two weeks.  Decadron currently at 2mg da45m.  Language issues are stable.  No issues at all with weakness, numbness, no gait impairment since discharge. No seizures or headaches.  H+P (01/09/19) Patient  presented to medical attention in early December with ~2 months history of right sided visual impairment.  She describes fuzzy or blurry vision on that side which was progressive over time, leading to at least one fall.  MRI brain demonstrated enhancing left occipital mass; this was subsequently resected by Dr. NundkumKathyrn Sheriff18/20.  She had no issues with surgery and has completed her steroid taper.  Continues on keppra daily but no history of any seizure. She has returned to work with only modest difficutly.  No issues walking or performing ADLs; lives alone but her sister and other family members are nearby.  Medications: Current Outpatient Medications on File Prior to Visit  Medication Sig Dispense Refill   amLODipine (NORVASC) 5 MG tablet Take 1 tablet (5 mg total) by mouth daily. 60 tablet 1   Biotin 1 MG CAPS Take 1 mg by mouth daily.     CALCIUM PO Take 1 tablet by mouth daily.     dexamethasone (DECADRON) 4 MG tablet Take 0.5 tablets (2 mg total) by mouth daily. 30 tablet 1   doxycycline (VIBRA-TABS) 100 MG tablet Take 1 tablet by mouth daily. (Patient not taking: Reported on 11/05/2020)     levothyroxine (SYNTHROID) 75 MCG tablet Take 75 mcg by mouth daily.     naproxen sodium (ALEVE) 220 MG tablet Take 1 tablet (220 mg total) by mouth daily as needed (pain). (Patient not taking: Reported on 03/18/2021)     omeprazole (PRILOSEC) 20 MG capsule Take 1 capsule (20 mg total) by mouth daily. 30 capsule 3  ondansetron (ZOFRAN) 8 MG tablet Take 1 tablet (8 mg total) by mouth 2 (two) times daily as needed for nausea or vomiting. 20 tablet 0   rosuvastatin (CRESTOR) 10 MG tablet Take 5 mg by mouth at bedtime.     sertraline (ZOLOFT) 25 MG tablet Take 1 tablet (25 mg total) by mouth daily. 60 tablet 1   No current facility-administered medications on file prior to visit.    Allergies:  Allergies  Allergen Reactions   Etodolac Other (See Comments)    GI upset   Macrobid [Nitrofurantoin] Rash    Past Medical History:  Past Medical History:  Diagnosis Date   High cholesterol    Hypothyroidism    Joint pain    Thrombocytopenia ([place]) 02/26/2020   Past Surgical History:  Past Surgical History:  Procedure Laterality Date   APPLICATION OF CRANIAL NAVIGATION Left 12/23/2018   Procedure: APPLICATION OF CRANIAL NAVIGATION;  Surgeon: Consuella Lose, MD;  Location: [facility];  Service: Neurosurgery;  Laterality: Left;  APPLICATION OF CRANIAL NAVIGATION   BREAST BIOPSY Left    COLONOSCOPY     CRANIOTOMY Left 12/23/2018   Procedure: STEREOTACTIC LEFT OCCIPITAL CRANIOTOMY FOR RESECTION OF TUMOR;  Surgeon: Consuella Lose, MD;  Location: [facility];  Service: Neurosurgery;  Laterality: Left;  STEREOTACTIC LEFT OCCIPITAL CRANIOTOMY FOR RESECTION OF TUMOR   LASIK     Social History:  Social History   Socioeconomic History   Marital status: Single    Spouse name: Not on file   Number of children: Not on file   Years of education: Not on file   Highest education level: Not on file  Occupational History   Not on file  Tobacco Use   Smoking status: Never   Smokeless tobacco: Never  Vaping Use   Vaping Use: Never used  Substance and Sexual Activity   Alcohol use: Not Currently   Drug use: Never   Sexual activity: Not on file  Other Topics Concern   Not on file  Social History Narrative   Not on file   Social Determinants of Health   Financial Resource Strain: Not on file  Food Insecurity: Not on file  Transportation Needs: Not on file  Physical Activity: Not on file  Stress: Not on file  Social Connections: Not on file  Intimate Partner Violence: Not on file   Family History: No family history on file.  Review of Systems: Constitutional: Denies fevers, chills or abnormal weight loss Eyes: Denies blurriness of vision Gastrointestinal:  Denies nausea, constipation, diarrhea GU: Denies dysuria or incontinence Skin: Denies abnormal skin rashes Musculoskeletal: Denies  joint pain, back or neck discomfort.  Behavioral/Psych: Denies anxiety, mood instability  Physical Exam: Vitals:   08/05/21 1224  BP: (!) 152/75  Pulse: 85  Resp: 18  Temp: 98.2 F (36.8 C)  SpO2: 100%     KPS: 70. General: Alert, cooperative, pleasant, in no acute distress Head: Craniotomy scar noted, dry and intact. EENT: No conjunctival injection or scleral icterus. Oral mucosa moist Lungs: Resp effort normal Cardiac: Regular rate and rhythm Abdomen: Soft, non-distended abdomen Skin: No rashes cyanosis or petechiae. Extremities: Left shin skin level cut, bleeding  Neurologic Exam: Mental Status: Awake, alert, attentive to examiner. Oriented to self and environment. Language is notable for notable expressive dyshpasia. Cranial Nerves: Visual acuity is grossly normal. Right homonymous hemianopia. Extra-ocular movements intact. No ptosis. Face is symmetric, tongue midline. Motor: Tone and bulk are normal. Power is full in both arms and legs. Reflexes  are symmetric, no pathologic reflexes present. Intact finger to nose bilaterally Sensory: Intact to light touch and temperature Gait: Normal and tandem gait is deferred.   Labs: I have reviewed the data as listed    Component Value Date/Time   NA 135 07/22/2021 1018   K 4.1 07/22/2021 1018   CL 105 07/22/2021 1018   CO2 25 07/22/2021 1018   GLUCOSE 108 (H) 07/22/2021 1018   BUN 51 (H) 07/22/2021 1018   CREATININE 1.15 (H) 07/22/2021 1018   CALCIUM 8.3 (L) 07/22/2021 1018   PROT 5.2 (L) 07/22/2021 1018   ALBUMIN 3.4 (L) 07/22/2021 1018   AST 23 07/22/2021 1018   ALT 24 07/22/2021 1018   ALKPHOS 48 07/22/2021 1018   BILITOT 0.9 07/22/2021 1018   GFRNONAA 53 (L) 07/22/2021 1018   GFRAA >60 10/03/2019 1133   Lab Results  Component Value Date   WBC 20.4 (H) 08/05/2021   NEUTROABS PENDING 08/05/2021   HGB 8.5 (L) 08/05/2021   HCT 25.2 (L) 08/05/2021   MCV 99.6 08/05/2021   PLT 87 (L) 08/05/2021   Imaging:  [place]  Clinician Interpretation: I have personally reviewed the CNS images as listed.  My interpretation, in the context of the patient's clinical presentation, is stable disease  MR BRAIN W WO CONTRAST  Result Date: 08/01/2021 CLINICAL DATA:  Brain/CNS neoplasm. Assess treatment response. Glioblastoma. IDH wild-type. EXAM: MRI HEAD WITHOUT AND WITH CONTRAST TECHNIQUE: Multiplanar, multiecho pulse sequences of the brain and surrounding structures were obtained without and with intravenous contrast. CONTRAST:  40m GADAVIST GADOBUTROL 1 MMOL/ML IV SOLN COMPARISON:  [facility]. MR head without and with contrast 06/21/2021. FINDINGS: Brain: Compared to the 06/21/2021 study on the same scanner, the extent of enhancement, T2 hyperintensity and mass effect has decreased. The enhancing lesion now measures 5.7 x 3.2 cm compared with 6.2 x 3.3 cm previously. Nodular enhancement adjacent to the atrium of the left lateral ventricle is again noted. Areas of restricted diffusion are slightly more prominent along the margin of the left lateral ventricle. No distant enhancement is present. No acute infarct or hemorrhage is present. The ventricles are of normal size. No significant extraaxial fluid collection is present. Mild periventricular white matter changes are noted bilaterally. The internal auditory canals are within normal limits. The brainstem and cerebellum are within normal limits. Vascular: Flow is present in the major intracranial arteries. Skull and upper cervical spine: The craniocervical junction is normal. Upper cervical spine is within normal limits. Marrow signal is unremarkable. Sinuses/Orbits: The paranasal sinuses and mastoid air cells are clear. Right lens replacement is present. Globes and orbits are otherwise within normal limits bilaterally. Other: IMPRESSION: 1. Slight decrease in extent of enhancement and T2 hyperintensity adjacent to the atrium of the left  lateral ventricle. Mass effect has decreased. The area of restricted diffusion is increased somewhat. The overall picture is a response to therapy. Recommend continued surveillance. 2. No other acute intracranial abnormality or significant interval change. 3. Mild periventricular white matter changes bilaterally likely related to prior therapy. Electronically Signed   By: CSan MorelleM.D.   On: 08/01/2021 21:37    Assessment/Plan Glioblastoma, IDH-1 WT  RSUMAYYAH CUSTODIOis clinically stable today.  MRI brain demonstrates stable findings, good response to resumption of therapy with CPT-11 and avastin.  We will postpone irinotecan and avastin today given ongoing thrombocytopenia and proteinuria.  Chemotherapy should be held for the following:  ANC less than 1,000  Platelets less than 100,000  LFT or creatinine greater than 2x ULN  If clinical concerns/contraindications develop  Avastin should be held for the following:   ANC less than 500  Platelets less than 50,000  LFT or creatinine greater than 2x ULN  If clinical concerns/contraindications develop  Con't norvasc to 49m daily.  We will prescribe trial of gabapentin 3060mBID for post-herpetic neuralgia pain along chest wall.  Decadron may con't at 78m66maily.  We ask that RonTURNER KUNZMANturn to clinic 14 days for next cycle of cpt-11/avastin, pending labs, etc.  All questions were answered. The patient knows to call the clinic with any problems, questions or concerns. No barriers to learning were detected.  I have spent a total of 30 minutes of face-to-face and non-face-to-face time, excluding clinical staff time, preparing to see patient, ordering tests and/or medications, counseling the patient, and independently interpreting results and communicating results to the patient/family/caregiver    ZacVentura SellersD Medical Director of Neuro-Oncology [facility] [place]/01/23 12:29 PM

## 2021-08-08 ENCOUNTER — Other Ambulatory Visit: Payer: Self-pay

## 2021-08-11 ENCOUNTER — Other Ambulatory Visit: Payer: Self-pay

## 2021-08-19 ENCOUNTER — Inpatient Hospital Stay: Payer: Medicare Other

## 2021-08-19 ENCOUNTER — Other Ambulatory Visit: Payer: Self-pay

## 2021-08-19 ENCOUNTER — Inpatient Hospital Stay (HOSPITAL_BASED_OUTPATIENT_CLINIC_OR_DEPARTMENT_OTHER): Payer: Medicare Other | Admitting: Internal Medicine

## 2021-08-19 VITALS — BP 155/80 | HR 76 | Temp 97.5°F | Resp 16 | Ht 62.0 in | Wt 101.7 lb

## 2021-08-19 DIAGNOSIS — C719 Malignant neoplasm of brain, unspecified: Secondary | ICD-10-CM

## 2021-08-19 DIAGNOSIS — Z5112 Encounter for antineoplastic immunotherapy: Secondary | ICD-10-CM | POA: Diagnosis not present

## 2021-08-19 LAB — CMP (CANCER CENTER ONLY)
ALT: 24 U/L (ref 0–44)
AST: 26 U/L (ref 15–41)
Albumin: 3.7 g/dL (ref 3.5–5.0)
Alkaline Phosphatase: 63 U/L (ref 38–126)
Anion gap: 10 (ref 5–15)
BUN: 40 mg/dL — ABNORMAL HIGH (ref 8–23)
CO2: 21 mmol/L — ABNORMAL LOW (ref 22–32)
Calcium: 9 mg/dL (ref 8.9–10.3)
Chloride: 109 mmol/L (ref 98–111)
Creatinine: 1.19 mg/dL — ABNORMAL HIGH (ref 0.44–1.00)
GFR, Estimated: 51 mL/min — ABNORMAL LOW (ref >60)
Glucose, Bld: 96 mg/dL (ref 70–99)
Potassium: 4.2 mmol/L (ref 3.5–5.1)
Sodium: 140 mmol/L (ref 135–145)
Total Bilirubin: 0.3 mg/dL (ref 0.3–1.2)
Total Protein: 5.8 g/dL — ABNORMAL LOW (ref 6.5–8.1)

## 2021-08-19 LAB — CBC WITH DIFFERENTIAL (CANCER CENTER ONLY)
Abs Immature Granulocytes: 0.22 10*3/uL — ABNORMAL HIGH (ref 0.00–0.07)
Basophils Absolute: 0.1 10*3/uL (ref 0.0–0.1)
Basophils Relative: 0 %
Eosinophils Absolute: 0.1 10*3/uL (ref 0.0–0.5)
Eosinophils Relative: 1 %
HCT: 29.1 % — ABNORMAL LOW (ref 36.0–46.0)
Hemoglobin: 10 g/dL — ABNORMAL LOW (ref 12.0–15.0)
Immature Granulocytes: 2 %
Lymphocytes Relative: 13 %
Lymphs Abs: 1.9 10*3/uL (ref 0.7–4.0)
MCH: 34.2 pg — ABNORMAL HIGH (ref 26.0–34.0)
MCHC: 34.4 g/dL (ref 30.0–36.0)
MCV: 99.7 fL (ref 80.0–100.0)
Monocytes Absolute: 1.2 10*3/uL — ABNORMAL HIGH (ref 0.1–1.0)
Monocytes Relative: 8 %
Neutro Abs: 11.2 10*3/uL — ABNORMAL HIGH (ref 1.7–7.7)
Neutrophils Relative %: 76 %
Platelet Count: 194 10*3/uL (ref 150–400)
RBC: 2.92 MIL/uL — ABNORMAL LOW (ref 3.87–5.11)
RDW: 17.1 % — ABNORMAL HIGH (ref 11.5–15.5)
WBC Count: 14.6 10*3/uL — ABNORMAL HIGH (ref 4.0–10.5)
nRBC: 0.1 % (ref 0.0–0.2)

## 2021-08-19 LAB — TOTAL PROTEIN, URINE DIPSTICK: Protein, ur: >2000 mg/dL — AB

## 2021-08-19 MED ORDER — SODIUM CHLORIDE 0.9 % IV SOLN
125.0000 mg/m2 | Freq: Once | INTRAVENOUS | Status: AC
  Administered 2021-08-19: 180 mg via INTRAVENOUS
  Filled 2021-08-19: qty 9

## 2021-08-19 MED ORDER — SODIUM CHLORIDE 0.9 % IV SOLN: Freq: Once | INTRAVENOUS | Status: AC

## 2021-08-19 MED ORDER — ATROPINE SULFATE 1 MG/ML IV SOLN
1.0000 mg | Freq: Once | INTRAVENOUS | Status: AC | PRN
  Administered 2021-08-19: 1 mg via INTRAVENOUS

## 2021-08-19 MED ORDER — PALONOSETRON HCL INJECTION 0.25 MG/5ML
0.2500 mg | Freq: Once | INTRAVENOUS | Status: AC
  Administered 2021-08-19: 0.25 mg via INTRAVENOUS

## 2021-08-19 MED ORDER — SODIUM CHLORIDE 0.9 % IV SOLN
10.0000 mg | Freq: Once | INTRAVENOUS | Status: AC
  Administered 2021-08-19: 10 mg via INTRAVENOUS
  Filled 2021-08-19: qty 10

## 2021-08-19 MED ORDER — DEXAMETHASONE 2 MG PO TABS: 2.0000 mg | ORAL_TABLET | Freq: Every day | ORAL | 1 refills | Status: DC

## 2021-08-19 NOTE — Progress Notes (Signed)
Per Dr. Mickeal Skinner, okay to proceed with premeds for irinotecan prior to today's CMP results.

## 2021-08-19 NOTE — Progress Notes (Signed)
[facility] at [place], [place] [address] [phone]   Interval Evaluation  Date of Service: 08/19/21 Patient Name: Jillian Gomez Patient MRN: [id number] Patient DOB: [birth year] Provider: Ventura Sellers, MD  Identifying Statement:  Jillian Gomez is a 66 y.o. female with left occipital glioblastoma   Oncologic History: Oncology History  Glioblastoma with isocitrate dehydrogenase gene wildtype ([place])  12/23/2018 Surgery   Craniotomy, left occipital resection by Dr. Kathyrn Sheriff.  Path is GBM IDH-wt   01/23/2019 - 03/03/2019 Radiation Therapy   IMRT with concurrent Temozolomide 73m/m2   03/28/2019 - 09/04/2019 Chemotherapy   5 cycles of adjuvant 5-day Temozolomide 2075mm2    09/01/2019 Progression   Progression of disease #1   09/19/2019 -  Chemotherapy   Begins second line therapy with oral CCNU 9025m2 q6 weeks, concurrent Avastin 51m60m IV q2 weeks   04/30/2020 -  Chemotherapy   Completes 4th cycle of CCNU/Avastin, transitions to Avastin monotherapy due to cytopenias   08/27/2020 -  Chemotherapy   Resumes CCNU+Avastin after periventricular DWI signal change noted on MRI    12/14/2020 Progression   Progression of disease #2   12/19/2020 - 04/01/2021 Chemotherapy   Initiates daily metronomic Temodar 50mg35mwith Avastin 51mg/55m2 weeks   03/30/2021 Progression   Progression of disease #3   04/08/2021 -  Chemotherapy   Patient is on Treatment Plan : BRAIN GBM [facility] Irinotecan + Bevacizumab D1,15 q 28d       Biomarkers:  MGMT Unknown.  IDH 1/2 Wild type.  EGFR Unknown  TERT Unknown   Interval History:  Palin EVELYNA FOLKERnts today for planned CPT-11 and avastin.  No clinical changes described today.  Decadron currently at 2mg da92m.  Language issues are stable.  No issues at all with weakness, numbness, no gait impairment since discharge. No seizures or headaches.  H+P (01/09/19) Patient presented to medical  attention in early December with ~2 months history of right sided visual impairment.  She describes fuzzy or blurry vision on that side which was progressive over time, leading to at least one fall.  MRI brain demonstrated enhancing left occipital mass; this was subsequently resected by Dr. NundkumKathyrn Sheriff18/20.  She had no issues with surgery and has completed her steroid taper.  Continues on keppra daily but no history of any seizure. She has returned to work with only modest difficutly.  No issues walking or performing ADLs; lives alone but her sister and other family members are nearby.  Medications: Current Outpatient Medications on File Prior to Visit  Medication Sig Dispense Refill   amLODipine (NORVASC) 5 MG tablet Take 1 tablet (5 mg total) by mouth daily. 60 tablet 1   Biotin 1 MG CAPS Take 1 mg by mouth daily.     CALCIUM PO Take 1 tablet by mouth daily.     dexamethasone (DECADRON) 4 MG tablet Take 0.5 tablets (2 mg total) by mouth daily. 30 tablet 1   doxycycline (VIBRA-TABS) 100 MG tablet Take 1 tablet by mouth daily. (Patient not taking: Reported on 11/05/2020)     gabapentin (NEURONTIN) 300 MG capsule Take 1 capsule (300 mg total) by mouth 2 (two) times daily. 60 capsule 3   levothyroxine (SYNTHROID) 75 MCG tablet Take 75 mcg by mouth daily.     naproxen sodium (ALEVE) 220 MG tablet Take 1 tablet (220 mg total) by mouth daily as needed (pain). (Patient not taking: Reported on 03/18/2021)  omeprazole (PRILOSEC) 20 MG capsule Take 1 capsule (20 mg total) by mouth daily. 30 capsule 3   ondansetron (ZOFRAN) 8 MG tablet Take 1 tablet (8 mg total) by mouth 2 (two) times daily as needed for nausea or vomiting. 20 tablet 0   rosuvastatin (CRESTOR) 10 MG tablet Take 5 mg by mouth at bedtime.     sertraline (ZOLOFT) 25 MG tablet Take 1 tablet (25 mg total) by mouth daily. 60 tablet 1   No current facility-administered medications on file prior to visit.    Allergies:  Allergies   Allergen Reactions   Etodolac Other (See Comments)    GI upset   Macrobid [Nitrofurantoin] Rash   Past Medical History:  Past Medical History:  Diagnosis Date   High cholesterol    Hypothyroidism    Joint pain    Thrombocytopenia ([place]) 02/26/2020   Past Surgical History:  Past Surgical History:  Procedure Laterality Date   APPLICATION OF CRANIAL NAVIGATION Left 12/23/2018   Procedure: APPLICATION OF CRANIAL NAVIGATION;  Surgeon: Consuella Lose, MD;  Location: [facility] [place] [facility];  Service: Neurosurgery;  Laterality: Left;  APPLICATION OF CRANIAL NAVIGATION   BREAST BIOPSY Left    COLONOSCOPY     CRANIOTOMY Left 12/23/2018   Procedure: STEREOTACTIC LEFT OCCIPITAL CRANIOTOMY FOR RESECTION OF TUMOR;  Surgeon: Consuella Lose, MD;  Location: [facility];  Service: Neurosurgery;  Laterality: Left;  STEREOTACTIC LEFT OCCIPITAL CRANIOTOMY FOR RESECTION OF TUMOR   LASIK     Social History:  Social History   Socioeconomic History   Marital status: Single    Spouse name: Not on file   Number of children: Not on file   Years of education: Not on file   Highest education level: Not on file  Occupational History   Not on file  Tobacco Use   Smoking status: Never   Smokeless tobacco: Never  Vaping Use   Vaping Use: Never used  Substance and Sexual Activity   Alcohol use: Not Currently   Drug use: Never   Sexual activity: Not on file  Other Topics Concern   Not on file  Social History Narrative   Not on file   Social Determinants of Health   Financial Resource Strain: Not on file  Food Insecurity: Not on file  Transportation Needs: Not on file  Physical Activity: Not on file  Stress: Not on file  Social Connections: Not on file  Intimate Partner Violence: Not on file   Family History: No family history on file.  Review of Systems: Constitutional: Denies fevers, chills or abnormal weight loss Eyes: Denies blurriness of vision Gastrointestinal:  Denies nausea, constipation,  diarrhea GU: Denies dysuria or incontinence Skin: Denies abnormal skin rashes Musculoskeletal: Denies joint pain, back or neck discomfort.  Behavioral/Psych: Denies anxiety, mood instability  Physical Exam: Vitals:   08/19/21 0912  BP: (!) 155/80  Pulse: 76  Resp: 16  Temp: (!) 97.5 F (36.4 C)  SpO2: 100%     KPS: 70. General: Alert, cooperative, pleasant, in no acute distress Head: Craniotomy scar noted, dry and intact. EENT: No conjunctival injection or scleral icterus. Oral mucosa moist Lungs: Resp effort normal Cardiac: Regular rate and rhythm Abdomen: Soft, non-distended abdomen Skin: No rashes cyanosis or petechiae. Extremities: Left shin skin level cut, bleeding  Neurologic Exam: Mental Status: Awake, alert, attentive to examiner. Oriented to self and environment. Language is notable for notable expressive dyshpasia. Cranial Nerves: Visual acuity is grossly normal. Right homonymous hemianopia. Extra-ocular movements intact. No ptosis.  Face is symmetric, tongue midline. Motor: Tone and bulk are normal. Power is full in both arms and legs. Reflexes are symmetric, no pathologic reflexes present. Intact finger to nose bilaterally Sensory: Intact to light touch and temperature Gait: Normal and tandem gait is deferred.   Labs: I have reviewed the data as listed    Component Value Date/Time   NA 140 08/05/2021 1158   K 3.7 08/05/2021 1158   CL 107 08/05/2021 1158   CO2 27 08/05/2021 1158   GLUCOSE 153 (H) 08/05/2021 1158   BUN 36 (H) 08/05/2021 1158   CREATININE 1.17 (H) 08/05/2021 1158   CALCIUM 8.0 (L) 08/05/2021 1158   PROT 5.0 (L) 08/05/2021 1158   ALBUMIN 3.2 (L) 08/05/2021 1158   AST 19 08/05/2021 1158   ALT 15 08/05/2021 1158   ALKPHOS 88 08/05/2021 1158   BILITOT 0.3 08/05/2021 1158   GFRNONAA 52 (L) 08/05/2021 1158   GFRAA >60 10/03/2019 1133   Lab Results  Component Value Date   WBC 14.6 (H) 08/19/2021   NEUTROABS 11.2 (H) 08/19/2021   HGB 10.0  (L) 08/19/2021   HCT 29.1 (L) 08/19/2021   MCV 99.7 08/19/2021   PLT 194 08/19/2021     Assessment/Plan Glioblastoma, IDH-1 WT  IVIONA HOLE is clinically stable today.  Labs demonstrate improvement in cytopenias, but ongoing proteinuria.  We will administer irinotecan but withhold avastin today given ongoing proteinuria.  Once proteinuria resolves, we will dose-reduce avastin to 7.341m/kg from 128mkg.  Chemotherapy should be held for the following:  ANC less than 1,000  Platelets less than 100,000  LFT or creatinine greater than 2x ULN  If clinical concerns/contraindications develop  Avastin should be held for the following:   ANC less than 500  Platelets less than 50,000  LFT or creatinine greater than 2x ULN  If clinical concerns/contraindications develop  Con't norvasc to 41m36maily.  Decadron may con't at 2mg48mily.  We ask that RondYASMEN CORTNERurn to clinic 14 days for next cycle of cpt-11/avastin, pending labs, etc.  All questions were answered. The patient knows to call the clinic with any problems, questions or concerns. No barriers to learning were detected.  I have spent a total of 30 minutes of face-to-face and non-face-to-face time, excluding clinical staff time, preparing to see patient, ordering tests and/or medications, counseling the patient, and independently interpreting results and communicating results to the patient/family/caregiver    ZachVentura Sellers Medical Director of Neuro-Oncology [facility]ba15/23 9:14 AM

## 2021-08-20 ENCOUNTER — Other Ambulatory Visit: Payer: Self-pay

## 2021-08-20 ENCOUNTER — Telehealth: Payer: Self-pay | Admitting: Internal Medicine

## 2021-08-20 NOTE — Telephone Encounter (Signed)
Scheduled per 8/15 los, pt sister has been called and confirmed

## 2021-08-21 ENCOUNTER — Other Ambulatory Visit: Payer: Self-pay | Admitting: Internal Medicine

## 2021-08-21 DIAGNOSIS — C719 Malignant neoplasm of brain, unspecified: Secondary | ICD-10-CM

## 2021-09-01 ENCOUNTER — Other Ambulatory Visit: Payer: Self-pay | Admitting: Internal Medicine

## 2021-09-01 ENCOUNTER — Encounter: Payer: Self-pay | Admitting: Internal Medicine

## 2021-09-02 ENCOUNTER — Other Ambulatory Visit: Payer: Self-pay | Admitting: *Deleted

## 2021-09-02 ENCOUNTER — Other Ambulatory Visit: Payer: Self-pay

## 2021-09-02 ENCOUNTER — Inpatient Hospital Stay: Payer: Medicare Other

## 2021-09-02 ENCOUNTER — Inpatient Hospital Stay (HOSPITAL_BASED_OUTPATIENT_CLINIC_OR_DEPARTMENT_OTHER): Payer: Medicare Other | Admitting: Internal Medicine

## 2021-09-02 VITALS — BP 154/84 | HR 83 | Temp 97.7°F | Resp 16 | Wt 104.5 lb

## 2021-09-02 VITALS — BP 139/73 | HR 69 | Temp 97.7°F | Resp 16

## 2021-09-02 DIAGNOSIS — Z7189 Other specified counseling: Secondary | ICD-10-CM | POA: Diagnosis not present

## 2021-09-02 DIAGNOSIS — C719 Malignant neoplasm of brain, unspecified: Secondary | ICD-10-CM

## 2021-09-02 DIAGNOSIS — Z5112 Encounter for antineoplastic immunotherapy: Secondary | ICD-10-CM | POA: Diagnosis not present

## 2021-09-02 LAB — CBC WITH DIFFERENTIAL (CANCER CENTER ONLY)
Abs Immature Granulocytes: 0.06 10*3/uL (ref 0.00–0.07)
Basophils Absolute: 0 10*3/uL (ref 0.0–0.1)
Basophils Relative: 0 %
Eosinophils Absolute: 0 10*3/uL (ref 0.0–0.5)
Eosinophils Relative: 0 %
HCT: 29.9 % — ABNORMAL LOW (ref 36.0–46.0)
Hemoglobin: 10.2 g/dL — ABNORMAL LOW (ref 12.0–15.0)
Immature Granulocytes: 1 %
Lymphocytes Relative: 5 %
Lymphs Abs: 0.5 10*3/uL — ABNORMAL LOW (ref 0.7–4.0)
MCH: 34.2 pg — ABNORMAL HIGH (ref 26.0–34.0)
MCHC: 34.1 g/dL (ref 30.0–36.0)
MCV: 100.3 fL — ABNORMAL HIGH (ref 80.0–100.0)
Monocytes Absolute: 0.4 10*3/uL (ref 0.1–1.0)
Monocytes Relative: 4 %
Neutro Abs: 10.2 10*3/uL — ABNORMAL HIGH (ref 1.7–7.7)
Neutrophils Relative %: 90 %
Platelet Count: 184 10*3/uL (ref 150–400)
RBC: 2.98 MIL/uL — ABNORMAL LOW (ref 3.87–5.11)
RDW: 16.1 % — ABNORMAL HIGH (ref 11.5–15.5)
WBC Count: 11.2 10*3/uL — ABNORMAL HIGH (ref 4.0–10.5)
nRBC: 0 % (ref 0.0–0.2)

## 2021-09-02 LAB — TOTAL PROTEIN, URINE DIPSTICK: Protein, ur: 300 mg/dL — AB

## 2021-09-02 LAB — CMP (CANCER CENTER ONLY)
ALT: 18 U/L (ref 0–44)
AST: 17 U/L (ref 15–41)
Albumin: 3.7 g/dL (ref 3.5–5.0)
Alkaline Phosphatase: 62 U/L (ref 38–126)
Anion gap: 6 (ref 5–15)
BUN: 30 mg/dL — ABNORMAL HIGH (ref 8–23)
CO2: 26 mmol/L (ref 22–32)
Calcium: 8.6 mg/dL — ABNORMAL LOW (ref 8.9–10.3)
Chloride: 105 mmol/L (ref 98–111)
Creatinine: 0.97 mg/dL (ref 0.44–1.00)
GFR, Estimated: >60 mL/min (ref >60)
Glucose, Bld: 163 mg/dL — ABNORMAL HIGH (ref 70–99)
Potassium: 3.6 mmol/L (ref 3.5–5.1)
Sodium: 137 mmol/L (ref 135–145)
Total Bilirubin: 0.4 mg/dL (ref 0.3–1.2)
Total Protein: 5.6 g/dL — ABNORMAL LOW (ref 6.5–8.1)

## 2021-09-02 MED ORDER — SODIUM CHLORIDE 0.9 % IV SOLN
10.0000 mg | Freq: Once | INTRAVENOUS | Status: AC
  Administered 2021-09-02: 10 mg via INTRAVENOUS
  Filled 2021-09-02: qty 10

## 2021-09-02 MED ORDER — IRINOTECAN HCL CHEMO INJECTION 100 MG/5ML
125.0000 mg/m2 | Freq: Once | INTRAVENOUS | Status: AC
  Administered 2021-09-02: 180 mg via INTRAVENOUS
  Filled 2021-09-02: qty 9

## 2021-09-02 MED ORDER — ATROPINE SULFATE 1 MG/ML IV SOLN
0.5000 mg | Freq: Once | INTRAVENOUS | Status: AC | PRN
  Administered 2021-09-02: 0.5 mg via INTRAVENOUS
  Filled 2021-09-02: qty 1

## 2021-09-02 MED ORDER — SODIUM CHLORIDE 0.9 % IV SOLN
7.5000 mg/kg | Freq: Once | INTRAVENOUS | Status: AC
  Administered 2021-09-02: 350 mg via INTRAVENOUS
  Filled 2021-09-02: qty 14

## 2021-09-02 MED ORDER — SODIUM CHLORIDE 0.9 % IV SOLN: Freq: Once | INTRAVENOUS | Status: AC

## 2021-09-02 MED ORDER — PALONOSETRON HCL INJECTION 0.25 MG/5ML
0.2500 mg | Freq: Once | INTRAVENOUS | Status: AC
  Administered 2021-09-02: 0.25 mg via INTRAVENOUS
  Filled 2021-09-02: qty 5

## 2021-09-02 NOTE — Progress Notes (Signed)
Per Dr Mickeal Skinner patient will get Avastin (reduced dose) and Irinotecan today 09/02/2021 with current labs and vitals.

## 2021-09-02 NOTE — Progress Notes (Signed)
[facility] at [place],  [address] [phone]   Interval Evaluation  Date of Service: 09/02/21 Patient Name: Jillian Gomez Patient MRN: [id number] Patient DOB: 08-24-1955 Provider: Ventura Sellers, MD  Identifying Statement:  Jillian Gomez is a 66 y.o. female with left occipital glioblastoma   Oncologic History: Oncology History  Glioblastoma with isocitrate dehydrogenase gene wildtype ([place])  12/23/2018 Surgery   Craniotomy, left occipital resection by Dr. Kathyrn Sheriff.  Path is GBM IDH-wt   01/23/2019 - 03/03/2019 Radiation Therapy   IMRT with concurrent Temozolomide 30m/m2   03/28/2019 - 09/04/2019 Chemotherapy   5 cycles of adjuvant 5-day Temozolomide 2060mm2    09/01/2019 Progression   Progression of disease #1   09/19/2019 -  Chemotherapy   Begins second line therapy with oral CCNU 9067m2 q6 weeks, concurrent Avastin 101m48m IV q2 weeks   04/30/2020 -  Chemotherapy   Completes 4th cycle of CCNU/Avastin, transitions to Avastin monotherapy due to cytopenias   08/27/2020 -  Chemotherapy   Resumes CCNU+Avastin after periventricular DWI signal change noted on MRI    12/14/2020 Progression   Progression of disease #2   12/19/2020 - 04/01/2021 Chemotherapy   Initiates daily metronomic Temodar 50mg24mwith Avastin 101mg/25m2 weeks   03/30/2021 Progression   Progression of disease #3   04/08/2021 - 08/19/2021 Chemotherapy   Patient is on Treatment Plan : BRAIN GBM [facility] Irinotecan + Bevacizumab D1,15 q 28d     04/08/2021 -  Chemotherapy   Patient is on Treatment Plan : BRAIN GBM [facility] Irinotecan D1,15 + Bevacizumab D1,15 q 28d       Biomarkers:  MGMT Unknown.  IDH 1/2 Wild type.  EGFR Unknown  TERT Unknown   Interval History:  [name] today for planned CPT-11 and avastin.  No new or progressive changes reported today.  Decadron currently at 2mg da61m.  Language issues are stable.  No issues at  all with weakness, numbness, no gait impairment since discharge. No seizures or headaches.  H+P (01/09/19) Patient presented to medical attention in early December with ~2 months history of right sided visual impairment.  She describes fuzzy or blurry vision on that side which was progressive over time, leading to at least one fall.  MRI brain demonstrated enhancing left occipital mass; this was subsequently resected by Dr. NundkumKathyrn Sheriff18/20.  She had no issues with surgery and has completed her steroid taper.  Continues on keppra daily but no history of any seizure. She has returned to work with only modest difficutly.  No issues walking or performing ADLs; lives alone but her sister and other family members are nearby.  Medications: Current Outpatient Medications on File Prior to Visit  Medication Sig Dispense Refill   amLODipine (NORVASC) 5 MG tablet Take 1 tablet (5 mg total) by mouth daily. 60 tablet 1   Biotin 1 MG CAPS Take 1 mg by mouth daily.     CALCIUM PO Take 1 tablet by mouth daily.     dexamethasone (DECADRON) 2 MG tablet Take 1 tablet (2 mg total) by mouth daily. 60 tablet 1   doxycycline (VIBRA-TABS) 100 MG tablet Take 1 tablet by mouth daily. (Patient not taking: Reported on 11/05/2020)     gabapentin (NEURONTIN) 300 MG capsule Take 1 capsule (300 mg total) by mouth 2 (two) times daily. 60 capsule 3   levothyroxine (SYNTHROID) 75 MCG tablet Take 75 mcg by mouth daily.  naproxen sodium (ALEVE) 220 MG tablet Take 1 tablet (220 mg total) by mouth daily as needed (pain). (Patient not taking: Reported on 03/18/2021)     omeprazole (PRILOSEC) 20 MG capsule Take 1 capsule (20 mg total) by mouth daily. 30 capsule 3   ondansetron (ZOFRAN) 8 MG tablet Take 1 tablet (8 mg total) by mouth 2 (two) times daily as needed for nausea or vomiting. 20 tablet 0   rosuvastatin (CRESTOR) 10 MG tablet Take 5 mg by mouth at bedtime.     sertraline (ZOLOFT) 25 MG tablet Take 1 tablet (25 mg total) by  mouth daily. 60 tablet 1   No current facility-administered medications on file prior to visit.    Allergies:  Allergies  Allergen Reactions   Etodolac Other (See Comments)    GI upset   Macrobid [Nitrofurantoin] Rash   Past Medical History:  Past Medical History:  Diagnosis Date   High cholesterol    Hypothyroidism    Joint pain    Thrombocytopenia ([place]) 02/26/2020   Past Surgical History:  Past Surgical History:  Procedure Laterality Date   APPLICATION OF CRANIAL NAVIGATION Left 12/23/2018   Procedure: APPLICATION OF CRANIAL NAVIGATION;  Surgeon: Consuella Lose, MD;  Location: [facility];  Service: Neurosurgery;  Laterality: Left;  APPLICATION OF CRANIAL NAVIGATION   BREAST BIOPSY Left    COLONOSCOPY     CRANIOTOMY Left 12/23/2018   Procedure: STEREOTACTIC LEFT OCCIPITAL CRANIOTOMY FOR RESECTION OF TUMOR;  Surgeon: Consuella Lose, MD;  Location: [facility];  Service: Neurosurgery;  Laterality: Left;  STEREOTACTIC LEFT OCCIPITAL CRANIOTOMY FOR RESECTION OF TUMOR   LASIK     Social History:  Social History   Socioeconomic History   Marital status: Single    Spouse name: Not on file   Number of children: Not on file   Years of education: Not on file   Highest education level: Not on file  Occupational History   Not on file  Tobacco Use   Smoking status: Never   Smokeless tobacco: Never  Vaping Use   Vaping Use: Never used  Substance and Sexual Activity   Alcohol use: Not Currently   Drug use: Never   Sexual activity: Not on file  Other Topics Concern   Not on file  Social History Narrative   Not on file   Social Determinants of Health   Financial Resource Strain: Not on file  Food Insecurity: Not on file  Transportation Needs: Not on file  Physical Activity: Not on file  Stress: Not on file  Social Connections: Not on file  Intimate Partner Violence: Not on file   Family History: No family history on file.  Review of Systems: Constitutional: Denies  fevers, chills or abnormal weight loss Eyes: Denies blurriness of vision Gastrointestinal:  Denies nausea, constipation, diarrhea GU: Denies dysuria or incontinence Skin: Denies abnormal skin rashes Musculoskeletal: Denies joint pain, back or neck discomfort.  Behavioral/Psych: Denies anxiety, mood instability  Physical Exam: Vitals:   09/02/21 1246  BP: (!) 154/84  Pulse: 83  Resp: 16  Temp: 97.7 F (36.5 C)  SpO2: 100%   KPS: 70. General: Alert, cooperative, pleasant, in no acute distress Head: Craniotomy scar noted, dry and intact. EENT: No conjunctival injection or scleral icterus. Oral mucosa moist Lungs: Resp effort normal Cardiac: Regular rate and rhythm Abdomen: Soft, non-distended abdomen Skin: No rashes cyanosis or petechiae. Extremities: Left shin skin level cut, bleeding  Neurologic Exam: Mental Status: Awake, alert, attentive to examiner. Oriented to  self and environment. Language is notable for notable expressive dyshpasia. Cranial Nerves: Visual acuity is grossly normal. Right homonymous hemianopia. Extra-ocular movements intact. No ptosis. Face is symmetric, tongue midline. Motor: Tone and bulk are normal. Power is full in both arms and legs. Reflexes are symmetric, no pathologic reflexes present. Intact finger to nose bilaterally Sensory: Intact to light touch and temperature Gait: Normal and tandem gait is deferred.   Labs: I have reviewed the data as listed    Component Value Date/Time   NA 140 08/19/2021 0846   K 4.2 08/19/2021 0846   CL 109 08/19/2021 0846   CO2 21 (L) 08/19/2021 0846   GLUCOSE 96 08/19/2021 0846   BUN 40 (H) 08/19/2021 0846   CREATININE 1.19 (H) 08/19/2021 0846   CALCIUM 9.0 08/19/2021 0846   PROT 5.8 (L) 08/19/2021 0846   ALBUMIN 3.7 08/19/2021 0846   AST 26 08/19/2021 0846   ALT 24 08/19/2021 0846   ALKPHOS 63 08/19/2021 0846   BILITOT 0.3 08/19/2021 0846   GFRNONAA 51 (L) 08/19/2021 0846   GFRAA >60 10/03/2019 1133    Lab Results  Component Value Date   WBC 11.2 (H) 09/02/2021   NEUTROABS 10.2 (H) 09/02/2021   HGB 10.2 (L) 09/02/2021   HCT 29.9 (L) 09/02/2021   MCV 100.3 (H) 09/02/2021   PLT 184 09/02/2021     Assessment/Plan Glioblastoma, IDH-1 WT  KAYTLIN BURKLOW is clinically stable today.  Labs demonstrate improvement in cytopenias and proteinuria.  Avastin will be dose reduced today to 7.223m/kg.  Irinotecan will con't at 124mm2 as prior.  Chemotherapy should be held for the following:  ANC less than 1,000  Platelets less than 100,000  LFT or creatinine greater than 2x ULN  If clinical concerns/contraindications develop  Avastin should be held for the following:   ANC less than 500  Platelets less than 50,000  LFT or creatinine greater than 2x ULN  If clinical concerns/contraindications develop  Con't norvasc to 23m723maily.  Decadron may con't at 2mg37mily.  We ask that RondUDELL BLASINGAMEurn to clinic in 21 days for next cycle of cpt-11/avastin, pending labs, etc.  All questions were answered. The patient knows to call the clinic with any problems, questions or concerns. No barriers to learning were detected.  I have spent a total of 30 minutes of face-to-face and non-face-to-face time, excluding clinical staff time, preparing to see patient, ordering tests and/or medications, counseling the patient, and independently interpreting results and communicating results to the patient/family/caregiver    ZachVentura Sellers Medical Director of Neuro-Oncology [facility]29/23 12:45 PM

## 2021-09-02 NOTE — Patient Instructions (Signed)
[facility] ONCOLOGY  Discharge Instructions: Thank you for choosing [facility] to provide your oncology and hematology care.   If you have a lab appointment with the [facility], please go directly to the [facility] and check in at the registration area.   Wear comfortable clothing and clothing appropriate for easy access to any Portacath or PICC line.   We strive to give you quality time with your provider. You may need to reschedule your appointment if you arrive late (15 or more minutes).  Arriving late affects you and other patients whose appointments are after yours.  Also, if you miss three or more appointments without notifying the office, you may be dismissed from the clinic at the provider's discretion.      For prescription refill requests, have your pharmacy contact our office and allow 72 hours for refills to be completed.    Today you received the following chemotherapy and/or immunotherapy agents: Irinotecan and Bevacizumab       To help prevent nausea and vomiting after your treatment, we encourage you to take your nausea medication as directed.  BELOW ARE SYMPTOMS THAT SHOULD BE REPORTED IMMEDIATELY: *FEVER GREATER THAN 100.4 F (38 C) OR HIGHER *CHILLS OR SWEATING *NAUSEA AND VOMITING THAT IS NOT CONTROLLED WITH YOUR NAUSEA MEDICATION *UNUSUAL SHORTNESS OF BREATH *UNUSUAL BRUISING OR BLEEDING *URINARY PROBLEMS (pain or burning when urinating, or frequent urination) *BOWEL PROBLEMS (unusual diarrhea, constipation, pain near the anus) TENDERNESS IN MOUTH AND THROAT WITH OR WITHOUT PRESENCE OF ULCERS (sore throat, sores in mouth, or a toothache) UNUSUAL RASH, SWELLING OR PAIN  UNUSUAL VAGINAL DISCHARGE OR ITCHING   Items with * indicate a potential emergency and should be followed up as soon as possible or go to the Emergency Department if any problems should occur.  Please show the CHEMOTHERAPY ALERT CARD or IMMUNOTHERAPY ALERT  CARD at check-in to the Emergency Department and triage nurse.  Should you have questions after your visit or need to cancel or reschedule your appointment, please contact [facility]  Dept: [phone]  and follow the prompts.  Office hours are 8:00 a.m. to 4:30 p.m. Monday - Friday. Please note that voicemails left after 4:00 p.m. may not be returned until the following business day.  We are closed weekends and major holidays. You have access to a nurse at all times for urgent questions. Please call the main number to the clinic Dept: [phone] and follow the prompts.   For any non-urgent questions, you may also contact your provider using MyChart. We now offer e-Visits for anyone 65 and older to request care online for non-urgent symptoms. For details visit [email or web address].   Also download the MyChart app! Go to the app store, search "MyChart", open the app, select [facility], and log in with your MyChart username and password.  Masks are optional in the cancer centers. If you would like for your care team to wear a mask while they are taking care of you, please let them know. For doctor visits, patients may have with them one support person who is at least 65 years old. At this time, visitors are not allowed in the infusion area.

## 2021-09-03 ENCOUNTER — Other Ambulatory Visit: Payer: Self-pay

## 2021-09-03 ENCOUNTER — Telehealth (HOSPITAL_BASED_OUTPATIENT_CLINIC_OR_DEPARTMENT_OTHER): Payer: Self-pay

## 2021-09-09 ENCOUNTER — Telehealth: Payer: Self-pay | Admitting: Internal Medicine

## 2021-09-09 ENCOUNTER — Other Ambulatory Visit: Payer: Self-pay | Admitting: Internal Medicine

## 2021-09-09 NOTE — Telephone Encounter (Signed)
Per 9/5 secure chat with Nurse called and scheduled pt   pt sister confirmed appointment

## 2021-09-10 ENCOUNTER — Other Ambulatory Visit: Payer: Self-pay

## 2021-09-11 ENCOUNTER — Other Ambulatory Visit: Payer: Self-pay

## 2021-09-12 ENCOUNTER — Other Ambulatory Visit: Payer: Self-pay

## 2021-09-22 ENCOUNTER — Other Ambulatory Visit: Payer: Self-pay | Admitting: *Deleted

## 2021-09-22 DIAGNOSIS — C719 Malignant neoplasm of brain, unspecified: Secondary | ICD-10-CM

## 2021-09-23 ENCOUNTER — Inpatient Hospital Stay: Payer: Medicare Other

## 2021-09-23 ENCOUNTER — Other Ambulatory Visit: Payer: Self-pay

## 2021-09-23 ENCOUNTER — Inpatient Hospital Stay: Payer: Medicare Other | Attending: Internal Medicine

## 2021-09-23 ENCOUNTER — Inpatient Hospital Stay (HOSPITAL_BASED_OUTPATIENT_CLINIC_OR_DEPARTMENT_OTHER): Payer: Medicare Other | Admitting: Internal Medicine

## 2021-09-23 VITALS — BP 189/87 | HR 73 | Temp 98.1°F | Resp 15 | Wt 108.4 lb

## 2021-09-23 DIAGNOSIS — E7800 Pure hypercholesterolemia, unspecified: Secondary | ICD-10-CM | POA: Diagnosis not present

## 2021-09-23 DIAGNOSIS — T451X5A Adverse effect of antineoplastic and immunosuppressive drugs, initial encounter: Secondary | ICD-10-CM

## 2021-09-23 DIAGNOSIS — C719 Malignant neoplasm of brain, unspecified: Secondary | ICD-10-CM | POA: Diagnosis not present

## 2021-09-23 DIAGNOSIS — D701 Agranulocytosis secondary to cancer chemotherapy: Secondary | ICD-10-CM | POA: Diagnosis not present

## 2021-09-23 DIAGNOSIS — Z923 Personal history of irradiation: Secondary | ICD-10-CM | POA: Insufficient documentation

## 2021-09-23 DIAGNOSIS — C714 Malignant neoplasm of occipital lobe: Secondary | ICD-10-CM | POA: Diagnosis present

## 2021-09-23 DIAGNOSIS — Z79899 Other long term (current) drug therapy: Secondary | ICD-10-CM | POA: Diagnosis not present

## 2021-09-23 DIAGNOSIS — Z7952 Long term (current) use of systemic steroids: Secondary | ICD-10-CM | POA: Diagnosis not present

## 2021-09-23 DIAGNOSIS — E039 Hypothyroidism, unspecified: Secondary | ICD-10-CM | POA: Insufficient documentation

## 2021-09-23 DIAGNOSIS — Z9221 Personal history of antineoplastic chemotherapy: Secondary | ICD-10-CM | POA: Insufficient documentation

## 2021-09-23 LAB — CBC WITH DIFFERENTIAL (CANCER CENTER ONLY)
Abs Immature Granulocytes: 0.11 10*3/uL — ABNORMAL HIGH (ref 0.00–0.07)
Basophils Absolute: 0 10*3/uL (ref 0.0–0.1)
Basophils Relative: 0 %
Eosinophils Absolute: 0.1 10*3/uL (ref 0.0–0.5)
Eosinophils Relative: 1 %
HCT: 29.9 % — ABNORMAL LOW (ref 36.0–46.0)
Hemoglobin: 10 g/dL — ABNORMAL LOW (ref 12.0–15.0)
Immature Granulocytes: 1 %
Lymphocytes Relative: 9 %
Lymphs Abs: 0.9 10*3/uL (ref 0.7–4.0)
MCH: 33.6 pg (ref 26.0–34.0)
MCHC: 33.4 g/dL (ref 30.0–36.0)
MCV: 100.3 fL — ABNORMAL HIGH (ref 80.0–100.0)
Monocytes Absolute: 0.9 10*3/uL (ref 0.1–1.0)
Monocytes Relative: 9 %
Neutro Abs: 8.2 10*3/uL — ABNORMAL HIGH (ref 1.7–7.7)
Neutrophils Relative %: 80 %
Platelet Count: 175 10*3/uL (ref 150–400)
RBC: 2.98 MIL/uL — ABNORMAL LOW (ref 3.87–5.11)
RDW: 15 % (ref 11.5–15.5)
WBC Count: 10.3 10*3/uL (ref 4.0–10.5)
nRBC: 0.2 % (ref 0.0–0.2)

## 2021-09-23 LAB — CMP (CANCER CENTER ONLY)
ALT: 19 U/L (ref 0–44)
AST: 20 U/L (ref 15–41)
Albumin: 3.5 g/dL (ref 3.5–5.0)
Alkaline Phosphatase: 52 U/L (ref 38–126)
Anion gap: 8 (ref 5–15)
BUN: 31 mg/dL — ABNORMAL HIGH (ref 8–23)
CO2: 25 mmol/L (ref 22–32)
Calcium: 8.4 mg/dL — ABNORMAL LOW (ref 8.9–10.3)
Chloride: 106 mmol/L (ref 98–111)
Creatinine: 1.19 mg/dL — ABNORMAL HIGH (ref 0.44–1.00)
GFR, Estimated: 51 mL/min — ABNORMAL LOW (ref >60)
Glucose, Bld: 80 mg/dL (ref 70–99)
Potassium: 3.9 mmol/L (ref 3.5–5.1)
Sodium: 139 mmol/L (ref 135–145)
Total Bilirubin: 0.4 mg/dL (ref 0.3–1.2)
Total Protein: 5.4 g/dL — ABNORMAL LOW (ref 6.5–8.1)

## 2021-09-23 LAB — TOTAL PROTEIN, URINE DIPSTICK: Protein, ur: >2000 mg/dL — AB

## 2021-09-23 NOTE — Progress Notes (Signed)
[facility] at [place], [place] [address] [phone]   Interval Evaluation  Date of Service: 09/23/21 Patient Name: Jillian Gomez Patient MRN: [id number] Patient DOB: 1955-04-19 Provider: Ventura Sellers, MD  Identifying Statement:  Jillian Gomez is a 66 y.o. female with left occipital glioblastoma   Oncologic History: Oncology History  Glioblastoma with isocitrate dehydrogenase gene wildtype ([place])  12/23/2018 Surgery   Craniotomy, left occipital resection by Dr. Kathyrn Sheriff.  Path is GBM IDH-wt   01/23/2019 - 03/03/2019 Radiation Therapy   IMRT with concurrent Temozolomide 65m/m2   03/28/2019 - 09/04/2019 Chemotherapy   5 cycles of adjuvant 5-day Temozolomide 2029mm2    09/01/2019 Progression   Progression of disease #1   09/19/2019 -  Chemotherapy   Begins second line therapy with oral CCNU 908m2 q6 weeks, concurrent Avastin 55m3m IV q2 weeks   04/30/2020 -  Chemotherapy   Completes 4th cycle of CCNU/Avastin, transitions to Avastin monotherapy due to cytopenias   08/27/2020 -  Chemotherapy   Resumes CCNU+Avastin after periventricular DWI signal change noted on MRI    12/14/2020 Progression   Progression of disease #2   12/19/2020 - 04/01/2021 Chemotherapy   Initiates daily metronomic Temodar 50mg45mwith Avastin 55mg/83m2 weeks   03/30/2021 Progression   Progression of disease #3   04/08/2021 - 08/19/2021 Chemotherapy   Patient is on Treatment Plan : BRAIN GBM [facility] Irinotecan + Bevacizumab D1,15 q 28d     04/08/2021 -  Chemotherapy   Patient is on Treatment Plan : BRAIN GBM [facility] Irinotecan D1,15 + Bevacizumab D1,15 q 28d       Biomarkers:  MGMT Unknown.  IDH 1/2 Wild type.  EGFR Unknown  TERT Unknown   Interval History:  Jillian Gomez today for planned CPT-11 and avastin.  She experienced a fall last week while outside doing yard work.  She fell on her leg and scraped up her left leg, including  sloughing of skin.  Urgent care placed bandage and treated with oral antibiotics.  The leg injury is not described as painful.  Decadron still at 2mg da68m.  Language issues are stable.  No issues at all with weakness, numbness, no gait impairment since prior visit. No seizures or headaches.  H+P (01/09/19) Patient presented to medical attention in early December with ~2 months history of right sided visual impairment.  She describes fuzzy or blurry vision on that side which was progressive over time, leading to at least one fall.  MRI brain demonstrated enhancing left occipital mass; this was subsequently resected by Dr. NundkumKathyrn Sheriff18/20.  She had no issues with surgery and has completed her steroid taper.  Continues on keppra daily but no history of any seizure. She has returned to work with only modest difficutly.  No issues walking or performing ADLs; lives alone but her sister and other family members are nearby.  Medications: Current Outpatient Medications on File Prior to Visit  Medication Sig Dispense Refill   amLODipine (NORVASC) 5 MG tablet Take 1 tablet (5 mg total) by mouth daily. 60 tablet 1   Biotin 1 MG CAPS Take 1 mg by mouth daily.     CALCIUM PO Take 1 tablet by mouth daily.     dexamethasone (DECADRON) 2 MG tablet Take 1 tablet (2 mg total) by mouth daily. 60 tablet 1   doxycycline (VIBRA-TABS) 100 MG tablet Take 1 tablet by mouth daily. (Patient not taking: Reported on 11/05/2020)  gabapentin (NEURONTIN) 300 MG capsule Take 1 capsule (300 mg total) by mouth 2 (two) times daily. 60 capsule 3   levothyroxine (SYNTHROID) 75 MCG tablet Take 75 mcg by mouth daily.     naproxen sodium (ALEVE) 220 MG tablet Take 1 tablet (220 mg total) by mouth daily as needed (pain).     omeprazole (PRILOSEC) 20 MG capsule Take 1 capsule (20 mg total) by mouth daily. 30 capsule 3   ondansetron (ZOFRAN) 8 MG tablet Take 1 tablet (8 mg total) by mouth 2 (two) times daily as needed for nausea or  vomiting. 20 tablet 0   rosuvastatin (CRESTOR) 10 MG tablet Take 5 mg by mouth at bedtime.     sertraline (ZOLOFT) 25 MG tablet Take 1 tablet (25 mg total) by mouth daily. 60 tablet 1   No current facility-administered medications on file prior to visit.    Allergies:  Allergies  Allergen Reactions   Etodolac Other (See Comments)    GI upset   Macrobid [Nitrofurantoin] Rash   Past Medical History:  Past Medical History:  Diagnosis Date   High cholesterol    Hypothyroidism    Joint pain    Thrombocytopenia ([place] [facility]) 02/26/2020   Past Surgical History:  Past Surgical History:  Procedure Laterality Date   APPLICATION OF CRANIAL NAVIGATION Left 12/23/2018   Procedure: APPLICATION OF CRANIAL NAVIGATION;  Surgeon: Consuella Lose, MD;  Location: [facility];  Service: Neurosurgery;  Laterality: Left;  APPLICATION OF CRANIAL NAVIGATION   BREAST BIOPSY Left    COLONOSCOPY     CRANIOTOMY Left 12/23/2018   Procedure: STEREOTACTIC LEFT OCCIPITAL CRANIOTOMY FOR RESECTION OF TUMOR;  Surgeon: Consuella Lose, MD;  Location: [facility];  Service: Neurosurgery;  Laterality: Left;  STEREOTACTIC LEFT OCCIPITAL CRANIOTOMY FOR RESECTION OF TUMOR   LASIK     Social History:  Social History   Socioeconomic History   Marital status: Single    Spouse name: Not on file   Number of children: Not on file   Years of education: Not on file   Highest education level: Not on file  Occupational History   Not on file  Tobacco Use   Smoking status: Never   Smokeless tobacco: Never  Vaping Use   Vaping Use: Never used  Substance and Sexual Activity   Alcohol use: Not Currently   Drug use: Never   Sexual activity: Not on file  Other Topics Concern   Not on file  Social History Narrative   Not on file   Social Determinants of Health   Financial Resource Strain: Not on file  Food Insecurity: Not on file  Transportation Needs: Not on file  Physical Activity: Not on file  Stress: Not on file  Social  Connections: Not on file  Intimate Partner Violence: Not on file   Family History: No family history on file.  Review of Systems: Constitutional: Denies fevers, chills or abnormal weight loss Eyes: Denies blurriness of vision Gastrointestinal:  Denies nausea, constipation, diarrhea GU: Denies dysuria or incontinence Skin: Denies abnormal skin rashes Musculoskeletal: Denies joint pain, back or neck discomfort.  Behavioral/Psych: Denies anxiety, mood instability  Physical Exam: Vitals:   09/23/21 0923  BP: (!) 189/87  Pulse: 73  Resp: 15  Temp: 98.1 F (36.7 C)  SpO2: 100%    KPS: 70. General: Alert, cooperative, pleasant, in no acute distress Head: Craniotomy scar noted, dry and intact. EENT: No conjunctival injection or scleral icterus. Oral mucosa moist Lungs: Resp effort normal Cardiac: Regular  rate and rhythm Abdomen: Soft, non-distended abdomen Skin: Left lower leg wound, >5cm with exposed dermal layer Extremities: edema in left leg  Neurologic Exam: Mental Status: Awake, alert, attentive to examiner. Oriented to self and environment. Language is notable for notable expressive dyshpasia. Cranial Nerves: Visual acuity is grossly normal. Right homonymous hemianopia. Extra-ocular movements intact. No ptosis. Face is symmetric, tongue midline. Motor: Tone and bulk are normal. Power is full in both arms and legs. Reflexes are symmetric, no pathologic reflexes present. Intact finger to nose bilaterally Sensory: Intact to light touch and temperature Gait: Normal and tandem gait is deferred.   Labs: I have reviewed the data as listed    Component Value Date/Time   NA 139 09/23/2021 0854   K 3.9 09/23/2021 0854   CL 106 09/23/2021 0854   CO2 25 09/23/2021 0854   GLUCOSE 80 09/23/2021 0854   BUN 31 (H) 09/23/2021 0854   CREATININE 1.19 (H) 09/23/2021 0854   CALCIUM 8.4 (L) 09/23/2021 0854   PROT 5.4 (L) 09/23/2021 0854   ALBUMIN 3.5 09/23/2021 0854   AST 20  09/23/2021 0854   ALT 19 09/23/2021 0854   ALKPHOS 52 09/23/2021 0854   BILITOT 0.4 09/23/2021 0854   GFRNONAA 51 (L) 09/23/2021 0854   GFRAA >60 10/03/2019 1133   Lab Results  Component Value Date   WBC 10.3 09/23/2021   NEUTROABS 8.2 (H) 09/23/2021   HGB 10.0 (L) 09/23/2021   HCT 29.9 (L) 09/23/2021   MCV 100.3 (H) 09/23/2021   PLT 175 09/23/2021     Assessment/Plan Glioblastoma, IDH-1 WT  Jillian Gomez is clinically stable today from neurologic standpoint.    We will defer chemotherapy and avastin today due to open wound on right leg.  Avastin unfortunately is not yet washed out from last infusion.    Dr. Werner Lean and nursing team evaluated and re-dressed the wound today.  Referral will be placed for wound care to follow the injury and dressing.  Urine protein is also elevated today.  Once therapy resumes, potentially in 14 days, Avastin will be dose reduced today to 7.59m/kg.  Irinotecan will con't at 1214mm2 as prior.  Chemotherapy should be held for the following:  ANC less than 1,000  Platelets less than 100,000  LFT or creatinine greater than 2x ULN  If clinical concerns/contraindications develop  Avastin should be held for the following:   ANC less than 500  Platelets less than 50,000  LFT or creatinine greater than 2x ULN  If clinical concerns/contraindications develop  Con't norvasc to 52m152maily.  Decadron may con't at 2mg72mily.  We ask that Jillian MYHREurn to clinic in 14 days for next cycle of cpt-11/avastin, pending labs, etc.  All questions were answered. The patient knows to call the clinic with any problems, questions or concerns. No barriers to learning were detected.  I have spent a total of 30 minutes of face-to-face and non-face-to-face time, excluding clinical staff time, preparing to see patient, ordering tests and/or medications, counseling the patient, and independently interpreting results and communicating results to  the patient/family/caregiver    ZachVentura Sellers Medical Director of Neuro-Oncology [facility]19/23 9:16 AM

## 2021-09-23 NOTE — Progress Notes (Signed)
Patient fell this past weekend and endured a skin tear to the right lower leg starting below the right knee down three quarters of the way down towards the ankle.  She went to urgent care where they applied a clear tegaderm and prescribed cedadroxil 500 mg (total dose of 1000 mg BID) until completed.  Upon inspection today see that wound is weaping and in fact is a large skin tear with hematoma. Tegaderm was reinforced by family.  Consulted [facility] Practitioner and another inpatient WOC, RN.  Was instructed to remove tegaderm, cleanse with normal saline, apply vasoline gauze and nonadherent dressing followed by Kerlix.    She also advised if patient can find some half strength Dakins solution to clean the wound  with every day for the first week then every other day. Will probably have to order off of [facility] if can't find in medical supply store.   This education was provided to sister Jillian Gomez) and patient.  Will reassess wound when patient returns to office in 2 weeks.    Wound measured 20 cm x 9 cm at widest point.    Wound care referral placed.

## 2021-09-24 ENCOUNTER — Telehealth: Payer: Self-pay

## 2021-09-24 NOTE — Telephone Encounter (Signed)
Sister, [name], called requesting wound referral be sent to [facility]. Referral documentation faxed to [phone]. FAX confirmation received. Called [name] to advise that referral had been sent.

## 2021-09-24 NOTE — Telephone Encounter (Signed)
Spoke with sister, Loletta Parish, advising that patient's MRI needs to be scheduled with expected date of 10/24/21. Provided number to central scheduling. Sister will call to schedule. Encouraged call back to clinic with questions or concerns.

## 2021-09-25 ENCOUNTER — Other Ambulatory Visit: Payer: Self-pay

## 2021-10-02 ENCOUNTER — Encounter (HOSPITAL_BASED_OUTPATIENT_CLINIC_OR_DEPARTMENT_OTHER): Payer: Medicare Other | Admitting: General Surgery

## 2021-10-02 ENCOUNTER — Telehealth: Payer: Self-pay | Admitting: *Deleted

## 2021-10-02 NOTE — Telephone Encounter (Signed)
Patients sister Jeannene Patella was seen in person with the following question.  She is curious if patient should change her steroid dose as her confusion has gotten worse in the past couple of days.  She also wanted to report that patient had dropped something on the same right leg along the shin bone and caused a further wound/injury to the area.  Wound clinic has evaulated all wounds and treating.    Spoke with Dr Mickeal Skinner and he encouraged her to dose increase her Decadron to 4 mg once daily as the Avastin has been out of her system which would contribute the increase in confusion.   He will readdress next week at [place] visit.  Sister [name] expressed understanding to the instructions and denied having any further questions at this time.

## 2021-10-04 ENCOUNTER — Other Ambulatory Visit: Payer: Self-pay | Admitting: Internal Medicine

## 2021-10-06 ENCOUNTER — Other Ambulatory Visit: Payer: Self-pay | Admitting: *Deleted

## 2021-10-06 ENCOUNTER — Encounter: Payer: Self-pay | Admitting: Internal Medicine

## 2021-10-06 ENCOUNTER — Other Ambulatory Visit: Payer: Self-pay

## 2021-10-06 ENCOUNTER — Other Ambulatory Visit: Payer: Self-pay | Admitting: Internal Medicine

## 2021-10-06 DIAGNOSIS — C719 Malignant neoplasm of brain, unspecified: Secondary | ICD-10-CM

## 2021-10-07 ENCOUNTER — Other Ambulatory Visit: Payer: Self-pay

## 2021-10-07 ENCOUNTER — Inpatient Hospital Stay: Payer: Medicare Other

## 2021-10-07 ENCOUNTER — Inpatient Hospital Stay: Payer: Medicare Other | Attending: Internal Medicine

## 2021-10-07 ENCOUNTER — Inpatient Hospital Stay (HOSPITAL_BASED_OUTPATIENT_CLINIC_OR_DEPARTMENT_OTHER): Payer: Medicare Other | Admitting: Internal Medicine

## 2021-10-07 VITALS — BP 182/82 | HR 82 | Temp 97.8°F | Resp 18 | Ht 62.0 in | Wt 111.0 lb

## 2021-10-07 DIAGNOSIS — C719 Malignant neoplasm of brain, unspecified: Secondary | ICD-10-CM

## 2021-10-07 DIAGNOSIS — C714 Malignant neoplasm of occipital lobe: Secondary | ICD-10-CM | POA: Insufficient documentation

## 2021-10-07 DIAGNOSIS — R4189 Other symptoms and signs involving cognitive functions and awareness: Secondary | ICD-10-CM | POA: Insufficient documentation

## 2021-10-07 DIAGNOSIS — R801 Persistent proteinuria, unspecified: Secondary | ICD-10-CM

## 2021-10-07 DIAGNOSIS — E039 Hypothyroidism, unspecified: Secondary | ICD-10-CM | POA: Insufficient documentation

## 2021-10-07 DIAGNOSIS — Z79899 Other long term (current) drug therapy: Secondary | ICD-10-CM | POA: Insufficient documentation

## 2021-10-07 DIAGNOSIS — T451X5A Adverse effect of antineoplastic and immunosuppressive drugs, initial encounter: Secondary | ICD-10-CM

## 2021-10-07 DIAGNOSIS — D701 Agranulocytosis secondary to cancer chemotherapy: Secondary | ICD-10-CM

## 2021-10-07 DIAGNOSIS — Z7952 Long term (current) use of systemic steroids: Secondary | ICD-10-CM | POA: Diagnosis not present

## 2021-10-07 LAB — CBC WITH DIFFERENTIAL (CANCER CENTER ONLY)
Abs Immature Granulocytes: 0.17 10*3/uL — ABNORMAL HIGH (ref 0.00–0.07)
Basophils Absolute: 0 10*3/uL (ref 0.0–0.1)
Basophils Relative: 0 %
Eosinophils Absolute: 0 10*3/uL (ref 0.0–0.5)
Eosinophils Relative: 0 %
HCT: 30.7 % — ABNORMAL LOW (ref 36.0–46.0)
Hemoglobin: 10.4 g/dL — ABNORMAL LOW (ref 12.0–15.0)
Immature Granulocytes: 1 %
Lymphocytes Relative: 4 %
Lymphs Abs: 0.7 10*3/uL (ref 0.7–4.0)
MCH: 33.7 pg (ref 26.0–34.0)
MCHC: 33.9 g/dL (ref 30.0–36.0)
MCV: 99.4 fL (ref 80.0–100.0)
Monocytes Absolute: 0.5 10*3/uL (ref 0.1–1.0)
Monocytes Relative: 3 %
Neutro Abs: 15.2 10*3/uL — ABNORMAL HIGH (ref 1.7–7.7)
Neutrophils Relative %: 92 %
Platelet Count: 186 10*3/uL (ref 150–400)
RBC: 3.09 MIL/uL — ABNORMAL LOW (ref 3.87–5.11)
RDW: 14.6 % (ref 11.5–15.5)
WBC Count: 16.6 10*3/uL — ABNORMAL HIGH (ref 4.0–10.5)
nRBC: 0 % (ref 0.0–0.2)

## 2021-10-07 LAB — CMP (CANCER CENTER ONLY)
ALT: 19 U/L (ref 0–44)
AST: 17 U/L (ref 15–41)
Albumin: 3.4 g/dL — ABNORMAL LOW (ref 3.5–5.0)
Alkaline Phosphatase: 57 U/L (ref 38–126)
Anion gap: 8 (ref 5–15)
BUN: 36 mg/dL — ABNORMAL HIGH (ref 8–23)
CO2: 24 mmol/L (ref 22–32)
Calcium: 8.6 mg/dL — ABNORMAL LOW (ref 8.9–10.3)
Chloride: 107 mmol/L (ref 98–111)
Creatinine: 1.19 mg/dL — ABNORMAL HIGH (ref 0.44–1.00)
GFR, Estimated: 51 mL/min — ABNORMAL LOW (ref >60)
Glucose, Bld: 159 mg/dL — ABNORMAL HIGH (ref 70–99)
Potassium: 4.6 mmol/L (ref 3.5–5.1)
Sodium: 139 mmol/L (ref 135–145)
Total Bilirubin: 0.4 mg/dL (ref 0.3–1.2)
Total Protein: 5.1 g/dL — ABNORMAL LOW (ref 6.5–8.1)

## 2021-10-07 LAB — TOTAL PROTEIN, URINE DIPSTICK: Protein, ur: >2000 mg/dL — AB

## 2021-10-07 NOTE — Progress Notes (Signed)
[facility] at [place], [place] [address] [phone]   Interval Evaluation  Date of Service: 10/07/21 Patient Name: Jillian Gomez Patient MRN: [id number] Patient DOB: 1955-07-04 Provider: Ventura Sellers, MD  Identifying Statement:  Jillian Gomez is a 66 y.o. female with left occipital glioblastoma   Oncologic History: Oncology History  Glioblastoma with isocitrate dehydrogenase gene wildtype ([place])  12/23/2018 Surgery   Craniotomy, left occipital resection by Dr. Kathyrn Sheriff.  Path is GBM IDH-wt   01/23/2019 - 03/03/2019 Radiation Therapy   IMRT with concurrent Temozolomide 20m/m2   03/28/2019 - 09/04/2019 Chemotherapy   5 cycles of adjuvant 5-day Temozolomide 2042mm2    09/01/2019 Progression   Progression of disease #1   09/19/2019 -  Chemotherapy   Begins second line therapy with oral CCNU 9051m2 q6 weeks, concurrent Avastin 51m42m IV q2 weeks   04/30/2020 -  Chemotherapy   Completes 4th cycle of CCNU/Avastin, transitions to Avastin monotherapy due to cytopenias   08/27/2020 -  Chemotherapy   Resumes CCNU+Avastin after periventricular DWI signal change noted on MRI    12/14/2020 Progression   Progression of disease #2   12/19/2020 - 04/01/2021 Chemotherapy   Initiates daily metronomic Temodar 50mg70mwith Avastin 51mg/54m2 weeks   03/30/2021 Progression   Progression of disease #3   04/08/2021 - 08/19/2021 Chemotherapy   Patient is on Treatment Plan : BRAIN GBM [facility] Irinotecan + Bevacizumab D1,15 q 28d     04/08/2021 -  Chemotherapy   Patient is on Treatment Plan : BRAIN GBM [facility] Irinotecan D1,15 + Bevacizumab D1,15 q 28d       Biomarkers:  MGMT Unknown.  IDH 1/2 Wild type.  EGFR Unknown  TERT Unknown   Interval History:  Jillian Gomez today for planned CPT-11 and avastin.  Leg injury has improved locally, but she now has 2 additional areas of severe skin breakdown, one on the left leg and  another on the left arm.  She has been seeing wound care nurse in [place] 4mg da54m, this has not helped at all unfortunately.  Language deficits are worse from prior, confusion level is increased.  No issues at all with weakness, numbness, no gait impairment since prior visit. No seizures or headaches.  H+P (01/09/19) Patient presented to medical attention in early December with ~2 months history of right sided visual impairment.  She describes fuzzy or blurry vision on that side which was progressive over time, leading to at least one fall.  MRI brain demonstrated enhancing left occipital mass; this was subsequently resected by Dr. NundkumKathyrn Sheriff18/20.  She had no issues with surgery and has completed her steroid taper.  Continues on keppra daily but no history of any seizure. She has returned to work with only modest difficutly.  No issues walking or performing ADLs; lives alone but her sister and other family members are nearby.  Medications: Current Outpatient Medications on File Prior to Visit  Medication Sig Dispense Refill   amLODipine (NORVASC) 5 MG tablet Take 1 tablet (5 mg total) by mouth daily. 60 tablet 1   Biotin 1 MG CAPS Take 1 mg by mouth daily.     CALCIUM PO Take 1 tablet by mouth daily.     cefadroxil (DURICEF) 500 MG capsule Take 2 capsules by mouth 2 (two) times daily.     dexamethasone (DECADRON) 2 MG tablet Take 1 tablet (2 mg total) by mouth daily. 60 tablet  1   dexamethasone (DECADRON) 4 MG tablet TAKE 1 TABLET(4 MG) BY MOUTH DAILY 30 tablet 1   doxycycline (VIBRA-TABS) 100 MG tablet Take 1 tablet by mouth daily. (Patient not taking: Reported on 11/05/2020)     gabapentin (NEURONTIN) 300 MG capsule Take 1 capsule (300 mg total) by mouth 2 (two) times daily. 60 capsule 3   levothyroxine (SYNTHROID) 75 MCG tablet Take 75 mcg by mouth daily.     naproxen sodium (ALEVE) 220 MG tablet Take 1 tablet (220 mg total) by mouth daily as needed (pain).     omeprazole  (PRILOSEC) 20 MG capsule Take 1 capsule (20 mg total) by mouth daily. 30 capsule 3   ondansetron (ZOFRAN) 8 MG tablet Take 1 tablet (8 mg total) by mouth 2 (two) times daily as needed for nausea or vomiting. 20 tablet 0   rosuvastatin (CRESTOR) 10 MG tablet Take 5 mg by mouth at bedtime.     sertraline (ZOLOFT) 25 MG tablet Take 1 tablet (25 mg total) by mouth daily. 60 tablet 1   No current facility-administered medications on file prior to visit.    Allergies:  Allergies  Allergen Reactions   Etodolac Other (See Comments)    GI upset   Macrobid [Nitrofurantoin] Rash   Past Medical History:  Past Medical History:  Diagnosis Date   High cholesterol    Hypothyroidism    Joint pain    Thrombocytopenia ([place]) 02/26/2020   Past Surgical History:  Past Surgical History:  Procedure Laterality Date   APPLICATION OF CRANIAL NAVIGATION Left 12/23/2018   Procedure: APPLICATION OF CRANIAL NAVIGATION;  Surgeon: Consuella Lose, MD;  Location: [facility];  Service: Neurosurgery;  Laterality: Left;  APPLICATION OF CRANIAL NAVIGATION   BREAST BIOPSY Left    COLONOSCOPY     CRANIOTOMY Left 12/23/2018   Procedure: STEREOTACTIC LEFT OCCIPITAL CRANIOTOMY FOR RESECTION OF TUMOR;  Surgeon: Consuella Lose, MD;  Location: [facility];  Service: Neurosurgery;  Laterality: Left;  STEREOTACTIC LEFT OCCIPITAL CRANIOTOMY FOR RESECTION OF TUMOR   LASIK     Social History:  Social History   Socioeconomic History   Marital status: Single    Spouse name: Not on file   Number of children: Not on file   Years of education: Not on file   Highest education level: Not on file  Occupational History   Not on file  Tobacco Use   Smoking status: Never   Smokeless tobacco: Never  Vaping Use   Vaping Use: Never used  Substance and Sexual Activity   Alcohol use: Not Currently   Drug use: Never   Sexual activity: Not on file  Other Topics Concern   Not on file  Social History Narrative   Not on file   Social  Determinants of Health   Financial Resource Strain: Not on file  Food Insecurity: Not on file  Transportation Needs: Not on file  Physical Activity: Not on file  Stress: Not on file  Social Connections: Not on file  Intimate Partner Violence: Not on file   Family History: No family history on file.  Review of Systems: Constitutional: Denies fevers, chills or abnormal weight loss Eyes: Denies blurriness of vision Gastrointestinal:  Denies nausea, constipation, diarrhea GU: Denies dysuria or incontinence Skin: Denies abnormal skin rashes Musculoskeletal: Denies joint pain, back or neck discomfort.  Behavioral/Psych: Denies anxiety, mood instability  Physical Exam: Vitals:   10/07/21 1243  BP: (!) 182/82  Pulse: 82  Resp: 18  Temp: 97.8 F (  36.6 C)  SpO2: 100%    KPS: 70. General: Alert, cooperative, pleasant, in no acute distress Head: Craniotomy scar noted, dry and intact. EENT: No conjunctival injection or scleral icterus. Oral mucosa moist Lungs: Resp effort normal Cardiac: Regular rate and rhythm Abdomen: Soft, non-distended abdomen Skin: Left lower leg wound, >5cm with exposed dermal layer Extremities: edema in left leg  Neurologic Exam: Mental Status: Awake, alert, attentive to examiner. Oriented to self and environment. Language is notable for notable expressive dyshpasia. Cranial Nerves: Visual acuity is grossly normal. Right homonymous hemianopia. Extra-ocular movements intact. No ptosis. Face is symmetric, tongue midline. Motor: Tone and bulk are normal. Power is full in both arms and legs. Reflexes are symmetric, no pathologic reflexes present. Intact finger to nose bilaterally Sensory: Intact to light touch and temperature Gait: Normal and tandem gait is deferred.   Labs: I have reviewed the data as listed    Component Value Date/Time   NA 139 09/23/2021 0854   K 3.9 09/23/2021 0854   CL 106 09/23/2021 0854   CO2 25 09/23/2021 0854   GLUCOSE 80  09/23/2021 0854   BUN 31 (H) 09/23/2021 0854   CREATININE 1.19 (H) 09/23/2021 0854   CALCIUM 8.4 (L) 09/23/2021 0854   PROT 5.4 (L) 09/23/2021 0854   ALBUMIN 3.5 09/23/2021 0854   AST 20 09/23/2021 0854   ALT 19 09/23/2021 0854   ALKPHOS 52 09/23/2021 0854   BILITOT 0.4 09/23/2021 0854   GFRNONAA 51 (L) 09/23/2021 0854   GFRAA >60 10/03/2019 1133   Lab Results  Component Value Date   WBC 16.6 (H) 10/07/2021   NEUTROABS 15.2 (H) 10/07/2021   HGB 10.4 (L) 10/07/2021   HCT 30.7 (L) 10/07/2021   MCV 99.4 10/07/2021   PLT 186 10/07/2021     Assessment/Plan Glioblastoma, IDH-1 WT  Jillian Gomez is clinically progressive today unfortunately.  Language deficits and cognitive issues have progressed.    We will again defer chemotherapy and avastin today due to open wound on left leg and arm, in addition to proteinuria.  It is likely secondary to skin breakdown from corticosteroids, as avastin washed out ~2 weeks ago.   We will assess 24h urine protein to check for nephropathy grade proteinuria.  If 24h level >3.5g avastin will be discontinued.  Also recommending ASAP MRI brain given clinical decline, suspected failure of systemic therapy.  Con't norvasc to 68m daily.  Decadron should decrease to 268mdaily x5 days, then 73m12maily x5 days, then STOP if tolerated.  We ask that Jillian KUCHturn to clinic in 1 week to review MRI, lab results, discuss treatment plan.  All questions were answered. The patient knows to call the clinic with any problems, questions or concerns. No barriers to learning were detected.  I have spent a total of 40 minutes of face-to-face and non-face-to-face time, excluding clinical staff time, preparing to see patient, ordering tests and/or medications, counseling the patient, and independently interpreting results and communicating results to the patient/family/caregiver    ZacVentura SellersD Medical Director of Neuro-Oncology [facility] [place]/03/23 12:47 PM

## 2021-10-08 ENCOUNTER — Ambulatory Visit (HOSPITAL_COMMUNITY)
Admission: RE | Admit: 2021-10-08 | Discharge: 2021-10-08 | Disposition: A | Discharge status: Home / Self Care | Payer: Medicare Other | Source: Ambulatory Visit | Attending: Internal Medicine | Admitting: Internal Medicine

## 2021-10-08 DIAGNOSIS — C719 Malignant neoplasm of brain, unspecified: Secondary | ICD-10-CM | POA: Diagnosis present

## 2021-10-08 MED ORDER — GADOPICLENOL 0.5 MMOL/ML IV SOLN
5.0000 mL | Freq: Once | INTRAVENOUS | Status: AC | PRN
  Administered 2021-10-08: 5 mL via INTRAVENOUS

## 2021-10-09 ENCOUNTER — Other Ambulatory Visit: Payer: Self-pay | Admitting: *Deleted

## 2021-10-09 ENCOUNTER — Other Ambulatory Visit: Payer: Self-pay

## 2021-10-09 DIAGNOSIS — C719 Malignant neoplasm of brain, unspecified: Secondary | ICD-10-CM

## 2021-10-09 LAB — PROTEIN, URINE, 24 HOUR
Collection Interval-UPROT: 24 hours
Protein, Urine: >3000 mg/dL

## 2021-10-14 ENCOUNTER — Inpatient Hospital Stay (HOSPITAL_BASED_OUTPATIENT_CLINIC_OR_DEPARTMENT_OTHER): Payer: Medicare Other | Admitting: Internal Medicine

## 2021-10-14 ENCOUNTER — Other Ambulatory Visit: Payer: Self-pay

## 2021-10-14 ENCOUNTER — Inpatient Hospital Stay: Payer: Medicare Other

## 2021-10-14 VITALS — BP 175/87 | HR 99 | Temp 98.1°F | Resp 15 | Wt 111.4 lb

## 2021-10-14 DIAGNOSIS — D701 Agranulocytosis secondary to cancer chemotherapy: Secondary | ICD-10-CM | POA: Diagnosis not present

## 2021-10-14 DIAGNOSIS — T451X5A Adverse effect of antineoplastic and immunosuppressive drugs, initial encounter: Secondary | ICD-10-CM

## 2021-10-14 DIAGNOSIS — M7989 Other specified soft tissue disorders: Secondary | ICD-10-CM

## 2021-10-14 DIAGNOSIS — C714 Malignant neoplasm of occipital lobe: Secondary | ICD-10-CM | POA: Diagnosis not present

## 2021-10-14 DIAGNOSIS — C719 Malignant neoplasm of brain, unspecified: Secondary | ICD-10-CM

## 2021-10-14 DIAGNOSIS — Z7189 Other specified counseling: Secondary | ICD-10-CM | POA: Diagnosis not present

## 2021-10-14 LAB — CMP (CANCER CENTER ONLY)
ALT: 19 U/L (ref 0–44)
AST: 15 U/L (ref 15–41)
Albumin: 3.3 g/dL — ABNORMAL LOW (ref 3.5–5.0)
Alkaline Phosphatase: 61 U/L (ref 38–126)
Anion gap: 6 (ref 5–15)
BUN: 34 mg/dL — ABNORMAL HIGH (ref 8–23)
CO2: 27 mmol/L (ref 22–32)
Calcium: 8.4 mg/dL — ABNORMAL LOW (ref 8.9–10.3)
Chloride: 107 mmol/L (ref 98–111)
Creatinine: 1.19 mg/dL — ABNORMAL HIGH (ref 0.44–1.00)
GFR, Estimated: 51 mL/min — ABNORMAL LOW (ref >60)
Glucose, Bld: 152 mg/dL — ABNORMAL HIGH (ref 70–99)
Potassium: 3.8 mmol/L (ref 3.5–5.1)
Sodium: 140 mmol/L (ref 135–145)
Total Bilirubin: 0.3 mg/dL (ref 0.3–1.2)
Total Protein: 5.5 g/dL — ABNORMAL LOW (ref 6.5–8.1)

## 2021-10-14 LAB — CBC WITH DIFFERENTIAL (CANCER CENTER ONLY)
Abs Immature Granulocytes: 0.1 10*3/uL — ABNORMAL HIGH (ref 0.00–0.07)
Basophils Absolute: 0 10*3/uL (ref 0.0–0.1)
Basophils Relative: 0 %
Eosinophils Absolute: 0.1 10*3/uL (ref 0.0–0.5)
Eosinophils Relative: 1 %
HCT: 30.6 % — ABNORMAL LOW (ref 36.0–46.0)
Hemoglobin: 10.4 g/dL — ABNORMAL LOW (ref 12.0–15.0)
Immature Granulocytes: 1 %
Lymphocytes Relative: 7 %
Lymphs Abs: 0.9 10*3/uL (ref 0.7–4.0)
MCH: 33.1 pg (ref 26.0–34.0)
MCHC: 34 g/dL (ref 30.0–36.0)
MCV: 97.5 fL (ref 80.0–100.0)
Monocytes Absolute: 0.8 10*3/uL (ref 0.1–1.0)
Monocytes Relative: 7 %
Neutro Abs: 10.1 10*3/uL — ABNORMAL HIGH (ref 1.7–7.7)
Neutrophils Relative %: 84 %
Platelet Count: 192 10*3/uL (ref 150–400)
RBC: 3.14 MIL/uL — ABNORMAL LOW (ref 3.87–5.11)
RDW: 14.4 % (ref 11.5–15.5)
WBC Count: 12 10*3/uL — ABNORMAL HIGH (ref 4.0–10.5)
nRBC: 0 % (ref 0.0–0.2)

## 2021-10-14 LAB — TOTAL PROTEIN, URINE DIPSTICK: Protein, ur: >2000 mg/dL — AB

## 2021-10-14 NOTE — Progress Notes (Signed)
[facility] at [place], [place] [address] [phone]   Interval Evaluation  Date of Service: 10/14/21 Patient Name: Jillian Gomez Patient MRN: [id number] Patient DOB: 02-15-1955 Provider: Ventura Sellers, MD  Identifying Statement:  Jillian Gomez is a 66 y.o. female with left occipital glioblastoma   Oncologic History: Oncology History  Glioblastoma with isocitrate dehydrogenase gene wildtype ([place])  12/23/2018 Surgery   Craniotomy, left occipital resection by Dr. Kathyrn Sheriff.  Path is GBM IDH-wt   01/23/2019 - 03/03/2019 Radiation Therapy   IMRT with concurrent Temozolomide 19m/m2   03/28/2019 - 09/04/2019 Chemotherapy   5 cycles of adjuvant 5-day Temozolomide 2067mm2    09/01/2019 Progression   Progression of disease #1   09/19/2019 -  Chemotherapy   Begins second line therapy with oral CCNU 901m2 q6 weeks, concurrent Avastin 74m42m IV q2 weeks   04/30/2020 -  Chemotherapy   Completes 4th cycle of CCNU/Avastin, transitions to Avastin monotherapy due to cytopenias   08/27/2020 -  Chemotherapy   Resumes CCNU+Avastin after periventricular DWI signal change noted on MRI    12/14/2020 Progression   Progression of disease #2   12/19/2020 - 04/01/2021 Chemotherapy   Initiates daily metronomic Temodar 50mg65mwith Avastin 74mg/28m2 weeks   03/30/2021 Progression   Progression of disease #3   04/08/2021 - 08/19/2021 Chemotherapy   Patient is on Treatment Plan : BRAIN GBM [facility] Irinotecan + Bevacizumab D1,15 q 28d     04/08/2021 -  Chemotherapy   Patient is on Treatment Plan : BRAIN GBM [facility] Irinotecan D1,15 + Bevacizumab D1,15 q 28d       Biomarkers:  MGMT Unknown.  IDH 1/2 Wild type.  EGFR Unknown  TERT Unknown   Interval History:  Jillian JAIELLE DLOUHYnts today following recent MRI brain.  Regions of skin breakdown have persisted on both left arm and leg, not worse from prior.  Language and confusion issues are a  little better today.  Left lower leg is now very swollen, however. No issues at all with weakness, numbness, no gait impairment since prior visit. No seizures or headaches.  H+P (01/09/19) Patient presented to medical attention in early December with ~2 months history of right sided visual impairment.  She describes fuzzy or blurry vision on that side which was progressive over time, leading to at least one fall.  MRI brain demonstrated enhancing left occipital mass; this was subsequently resected by Dr. NundkuKathyrn Sheriff/18/20.  She had no issues with surgery and has completed her steroid taper.  Continues on keppra daily but no history of any seizure. She has returned to work with only modest difficutly.  No issues walking or performing ADLs; lives alone but her sister and other family members are nearby.  Medications: Current Outpatient Medications on File Prior to Visit  Medication Sig Dispense Refill   amLODipine (NORVASC) 5 MG tablet Take 1 tablet (5 mg total) by mouth daily. 60 tablet 1   Biotin 1 MG CAPS Take 1 mg by mouth daily.     CALCIUM PO Take 1 tablet by mouth daily.     cefadroxil (DURICEF) 500 MG capsule Take 2 capsules by mouth 2 (two) times daily.     dexamethasone (DECADRON) 2 MG tablet Take 1 tablet (2 mg total) by mouth daily. 60 tablet 1   dexamethasone (DECADRON) 4 MG tablet TAKE 1 TABLET(4 MG) BY MOUTH DAILY 30 tablet 1   doxycycline (VIBRA-TABS) 100 MG tablet Take  1 tablet by mouth daily. (Patient not taking: Reported on 11/05/2020)     gabapentin (NEURONTIN) 300 MG capsule Take 1 capsule (300 mg total) by mouth 2 (two) times daily. 60 capsule 3   levothyroxine (SYNTHROID) 75 MCG tablet Take 75 mcg by mouth daily.     naproxen sodium (ALEVE) 220 MG tablet Take 1 tablet (220 mg total) by mouth daily as needed (pain).     omeprazole (PRILOSEC) 20 MG capsule Take 1 capsule (20 mg total) by mouth daily. 30 capsule 3   ondansetron (ZOFRAN) 8 MG tablet Take 1 tablet (8 mg total) by  mouth 2 (two) times daily as needed for nausea or vomiting. 20 tablet 0   rosuvastatin (CRESTOR) 10 MG tablet Take 5 mg by mouth at bedtime.     sertraline (ZOLOFT) 25 MG tablet Take 1 tablet (25 mg total) by mouth daily. 60 tablet 1   No current facility-administered medications on file prior to visit.    Allergies:  Allergies  Allergen Reactions   Etodolac Other (See Comments)    GI upset   Macrobid [Nitrofurantoin] Rash   Past Medical History:  Past Medical History:  Diagnosis Date   High cholesterol    Hypothyroidism    Joint pain    Thrombocytopenia ([place]) 02/26/2020   Past Surgical History:  Past Surgical History:  Procedure Laterality Date   APPLICATION OF CRANIAL NAVIGATION Left 12/23/2018   Procedure: APPLICATION OF CRANIAL NAVIGATION;  Surgeon: Consuella Lose, MD;  Location: [facility];  Service: Neurosurgery;  Laterality: Left;  APPLICATION OF CRANIAL NAVIGATION   BREAST BIOPSY Left    COLONOSCOPY     CRANIOTOMY Left 12/23/2018   Procedure: STEREOTACTIC LEFT OCCIPITAL CRANIOTOMY FOR RESECTION OF TUMOR;  Surgeon: Consuella Lose, MD;  Location: [facility];  Service: Neurosurgery;  Laterality: Left;  STEREOTACTIC LEFT OCCIPITAL CRANIOTOMY FOR RESECTION OF TUMOR   LASIK     Social History:  Social History   Socioeconomic History   Marital status: Single    Spouse name: Not on file   Number of children: Not on file   Years of education: Not on file   Highest education level: Not on file  Occupational History   Not on file  Tobacco Use   Smoking status: Never   Smokeless tobacco: Never  Vaping Use   Vaping Use: Never used  Substance and Sexual Activity   Alcohol use: Not Currently   Drug use: Never   Sexual activity: Not on file  Other Topics Concern   Not on file  Social History Narrative   Not on file   Social Determinants of Health   Financial Resource Strain: Not on file  Food Insecurity: Not on file  Transportation Needs: Not on file  Physical  Activity: Not on file  Stress: Not on file  Social Connections: Not on file  Intimate Partner Violence: Not on file   Family History: No family history on file.  Review of Systems: Constitutional: Denies fevers, chills or abnormal weight loss Eyes: Denies blurriness of vision Gastrointestinal:  Denies nausea, constipation, diarrhea GU: Denies dysuria or incontinence Skin: Denies abnormal skin rashes Musculoskeletal: Denies joint pain, back or neck discomfort.  Behavioral/Psych: Denies anxiety, mood instability  Physical Exam: Vitals:   10/14/21 1214  BP: (!) 175/87  Pulse: 99  Resp: 15  Temp: 98.1 F (36.7 C)  SpO2: 100%   KPS: 70. General: Alert, cooperative, pleasant, in no acute distress Head: Craniotomy scar noted, dry and intact. EENT: No  conjunctival injection or scleral icterus. Oral mucosa moist Lungs: Resp effort normal Cardiac: Regular rate and rhythm Abdomen: Soft, non-distended abdomen Skin: Left lower leg wound, >5cm with exposed dermal layer Extremities: severe pitting edema left lower leg  Neurologic Exam: Mental Status: Awake, alert, attentive to examiner. Oriented to self and environment. Language is notable for notable expressive dyshpasia. Cranial Nerves: Visual acuity is grossly normal. Right homonymous hemianopia. Extra-ocular movements intact. No ptosis. Face is symmetric, tongue midline. Motor: Tone and bulk are normal. Power is full in both arms and legs. Reflexes are symmetric, no pathologic reflexes present. Intact finger to nose bilaterally Sensory: Intact to light touch and temperature Gait: Normal and tandem gait is deferred.   Labs: I have reviewed the data as listed    Component Value Date/Time   NA 139 10/07/2021 1214   K 4.6 10/07/2021 1214   CL 107 10/07/2021 1214   CO2 24 10/07/2021 1214   GLUCOSE 159 (H) 10/07/2021 1214   BUN 36 (H) 10/07/2021 1214   CREATININE 1.19 (H) 10/07/2021 1214   CALCIUM 8.6 (L) 10/07/2021 1214   PROT  5.1 (L) 10/07/2021 1214   ALBUMIN 3.4 (L) 10/07/2021 1214   AST 17 10/07/2021 1214   ALT 19 10/07/2021 1214   ALKPHOS 57 10/07/2021 1214   BILITOT 0.4 10/07/2021 1214   GFRNONAA 51 (L) 10/07/2021 1214   GFRAA >60 10/03/2019 1133   Lab Results  Component Value Date   WBC 12.0 (H) 10/14/2021   NEUTROABS 10.1 (H) 10/14/2021   HGB 10.4 (L) 10/14/2021   HCT 30.6 (L) 10/14/2021   MCV 97.5 10/14/2021   PLT 192 10/14/2021    Imaging:  [facility] Clinician Interpretation: I have personally reviewed the CNS images as listed.  My interpretation, in the context of the patient's clinical presentation, is stable disease  MR BRAIN W WO CONTRAST  Result Date: 10/10/2021 CLINICAL DATA:  Glioblastoma with isocitrate dehydrogenase gene wildtype (HCC) C71.9 (ICD-10-CM). EXAM: MRI HEAD WITHOUT AND WITH CONTRAST TECHNIQUE: Multiplanar, multiecho pulse sequences of the brain and surrounding structures were obtained without and with intravenous contrast. COMPARISON:  MRI of the brain August 01, 2021. FINDINGS: Brain: Essentially unchanged appearance of surgical cavity in the left occipital lobe with surrounding large necrotic appearing area extending superiorly into the parietal lobe inferiorly along the temporal lobe with peripheral irregular contrast enhancement with some interval decrease in the deep multifocal punctate foci of contrast enhancement. The area measures approximately 6.5 x 4.4 x 3.2 cm, similar to prior. Surrounding area of T2 hyperintensity as well as areas of restricted diffusion also appear unchanged. No new focus of abnormal contrast enhancement identified. No acute infarction, hemorrhage, hydrocephalus or extra-axial collection. Vascular: Normal flow voids. Skull and upper cervical spine: Postsurgical changes from left parietooccipital craniotomy. No focal marrow lesion identified. Sinuses/Orbits: Negative. Other: None. IMPRESSION: Stable to slightly improved posttreatment findings when compared to  the most recent MRI performed in July 2023 suggesting treatment response. Electronically Signed   By: Pedro Earls M.D.   On: 10/10/2021 19:39     Assessment/Plan Glioblastoma, IDH-1 WT  Jillian Gomez is clinically stable today from neurologic standpoint.  MRI brain actually demonstrates stable findings overall despite no dosing of avastin in ~2 months.  24h urine protein resulted at >3,000.  This is just short of cutoff for nephrotic syndrome (>3,500).  We will continue to defer systemic therapy due to excessive skin breakdown, multiple open and healing wounds.  These are being managed by  wound care practice.  For significant unilateral leg edema, we will order DVT study ASAP.  Con't norvasc to 67m daily.  Decadron should STOP if tolerated.  We ask that Jillian FRONCZAKreturn to clinic in 2 week to discuss resuming treatment plan.  All questions were answered. The patient knows to call the clinic with any problems, questions or concerns. No barriers to learning were detected.  I have spent a total of 40 minutes of face-to-face and non-face-to-face time, excluding clinical staff time, preparing to see patient, ordering tests and/or medications, counseling the patient, and independently interpreting results and communicating results to the patient/family/caregiver    ZVentura Sellers MD Medical Director of Neuro-Oncology [facility] [place]10/10/23 11:58 AM

## 2021-10-15 ENCOUNTER — Ambulatory Visit (HOSPITAL_COMMUNITY)
Admission: RE | Admit: 2021-10-15 | Discharge: 2021-10-15 | Disposition: A | Discharge status: Home / Self Care | Payer: Medicare Other | Source: Ambulatory Visit | Attending: Internal Medicine | Admitting: Internal Medicine

## 2021-10-15 DIAGNOSIS — M7989 Other specified soft tissue disorders: Secondary | ICD-10-CM | POA: Diagnosis present

## 2021-10-15 NOTE — Progress Notes (Signed)
Lower extremity venous has been completed.   Preliminary results in CV Proc.   Marrissa Dai Alvy [place] 10/15/2021 1:42 PM

## 2021-10-16 ENCOUNTER — Other Ambulatory Visit: Payer: Self-pay

## 2021-10-17 ENCOUNTER — Other Ambulatory Visit: Payer: Self-pay

## 2021-10-20 ENCOUNTER — Telehealth: Payer: Self-pay | Admitting: *Deleted

## 2021-10-20 NOTE — Telephone Encounter (Signed)
Sister called to report that when changing wound on patients leg she noticed an odor and puss coming from the wound.  She was able to get her a quicker appointment with the wound center to evaluate however it will only be a nurse visit.    She needed to know if Dr Mickeal Skinner would prescribe something for the wound or if she needed to try to schedule her with her PCP.  Patient also has on file Rx for occasional skin issues of Doxycycline 100 mg.  Sister unsure of how much she has remaining and if that ABX would be appropriate.   Seeking advice from provider.  Routed to Dr Mickeal Skinner.

## 2021-10-20 NOTE — Telephone Encounter (Signed)
Spoke with [facility].  They stated that tomorrow's wound visit will be just a dressing change and typically they can swab a wound but they do not have a provider in office this week and will not be able to.   Encouraged to get a visit with local PCP or urgent care for swab and culture and appropriate prescription.    Sister will keep [place] posted if they can make this happen locally in [place].

## 2021-10-21 ENCOUNTER — Telehealth: Payer: Self-pay | Admitting: *Deleted

## 2021-10-21 NOTE — Telephone Encounter (Signed)
Per Dr Mickeal Skinner   Let's try the decadron first, and if not improved by Thursday, then ED  '8mg'$  today and tomorrow, then '4mg'$  daily    Spoke with [name] sister and she stated understanding.  She has enough medication to accomplish this on hand already.

## 2021-10-21 NOTE — Telephone Encounter (Signed)
Patients sister called to report decline in patient.  She is back to a previous baseline of being unable to walk and communicate clearly like in the past where she went to the local hospital.  Patient is no longer on steroids.  She is unsure if best course of action is to restart Decadron at home or call 911.  Patient has been off of steroids for several weeks according to the sister.    Routed to Dr Mickeal Skinner to please advise.

## 2021-10-22 ENCOUNTER — Other Ambulatory Visit: Payer: Self-pay | Admitting: Internal Medicine

## 2021-10-22 DIAGNOSIS — C719 Malignant neoplasm of brain, unspecified: Secondary | ICD-10-CM

## 2021-10-24 ENCOUNTER — Ambulatory Visit (HOSPITAL_COMMUNITY): Payer: Medicare Other

## 2021-10-28 ENCOUNTER — Other Ambulatory Visit: Payer: Self-pay

## 2021-10-28 ENCOUNTER — Inpatient Hospital Stay (HOSPITAL_BASED_OUTPATIENT_CLINIC_OR_DEPARTMENT_OTHER): Payer: Medicare Other | Admitting: Internal Medicine

## 2021-10-28 ENCOUNTER — Inpatient Hospital Stay: Payer: Medicare Other

## 2021-10-28 VITALS — BP 159/89 | HR 95 | Temp 98.1°F | Resp 15 | Wt 113.0 lb

## 2021-10-28 DIAGNOSIS — C714 Malignant neoplasm of occipital lobe: Secondary | ICD-10-CM | POA: Diagnosis not present

## 2021-10-28 DIAGNOSIS — C719 Malignant neoplasm of brain, unspecified: Secondary | ICD-10-CM | POA: Diagnosis not present

## 2021-10-28 LAB — CBC WITH DIFFERENTIAL (CANCER CENTER ONLY)
Abs Immature Granulocytes: 0.38 10*3/uL — ABNORMAL HIGH (ref 0.00–0.07)
Basophils Absolute: 0 10*3/uL (ref 0.0–0.1)
Basophils Relative: 0 %
Eosinophils Absolute: 0.1 10*3/uL (ref 0.0–0.5)
Eosinophils Relative: 0 %
HCT: 30.4 % — ABNORMAL LOW (ref 36.0–46.0)
Hemoglobin: 10.4 g/dL — ABNORMAL LOW (ref 12.0–15.0)
Immature Granulocytes: 3 %
Lymphocytes Relative: 7 %
Lymphs Abs: 0.9 10*3/uL (ref 0.7–4.0)
MCH: 32.2 pg (ref 26.0–34.0)
MCHC: 34.2 g/dL (ref 30.0–36.0)
MCV: 94.1 fL (ref 80.0–100.0)
Monocytes Absolute: 0.5 10*3/uL (ref 0.1–1.0)
Monocytes Relative: 4 %
Neutro Abs: 11.1 10*3/uL — ABNORMAL HIGH (ref 1.7–7.7)
Neutrophils Relative %: 86 %
Platelet Count: 273 10*3/uL (ref 150–400)
RBC: 3.23 MIL/uL — ABNORMAL LOW (ref 3.87–5.11)
RDW: 14.5 % (ref 11.5–15.5)
WBC Count: 12.9 10*3/uL — ABNORMAL HIGH (ref 4.0–10.5)
nRBC: 0.2 % (ref 0.0–0.2)

## 2021-10-28 LAB — CMP (CANCER CENTER ONLY)
ALT: 20 U/L (ref 0–44)
AST: 17 U/L (ref 15–41)
Albumin: 3.3 g/dL — ABNORMAL LOW (ref 3.5–5.0)
Alkaline Phosphatase: 76 U/L (ref 38–126)
Anion gap: 11 (ref 5–15)
BUN: 30 mg/dL — ABNORMAL HIGH (ref 8–23)
CO2: 25 mmol/L (ref 22–32)
Calcium: 8.4 mg/dL — ABNORMAL LOW (ref 8.9–10.3)
Chloride: 102 mmol/L (ref 98–111)
Creatinine: 1.24 mg/dL — ABNORMAL HIGH (ref 0.44–1.00)
GFR, Estimated: 48 mL/min — ABNORMAL LOW (ref >60)
Glucose, Bld: 180 mg/dL — ABNORMAL HIGH (ref 70–99)
Potassium: 3.8 mmol/L (ref 3.5–5.1)
Sodium: 138 mmol/L (ref 135–145)
Total Bilirubin: 0.4 mg/dL (ref 0.3–1.2)
Total Protein: 5.5 g/dL — ABNORMAL LOW (ref 6.5–8.1)

## 2021-10-28 LAB — TOTAL PROTEIN, URINE DIPSTICK: Protein, ur: 300 mg/dL — AB

## 2021-10-28 NOTE — Progress Notes (Signed)
[facility] [place], [place] [address] [phone]   Interval Evaluation  Date of Service: 10/28/21 Patient Name: Jillian Gomez Patient MRN: [id number] Patient DOB: August 06, 1955 Provider: Ventura Sellers, MD  Identifying Statement:  Jillian Gomez is a 66 y.o. female with left occipital glioblastoma   Oncologic History: Oncology History  Glioblastoma with isocitrate dehydrogenase gene wildtype ([place])  12/23/2018 Surgery   Craniotomy, left occipital resection by Dr. Kathyrn Sheriff.  Path is GBM IDH-wt   01/23/2019 - 03/03/2019 Radiation Therapy   IMRT with concurrent Temozolomide 48m/m2   03/28/2019 - 09/04/2019 Chemotherapy   5 cycles of adjuvant 5-day Temozolomide 2056mm2    09/01/2019 Progression   Progression of disease #1   09/19/2019 -  Chemotherapy   Begins second line therapy with oral CCNU 9072m2 q6 weeks, concurrent Avastin 5m54m IV q2 weeks   04/30/2020 -  Chemotherapy   Completes 4th cycle of CCNU/Avastin, transitions to Avastin monotherapy due to cytopenias   08/27/2020 -  Chemotherapy   Resumes CCNU+Avastin after periventricular DWI signal change noted on MRI    12/14/2020 Progression   Progression of disease #2   12/19/2020 - 04/01/2021 Chemotherapy   Initiates daily metronomic Temodar 50mg71mwith Avastin 5mg/78m2 weeks   03/30/2021 Progression   Progression of disease #3   04/08/2021 - 08/19/2021 Chemotherapy   Patient is on Treatment Plan : BRAIN GBM [facility] Irinotecan + Bevacizumab D1,15 q 28d     04/08/2021 -  Chemotherapy   Patient is on Treatment Plan : BRAIN GBM [facility] Irinotecan D1,15 + Bevacizumab D1,15 q 28d       Biomarkers:  MGMT Unknown.  IDH 1/2 Wild type.  EGFR Unknown  TERT Unknown   Interval History:  Jillian MICALAH CABEZASnts today for follow up.  She and sister describe return to near baseline following period of weakness, confusion, amnesia last week.  She is currently dosing 4mg  d88my decadron.  Discharge and foul smell has improved from the lower leg wound, now having completed 5 days of ciprofloxacin therapy.  She is continuing to see wound care and PCP for this.  Skin breakdown, ulceration has improved aside from small area on right lower leg.  Language, expression are more difficult today, but improved from 1 week ago.  No issues at all with weakness, numbness, no gait impairment since prior visit. No seizures or headaches.  H+P (01/09/19) Patient presented to medical attention in early December with ~2 months history of right sided visual impairment.  She describes fuzzy or blurry vision on that side which was progressive over time, leading to at least one fall.  MRI brain demonstrated enhancing left occipital mass; this was subsequently resected by Dr. NundkumKathyrn Sheriff18/20.  She had no issues with surgery and has completed her steroid taper.  Continues on keppra daily but no history of any seizure. She has returned to work with only modest difficutly.  No issues walking or performing ADLs; lives alone but her sister and other family members are nearby.  Medications: Current Outpatient Medications on File Prior to Visit  Medication Sig Dispense Refill   amLODipine (NORVASC) 5 MG tablet Take 1 tablet (5 mg total) by mouth daily. 60 tablet 1   Biotin 1 MG CAPS Take 1 mg by mouth daily.     CALCIUM PO Take 1 tablet by mouth daily.     cefadroxil (DURICEF) 500 MG capsule Take 2 capsules by mouth 2 (two)  times daily.     dexamethasone (DECADRON) 2 MG tablet Take 1 tablet (2 mg total) by mouth daily. 60 tablet 1   dexamethasone (DECADRON) 4 MG tablet TAKE 1 TABLET(4 MG) BY MOUTH DAILY 30 tablet 1   doxycycline (VIBRA-TABS) 100 MG tablet Take 1 tablet by mouth daily. (Patient not taking: Reported on 11/05/2020)     gabapentin (NEURONTIN) 300 MG capsule Take 1 capsule (300 mg total) by mouth 2 (two) times daily. 60 capsule 3   levothyroxine (SYNTHROID) 75 MCG tablet Take 75 mcg by  mouth daily.     naproxen sodium (ALEVE) 220 MG tablet Take 1 tablet (220 mg total) by mouth daily as needed (pain).     omeprazole (PRILOSEC) 20 MG capsule Take 1 capsule (20 mg total) by mouth daily. 30 capsule 3   ondansetron (ZOFRAN) 8 MG tablet Take 1 tablet (8 mg total) by mouth 2 (two) times daily as needed for nausea or vomiting. 20 tablet 0   rosuvastatin (CRESTOR) 10 MG tablet Take 5 mg by mouth at bedtime.     sertraline (ZOLOFT) 25 MG tablet TAKE 1 TABLET(25 MG) BY MOUTH DAILY 60 tablet 1   No current facility-administered medications on file prior to visit.    Allergies:  Allergies  Allergen Reactions   Etodolac Other (See Comments)    GI upset   Macrobid [Nitrofurantoin] Rash   Past Medical History:  Past Medical History:  Diagnosis Date   High cholesterol    Hypothyroidism    Joint pain    Thrombocytopenia (HCC) 02/26/2020   Past Surgical History:  Past Surgical History:  Procedure Laterality Date   APPLICATION OF CRANIAL NAVIGATION Left 12/23/2018   Procedure: APPLICATION OF CRANIAL NAVIGATION;  Surgeon: Nundkumar, Neelesh, MD;  Location: [facility] OR;  Service: Neurosurgery;  Laterality: Left;  APPLICATION OF CRANIAL NAVIGATION   BREAST BIOPSY Left    COLONOSCOPY     CRANIOTOMY Left 12/23/2018   Procedure: STEREOTACTIC LEFT OCCIPITAL CRANIOTOMY FOR RESECTION OF TUMOR;  Surgeon: Nundkumar, Neelesh, MD;  Location: [facility] OR;  Service: Neurosurgery;  Laterality: Left;  STEREOTACTIC LEFT OCCIPITAL CRANIOTOMY FOR RESECTION OF TUMOR   LASIK     Social History:  Social History   Socioeconomic History   Marital status: Single    Spouse name: Not on file   Number of children: Not on file   Years of education: Not on file   Highest education level: Not on file  Occupational History   Not on file  Tobacco Use   Smoking status: Never   Smokeless tobacco: Never  Vaping Use   Vaping Use: Never used  Substance and Sexual Activity   Alcohol use: Not Currently   Drug use:  Never   Sexual activity: Not on file  Other Topics Concern   Not on file  Social History Narrative   Not on file   Social Determinants of Health   Financial Resource Strain: Not on file  Food Insecurity: Not on file  Transportation Needs: Not on file  Physical Activity: Not on file  Stress: Not on file  Social Connections: Not on file  Intimate Partner Violence: Not on file   Family History: No family history on file.  Review of Systems: Constitutional: Denies fevers, chills or abnormal weight loss Eyes: Denies blurriness of vision Gastrointestinal:  Denies nausea, constipation, diarrhea GU: Denies dysuria or incontinence Skin: Denies abnormal skin rashes Musculoskeletal: Denies joint pain, back or neck discomfort.  Behavioral/Psych: Denies anxiety, mood instability  Physical   Exam: Vitals:   10/28/21 1215  BP: (!) 159/89  Pulse: 95  Resp: 15  Temp: 98.1 F (36.7 C)  SpO2: 100%   KPS: 70. General: Alert, cooperative, pleasant, in no acute distress Head: Craniotomy scar noted, dry and intact. EENT: No conjunctival injection or scleral icterus. Oral mucosa moist Lungs: Resp effort normal Cardiac: Regular rate and rhythm Abdomen: Soft, non-distended abdomen Skin: Right lower leg wound, ~3cm open and ulcerated Extremities: pitting edema right lower leg  Neurologic Exam: Mental Status: Awake, alert, attentive to examiner. Oriented to self and environment. Language is notable for notable expressive dyshpasia. Cranial Nerves: Visual acuity is grossly normal. Right homonymous hemianopia. Extra-ocular movements intact. No ptosis. Face is symmetric, tongue midline. Motor: Tone and bulk are normal. Power is full in both arms and legs. Reflexes are symmetric, no pathologic reflexes present. Intact finger to nose bilaterally Sensory: Intact to light touch and temperature Gait: Normal and tandem gait is deferred.   Labs: I have reviewed the data as listed    Component Value  Date/Time   NA 140 10/14/2021 1140   K 3.8 10/14/2021 1140   CL 107 10/14/2021 1140   CO2 27 10/14/2021 1140   GLUCOSE 152 (H) 10/14/2021 1140   BUN 34 (H) 10/14/2021 1140   CREATININE 1.19 (H) 10/14/2021 1140   CALCIUM 8.4 (L) 10/14/2021 1140   PROT 5.5 (L) 10/14/2021 1140   ALBUMIN 3.3 (L) 10/14/2021 1140   AST 15 10/14/2021 1140   ALT 19 10/14/2021 1140   ALKPHOS 61 10/14/2021 1140   BILITOT 0.3 10/14/2021 1140   GFRNONAA 51 (L) 10/14/2021 1140   GFRAA >60 10/03/2019 1133   Lab Results  Component Value Date   WBC 12.9 (H) 10/28/2021   NEUTROABS 11.1 (H) 10/28/2021   HGB 10.4 (L) 10/28/2021   HCT 30.4 (L) 10/28/2021   MCV 94.1 10/28/2021   PLT 273 10/28/2021    Imaging:  [place] Clinician Interpretation: I have personally reviewed the CNS images as listed.  My interpretation, in the context of the patient's clinical presentation, is stable disease  VAS [place] LOWER EXTREMITY VENOUS (DVT)  Result Date: 10/15/2021  Lower Venous DVT Study Patient Name:  BRICEYDA ABDULLAH  Date of Exam:   10/15/2021 Medical Rec #: [id number]       Accession #:    [id number] Date of Birth: 12-27-1955        Patient Gender: F Patient Age:   26 years Exam Location:  [facility] Procedure:      VAS [place] LOWER EXTREMITY VENOUS (DVT) Referring Phys: Alroy Dust Jalasia Eskridge --------------------------------------------------------------------------------  Indications: Edema, and Swelling.  Comparison Study: 02/21/19 prior Performing Technologist: Archie Patten RVS  Examination Guidelines: A complete evaluation includes B-mode imaging, spectral Doppler, color Doppler, and power Doppler as needed of all accessible portions of each vessel. Bilateral testing is considered an integral part of a complete examination. Limited examinations for reoccurring indications may be performed as noted. The reflux portion of the exam is performed with the patient in reverse Trendelenburg.   +---------+---------------+---------+-----------+----------+--------------+ RIGHT    CompressibilityPhasicitySpontaneityPropertiesThrombus Aging +---------+---------------+---------+-----------+----------+--------------+ CFV      Full           Yes      Yes                                 +---------+---------------+---------+-----------+----------+--------------+ SFJ      Full                                                        +---------+---------------+---------+-----------+----------+--------------+  FV Prox  Full                                                        +---------+---------------+---------+-----------+----------+--------------+ FV Mid   Full                                                        +---------+---------------+---------+-----------+----------+--------------+ FV DistalFull                                                        +---------+---------------+---------+-----------+----------+--------------+ PFV      Full                                                        +---------+---------------+---------+-----------+----------+--------------+ POP      Full           Yes      Yes                                 +---------+---------------+---------+-----------+----------+--------------+ PTV      Full                                                        +---------+---------------+---------+-----------+----------+--------------+ PERO     Full                                                        +---------+---------------+---------+-----------+----------+--------------+   +----+---------------+---------+-----------+----------+--------------+ LEFTCompressibilityPhasicitySpontaneityPropertiesThrombus Aging +----+---------------+---------+-----------+----------+--------------+ CFV Full           Yes      Yes                                  +----+---------------+---------+-----------+----------+--------------+     Summary: RIGHT: - There is no evidence of deep vein thrombosis in the lower extremity.  - No cystic structure found in the popliteal fossa.  LEFT: - No evidence of common femoral vein obstruction.  *See table(s) above for measurements and observations. Electronically signed by Vance Brabham MD on 10/15/2021 at 9:29:56 PM.    Final    MR BRAIN W WO CONTRAST  Result Date: 10/10/2021 CLINICAL DATA:  Glioblastoma with isocitrate dehydrogenase gene wildtype (HCC) C71.9 (ICD-10-CM). EXAM: MRI HEAD WITHOUT AND WITH CONTRAST TECHNIQUE: Multiplanar, multiecho pulse sequences of the brain and surrounding structures were obtained without and with intravenous contrast. COMPARISON:  MRI of the brain   August 01, 2021. FINDINGS: Brain: Essentially unchanged appearance of surgical cavity in the left occipital lobe with surrounding large necrotic appearing area extending superiorly into the parietal lobe inferiorly along the temporal lobe with peripheral irregular contrast enhancement with some interval decrease in the deep multifocal punctate foci of contrast enhancement. The area measures approximately 6.5 x 4.4 x 3.2 cm, similar to prior. Surrounding area of T2 hyperintensity as well as areas of restricted diffusion also appear unchanged. No new focus of abnormal contrast enhancement identified. No acute infarction, hemorrhage, hydrocephalus or extra-axial collection. Vascular: Normal flow voids. Skull and upper cervical spine: Postsurgical changes from left parietooccipital craniotomy. No focal marrow lesion identified. Sinuses/Orbits: Negative. Other: None. IMPRESSION: Stable to slightly improved posttreatment findings when compared to the most recent MRI performed in July 2023 suggesting treatment response. Electronically Signed   By: Pedro Earls M.D.   On: 10/10/2021 19:39     Assessment/Plan Glioblastoma, IDH-1 WT  GENNIE EISINGER is clinically improved today after significant decline in functional status last week.  Etiology of this decline is suspected acute on chronic encephalopathy secondary to active soft tissue infection.  It's also possible, though speculative, that she could have experienced a provoked and unwitnessed focal seizure.  She has nonetheless improved with steroids and ciprofloxacin.  Motor function remains at prior baseline.  Labs and proteinuria have improved overall.  We gave her the option of proceeding with chemotherapy (not avastin due to open wound), with the understanding this would increase the risk of infection and potentially interfere with healing of right leg ulceration.  She elected to defer treatment again for an additional two weeks.  In the meantime she will continue on decadron 60m daily.  She is at high risk for further skin breakdown, and will be mindful of safety/falls around the home.  Con't norvasc 516mdaily.  We ask that RoDIOR DOMINIKeturn to clinic in 2 week to discuss resuming treatment plan.  All questions were answered. The patient knows to call the clinic with any problems, questions or concerns. No barriers to learning were detected.  I have spent a total of 30 minutes of face-to-face and non-face-to-face time, excluding clinical staff time, preparing to see patient, ordering tests and/or medications, counseling the patient, and independently interpreting results and communicating results to the patient/family/caregiver    ZaVentura SellersMD Medical Director of Neuro-Oncology [facility]0/24/23 12:29 PM

## 2021-10-29 ENCOUNTER — Other Ambulatory Visit: Payer: Self-pay

## 2021-10-30 ENCOUNTER — Other Ambulatory Visit: Payer: Self-pay

## 2021-11-11 ENCOUNTER — Inpatient Hospital Stay: Payer: Medicare Other | Attending: Internal Medicine

## 2021-11-11 ENCOUNTER — Inpatient Hospital Stay (HOSPITAL_BASED_OUTPATIENT_CLINIC_OR_DEPARTMENT_OTHER): Payer: Medicare Other | Admitting: Internal Medicine

## 2021-11-11 ENCOUNTER — Inpatient Hospital Stay: Payer: Medicare Other

## 2021-11-11 ENCOUNTER — Other Ambulatory Visit: Payer: Self-pay

## 2021-11-11 VITALS — BP 176/87 | HR 88 | Temp 97.8°F | Resp 16 | Wt 116.3 lb

## 2021-11-11 VITALS — BP 152/80 | HR 82 | Temp 97.7°F | Resp 18

## 2021-11-11 DIAGNOSIS — Z79899 Other long term (current) drug therapy: Secondary | ICD-10-CM | POA: Insufficient documentation

## 2021-11-11 DIAGNOSIS — C719 Malignant neoplasm of brain, unspecified: Secondary | ICD-10-CM

## 2021-11-11 DIAGNOSIS — Z7189 Other specified counseling: Secondary | ICD-10-CM | POA: Diagnosis not present

## 2021-11-11 DIAGNOSIS — Z7952 Long term (current) use of systemic steroids: Secondary | ICD-10-CM | POA: Insufficient documentation

## 2021-11-11 DIAGNOSIS — E039 Hypothyroidism, unspecified: Secondary | ICD-10-CM | POA: Diagnosis not present

## 2021-11-11 DIAGNOSIS — C714 Malignant neoplasm of occipital lobe: Secondary | ICD-10-CM | POA: Insufficient documentation

## 2021-11-11 DIAGNOSIS — Z5111 Encounter for antineoplastic chemotherapy: Secondary | ICD-10-CM | POA: Insufficient documentation

## 2021-11-11 LAB — CMP (CANCER CENTER ONLY)
ALT: 27 U/L (ref 0–44)
AST: 17 U/L (ref 15–41)
Albumin: 3.3 g/dL — ABNORMAL LOW (ref 3.5–5.0)
Alkaline Phosphatase: 62 U/L (ref 38–126)
Anion gap: 9 (ref 5–15)
BUN: 41 mg/dL — ABNORMAL HIGH (ref 8–23)
CO2: 22 mmol/L (ref 22–32)
Calcium: 8.7 mg/dL — ABNORMAL LOW (ref 8.9–10.3)
Chloride: 106 mmol/L (ref 98–111)
Creatinine: 1.13 mg/dL — ABNORMAL HIGH (ref 0.44–1.00)
GFR, Estimated: 54 mL/min — ABNORMAL LOW
Glucose, Bld: 175 mg/dL — ABNORMAL HIGH (ref 70–99)
Potassium: 5.3 mmol/L — ABNORMAL HIGH (ref 3.5–5.1)
Sodium: 137 mmol/L (ref 135–145)
Total Bilirubin: 0.4 mg/dL (ref 0.3–1.2)
Total Protein: 5.3 g/dL — ABNORMAL LOW (ref 6.5–8.1)

## 2021-11-11 LAB — CBC WITH DIFFERENTIAL (CANCER CENTER ONLY)
Abs Immature Granulocytes: 0.18 10*3/uL — ABNORMAL HIGH (ref 0.00–0.07)
Basophils Absolute: 0 10*3/uL (ref 0.0–0.1)
Basophils Relative: 0 %
Eosinophils Absolute: 0 10*3/uL (ref 0.0–0.5)
Eosinophils Relative: 0 %
HCT: 32.2 % — ABNORMAL LOW (ref 36.0–46.0)
Hemoglobin: 10.8 g/dL — ABNORMAL LOW (ref 12.0–15.0)
Immature Granulocytes: 1 %
Lymphocytes Relative: 9 %
Lymphs Abs: 1.2 10*3/uL (ref 0.7–4.0)
MCH: 31.9 pg (ref 26.0–34.0)
MCHC: 33.5 g/dL (ref 30.0–36.0)
MCV: 95 fL (ref 80.0–100.0)
Monocytes Absolute: 0.8 10*3/uL (ref 0.1–1.0)
Monocytes Relative: 6 %
Neutro Abs: 10.8 10*3/uL — ABNORMAL HIGH (ref 1.7–7.7)
Neutrophils Relative %: 84 %
Platelet Count: 198 10*3/uL (ref 150–400)
RBC: 3.39 MIL/uL — ABNORMAL LOW (ref 3.87–5.11)
RDW: 14.6 % (ref 11.5–15.5)
WBC Count: 13 10*3/uL — ABNORMAL HIGH (ref 4.0–10.5)
nRBC: 0.2 % (ref 0.0–0.2)

## 2021-11-11 LAB — TOTAL PROTEIN, URINE DIPSTICK: Protein, ur: 300 mg/dL — AB

## 2021-11-11 MED ORDER — PALONOSETRON HCL INJECTION 0.25 MG/5ML
0.2500 mg | Freq: Once | INTRAVENOUS | Status: AC
  Administered 2021-11-11: 0.25 mg via INTRAVENOUS
  Filled 2021-11-11: qty 5

## 2021-11-11 MED ORDER — SODIUM CHLORIDE 0.9 % IV SOLN
125.0000 mg/m2 | Freq: Once | INTRAVENOUS | Status: AC
  Administered 2021-11-11: 180 mg via INTRAVENOUS
  Filled 2021-11-11: qty 9

## 2021-11-11 MED ORDER — SODIUM CHLORIDE 0.9 % IV SOLN: Freq: Once | INTRAVENOUS | Status: AC

## 2021-11-11 MED ORDER — SODIUM CHLORIDE 0.9 % IV SOLN
10.0000 mg | Freq: Once | INTRAVENOUS | Status: AC
  Administered 2021-11-11: 10 mg via INTRAVENOUS
  Filled 2021-11-11: qty 10

## 2021-11-11 MED ORDER — ATROPINE SULFATE 1 MG/ML IV SOLN
0.5000 mg | Freq: Once | INTRAVENOUS | Status: AC | PRN
  Administered 2021-11-11: 0.5 mg via INTRAVENOUS
  Filled 2021-11-11: qty 1

## 2021-11-11 NOTE — Patient Instructions (Signed)
[facility] ONCOL[facility]  Discharge Instructions: Thank you for choosing [facility] to provide your oncology and hematology care.   If you have a lab appointment with the [facility], please go directly to the [facility] and check in at the registration area.   Wear comfortable clothing and clothing appropriate for easy access to any Portacath or PICC line.   We strive to give you quality time with your provider. You may need to reschedule your appointment if you arrive late (15 or more minutes).  Arriving late affects you and other patients whose appointments are after yours.  Also, if you miss three or more appointments without notifying the office, you may be dismissed from the clinic at the provider's discretion.      For prescription refill requests, have your pharmacy contact our office and allow 72 hours for refills to be completed.    Today you received the following chemotherapy and/or immunotherapy agent: Irinotecan   To help prevent nausea and vomiting after your treatment, we encourage you to take your nausea medication as directed.  BELOW ARE SYMPTOMS THAT SHOULD BE REPORTED IMMEDIATELY: *FEVER GREATER THAN 100.4 F (38 C) OR HIGHER *CHILLS OR SWEATING *NAUSEA AND VOMITING THAT IS NOT CONTROLLED WITH YOUR NAUSEA MEDICATION *UNUSUAL SHORTNESS OF BREATH *UNUSUAL BRUISING OR BLEEDING *URINARY PROBLEMS (pain or burning when urinating, or frequent urination) *BOWEL PROBLEMS (unusual diarrhea, constipation, pain near the anus) TENDERNESS IN MOUTH AND THROAT WITH OR WITHOUT PRESENCE OF ULCERS (sore throat, sores in mouth, or a toothache) UNUSUAL RASH, SWELLING OR PAIN  UNUSUAL VAGINAL DISCHARGE OR ITCHING   Items with * indicate a potential emergency and should be followed up as soon as possible or go to the Emergency Department if any problems should occur.  Please show the CHEMOTHERAPY ALERT CARD or IMMUNOTHERAPY ALERT CARD at check-in to the  Emergency Department and triage nurse.  Should you have questions after your visit or need to cancel or reschedule your appointment, please contact [facility]  Dept: [phone]  and follow the prompts.  Office hours are 8:00 a.m. to 4:30 p.m. Monday - Friday. Please note that voicemails left after 4:00 p.m. may not be returned until the following business day.  We are closed weekends and major holidays. You have access to a nurse at all times for urgent questions. Please call the main number to the clinic Dept: [phone] and follow the prompts.   For any non-urgent questions, you may also contact your provider using MyChart. We now offer e-Visits for anyone 65 and older to request care online for non-urgent symptoms. For details visit [email or web address].   Also download the MyChart app! Go to the app store, search "MyChart", open the app, select [name], and log in with your MyChart username and password.  Masks are optional in the cancer centers. If you would like for your care team to wear a mask while they are taking care of you, please let them know. You may have one support person who is at least 65 years old accompany you for your appointments. Irinotecan Injection What is this medication? IRINOTECAN (ir in oh TEE kan) treats some types of cancer. It works by slowing down the growth of cancer cells. This medicine may be used for other purposes; ask your health care provider or pharmacist if you have questions. COMMON BRAND NAME(S): Camptosar What should I tell my care team before I take this medication? They need to  know if you have any of these conditions: Dehydration Diarrhea Infection, especially a viral infection, such as chickenpox, cold sores, herpes Liver disease Low blood cell levels (white cells, red cells, and platelets) Low levels of electrolytes, such as calcium, magnesium, or potassium in your blood Recent or ongoing radiation An  unusual or allergic reaction to irinotecan, other medications, foods, dyes, or preservatives If you or your partner are pregnant or trying to get pregnant Breast-feeding How should I use this medication? This medication is injected into a vein. It is given by your care team in a hospital or clinic setting. Talk to your care team about the use of this medication in children. Special care may be needed. Overdosage: If you think you have taken too much of this medicine contact a poison control center or emergency room at once. NOTE: This medicine is only for you. Do not share this medicine with others. What if I miss a dose? Keep appointments for follow-up doses. It is important not to miss your dose. Call your care team if you are unable to keep an appointment. What may interact with this medication? Do not take this medication with any of the following: Cobicistat Itraconazole This medication may also interact with the following: Certain antibiotics, such as clarithromycin, rifampin, rifabutin Certain antivirals for HIV or AIDS Certain medications for fungal infections, such as ketoconazole, posaconazole, voriconazole Certain medications for seizures, such as carbamazepine, phenobarbital, phenytoin Gemfibrozil Nefazodone St. John's wort This list may not describe all possible interactions. Give your health care provider a list of all the medicines, herbs, non-prescription drugs, or dietary supplements you use. Also tell them if you smoke, drink alcohol, or use illegal drugs. Some items may interact with your medicine. What should I watch for while using this medication? Your condition will be monitored carefully while you are receiving this medication. You may need blood work while taking this medication. This medication may make you feel generally unwell. This is not uncommon as chemotherapy can affect healthy cells as well as cancer cells. Report any side effects. Continue your course of  treatment even though you feel ill unless your care team tells you to stop. This medication can cause serious side effects. To reduce the risk, your care team may give you other medications to take before receiving this one. Be sure to follow the directions from your care team. This medication may affect your coordination, reaction time, or judgement. Do not drive or operate machinery until you know how this medication affects you. Sit up or stand slowly to reduce the risk of dizzy or fainting spells. Drinking alcohol with this medication can increase the risk of these side effects. This medication may increase your risk of getting an infection. Call your care team for advice if you get a fever, chills, sore throat, or other symptoms of a cold or flu. Do not treat yourself. Try to avoid being around people who are sick. Avoid taking medications that contain aspirin, acetaminophen, ibuprofen, naproxen, or ketoprofen unless instructed by your care team. These medications may hide a fever. This medication may increase your risk to bruise or bleed. Call your care team if you notice any unusual bleeding. Be careful brushing or flossing your teeth or using a toothpick because you may get an infection or bleed more easily. If you have any dental work done, tell your dentist you are receiving this medication. Talk to your care team if you or your partner are pregnant or think either of you  might be pregnant. This medication can cause serious birth defects if taken during pregnancy and for 6 months after the last dose. You will need a negative pregnancy test before starting this medication. Contraception is recommended while taking this medication and for 6 months after the last dose. Your care team can help you find the option that works for you. Do not father a child while taking this medication and for 3 months after the last dose. Use a condom for contraception during this time period. Do not breastfeed while  taking this medication and for 7 days after the last dose. This medication may cause infertility. Talk to your care team if you are concerned about your fertility. What side effects may I notice from receiving this medication? Side effects that you should report to your care team as soon as possible: Allergic reactions--skin rash, itching, hives, swelling of the face, lips, tongue, or throat Dry cough, shortness of breath or trouble breathing Increased saliva or tears, increased sweating, stomach cramping, diarrhea, small pupils, unusual weakness or fatigue, slow heartbeat Infection--fever, chills, cough, sore throat, wounds that don't heal, pain or trouble when passing urine, general feeling of discomfort or being unwell Kidney injury--decrease in the amount of urine, swelling of the ankles, hands, or feet Low red blood cell level--unusual weakness or fatigue, dizziness, headache, trouble breathing Severe or prolonged diarrhea Unusual bruising or bleeding Side effects that usually do not require medical attention (report to your care team if they continue or are bothersome): Constipation Diarrhea Hair loss Loss of appetite Nausea Stomach pain This list may not describe all possible side effects. Call your doctor for medical advice about side effects. You may report side effects to FDA at 1-800-FDA-1088. Where should I keep my medication? This medication is given in a hospital or clinic. It will not be stored at home. NOTE: This sheet is a summary. It may not cover all possible information. If you have questions about this medicine, talk to your doctor, pharmacist, or health care provider.  2023 Elsevier/Gold Standard (2021-05-01 00:00:00)

## 2021-11-11 NOTE — Progress Notes (Signed)
Dr. Mickeal Skinner notified of Potassium 5.3 mmol/L ok to proceed with treatment of Irinotecan only, no further orders.

## 2021-11-11 NOTE — Progress Notes (Signed)
[facility] [place], [place] [address] [phone]   Interval Evaluation  Date of Service: 11/11/21 Patient Name: Jillian Gomez Patient MRN: [id number] Patient DOB: [birth year] Provider: Ventura Sellers, MD  Identifying Statement:  Jillian Gomez is a 66 y.o. female with left occipital glioblastoma   Oncologic History: Oncology History  Glioblastoma with isocitrate dehydrogenase gene wildtype ([facility])  12/23/2018 Surgery   Craniotomy, left occipital resection by Dr. Kathyrn Sheriff.  Path is GBM IDH-wt   01/23/2019 - 03/03/2019 Radiation Therapy   IMRT with concurrent Temozolomide 23m/m2   03/28/2019 - 09/04/2019 Chemotherapy   5 cycles of adjuvant 5-day Temozolomide 2070mm2    09/01/2019 Progression   Progression of disease #1   09/19/2019 -  Chemotherapy   Begins second line therapy with oral CCNU 9056m2 q6 weeks, concurrent Avastin 47m35m IV q2 weeks   04/30/2020 -  Chemotherapy   Completes 4th cycle of CCNU/Avastin, transitions to Avastin monotherapy due to cytopenias   08/27/2020 -  Chemotherapy   Resumes CCNU+Avastin after periventricular DWI signal change noted on MRI    12/14/2020 Progression   Progression of disease #2   12/19/2020 - 04/01/2021 Chemotherapy   Initiates daily metronomic Temodar 50mg55mwith Avastin 47mg/91m2 weeks   03/30/2021 Progression   Progression of disease #3   04/08/2021 - 08/19/2021 Chemotherapy   Patient is on Treatment Plan : BRAIN GBM [facility] Irinotecan + Bevacizumab D1,15 q 28d     04/08/2021 -  Chemotherapy   Patient is on Treatment Plan : BRAIN GBM [facility] Irinotecan D1,15 + Bevacizumab D1,15 q 28d       Biomarkers:  MGMT Unknown.  IDH 1/2 Wild type.  EGFR Unknown  TERT Unknown   Interval History:  Jillian ANNISTEN MANCHESTERnts today for follow up.  She and sister describe return to near baseline following period of weakness, confusion, amnesia last week.  She is currently dosing 4mg  d24my decadron.  Discharge and foul smell has improved from the lower leg wound, now having completed 5 days of ciprofloxacin therapy.  She is continuing to see wound care and PCP for this.  Skin breakdown, ulceration has improved aside from small area on right lower leg.  Language, expression are more difficult today, but improved from 1 week ago.  No issues at all with weakness, numbness, no gait impairment since prior visit. No seizures or headaches.  H+P (01/09/19) Patient presented to medical attention in early December with ~2 months history of right sided visual impairment.  She describes fuzzy or blurry vision on that side which was progressive over time, leading to at least one fall.  MRI brain demonstrated enhancing left occipital mass; this was subsequently resected by Dr. NundkumKathyrn Sheriff18/20.  She had no issues with surgery and has completed her steroid taper.  Continues on keppra daily but no history of any seizure. She has returned to work with only modest difficutly.  No issues walking or performing ADLs; lives alone but her sister and other family members are nearby.  Medications: Current Outpatient Medications on File Prior to Visit  Medication Sig Dispense Refill   amLODipine (NORVASC) 5 MG tablet Take 1 tablet (5 mg total) by mouth daily. 60 tablet 1   Biotin 1 MG CAPS Take 1 mg by mouth daily.     CALCIUM PO Take 1 tablet by mouth daily.     cefadroxil (DURICEF) 500 MG capsule Take 2 capsules by mouth 2 (two)  times daily.     dexamethasone (DECADRON) 2 MG tablet Take 1 tablet (2 mg total) by mouth daily. 60 tablet 1   dexamethasone (DECADRON) 4 MG tablet TAKE 1 TABLET(4 MG) BY MOUTH DAILY 30 tablet 1   doxycycline (VIBRA-TABS) 100 MG tablet Take 1 tablet by mouth daily. (Patient not taking: Reported on 11/05/2020)     gabapentin (NEURONTIN) 300 MG capsule Take 1 capsule (300 mg total) by mouth 2 (two) times daily. 60 capsule 3   levothyroxine (SYNTHROID) 75 MCG tablet Take 75 mcg by  mouth daily.     naproxen sodium (ALEVE) 220 MG tablet Take 1 tablet (220 mg total) by mouth daily as needed (pain).     omeprazole (PRILOSEC) 20 MG capsule Take 1 capsule (20 mg total) by mouth daily. 30 capsule 3   ondansetron (ZOFRAN) 8 MG tablet Take 1 tablet (8 mg total) by mouth 2 (two) times daily as needed for nausea or vomiting. 20 tablet 0   rosuvastatin (CRESTOR) 10 MG tablet Take 5 mg by mouth at bedtime.     sertraline (ZOLOFT) 25 MG tablet TAKE 1 TABLET(25 MG) BY MOUTH DAILY 60 tablet 1   No current facility-administered medications on file prior to visit.    Allergies:  Allergies  Allergen Reactions   Etodolac Other (See Comments)    GI upset   Macrobid [Nitrofurantoin] Rash   Past Medical History:  Past Medical History:  Diagnosis Date   High cholesterol    Hypothyroidism    Joint pain    Thrombocytopenia (HCC) 02/26/2020   Past Surgical History:  Past Surgical History:  Procedure Laterality Date   APPLICATION OF CRANIAL NAVIGATION Left 12/23/2018   Procedure: APPLICATION OF CRANIAL NAVIGATION;  Surgeon: Nundkumar, Neelesh, MD;  Location: [facility] OR;  Service: Neurosurgery;  Laterality: Left;  APPLICATION OF CRANIAL NAVIGATION   BREAST BIOPSY Left    COLONOSCOPY     CRANIOTOMY Left 12/23/2018   Procedure: STEREOTACTIC LEFT OCCIPITAL CRANIOTOMY FOR RESECTION OF TUMOR;  Surgeon: Nundkumar, Neelesh, MD;  Location: [facility] OR;  Service: Neurosurgery;  Laterality: Left;  STEREOTACTIC LEFT OCCIPITAL CRANIOTOMY FOR RESECTION OF TUMOR   LASIK     Social History:  Social History   Socioeconomic History   Marital status: Single    Spouse name: Not on file   Number of children: Not on file   Years of education: Not on file   Highest education level: Not on file  Occupational History   Not on file  Tobacco Use   Smoking status: Never   Smokeless tobacco: Never  Vaping Use   Vaping Use: Never used  Substance and Sexual Activity   Alcohol use: Not Currently   Drug use:  Never   Sexual activity: Not on file  Other Topics Concern   Not on file  Social History Narrative   Not on file   Social Determinants of Health   Financial Resource Strain: Not on file  Food Insecurity: Not on file  Transportation Needs: Not on file  Physical Activity: Not on file  Stress: Not on file  Social Connections: Not on file  Intimate Partner Violence: Not on file   Family History: No family history on file.  Review of Systems: Constitutional: Denies fevers, chills or abnormal weight loss Eyes: Denies blurriness of vision Gastrointestinal:  Denies nausea, constipation, diarrhea GU: Denies dysuria or incontinence Skin: Denies abnormal skin rashes Musculoskeletal: Denies joint pain, back or neck discomfort.  Behavioral/Psych: Denies anxiety, mood instability  Physical   Exam: Vitals:   11/11/21 1057  BP: (!) 176/87  Pulse: 88  Resp: 16  Temp: 97.8 F (36.6 C)  SpO2: 100%   KPS: 70. General: Alert, cooperative, pleasant, in no acute distress Head: Craniotomy scar noted, dry and intact. EENT: No conjunctival injection or scleral icterus. Oral mucosa moist Lungs: Resp effort normal Cardiac: Regular rate and rhythm Abdomen: Soft, non-distended abdomen Skin: Right lower leg wound, ~3cm open and ulcerated Extremities: pitting edema right lower leg  Neurologic Exam: Mental Status: Awake, alert, attentive to examiner. Oriented to self and environment. Language is notable for notable expressive dyshpasia. Cranial Nerves: Visual acuity is grossly normal. Right homonymous hemianopia. Extra-ocular movements intact. No ptosis. Face is symmetric, tongue midline. Motor: Tone and bulk are normal. Power is full in both arms and legs. Reflexes are symmetric, no pathologic reflexes present. Intact finger to nose bilaterally Sensory: Intact to light touch and temperature Gait: Normal and tandem gait is deferred.   Labs: I have reviewed the data as listed    Component Value  Date/Time   NA 138 10/28/2021 1200   K 3.8 10/28/2021 1200   CL 102 10/28/2021 1200   CO2 25 10/28/2021 1200   GLUCOSE 180 (H) 10/28/2021 1200   BUN 30 (H) 10/28/2021 1200   CREATININE 1.24 (H) 10/28/2021 1200   CALCIUM 8.4 (L) 10/28/2021 1200   PROT 5.5 (L) 10/28/2021 1200   ALBUMIN 3.3 (L) 10/28/2021 1200   AST 17 10/28/2021 1200   ALT 20 10/28/2021 1200   ALKPHOS 76 10/28/2021 1200   BILITOT 0.4 10/28/2021 1200   GFRNONAA 48 (L) 10/28/2021 1200   GFRAA >60 10/03/2019 1133   Lab Results  Component Value Date   WBC 13.0 (H) 11/11/2021   NEUTROABS 10.8 (H) 11/11/2021   HGB 10.8 (L) 11/11/2021   HCT 32.2 (L) 11/11/2021   MCV 95.0 11/11/2021   PLT 198 11/11/2021    Imaging:  [facility] Clinician Interpretation: I have personally reviewed the CNS images as listed.  My interpretation, in the context of the patient's clinical presentation, is stable disease  VAS [place] LOWER EXTREMITY VENOUS (DVT)  Result Date: 10/15/2021  Lower Venous DVT Study Patient Name:  Jillian Gomez  Date of Exam:   10/15/2021 Medical Rec #: [id number]       Accession #:    [id number] Date of Birth: December 11, 1955        Patient Gender: F Patient Age:   55 years Exam Location:  [facility] Procedure:      VAS [place] LOWER EXTREMITY VENOUS (DVT) Referring Phys: Alroy Dust VASLOW --------------------------------------------------------------------------------  Indications: Edema, and Swelling.  Comparison Study: 02/21/19 prior Performing Technologist: Archie Patten RVS  Examination Guidelines: A complete evaluation includes B-mode imaging, spectral Doppler, color Doppler, and power Doppler as needed of all accessible portions of each vessel. Bilateral testing is considered an integral part of a complete examination. Limited examinations for reoccurring indications may be performed as noted. The reflux portion of the exam is performed with the patient in reverse Trendelenburg.   +---------+---------------+---------+-----------+----------+--------------+ RIGHT    CompressibilityPhasicitySpontaneityPropertiesThrombus Aging +---------+---------------+---------+-----------+----------+--------------+ CFV      Full           Yes      Yes                                 +---------+---------------+---------+-----------+----------+--------------+ SFJ      Full                                                        +---------+---------------+---------+-----------+----------+--------------+  FV Prox  Full                                                        +---------+---------------+---------+-----------+----------+--------------+ FV Mid   Full                                                        +---------+---------------+---------+-----------+----------+--------------+ FV DistalFull                                                        +---------+---------------+---------+-----------+----------+--------------+ PFV      Full                                                        +---------+---------------+---------+-----------+----------+--------------+ POP      Full           Yes      Yes                                 +---------+---------------+---------+-----------+----------+--------------+ PTV      Full                                                        +---------+---------------+---------+-----------+----------+--------------+ PERO     Full                                                        +---------+---------------+---------+-----------+----------+--------------+   +----+---------------+---------+-----------+----------+--------------+ LEFTCompressibilityPhasicitySpontaneityPropertiesThrombus Aging +----+---------------+---------+-----------+----------+--------------+ CFV Full           Yes      Yes                                  +----+---------------+---------+-----------+----------+--------------+     Summary: RIGHT: - There is no evidence of deep vein thrombosis in the lower extremity.  - No cystic structure found in the popliteal fossa.  LEFT: - No evidence of common femoral vein obstruction.  *See table(s) above for measurements and observations. Electronically signed by Harold Barban MD on 10/15/2021 at 9:29:56 PM.    Final      Assessment/Plan Glioblastoma, IDH-1 WT  Jillian Gomez is clinically stable today.  Right lower leg wound continues to heal, but is still open and uclerated.  She is meeting again with wound care later this week.  Motor function remains at prior baseline.  Labs and proteinuria have improved  overall.  We are ok to proceed with Irinotecan today, not avastin due to open wound.  Because of cushingoid features, ongoing skin breakdown, will recommend decreasing decadron to 20m daily x7 days, then 162mdaily thereafter.  Hopefully we can resume dose reduced avastin soon, pending wound healing.  Con't norvasc 38m57maily.  We ask that Jillian RYLANDturn to clinic in 2 week for CPT-11 and avastin.  All questions were answered. The patient knows to call the clinic with any problems, questions or concerns. No barriers to learning were detected.  I have spent a total of 30 minutes of face-to-face and non-face-to-face time, excluding clinical staff time, preparing to see patient, ordering tests and/or medications, counseling the patient, and independently interpreting results and communicating results to the patient/family/caregiver    ZacVentura SellersD Medical Director of Neuro-Oncology [facility] [place]/07/23 11:07 AM

## 2021-11-13 ENCOUNTER — Other Ambulatory Visit: Payer: Self-pay

## 2021-11-20 ENCOUNTER — Telehealth: Payer: Self-pay | Admitting: *Deleted

## 2021-11-20 NOTE — Telephone Encounter (Signed)
Sister left note for nurse stating. Just an FYI for Dr Mickeal Skinner regarding Jillian Gomez.  Saw wound Dr yesterday.  Its looking ok but he said may still not be healed over in a week (you know it's sort of like raw tissue, damp, etc).  You may decide it's worth going on with Avastin anyway, but if you think better to push appt to the following wee just let us know.  Jessalynn & I will be fine either way (I know you prefer the chemo along with Avastin).   Thanks, [name]    Routed to Dr Mickeal Skinner   Patient is scheduled for Avastin and Irinotecan on 11/25/2021.

## 2021-11-20 NOTE — Telephone Encounter (Signed)
Sent message to push appointments out to week of 12/01/2021 per Dr Mickeal Skinner.  Sister is aware.

## 2021-11-22 ENCOUNTER — Other Ambulatory Visit: Payer: Self-pay

## 2021-11-24 ENCOUNTER — Other Ambulatory Visit: Payer: Self-pay | Admitting: *Deleted

## 2021-11-24 ENCOUNTER — Other Ambulatory Visit (HOSPITAL_COMMUNITY): Payer: Self-pay

## 2021-11-24 ENCOUNTER — Encounter: Payer: Self-pay | Admitting: Internal Medicine

## 2021-11-24 ENCOUNTER — Other Ambulatory Visit: Payer: Self-pay | Admitting: Internal Medicine

## 2021-11-24 ENCOUNTER — Telehealth: Payer: Self-pay | Admitting: *Deleted

## 2021-11-24 MED ORDER — LEVETIRACETAM 500 MG PO TABS: 500.0000 mg | ORAL_TABLET | Freq: Two times a day (BID) | ORAL | 2 refills | Status: DC

## 2021-11-24 MED ORDER — LEVETIRACETAM 500 MG PO TABS
500.0000 mg | ORAL_TABLET | Freq: Two times a day (BID) | ORAL | 2 refills | Status: DC
  Filled 2021-11-24: qty 60, 30d supply, fill #0

## 2021-11-24 NOTE — Telephone Encounter (Signed)
Communicated reason for new Rx to sister [name] and she expressed understanding.  She will pick up Keppra (I rerouted to local pharmacy) and start.  She will let us know her decision after meeting with [place].

## 2021-11-24 NOTE — Telephone Encounter (Signed)
Sister [name] called to report that patient had a difficult past weekend.  Patient was unable to get out of bed without assistance. Sister arrived to find her curled up in bed and very slow to follow commands and had voided in the bed since she was unable to get out on her own.  I asked if she saw any signs of seizure activity prior and she denied it.  Patient is dosing Decadron '1mg'$  once daily as instructed.  [name] called [facility] to see if they would come out and assess her.    Received call from [place] from [place].  [phone].  She called to see if we were agreeable to patient being under hospice.    After speaking with patients sister explained that services would have to be stopped if true hospice was chosen she stated understanding.  However, [facility] has another program which can be used before hospice which is [facility].  They utilize a nurse visiting the patient in her home either every week or every other week, nurse on call, can set up equipment and DME needs as they arise.    Hospice and [name] will both report back as to what they decide to do.

## 2021-11-24 NOTE — Progress Notes (Signed)
Rerouting Keppra to [facility].

## 2021-11-25 ENCOUNTER — Telehealth: Payer: Self-pay | Admitting: Internal Medicine

## 2021-11-25 ENCOUNTER — Inpatient Hospital Stay: Payer: Medicare Other

## 2021-11-25 ENCOUNTER — Inpatient Hospital Stay: Payer: Medicare Other | Admitting: Internal Medicine

## 2021-11-25 ENCOUNTER — Telehealth: Payer: Self-pay | Admitting: *Deleted

## 2021-11-25 NOTE — Telephone Encounter (Signed)
Called per 11/16 in basket. R/s upcoming appointments w/ patient's sister.

## 2021-11-25 NOTE — Telephone Encounter (Signed)
Received call from [facility].  Patient and family decided to just proceed with Palliative [facility] at this point.

## 2021-11-26 ENCOUNTER — Other Ambulatory Visit: Payer: Self-pay

## 2021-11-29 ENCOUNTER — Other Ambulatory Visit: Payer: Self-pay

## 2021-12-01 ENCOUNTER — Telehealth: Payer: Self-pay | Admitting: *Deleted

## 2021-12-01 NOTE — Telephone Encounter (Signed)
Patient's sister called to report that patient fell out of bed on Friday of last week and went to [facility].  They gave her IV Decadron.  She started the [place] already.  She stated that after starting the [place] she was able to walk some better but then Thursday she started getting more confused and then Friday yet again.  Patient may get discharged this afternoon, maybe tomorrow and they need to reschedule her infusion.  Negative UTI and bloodwork was good.  CT head was fine.  She doesn't feel like even if she were to be discharged today that she could get her in for her visit tomorrow.  Rescheduling to next week.  Routed to Dr Mickeal Skinner to make him aware.

## 2021-12-02 ENCOUNTER — Inpatient Hospital Stay: Payer: Medicare Other

## 2021-12-02 ENCOUNTER — Inpatient Hospital Stay: Payer: Medicare Other | Admitting: Internal Medicine

## 2021-12-02 ENCOUNTER — Other Ambulatory Visit: Payer: Self-pay

## 2021-12-04 ENCOUNTER — Other Ambulatory Visit: Payer: Self-pay

## 2021-12-08 ENCOUNTER — Telehealth: Payer: Self-pay | Admitting: *Deleted

## 2021-12-08 ENCOUNTER — Inpatient Hospital Stay: Payer: Medicare Other | Attending: Internal Medicine | Admitting: Internal Medicine

## 2021-12-08 ENCOUNTER — Inpatient Hospital Stay: Payer: Medicare Other

## 2021-12-08 DIAGNOSIS — C719 Malignant neoplasm of brain, unspecified: Secondary | ICD-10-CM

## 2021-12-08 DIAGNOSIS — Z7189 Other specified counseling: Secondary | ICD-10-CM | POA: Diagnosis not present

## 2021-12-08 NOTE — Progress Notes (Signed)
I connected with Jillian Gomez on 12/08/21 at 12:00 PM EST by telephone visit and verified that I am speaking with the correct person using two identifiers.  I discussed the limitations, risks, security and privacy concerns of performing an evaluation and management service by telemedicine and the availability of in-person appointments. I also discussed with the patient that there may be a patient responsible charge related to this service. The patient expressed understanding and agreed to proceed.  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  She is no longer able to walk on her own, requiring assistance for most ADLs.  Pace of decline has been abrupt in recent days.  Palliative care and in-home nursing teams are involved providing care.   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  We recommended hospice evaluation given lack of good alternatives for systemic therapy, with poor blood counts, open wounds, proteinuria.   She and [name] will discuss it together over the next 1-2 days and get back to [place].  Otherwise we would need a brain MRI to help with next steps within treatment plan.  We will expect to hear from them later this week.  Follow Up Instructions: Per above  I discussed the assessment and treatment plan with the patient.  The patient was provided an  opportunity to ask questions and all were answered.  The patient agreed with the plan and demonstrated understanding of the instructions.    The patient was advised to call back or seek an in-person evaluation if the symptoms worsen or if the condition fails to improve as anticipated.    Ventura Sellers, MD   I provided 22 minutes of non face-to-face telephone visit time during this encounter, and > 50% was spent counseling as documented under my assessment & plan.

## 2021-12-08 NOTE — Telephone Encounter (Signed)
Patients sister called about today's appointments (lab/md/infusion).  She states that patient continues to decline.  In previous days patient has been assist X 1 to toliet but today she is max assist and would require [name]'s husband to physically lift her and put her in the car.  Patient is slightly alert/not following commands very well/sleeping more and when awake won't open eyes when talking.  Sister has to spoon feed patient.  Still on Decadron 4 mg daily plus Keppra 500 BID.  Per Dr Mickeal Skinner cancel lab and infusion appointment and convert visit to phone visit today.

## 2021-12-11 ENCOUNTER — Telehealth: Payer: Self-pay | Admitting: *Deleted

## 2021-12-11 NOTE — Telephone Encounter (Signed)
[name] called to advise that she and the Palliative care nurse met with the patient today.  Patient feels like she will survive this diagnosis and still thinks that treatment is going to cure her.  Sister said she is willing to get her here on a stretcher if treatment is going to be given and helpful.  Patient has not improved much other than one morning was able to ambulate with PT but then later in the day was unable to get off of the toilet.  [name] also asked if patient's zoloft dosage should be adjusted.  States that patient never gets emotional but during the few times that the palliative team has came by and they discussed her illness and whether or not hospice would be appropriate then the patient began crying and became very depressed and anxious.   Routing to Dr Mickeal Skinner to advise if another phone visit would be best.

## 2021-12-14 ENCOUNTER — Other Ambulatory Visit: Payer: Self-pay | Admitting: Internal Medicine

## 2021-12-15 ENCOUNTER — Telehealth: Payer: Self-pay | Admitting: *Deleted

## 2021-12-15 ENCOUNTER — Other Ambulatory Visit: Payer: Self-pay | Admitting: Internal Medicine

## 2021-12-15 DIAGNOSIS — C719 Malignant neoplasm of brain, unspecified: Secondary | ICD-10-CM

## 2021-12-15 MED ORDER — SERTRALINE HCL 25 MG PO TABS: 50.0000 mg | ORAL_TABLET | Freq: Every day | ORAL | 1 refills | Status: AC

## 2021-12-15 NOTE — Telephone Encounter (Signed)
[name] called to advise that she is going to reach out to [facility] to switch from palliative to hospice.    She also asked again about whether or not increasing zoloft was appropriate for patient.  Routed to Dr Mickeal Skinner.

## 2021-12-21 ENCOUNTER — Other Ambulatory Visit: Payer: Self-pay | Admitting: Internal Medicine

## 2022-02-05 DEATH — deceased
